# Patient Record
Sex: Female | Born: 1945 | Race: White | Hispanic: No | Smoking: Never smoker
Health system: Southern US, Community
[De-identification: ages and names within clinical notes are randomized; demographics above are authoritative.]

## PROBLEM LIST (undated history)

## (undated) DIAGNOSIS — L0390 Cellulitis, unspecified: Secondary | ICD-10-CM

## (undated) DIAGNOSIS — N189 Chronic kidney disease, unspecified: Secondary | ICD-10-CM

## (undated) DIAGNOSIS — Z8049 Family history of malignant neoplasm of other genital organs: Secondary | ICD-10-CM

## (undated) DIAGNOSIS — F419 Anxiety disorder, unspecified: Secondary | ICD-10-CM

## (undated) DIAGNOSIS — E119 Type 2 diabetes mellitus without complications: Secondary | ICD-10-CM

## (undated) DIAGNOSIS — C801 Malignant (primary) neoplasm, unspecified: Secondary | ICD-10-CM

## (undated) DIAGNOSIS — J40 Bronchitis, not specified as acute or chronic: Secondary | ICD-10-CM

## (undated) DIAGNOSIS — Z803 Family history of malignant neoplasm of breast: Secondary | ICD-10-CM

## (undated) DIAGNOSIS — I10 Essential (primary) hypertension: Secondary | ICD-10-CM

## (undated) DIAGNOSIS — D649 Anemia, unspecified: Secondary | ICD-10-CM

## (undated) DIAGNOSIS — R0600 Dyspnea, unspecified: Secondary | ICD-10-CM

## (undated) DIAGNOSIS — R011 Cardiac murmur, unspecified: Secondary | ICD-10-CM

## (undated) DIAGNOSIS — I499 Cardiac arrhythmia, unspecified: Secondary | ICD-10-CM

## (undated) DIAGNOSIS — E785 Hyperlipidemia, unspecified: Secondary | ICD-10-CM

## (undated) DIAGNOSIS — I4891 Unspecified atrial fibrillation: Secondary | ICD-10-CM

## (undated) HISTORY — DX: Family history of malignant neoplasm of other genital organs: Z80.49

## (undated) HISTORY — PX: TONSILLECTOMY AND ADENOIDECTOMY: SHX28

## (undated) HISTORY — DX: Family history of malignant neoplasm of breast: Z80.3

## (undated) HISTORY — PX: DILATION AND CURETTAGE OF UTERUS: SHX78

---

## 2004-03-18 ENCOUNTER — Ambulatory Visit (HOSPITAL_COMMUNITY): Admission: RE | Admit: 2004-03-18 | Discharge: 2004-03-18 | Payer: Self-pay | Admitting: Family Medicine

## 2011-08-24 ENCOUNTER — Ambulatory Visit: Payer: Self-pay | Admitting: Physician Assistant

## 2013-08-11 DIAGNOSIS — J40 Bronchitis, not specified as acute or chronic: Secondary | ICD-10-CM | POA: Diagnosis not present

## 2013-08-11 DIAGNOSIS — IMO0001 Reserved for inherently not codable concepts without codable children: Secondary | ICD-10-CM | POA: Diagnosis not present

## 2014-01-02 DIAGNOSIS — I10 Essential (primary) hypertension: Secondary | ICD-10-CM | POA: Diagnosis not present

## 2014-01-02 DIAGNOSIS — R609 Edema, unspecified: Secondary | ICD-10-CM | POA: Diagnosis not present

## 2014-01-02 DIAGNOSIS — J209 Acute bronchitis, unspecified: Secondary | ICD-10-CM | POA: Diagnosis not present

## 2014-01-02 DIAGNOSIS — N390 Urinary tract infection, site not specified: Secondary | ICD-10-CM | POA: Diagnosis not present

## 2014-01-02 DIAGNOSIS — IMO0001 Reserved for inherently not codable concepts without codable children: Secondary | ICD-10-CM | POA: Diagnosis not present

## 2014-01-02 DIAGNOSIS — E785 Hyperlipidemia, unspecified: Secondary | ICD-10-CM | POA: Diagnosis not present

## 2014-07-28 DIAGNOSIS — N183 Chronic kidney disease, stage 3 (moderate): Secondary | ICD-10-CM | POA: Diagnosis not present

## 2014-07-28 DIAGNOSIS — I10 Essential (primary) hypertension: Secondary | ICD-10-CM | POA: Diagnosis not present

## 2014-07-28 DIAGNOSIS — R600 Localized edema: Secondary | ICD-10-CM | POA: Diagnosis not present

## 2014-07-28 DIAGNOSIS — E1122 Type 2 diabetes mellitus with diabetic chronic kidney disease: Secondary | ICD-10-CM | POA: Diagnosis not present

## 2014-07-28 DIAGNOSIS — E780 Pure hypercholesterolemia: Secondary | ICD-10-CM | POA: Diagnosis not present

## 2014-07-28 DIAGNOSIS — E1165 Type 2 diabetes mellitus with hyperglycemia: Secondary | ICD-10-CM | POA: Diagnosis not present

## 2015-01-28 DIAGNOSIS — Z1239 Encounter for other screening for malignant neoplasm of breast: Secondary | ICD-10-CM | POA: Diagnosis not present

## 2015-01-28 DIAGNOSIS — Z1211 Encounter for screening for malignant neoplasm of colon: Secondary | ICD-10-CM | POA: Diagnosis not present

## 2015-01-28 DIAGNOSIS — E1165 Type 2 diabetes mellitus with hyperglycemia: Secondary | ICD-10-CM | POA: Diagnosis not present

## 2015-01-28 DIAGNOSIS — E780 Pure hypercholesterolemia: Secondary | ICD-10-CM | POA: Diagnosis not present

## 2015-01-28 DIAGNOSIS — I10 Essential (primary) hypertension: Secondary | ICD-10-CM | POA: Diagnosis not present

## 2015-01-28 DIAGNOSIS — N183 Chronic kidney disease, stage 3 (moderate): Secondary | ICD-10-CM | POA: Diagnosis not present

## 2015-01-28 DIAGNOSIS — Z23 Encounter for immunization: Secondary | ICD-10-CM | POA: Diagnosis not present

## 2015-03-05 ENCOUNTER — Other Ambulatory Visit (HOSPITAL_COMMUNITY): Payer: Self-pay | Admitting: Family Medicine

## 2015-03-05 DIAGNOSIS — Z1231 Encounter for screening mammogram for malignant neoplasm of breast: Secondary | ICD-10-CM

## 2015-04-16 ENCOUNTER — Ambulatory Visit (HOSPITAL_COMMUNITY)
Admission: RE | Admit: 2015-04-16 | Discharge: 2015-04-16 | Disposition: A | Discharge status: Home / Self Care | Payer: Medicare Other | Source: Ambulatory Visit | Attending: Family Medicine | Admitting: Family Medicine

## 2015-04-16 DIAGNOSIS — Z1231 Encounter for screening mammogram for malignant neoplasm of breast: Secondary | ICD-10-CM | POA: Insufficient documentation

## 2015-06-02 DIAGNOSIS — E7800 Pure hypercholesterolemia, unspecified: Secondary | ICD-10-CM | POA: Diagnosis not present

## 2015-06-02 DIAGNOSIS — E1165 Type 2 diabetes mellitus with hyperglycemia: Secondary | ICD-10-CM | POA: Diagnosis not present

## 2015-06-02 DIAGNOSIS — I10 Essential (primary) hypertension: Secondary | ICD-10-CM | POA: Diagnosis not present

## 2015-06-02 DIAGNOSIS — J209 Acute bronchitis, unspecified: Secondary | ICD-10-CM | POA: Diagnosis not present

## 2015-07-30 DIAGNOSIS — Z1211 Encounter for screening for malignant neoplasm of colon: Secondary | ICD-10-CM | POA: Diagnosis not present

## 2016-03-29 DIAGNOSIS — R600 Localized edema: Secondary | ICD-10-CM | POA: Diagnosis not present

## 2016-03-29 DIAGNOSIS — E1122 Type 2 diabetes mellitus with diabetic chronic kidney disease: Secondary | ICD-10-CM | POA: Diagnosis not present

## 2016-03-29 DIAGNOSIS — I10 Essential (primary) hypertension: Secondary | ICD-10-CM | POA: Diagnosis not present

## 2016-03-29 DIAGNOSIS — F5101 Primary insomnia: Secondary | ICD-10-CM | POA: Diagnosis not present

## 2016-03-29 DIAGNOSIS — E7800 Pure hypercholesterolemia, unspecified: Secondary | ICD-10-CM | POA: Diagnosis not present

## 2016-03-29 DIAGNOSIS — E1165 Type 2 diabetes mellitus with hyperglycemia: Secondary | ICD-10-CM | POA: Diagnosis not present

## 2016-05-18 DIAGNOSIS — J209 Acute bronchitis, unspecified: Secondary | ICD-10-CM | POA: Diagnosis not present

## 2016-05-18 DIAGNOSIS — R062 Wheezing: Secondary | ICD-10-CM | POA: Diagnosis not present

## 2016-08-17 DIAGNOSIS — E7800 Pure hypercholesterolemia, unspecified: Secondary | ICD-10-CM | POA: Diagnosis not present

## 2016-08-17 DIAGNOSIS — E1165 Type 2 diabetes mellitus with hyperglycemia: Secondary | ICD-10-CM | POA: Diagnosis not present

## 2016-08-17 DIAGNOSIS — N183 Chronic kidney disease, stage 3 (moderate): Secondary | ICD-10-CM | POA: Diagnosis not present

## 2016-08-17 DIAGNOSIS — Z7984 Long term (current) use of oral hypoglycemic drugs: Secondary | ICD-10-CM | POA: Diagnosis not present

## 2016-08-17 DIAGNOSIS — Z794 Long term (current) use of insulin: Secondary | ICD-10-CM | POA: Diagnosis not present

## 2016-08-17 DIAGNOSIS — J209 Acute bronchitis, unspecified: Secondary | ICD-10-CM | POA: Diagnosis not present

## 2016-08-17 DIAGNOSIS — I10 Essential (primary) hypertension: Secondary | ICD-10-CM | POA: Diagnosis not present

## 2016-08-18 ENCOUNTER — Ambulatory Visit
Admission: RE | Admit: 2016-08-18 | Discharge: 2016-08-18 | Disposition: A | Discharge status: Home / Self Care | Payer: Medicare Other | Source: Ambulatory Visit | Attending: Family Medicine | Admitting: Family Medicine

## 2016-08-18 ENCOUNTER — Other Ambulatory Visit: Payer: Self-pay | Admitting: Family Medicine

## 2016-08-18 DIAGNOSIS — J40 Bronchitis, not specified as acute or chronic: Secondary | ICD-10-CM

## 2016-08-18 DIAGNOSIS — R0602 Shortness of breath: Secondary | ICD-10-CM | POA: Diagnosis not present

## 2016-08-18 DIAGNOSIS — R05 Cough: Secondary | ICD-10-CM | POA: Diagnosis not present

## 2016-10-03 DIAGNOSIS — J0101 Acute recurrent maxillary sinusitis: Secondary | ICD-10-CM | POA: Diagnosis not present

## 2016-12-25 DIAGNOSIS — F5101 Primary insomnia: Secondary | ICD-10-CM | POA: Diagnosis not present

## 2016-12-25 DIAGNOSIS — E7800 Pure hypercholesterolemia, unspecified: Secondary | ICD-10-CM | POA: Diagnosis not present

## 2016-12-25 DIAGNOSIS — Z794 Long term (current) use of insulin: Secondary | ICD-10-CM | POA: Diagnosis not present

## 2016-12-25 DIAGNOSIS — E1165 Type 2 diabetes mellitus with hyperglycemia: Secondary | ICD-10-CM | POA: Diagnosis not present

## 2016-12-25 DIAGNOSIS — I10 Essential (primary) hypertension: Secondary | ICD-10-CM | POA: Diagnosis not present

## 2016-12-25 DIAGNOSIS — Z1389 Encounter for screening for other disorder: Secondary | ICD-10-CM | POA: Diagnosis not present

## 2017-03-28 DIAGNOSIS — E1165 Type 2 diabetes mellitus with hyperglycemia: Secondary | ICD-10-CM | POA: Diagnosis not present

## 2017-03-28 DIAGNOSIS — N183 Chronic kidney disease, stage 3 (moderate): Secondary | ICD-10-CM | POA: Diagnosis not present

## 2017-03-28 DIAGNOSIS — E7800 Pure hypercholesterolemia, unspecified: Secondary | ICD-10-CM | POA: Diagnosis not present

## 2017-03-28 DIAGNOSIS — I10 Essential (primary) hypertension: Secondary | ICD-10-CM | POA: Diagnosis not present

## 2017-03-28 DIAGNOSIS — F5101 Primary insomnia: Secondary | ICD-10-CM | POA: Diagnosis not present

## 2017-03-28 DIAGNOSIS — Z1211 Encounter for screening for malignant neoplasm of colon: Secondary | ICD-10-CM | POA: Diagnosis not present

## 2017-03-28 DIAGNOSIS — R600 Localized edema: Secondary | ICD-10-CM | POA: Diagnosis not present

## 2017-06-01 DIAGNOSIS — Z23 Encounter for immunization: Secondary | ICD-10-CM | POA: Diagnosis not present

## 2017-12-28 DIAGNOSIS — E7800 Pure hypercholesterolemia, unspecified: Secondary | ICD-10-CM | POA: Diagnosis not present

## 2017-12-28 DIAGNOSIS — E1165 Type 2 diabetes mellitus with hyperglycemia: Secondary | ICD-10-CM | POA: Diagnosis not present

## 2017-12-28 DIAGNOSIS — I10 Essential (primary) hypertension: Secondary | ICD-10-CM | POA: Diagnosis not present

## 2017-12-28 DIAGNOSIS — R600 Localized edema: Secondary | ICD-10-CM | POA: Diagnosis not present

## 2017-12-28 DIAGNOSIS — E1122 Type 2 diabetes mellitus with diabetic chronic kidney disease: Secondary | ICD-10-CM | POA: Diagnosis not present

## 2017-12-28 DIAGNOSIS — F5101 Primary insomnia: Secondary | ICD-10-CM | POA: Diagnosis not present

## 2017-12-28 DIAGNOSIS — N183 Chronic kidney disease, stage 3 (moderate): Secondary | ICD-10-CM | POA: Diagnosis not present

## 2018-02-13 ENCOUNTER — Inpatient Hospital Stay (HOSPITAL_COMMUNITY)
Admission: EM | Admit: 2018-02-13 | Discharge: 2018-02-15 | DRG: 603 | Disposition: A | Discharge status: Home / Self Care | Payer: Medicare Other | Attending: Internal Medicine | Admitting: Internal Medicine

## 2018-02-13 ENCOUNTER — Inpatient Hospital Stay (HOSPITAL_COMMUNITY): Payer: Medicare Other

## 2018-02-13 ENCOUNTER — Other Ambulatory Visit: Payer: Self-pay

## 2018-02-13 ENCOUNTER — Encounter (HOSPITAL_COMMUNITY): Payer: Self-pay

## 2018-02-13 DIAGNOSIS — E66813 Obesity, class 3: Secondary | ICD-10-CM

## 2018-02-13 DIAGNOSIS — Z885 Allergy status to narcotic agent status: Secondary | ICD-10-CM

## 2018-02-13 DIAGNOSIS — I272 Pulmonary hypertension, unspecified: Secondary | ICD-10-CM | POA: Diagnosis present

## 2018-02-13 DIAGNOSIS — Z6841 Body Mass Index (BMI) 40.0 and over, adult: Secondary | ICD-10-CM | POA: Diagnosis not present

## 2018-02-13 DIAGNOSIS — R000 Tachycardia, unspecified: Secondary | ICD-10-CM | POA: Diagnosis not present

## 2018-02-13 DIAGNOSIS — I4891 Unspecified atrial fibrillation: Secondary | ICD-10-CM | POA: Diagnosis present

## 2018-02-13 DIAGNOSIS — Z8249 Family history of ischemic heart disease and other diseases of the circulatory system: Secondary | ICD-10-CM | POA: Diagnosis not present

## 2018-02-13 DIAGNOSIS — I10 Essential (primary) hypertension: Secondary | ICD-10-CM | POA: Diagnosis not present

## 2018-02-13 DIAGNOSIS — Z7189 Other specified counseling: Secondary | ICD-10-CM | POA: Diagnosis not present

## 2018-02-13 DIAGNOSIS — L03115 Cellulitis of right lower limb: Principal | ICD-10-CM | POA: Diagnosis present

## 2018-02-13 DIAGNOSIS — I119 Hypertensive heart disease without heart failure: Secondary | ICD-10-CM | POA: Diagnosis present

## 2018-02-13 DIAGNOSIS — X58XXXA Exposure to other specified factors, initial encounter: Secondary | ICD-10-CM | POA: Diagnosis present

## 2018-02-13 DIAGNOSIS — Z794 Long term (current) use of insulin: Secondary | ICD-10-CM

## 2018-02-13 DIAGNOSIS — I340 Nonrheumatic mitral (valve) insufficiency: Secondary | ICD-10-CM

## 2018-02-13 DIAGNOSIS — R600 Localized edema: Secondary | ICD-10-CM

## 2018-02-13 DIAGNOSIS — E876 Hypokalemia: Secondary | ICD-10-CM | POA: Diagnosis present

## 2018-02-13 DIAGNOSIS — Z881 Allergy status to other antibiotic agents status: Secondary | ICD-10-CM | POA: Diagnosis not present

## 2018-02-13 DIAGNOSIS — E785 Hyperlipidemia, unspecified: Secondary | ICD-10-CM

## 2018-02-13 DIAGNOSIS — E782 Mixed hyperlipidemia: Secondary | ICD-10-CM | POA: Diagnosis not present

## 2018-02-13 DIAGNOSIS — Z79899 Other long term (current) drug therapy: Secondary | ICD-10-CM

## 2018-02-13 DIAGNOSIS — E871 Hypo-osmolality and hyponatremia: Secondary | ICD-10-CM | POA: Diagnosis present

## 2018-02-13 DIAGNOSIS — L03119 Cellulitis of unspecified part of limb: Secondary | ICD-10-CM | POA: Diagnosis present

## 2018-02-13 DIAGNOSIS — E1165 Type 2 diabetes mellitus with hyperglycemia: Secondary | ICD-10-CM | POA: Diagnosis present

## 2018-02-13 DIAGNOSIS — T502X5A Adverse effect of carbonic-anhydrase inhibitors, benzothiadiazides and other diuretics, initial encounter: Secondary | ICD-10-CM | POA: Diagnosis present

## 2018-02-13 HISTORY — DX: Hyperlipidemia, unspecified: E78.5

## 2018-02-13 HISTORY — DX: Essential (primary) hypertension: I10

## 2018-02-13 HISTORY — DX: Unspecified atrial fibrillation: I48.91

## 2018-02-13 HISTORY — DX: Cellulitis, unspecified: L03.90

## 2018-02-13 HISTORY — DX: Type 2 diabetes mellitus without complications: E11.9

## 2018-02-13 LAB — COMPREHENSIVE METABOLIC PANEL
ALT: 19 U/L (ref 0–44)
AST: 18 U/L (ref 15–41)
Albumin: 2.6 g/dL — ABNORMAL LOW (ref 3.5–5.0)
Alkaline Phosphatase: 142 U/L — ABNORMAL HIGH (ref 38–126)
Anion gap: 12 (ref 5–15)
BUN: 42 mg/dL — ABNORMAL HIGH (ref 8–23)
CHLORIDE: 94 mmol/L — AB (ref 98–111)
CO2: 25 mmol/L (ref 22–32)
Calcium: 8.4 mg/dL — ABNORMAL LOW (ref 8.9–10.3)
Creatinine, Ser: 1.25 mg/dL — ABNORMAL HIGH (ref 0.44–1.00)
GFR, EST AFRICAN AMERICAN: 50 mL/min — AB (ref >60)
GFR, EST NON AFRICAN AMERICAN: 43 mL/min — AB (ref >60)
Glucose, Bld: 323 mg/dL — ABNORMAL HIGH (ref 70–99)
POTASSIUM: 3.5 mmol/L (ref 3.5–5.1)
SODIUM: 131 mmol/L — AB (ref 135–145)
Total Bilirubin: 0.7 mg/dL (ref 0.3–1.2)
Total Protein: 6.6 g/dL (ref 6.5–8.1)

## 2018-02-13 LAB — CBC WITH DIFFERENTIAL/PLATELET
Basophils Absolute: 0 10*3/uL (ref 0.0–0.1)
Basophils Relative: 0 %
Eosinophils Absolute: 0 10*3/uL (ref 0.0–0.7)
Eosinophils Relative: 0 %
HEMATOCRIT: 32.8 % — AB (ref 36.0–46.0)
HEMOGLOBIN: 10.7 g/dL — AB (ref 12.0–15.0)
LYMPHS ABS: 0.9 10*3/uL (ref 0.7–4.0)
LYMPHS PCT: 4 %
MCH: 25.8 pg — AB (ref 26.0–34.0)
MCHC: 32.6 g/dL (ref 30.0–36.0)
MCV: 79 fL (ref 78.0–100.0)
MONOS PCT: 7 %
Monocytes Absolute: 1.6 10*3/uL — ABNORMAL HIGH (ref 0.1–1.0)
NEUTROS ABS: 20.8 10*3/uL — AB (ref 1.7–7.7)
NEUTROS PCT: 89 %
Platelets: 216 10*3/uL (ref 150–400)
RBC: 4.15 MIL/uL (ref 3.87–5.11)
RDW: 14.4 % (ref 11.5–15.5)
WBC: 23.3 10*3/uL — AB (ref 4.0–10.5)

## 2018-02-13 LAB — CK: CK TOTAL: 22 U/L — AB (ref 38–234)

## 2018-02-13 LAB — URINALYSIS, ROUTINE W REFLEX MICROSCOPIC
BILIRUBIN URINE: NEGATIVE
Glucose, UA: 150 mg/dL — AB
KETONES UR: NEGATIVE mg/dL
Nitrite: NEGATIVE
Protein, ur: 100 mg/dL — AB
SPECIFIC GRAVITY, URINE: 1.018 (ref 1.005–1.030)
pH: 5 (ref 5.0–8.0)

## 2018-02-13 LAB — GLUCOSE, CAPILLARY
GLUCOSE-CAPILLARY: 285 mg/dL — AB (ref 70–99)
GLUCOSE-CAPILLARY: 254 mg/dL — AB (ref 70–99)
Glucose-Capillary: 306 mg/dL — ABNORMAL HIGH (ref 70–99)

## 2018-02-13 LAB — ECHOCARDIOGRAM COMPLETE
Height: 66 in
WEIGHTICAEL: 4752 [oz_av]

## 2018-02-13 LAB — HEMOGLOBIN A1C
Hgb A1c MFr Bld: 9.7 % — ABNORMAL HIGH (ref 4.8–5.6)
Mean Plasma Glucose: 231.69 mg/dL

## 2018-02-13 LAB — LACTIC ACID, PLASMA
LACTIC ACID, VENOUS: 1.7 mmol/L (ref 0.5–1.9)
Lactic Acid, Venous: 1.5 mmol/L (ref 0.5–1.9)

## 2018-02-13 MED ORDER — POTASSIUM CHLORIDE IN NACL 20-0.9 MEQ/L-% IV SOLN
INTRAVENOUS | Status: DC
  Administered 2018-02-13 – 2018-02-14 (×2): via INTRAVENOUS

## 2018-02-13 MED ORDER — LISINOPRIL 10 MG PO TABS
20.0000 mg | ORAL_TABLET | Freq: Every day | ORAL | Status: DC
  Administered 2018-02-13 – 2018-02-15 (×3): 20 mg via ORAL
  Filled 2018-02-13 (×3): qty 2

## 2018-02-13 MED ORDER — INSULIN ASPART 100 UNIT/ML ~~LOC~~ SOLN
0.0000 [IU] | Freq: Three times a day (TID) | SUBCUTANEOUS | Status: DC
  Administered 2018-02-13: 11 [IU] via SUBCUTANEOUS
  Administered 2018-02-13: 8 [IU] via SUBCUTANEOUS

## 2018-02-13 MED ORDER — CEFAZOLIN SODIUM-DEXTROSE 1-4 GM/50ML-% IV SOLN
1.0000 g | Freq: Once | INTRAVENOUS | Status: AC
  Administered 2018-02-13: 1 g via INTRAVENOUS
  Filled 2018-02-13: qty 50

## 2018-02-13 MED ORDER — ACETAMINOPHEN 650 MG RE SUPP: 650.0000 mg | Freq: Four times a day (QID) | RECTAL | Status: DC | PRN

## 2018-02-13 MED ORDER — DILTIAZEM HCL 30 MG PO TABS
30.0000 mg | ORAL_TABLET | Freq: Four times a day (QID) | ORAL | Status: DC
  Administered 2018-02-13 – 2018-02-14 (×4): 30 mg via ORAL
  Filled 2018-02-13 (×4): qty 1

## 2018-02-13 MED ORDER — PRAVASTATIN SODIUM 10 MG PO TABS
20.0000 mg | ORAL_TABLET | Freq: Every day | ORAL | Status: DC
  Administered 2018-02-13 – 2018-02-15 (×3): 20 mg via ORAL
  Filled 2018-02-13 (×3): qty 2

## 2018-02-13 MED ORDER — INSULIN DETEMIR 100 UNIT/ML ~~LOC~~ SOLN
10.0000 [IU] | Freq: Every day | SUBCUTANEOUS | Status: DC
  Administered 2018-02-13: 10 [IU] via SUBCUTANEOUS
  Filled 2018-02-13: qty 0.1

## 2018-02-13 MED ORDER — ACETAMINOPHEN 325 MG PO TABS
650.0000 mg | ORAL_TABLET | Freq: Four times a day (QID) | ORAL | Status: DC | PRN
  Administered 2018-02-13 – 2018-02-15 (×7): 650 mg via ORAL
  Filled 2018-02-13 (×7): qty 2

## 2018-02-13 MED ORDER — ONDANSETRON HCL 4 MG PO TABS: 4.0000 mg | ORAL_TABLET | Freq: Four times a day (QID) | ORAL | Status: DC | PRN

## 2018-02-13 MED ORDER — ENOXAPARIN SODIUM 40 MG/0.4ML ~~LOC~~ SOLN
40.0000 mg | SUBCUTANEOUS | Status: DC
  Administered 2018-02-13: 40 mg via SUBCUTANEOUS
  Filled 2018-02-13: qty 0.4

## 2018-02-13 MED ORDER — INSULIN ASPART 100 UNIT/ML ~~LOC~~ SOLN
0.0000 [IU] | Freq: Every day | SUBCUTANEOUS | Status: DC
  Administered 2018-02-13: 3 [IU] via SUBCUTANEOUS

## 2018-02-13 MED ORDER — METOPROLOL TARTRATE 25 MG PO TABS
25.0000 mg | ORAL_TABLET | Freq: Two times a day (BID) | ORAL | Status: DC
  Administered 2018-02-13 – 2018-02-14 (×3): 25 mg via ORAL
  Filled 2018-02-13 (×3): qty 1

## 2018-02-13 MED ORDER — ONDANSETRON HCL 4 MG/2ML IJ SOLN: 4.0000 mg | Freq: Four times a day (QID) | INTRAMUSCULAR | Status: DC | PRN

## 2018-02-13 MED ORDER — CEFAZOLIN SODIUM-DEXTROSE 2-4 GM/100ML-% IV SOLN
2.0000 g | Freq: Three times a day (TID) | INTRAVENOUS | Status: DC
  Administered 2018-02-13 – 2018-02-15 (×7): 2 g via INTRAVENOUS
  Filled 2018-02-13 (×16): qty 100

## 2018-02-13 MED ORDER — SODIUM CHLORIDE 0.9 % IV BOLUS
1000.0000 mL | Freq: Once | INTRAVENOUS | Status: AC
  Administered 2018-02-13: 1000 mL via INTRAVENOUS

## 2018-02-13 NOTE — Progress Notes (Signed)
*  PRELIMINARY RESULTS* Echocardiogram 2D Echocardiogram has been performed.  Destiny Nash 02/13/2018, 2:17 PM

## 2018-02-13 NOTE — ED Triage Notes (Signed)
Pt family called EMS for cellulitis as well as an assisted fall due to leg weakness. Pt wanted to wait until the morning to see PCP but family urged her to come in. VSS otherwise  20g r ac

## 2018-02-13 NOTE — ED Provider Notes (Signed)
MSE was initiated and I personally evaluated the patient and placed orders (if any) at  6:17 AM on February 13, 2018.  The patient appears stable so that the remainder of the MSE may be completed by another provider.  I was not able to examine the patient well because as soon as I went in the room she had to use the bathroom and could not wait.  However she has an obviously red and inflamed right lower leg.  Initial laboratory testing was done.     Rolland Porter, MD 02/13/18 [phone]

## 2018-02-13 NOTE — ED Provider Notes (Signed)
[facility] EMERGENCY DEPARTMENT Provider Note   CSN: [id number] Arrival date & time: 02/13/18  0411     History   Chief Complaint Chief Complaint  Patient presents with  . Recurrent Skin Infections    Bilat legs    HPI Destiny Nash is a 72 y.o. female.     Pt was seen at [place]. Per pt and her family, c/o gradual onset and worsening of persistent RLE erythema for the past 6 days. Pt describes this as an acute flair of her chronic "cellulitis." Has been associated with RLE acute flair of chronic "weakness." Pt and her family state pt's "leg gets red, then gets weak" and this has been ongoing/waxing and waning for the past several years. Pt states she usually receives antibiotics by her PMD with improvement. Pt states she fell to her knees this morning due to her symptoms, and was able to stand up with her walker and EMS assistance. Denies fevers, no abd pain, no N/V/D, no neck or back pain, no headache, no syncope, no tingling/numbness in extremities, no new focal motor weakness.   Past Medical History:  Diagnosis Date  . Cellulitis   . Diabetes mellitus without complication ([place])   . Hyperlipidemia   . Hypertension     There are no active problems to display for this patient.   History reviewed. No pertinent surgical history.   OB History   None      Home Medications    Prior to Admission medications   Medication Sig Start Date End Date Taking? Authorizing Provider  doxycycline (VIBRA-TABS) 100 MG tablet Take 100 mg by mouth 2 (two) times daily. for 10 days 02/08/18   [provider]  glyBURIDE (DIABETA) 5 MG tablet Take 10 mg by mouth 2 (two) times daily. 01/22/18   [provider]  lisinopril-hydrochlorothiazide (PRINZIDE,ZESTORETIC) 20-12.5 MG tablet Take 1 tablet by mouth daily. 01/22/18   [provider]  metoprolol tartrate (LOPRESSOR) 25 MG tablet Take 25 mg by mouth 2 (two) times daily with a meal. 11/30/17   [provider]    pravastatin (PRAVACHOL) 20 MG tablet TK 1 T PO QD 01/03/18   [provider]    Family History History reviewed. No pertinent family history.  Social History Social History   Tobacco Use  . Smoking status: Never Smoker  . Smokeless tobacco: Never Used  Substance Use Topics  . Alcohol use: Not Currently  . Drug use: Not Currently     Allergies   Patient has no allergy information on record.   Review of Systems Review of Systems ROS: Statement: All systems negative except as marked or noted in the HPI; Constitutional: Negative for fever and chills. ; ; Eyes: Negative for eye pain, redness and discharge. ; ; ENMT: Negative for ear pain, hoarseness, nasal congestion, sinus pressure and sore throat. ; ; Cardiovascular: Negative for chest pain, palpitations, diaphoresis, dyspnea and peripheral edema. ; ; Respiratory: Negative for cough, wheezing and stridor. ; ; Gastrointestinal: Negative for nausea, vomiting, diarrhea, abdominal pain, blood in stool, hematemesis, jaundice and rectal bleeding. . ; ; Genitourinary: Negative for dysuria, flank pain and hematuria. ; ; Musculoskeletal: Negative for back pain and neck pain. Negative for swelling and trauma.; ; Skin: +rash. Negative for pruritus, abrasions, blisters, bruising and skin lesion.; ; Neuro: Negative for headache, lightheadedness and neck stiffness. Negative for weakness, altered level of consciousness, altered mental status, new/different extremity weakness, paresthesias, involuntary movement, seizure and syncope.  Physical Exam Updated Vital Signs BP (!) 144/77   Pulse (!) 108   Temp 98.3 F (36.8 C) (Oral)   Resp 20   Ht 5' 6"  (1.676 m)   Wt 134.7 kg   SpO2 98%   BMI 47.94 kg/m    BP (!) 144/77   Pulse (!) 108   Temp 98.3 F (36.8 C) (Oral)   Resp 20   Ht 5' 6"  (1.676 m)   Wt 134.7 kg   SpO2 98%   BMI 47.94 kg/m    Physical Exam 0720: Physical examination:  Nursing notes reviewed; Vital signs and  O2 SAT reviewed;  Constitutional: Well developed, Well nourished, Well hydrated, In no acute distress; Head:  Normocephalic, atraumatic; Eyes: EOMI, PERRL, No scleral icterus; ENMT: Mouth and pharynx normal, Mucous membranes moist; Neck: Supple, Full range of motion, No lymphadenopathy; Cardiovascular: Regular rate and rhythm, No gallop; Respiratory: Breath sounds clear & equal bilaterally, No wheezes.  Speaking full sentences with ease, Normal respiratory effort/excursion; Chest: Nontender, Movement normal; Abdomen: Soft, Large pannus. Nontender, Nondistended, Normal bowel sounds; Genitourinary: No CVA tenderness; Spine:  No midline CS, TS, LS tenderness.;; Extremities: Peripheral pulses normal, No tenderness, +2 pedal edema bilat. +right lower leg mid-tibial area erythema and induration with streaking up posterior leg. No open wounds. ; Neuro: AA&Ox3, Major CN grossly intact.  Speech clear. No gross focal motor or sensory deficits in extremities.; Skin: Color normal, Warm, Dry.   ED Treatments / Results  Labs (all labs ordered are listed, but only abnormal results are displayed)   EKG None  Radiology   Procedures Procedures (including critical care time)  Medications Ordered in ED Medications  sodium chloride 0.9 % bolus 1,000 mL (0 mLs Intravenous Stopped 02/13/18 0754)  ceFAZolin (ANCEF) IVPB 1 g/50 mL premix (0 g Intravenous Stopped 02/13/18 0714)     Initial Impression / Assessment and Plan / ED Course  I have reviewed the triage vital signs and the nursing notes.  Pertinent labs & imaging results that were available during my care of the patient were reviewed by me and considered in my medical decision making (see chart for details).  MDM Reviewed: previous chart, nursing note and vitals Reviewed previous: labs Interpretation: labs   Results for orders placed or performed during the hospital encounter of 02/13/18  Culture, blood (routine x 2)  Result Value Ref Range    Specimen Description BLOOD RIGHT ARM    Special Requests      BOTTLES DRAWN AEROBIC AND ANAEROBIC Blood Culture adequate volume Performed at [facility], [address], [place], [place] [address]    Culture PENDING    Report Status PENDING   Culture, blood (routine x 2)  Result Value Ref Range   Specimen Description BLOOD LEFT ARM    Special Requests      BOTTLES DRAWN AEROBIC ONLY Blood Culture adequate volume Performed at [address] [place], [place] [address]    Culture PENDING    Report Status PENDING   Comprehensive metabolic panel  Result Value Ref Range   Sodium 131 (L) 135 - 145 mmol/L   Potassium 3.5 3.5 - 5.1 mmol/L   Chloride 94 (L) 98 - 111 mmol/L   CO2 25 22 - 32 mmol/L   Glucose, Bld 323 (H) 70 - 99 mg/dL   BUN 42 (H) 8 - 23 mg/dL   Creatinine, Ser 1.25 (H) 0.44 - 1.00 mg/dL   Calcium 8.4 (L) 8.9 - 10.3 mg/dL  Total Protein 6.6 6.5 - 8.1 g/dL   Albumin 2.6 (L) 3.5 - 5.0 g/dL   AST 18 15 - 41 U/L   ALT 19 0 - 44 U/L   Alkaline Phosphatase 142 (H) 38 - 126 U/L   Total Bilirubin 0.7 0.3 - 1.2 mg/dL   GFR calc non Af Amer 43 (L) >60 mL/min   GFR calc Af Amer 50 (L) >60 mL/min   Anion gap 12 5 - 15  CBC with Differential  Result Value Ref Range   WBC 23.3 (H) 4.0 - 10.5 K/uL   RBC 4.15 3.87 - 5.11 MIL/uL   Hemoglobin 10.7 (L) 12.0 - 15.0 g/dL   HCT 32.8 (L) 36.0 - 46.0 %   MCV 79.0 78.0 - 100.0 fL   MCH 25.8 (L) 26.0 - 34.0 pg   MCHC 32.6 30.0 - 36.0 g/dL   RDW 14.4 11.5 - 15.5 %   Platelets 216 150 - 400 K/uL   Neutrophils Relative % 89 %   Neutro Abs 20.8 (H) 1.7 - 7.7 K/uL   Lymphocytes Relative 4 %   Lymphs Abs 0.9 0.7 - 4.0 K/uL   Monocytes Relative 7 %   Monocytes Absolute 1.6 (H) 0.1 - 1.0 K/uL   Eosinophils Relative 0 %   Eosinophils Absolute 0.0 0.0 - 0.7 K/uL   Basophils Relative 0 %   Basophils Absolute 0.0 0.0 - 0.1 K/uL  Lactic acid, plasma  Result Value Ref Range   Lactic Acid, Venous 1.7 0.5 - 1.9 mmol/L    0820:   BUN/Cr elevated, no old to compare. CBG elevated but AG normal. IV abx started after [facility] x2. Dx and testing d/w pt and family.  Questions answered.  Verb understanding, agreeable to admit.  T/C returned from [facility]. Carles Collet, case discussed, including:  HPI, pertinent PM/SHx, VS/PE, dx testing, ED course and treatment:  Agreeable to admit.     Final Clinical Impressions(s) / ED Diagnoses   Final diagnoses:  None    ED Discharge Orders    None       Francine Graven, DO 02/17/18 1313

## 2018-02-13 NOTE — Progress Notes (Signed)
Personally reviewed EKG -Afib with HR 109 with nonspecific T wave changes -place on tele -check Echo -check TSH, Free T4 -start diltiazem -with new diagnosis of Afib-->consult cardiology -CHADSVASc at least 4  DTat

## 2018-02-13 NOTE — ED Triage Notes (Signed)
Pt noticed the cellulitis Friday morning.

## 2018-02-13 NOTE — ED Notes (Signed)
Pt took home tylenol

## 2018-02-13 NOTE — Progress Notes (Addendum)
Patient admitted from ED to room 307, report received from  [facility] RN. Patient alert and oriented, MD aware of patient's arrival to floor. Placed on telemetry box 18. Family at bedside .Will continue to monitor patient.

## 2018-02-13 NOTE — H&P (Signed)
History and Physical  Destiny Nash [id number] DOB: 11-Mar-1946 DOA: 02/13/2018   PCP: [name], MD   Patient coming from: Home  Chief Complaint: leg pain  HPI:  Destiny Nash is a 72 y.o. female with medical history of HTN, DM2, HLD presented with right leg pain, erythema and edema since 02/07/18.  She denies any recent injury or trauma to the leg, although she has felt some heaviness in the right leg resulting into mechanical falls since 02/07/2018.  The patient has some subjective fevers and chills on 02/07/2018.  The patient called her primary care provider on 02/08/2018, and she was called in a prescription for doxycycline which she has been taking uppercase for this point.  She has had some nausea without emesis patient denies any chest pain, shortness breath, cough, hemoptysis, vomiting, diarrhea, dysuria, hematuria.  The patient states that she has cellulitis in the right lower extremity approximately 2 years prior to this admission, but did not require admission at that time.  Since starting doxycycline on 02/08/2018, she has noted continued erythema streaking up the posterior aspect of her calf to her thigh.  As result, she came to the emergency department for further evaluation. In the emergency department, the patient was afebrile hemodynamically stable saturating 100% room air.  BMP showed a sodium 131, potassium 3.5, serum creatinine 1.25.  WBC was 23.3.  Chest x-ray shows small bilateral pleural effusions, without infiltrates.  Lactic acid was 1.7.  Patient was started on cefazolin.  Assessment/Plan: Cellulitis right lower extremity -The patient has failed outpatient antibiotics -Continue cefazolin IV -Right lower extremity duplex -Check CPK  Diabetes mellitus type 2, uncontrolled with hyperglycemia -Holding glyburide and metformin -Continue reduced dose Levemir -Check hemoglobin A1c -NovoLog sliding scale  Essential hypertension -Continue lisinopril and  metoprolol tartrate -Holding HCTZ secondary to hyponatremia  Hyponatremia -Likely due to HCTZ -IV fluids x24 hours  Hyperlipidemia -continue statin  Morbid obesity -BMI 47.94 -Lifestyle modification      Past Medical History:  Diagnosis Date  . Cellulitis   . Diabetes mellitus without complication ([place])   . Hyperlipidemia   . Hypertension   Surgical History reviewed. No pertinent surgical history. Social History:  reports that she has never smoked. She has never used smokeless tobacco. She reports that she drank alcohol. She reports that she has current or past drug history.  Family History reviewed. No pertinent family history.     Prior to Admission medications   Medication Sig Start Date End Date Taking? Authorizing Provider  doxycycline (VIBRA-TABS) 100 MG tablet Take 100 mg by mouth 2 (two) times daily. for 10 days 02/08/18   [provider]  glyBURIDE (DIABETA) 5 MG tablet Take 10 mg by mouth 2 (two) times daily. 01/22/18   [provider]  lisinopril-hydrochlorothiazide (PRINZIDE,ZESTORETIC) 20-12.5 MG tablet Take 1 tablet by mouth daily. 01/22/18   [provider]  metoprolol tartrate (LOPRESSOR) 25 MG tablet Take 25 mg by mouth 2 (two) times daily with a meal. 11/30/17   [provider]  pravastatin (PRAVACHOL) 20 MG tablet TK 1 T PO QD 01/03/18   [provider]    Review of Systems:  Constitutional:  No weight loss, night sweats, Fevers, chills, fatigue.  Head&Eyes: No headache.  No vision loss.  No eye pain or scotoma ENT:  No Difficulty swallowing,Tooth/dental problems,Sore throat,  No ear ache, post nasal drip,  Cardio-vascular:  No chest pain, Orthopnea, PND, swelling in lower extremities,  dizziness, palpitations  GI:  No  abdominal pain, nausea, vomiting, diarrhea, loss of appetite, hematochezia, melena, heartburn, indigestion, Resp:  No shortness of breath with exertion or at rest. No cough. No coughing up  of blood .No wheezing.No chest wall deformity  Skin:  no rash or lesions.  GU:  no dysuria, change in color of urine, no urgency or frequency. No flank pain.  Musculoskeletal:  No joint pain or swelling. No decreased range of motion. No back pain.  Psych:  No change in mood or affect. No depression or anxiety. Neurologic: No headache, no dysesthesia, no focal weakness, no vision loss. No syncope  Physical Exam: Vitals:   02/13/18 0430 02/13/18 0500 02/13/18 0530 02/13/18 0600  BP: (!) 144/63 (!) 149/86 (!) 153/83 (!) 144/77  Pulse:      Resp:      Temp:      TempSrc:      SpO2:      Weight:      Height:       General:  A&O x 3, NAD, nontoxic, pleasant/cooperative Head/Eye: No conjunctival hemorrhage, no icterus, [place]/AT, No nystagmus ENT:  No icterus,  No thrush, good dentition, no pharyngeal exudate Neck:  No masses, no lymphadenpathy, no bruits CV:  irregular, no rub, no gallop, no S3 Lung:  CTAB, good air movement, no wheeze, no rhonchi Abdomen: soft/NT, +BS, nondistended, no peritoneal signs Ext: No cyanosis, No rashes, No petechiae,3 + RLE edema with erythema in pretibial area streaking up to posterior thigh           Labs on Admission:  Basic Metabolic Panel: Recent Labs  Lab 02/13/18 0650  NA 131*  K 3.5  CL 94*  CO2 25  GLUCOSE 323*  BUN 42*  CREATININE 1.25*  CALCIUM 8.4*   Liver Function Tests: Recent Labs  Lab 02/13/18 0650  AST 18  ALT 19  ALKPHOS 142*  BILITOT 0.7  PROT 6.6  ALBUMIN 2.6*   No results for input(s): LIPASE, AMYLASE in the last 168 hours. No results for input(s): AMMONIA in the last 168 hours. CBC: Recent Labs  Lab 02/13/18 0650  WBC 23.3*  NEUTROABS 20.8*  HGB 10.7*  HCT 32.8*  MCV 79.0  PLT 216   Coagulation Profile: No results for input(s): INR, PROTIME in the last 168 hours. Cardiac Enzymes: No results for input(s): CKTOTAL, CKMB, CKMBINDEX, TROPONINI in the last 168 hours. BNP: Invalid input(s):  POCBNP CBG: No results for input(s): GLUCAP in the last 168 hours. Urine analysis: No results found for: COLORURINE, APPEARANCEUR, LABSPEC, PHURINE, GLUCOSEU, HGBUR, BILIRUBINUR, [place], [place], UROBILINOGEN, NITRITE, LEUKOCYTESUR Sepsis Labs: @LABRCNTIP (procalcitonin:4,lacticidven:4) ) Recent Results (from the past 240 hour(s))  Culture, blood (routine x 2)     Status: None (Preliminary result)   Collection Time: 02/13/18  6:50 AM  Result Value Ref Range Status   Specimen Description BLOOD RIGHT ARM  Final   Special Requests   Final    BOTTLES DRAWN AEROBIC AND ANAEROBIC Blood Culture adequate volume Performed at [facility], [address], [place], [place] [address]    Culture PENDING  Incomplete   Report Status PENDING  Incomplete  Culture, blood (routine x 2)     Status: None (Preliminary result)   Collection Time: 02/13/18  6:50 AM  Result Value Ref Range Status   Specimen Description BLOOD LEFT ARM  Final   Special Requests   Final    BOTTLES DRAWN AEROBIC ONLY Blood Culture adequate volume Performed at [facility], [address],  [place], [place] [address]    Culture PENDING  Incomplete   Report Status PENDING  Incomplete     Radiological Exams on Admission: No results found.  EKG: Independently reviewed. pending    Time spent:60 minutes Code Status:   FULL Family Communication:  Daughter at bedside Disposition Plan: expect 2-3 day hospitalization Consults called: none DVT Prophylaxis: [place] Heparin    Orson Eva, DO  [facility] Pager [phone]  If 7PM-7AM, please contact night-coverage [email or web address] Password [email or web address] 02/13/2018, 8:30 AM

## 2018-02-13 NOTE — ED Notes (Signed)
Pt cannot provide urine sample at this time.

## 2018-02-14 ENCOUNTER — Encounter (HOSPITAL_COMMUNITY): Payer: Self-pay | Admitting: Student

## 2018-02-14 ENCOUNTER — Inpatient Hospital Stay (HOSPITAL_COMMUNITY): Payer: Medicare Other

## 2018-02-14 DIAGNOSIS — I4891 Unspecified atrial fibrillation: Secondary | ICD-10-CM

## 2018-02-14 LAB — BASIC METABOLIC PANEL
ANION GAP: 10 (ref 5–15)
BUN: 34 mg/dL — ABNORMAL HIGH (ref 8–23)
CALCIUM: 8.4 mg/dL — AB (ref 8.9–10.3)
CO2: 24 mmol/L (ref 22–32)
Chloride: 101 mmol/L (ref 98–111)
Creatinine, Ser: 0.92 mg/dL (ref 0.44–1.00)
GFR calc Af Amer: >60 mL/min (ref >60)
GLUCOSE: 183 mg/dL — AB (ref 70–99)
Potassium: 3.5 mmol/L (ref 3.5–5.1)
Sodium: 135 mmol/L (ref 135–145)

## 2018-02-14 LAB — T4, FREE: Free T4: 1.66 ng/dL (ref 0.82–1.77)

## 2018-02-14 LAB — GLUCOSE, CAPILLARY
GLUCOSE-CAPILLARY: 182 mg/dL — AB (ref 70–99)
Glucose-Capillary: 201 mg/dL — ABNORMAL HIGH (ref 70–99)
Glucose-Capillary: 163 mg/dL — ABNORMAL HIGH (ref 70–99)
Glucose-Capillary: 200 mg/dL — ABNORMAL HIGH (ref 70–99)

## 2018-02-14 LAB — TSH: TSH: 1.862 u[IU]/mL (ref 0.350–4.500)

## 2018-02-14 LAB — CBC
HEMATOCRIT: 32.8 % — AB (ref 36.0–46.0)
Hemoglobin: 10.7 g/dL — ABNORMAL LOW (ref 12.0–15.0)
MCH: 25.7 pg — ABNORMAL LOW (ref 26.0–34.0)
MCHC: 32.6 g/dL (ref 30.0–36.0)
MCV: 78.8 fL (ref 78.0–100.0)
Platelets: 235 10*3/uL (ref 150–400)
RBC: 4.16 MIL/uL (ref 3.87–5.11)
RDW: 14.7 % (ref 11.5–15.5)
WBC: 18.6 10*3/uL — ABNORMAL HIGH (ref 4.0–10.5)

## 2018-02-14 LAB — MAGNESIUM: Magnesium: 1.6 mg/dL — ABNORMAL LOW (ref 1.7–2.4)

## 2018-02-14 LAB — HIV ANTIBODY (ROUTINE TESTING W REFLEX): HIV SCREEN 4TH GENERATION: NONREACTIVE

## 2018-02-14 LAB — BRAIN NATRIURETIC PEPTIDE: B Natriuretic Peptide: 406 pg/mL — ABNORMAL HIGH (ref 0.0–100.0)

## 2018-02-14 MED ORDER — INSULIN ASPART 100 UNIT/ML ~~LOC~~ SOLN
0.0000 [IU] | Freq: Three times a day (TID) | SUBCUTANEOUS | Status: DC
  Administered 2018-02-14: 7 [IU] via SUBCUTANEOUS
  Administered 2018-02-14 – 2018-02-15 (×5): 4 [IU] via SUBCUTANEOUS

## 2018-02-14 MED ORDER — INSULIN DETEMIR 100 UNIT/ML ~~LOC~~ SOLN
20.0000 [IU] | Freq: Every day | SUBCUTANEOUS | Status: DC
  Administered 2018-02-14: 20 [IU] via SUBCUTANEOUS
  Filled 2018-02-14 (×4): qty 0.2

## 2018-02-14 MED ORDER — INSULIN ASPART 100 UNIT/ML ~~LOC~~ SOLN: 0.0000 [IU] | Freq: Every day | SUBCUTANEOUS | Status: DC

## 2018-02-14 MED ORDER — APIXABAN 5 MG PO TABS
5.0000 mg | ORAL_TABLET | Freq: Two times a day (BID) | ORAL | Status: DC
  Administered 2018-02-14 – 2018-02-15 (×3): 5 mg via ORAL
  Filled 2018-02-14 (×3): qty 1

## 2018-02-14 MED ORDER — MAGNESIUM SULFATE 2 GM/50ML IV SOLN
2.0000 g | Freq: Once | INTRAVENOUS | Status: AC
  Administered 2018-02-14: 2 g via INTRAVENOUS
  Filled 2018-02-14: qty 50

## 2018-02-14 MED ORDER — DILTIAZEM HCL ER COATED BEADS 180 MG PO CP24
180.0000 mg | ORAL_CAPSULE | Freq: Every day | ORAL | Status: DC
  Administered 2018-02-14 – 2018-02-15 (×2): 180 mg via ORAL
  Filled 2018-02-14 (×2): qty 1

## 2018-02-14 MED ORDER — SODIUM CHLORIDE 0.9 % IV SOLN
INTRAVENOUS | Status: DC
  Administered 2018-02-14: 16:00:00 via INTRAVENOUS

## 2018-02-14 NOTE — Progress Notes (Addendum)
PROGRESS NOTE  Destiny Nash [id number] DOB: [birth year] DOA: 02/13/2018 PCP: [name], MD  Brief History:   72 y.o. female with medical history of HTN, DM2, HLD presented with right leg pain, erythema and edema since 02/07/18.  She denies any recent injury or trauma to the leg, although she has felt some heaviness in the right leg resulting into mechanical falls since 02/07/2018.  The patient has some subjective fevers and chills on 02/07/2018.  The patient called her primary care provider on 02/08/2018, and she was called in a prescription for doxycycline which she has been taking until the time of admission. Since starting doxycycline on 02/08/2018, she has noted continued erythema streaking up the posterior aspect of her calf to her thigh.  As result, she came to the emergency department for further evaluation.  Since admission, the pt was noted to be in atrial fibrillation, initially with RVR.  She was started diltiazem and echo was ordered.  She was started on cefazolin for her cellulitis.  Assessment/Plan: Cellulitis right lower extremity -The patient has failed outpatient antibiotics -Continue cefazolin IV -Right lower extremity duplex -Check CPK--22 -am CBC  New Onset Atrial Fibrilliation -change diltiazem to diltiazem CD 180 mg daily -continue metoprolol -CHADSVASc 4 (HTN, Age, DM, female) -Appreciate cardiology consult -Echo--EF 55-60%, no WMA, PASP 65 -personally reviewed EKG--afib, nonspecific T wave changes -start apixaban  Diabetes mellitus type 2, uncontrolled with hyperglycemia -Holding glyburide and metformin -Continue Levemir--increase dose to 20 units at hs -Check hemoglobin A1c--9.7 -NovoLog sliding scale  Pulmonary HTN -outpt sleep study  Essential hypertension -Continue lisinopril and metoprolol tartrate -Holding HCTZ secondary to hyponatremia  Hyponatremia -Likely due to HCTZ -IV fluids x24 hours -improved  Hyperlipidemia -continue  statin  Morbid obesity -BMI 47.94 -Lifestyle modification  Hypomagnesemia -replete    Disposition Plan:   Home 9/13 or 9/14 Family Communication:   Family at bedside  Consultants:  cardiology  Code Status:  FULL   DVT Prophylaxis: apixaban   Procedures: As Listed in Progress Note Above  Antibiotics: Cefazolin 9/11>>>    Subjective: Pt feeling better but still with mild pain in leg. Erythema with mild improvement.  Denies cp, sob, n/v/d abd pain, dizziness, headache, f/c.  Objective: Vitals:   02/13/18 1415 02/13/18 1712 02/13/18 2118 02/14/18 0605  BP: (!) 147/68  (!) 150/67 (!) 150/96  Pulse: 77 (!) 104 76 96  Resp: 18  19 18   Temp: 97.8 F (36.6 C)  (!) 97.5 F (36.4 C) 98.4 F (36.9 C)  TempSrc: Oral  Oral Oral  SpO2: 97%  100% 99%  Weight:      Height:        Intake/Output Summary (Last 24 hours) at 02/14/2018 0805 Last data filed at 02/14/2018 2725 Gross per 24 hour  Intake 1536.13 ml  Output -  Net 1536.13 ml   Weight change:  Exam:   General:  Pt is alert, follows commands appropriately, not in acute distress  HEENT: No icterus, No thrush, No neck mass, [place]/AT  Cardiovascular: IRRR, S1/S2, no rubs, no gallops  Respiratory: CTA bilaterally, no wheezing, no crackles, no rhonchi  Abdomen: Soft/+BS, non tender, non distended, no guarding  Extremities: 2+RLE edema, No lymphangitis, No petechiae, No rashes, no synovitis         Data Reviewed: I have personally reviewed following labs and imaging studies Basic Metabolic Panel: Recent Labs  Lab 02/13/18 0650 02/14/18 0532  NA 131* 135  K  3.5 3.5  CL 94* 101  CO2 25 24  GLUCOSE 323* 183*  BUN 42* 34*  CREATININE 1.25* 0.92  CALCIUM 8.4* 8.4*   Liver Function Tests: Recent Labs  Lab 02/13/18 0650  AST 18  ALT 19  ALKPHOS 142*  BILITOT 0.7  PROT 6.6  ALBUMIN 2.6*   No results for input(s): LIPASE, AMYLASE in the last 168 hours. No results for input(s): AMMONIA in the  last 168 hours. Coagulation Profile: No results for input(s): INR, PROTIME in the last 168 hours. CBC: Recent Labs  Lab 02/13/18 0650 02/14/18 0532  WBC 23.3* 18.6*  NEUTROABS 20.8*  --   HGB 10.7* 10.7*  HCT 32.8* 32.8*  MCV 79.0 78.8  PLT 216 235   Cardiac Enzymes: Recent Labs  Lab 02/13/18 0956  CKTOTAL 22*   BNP: Invalid input(s): POCBNP CBG: Recent Labs  Lab 02/13/18 1209 02/13/18 1547 02/13/18 2034 02/14/18 0747  GLUCAP 285* 306* 254* 182*   HbA1C: Recent Labs    02/13/18 0650  HGBA1C 9.7*   Urine analysis:    Component Value Date/Time   COLORURINE AMBER (A) 02/13/2018 0620   APPEARANCEUR HAZY (A) 02/13/2018 0620   LABSPEC 1.018 02/13/2018 0620   PHURINE 5.0 02/13/2018 0620   GLUCOSEU 150 (A) 02/13/2018 0620   HGBUR SMALL (A) 02/13/2018 0620   BILIRUBINUR NEGATIVE 02/13/2018 0620   KETONESUR NEGATIVE 02/13/2018 0620   PROTEINUR 100 (A) 02/13/2018 0620   NITRITE NEGATIVE 02/13/2018 0620   LEUKOCYTESUR TRACE (A) 02/13/2018 0620   Sepsis Labs: @LABRCNTIP (procalcitonin:4,lacticidven:4) ) Recent Results (from the past 240 hour(s))  Culture, blood (routine x 2)     Status: None (Preliminary result)   Collection Time: 02/13/18  6:50 AM  Result Value Ref Range Status   Specimen Description BLOOD RIGHT ARM  Final   Special Requests   Final    BOTTLES DRAWN AEROBIC AND ANAEROBIC Blood Culture adequate volume   Culture   Final    NO GROWTH < 12 HOURS Performed at [facility], [address] [place], [place] [address]    Report Status PENDING  Incomplete  Culture, blood (routine x 2)     Status: None (Preliminary result)   Collection Time: 02/13/18  6:50 AM  Result Value Ref Range Status   Specimen Description BLOOD LEFT ARM  Final   Special Requests   Final    BOTTLES DRAWN AEROBIC ONLY Blood Culture adequate volume   Culture   Final    NO GROWTH < 12 HOURS Performed at [facility], [address], [place], [place] [address]    Report Status  PENDING  Incomplete     Scheduled Meds: . diltiazem  30 mg Oral Q6H  . enoxaparin (LOVENOX) injection  40 mg Subcutaneous Q24H  . insulin aspart  0-20 Units Subcutaneous TID WC  . insulin aspart  0-5 Units Subcutaneous QHS  . insulin detemir  20 Units Subcutaneous QHS  . lisinopril  20 mg Oral Daily  . metoprolol tartrate  25 mg Oral BID WC  . pravastatin  20 mg Oral q1800   Continuous Infusions: . 0.9 % NaCl with KCl 20 mEq / L Stopped (02/14/18 3009)  .  ceFAZolin (ANCEF) IV 200 mL/hr at 02/14/18 0618    Procedures/Studies: No results found.  Orson Eva, DO  [facility] Pager [phone]  If 7PM-7AM, please contact night-coverage [email or web address] Password [email or web address] 02/14/2018, 8:05 AM   LOS: 1 day

## 2018-02-14 NOTE — Plan of Care (Signed)
Nutrition Education Note  RD consulted for nutrition education regarding diabetes.  Spoke with pt at bedside. Pt was tearful regarding newly diagnosed "heart problems." RD offered to come back later for education, but pt stated it was fine to do education now. Pt states, "you probably won't tell me anything I don't already know, but you will tell me things I don't do." Pt shares that her husband also has diabetes and does not following a diabetic diet which at times makes it difficult for her.  Pt states that she does not usually eat 3 meals daily which she knows can have affects on her blood sugar because she eats more when she does eat. Pt reports a typical breakfast may include a gravy biscuit that her husband picks up from a fast food restaurant. Pt reports that recently she had a lean steak and sliced tomatoes for dinner. Pt reports that if she is cooking, she typically makes 2 vegetables at night.  Pt states that she snacks on microwave popcorn at night. Discussed importance of pairing carbohydrate foods with a protein food.  Lab Results  Component Value Date   HGBA1C 9.7 (H) 02/13/2018    RD provided "Carbohydrate Counting for People with Diabetes" handout from the Academy of Nutrition and Dietetics. Discussed different food groups and their effects on blood sugar, emphasizing carbohydrate-containing foods. Provided list of carbohydrates and recommended serving sizes of common foods.  Discussed importance of controlled and consistent carbohydrate intake throughout the day. Provided examples of ways to balance meals/snacks and encouraged intake of high-fiber, whole grain complex carbohydrates. Teach back method used.  Expect good compliance.  Body mass index is 47.94 kg/m. Pt meets criteria for morbid obesity based on current BMI.  Current diet order is Carb Modified, patient is consuming approximately 100% of meals at this time. Labs and medications reviewed. No further nutrition  interventions warranted at this time. RD contact information provided. If additional nutrition issues arise, please re-consult RD.   Gaynell Face, MS, RD, LDN Pager: [phone] Weekend/After Hours: [phone]

## 2018-02-14 NOTE — Progress Notes (Signed)
Inpatient Diabetes Program Recommendations  AACE/ADA: New Consensus Statement on Inpatient Glycemic Control (2019)  Target Ranges:  Prepandial:   less than 140 mg/dL      Peak postprandial:   less than 180 mg/dL (1-2 hours)      Critically ill patients:  140 - 180 mg/dL  Results for Destiny Nash, BOHAC" (MRN [id number]) as of 02/14/2018 11:25  Ref. Range 02/13/2018 12:09 02/13/2018 15:47 02/13/2018 20:34 02/14/2018 07:47  Glucose-Capillary Latest Ref Range: 70 - 99 mg/dL 285 (H)  Novolog 8 units 306 (H).  Novolog 11 units 254 (H)  Novolog 3 units  Levemir 10 units 182 (H)  Novolog 4 units   Results for Destiny Nash, ARMON" (MRN [id number]) as of 02/14/2018 11:25  Ref. Range 02/13/2018 06:50  Hemoglobin A1C Latest Ref Range: 4.8 - 5.6 % 9.7 (H)   Review of Glycemic Control  Diabetes history: DM2 Outpatient Diabetes medications: Levemir 14 units, Metformin 500 mg BID, Glyburide 10 mg BID Current orders for Inpatient glycemic control: Levemir 20 units QHS, Novolog 0-20 units TID with meals, Novolog 0-5 units QHS  Inpatient Diabetes Program Recommendations:  Insulin-Basal: Noted Levemir increased from 10 to 20 units today.  Increasing Levemir from 10 to 20 units may be too much since fasting glucose 182 mg/dl today. May want to consider decreasing Levemir to 12 units QHS and order Novolog meal coverage to cover post prandial hyperglycemia. Insulin-Meal Coverage: Post prandial glucose is consistently elevated. Please consider ordering Novolog 4 units TID with meals for meal coverage. A1C: A1C 9.7% on 02/13/18 indicating an average glucose of 232 mg/dl. Patient reports A1C was 8.6% in July 2019. MD, may want to consider adjusting outpatient DM medications at time of discharge and have patient follow up with PCP regarding DM control.  NOTE: Spoke with patient over phone (Diabetes Coordinator working from [employer] today) about diabetes and home regimen for diabetes control. Patient  reports that she is followed by PCP for diabetes management and currently she takes Levemir 14 units, Metformin 500 mg BID, Glyburide 10 mg BID as an outpatient for diabetes control. Patient reports that she is taking DM medications  as prescribed and that she last seen PCP in July.  Patient states that she is not checking glucose at home at all but reports that she has everything she needs for glucose monitoring at home.   Inquired about prior A1C and patient reports that her last A1C value was 8.6% in July 2019.  Discussed A1C results (9.7% on 02/13/2018) and explained that her current A1C indicates an average glucose of 232 mg/dl over the past 2-3 months. Discussed glucose and A1C goals. Discussed importance of checking CBGs and maintaining good CBG control to prevent long-term and short-term complications.  Discussed impact of nutrition, exercise, stress, sickness, and medications on diabetes control. Patient states that she feels she does a good job at following carb modified diet and is not aware of any changes over the past few months that would be contributing to elevated A1C.  Encouraged patient to check her glucose at least 2 times per day and to keep a log book of glucose readings and DM medications taken which she will need to take to doctor appointments. Explained how the glucose data would help her PCP make adjustments with DM medications.  Patient verbalized understanding of information discussed and she states that she has no further questions at this time related to diabetes.  Thanks, Barnie Alderman, RN, MSN, CDE Diabetes Coordinator Inpatient Diabetes  Program [phone] Development worker, international aid)

## 2018-02-14 NOTE — Care Management (Signed)
No Medication coverage.

## 2018-02-14 NOTE — Consult Note (Addendum)
Cardiology Consult    Patient ID: [name]; [id number]; 04-06-1946   Admit date: 02/13/2018 Date of Consult: 02/14/2018  Primary Care Provider: [name], MD Primary Cardiologist: New to [facility] - Dr. Harl Bowie  Patient Profile    Destiny Nash is a 72 y.o. female with past medical history of HTN, HLD, and Type 2 DM who is being seen today for the evaluation of new-onset atrial fibrillation at the request of Dr. Carles Collet.   History of Present Illness    Destiny Nash presented to [facility] ED on 02/13/2018 for evaluation of worsening erythema and weakness along her right lower extremity over the past week. She had been treated by her PCP with Doxycycline approximately a week ago but symptoms did not improve. Due to her weakness, she also experienced a fall which prompted her to come to the ED.   She denies any recent chest pain, dyspnea on exertion, or palpitations. No recent orthopnea or PND. Has been experiencing lower extremity edema with subjective fever and chills over the past week.   Initial labs showed WBC 23.3, Hgb 10.7, platelets 216, Na+ 131, K+ 3.5, and creatinine 1.25. Hgb A1c elevated at 9.7. TSH 1.862. Blood cultures pending. EKG shows atrial fibrillation, HR 109, with T wave inversion along the lateral leads. No prior tracings are available for comparison.  She was admitted for further treatment of her right lower extremity cellulitis and has been started on IV Cefazolin. For atrial fibrillation, she was continued on Lopressor 25 mg twice daily and started on Cardizem 30 mg Q6H for rate-control. An echocardiogram was obtained and shows a preserved EF of 55 to 60%, moderate LVH, no regional wall motion abnormalities, mild MR, and PA peak pressure is elevated to 65 mm Hg.   She denies any known history of atrial fibrillation or flutter. Says she was told 30+ years ago she had an irregular heart beat which was thought to be secondary to significant caffeine intake. No known  family history of cardiac arrhythmias. Her brother does have known CAD. She denies any alcohol use or tobacco use.    Past Medical History:  Diagnosis Date  . Atrial fibrillation ([place])    a. diagnosed in 02/2018.  Marland Kitchen Cellulitis   . Diabetes mellitus without complication ([place])   . Hyperlipidemia   . Hypertension     History reviewed. No pertinent surgical history.   Home Medications:  Prior to Admission medications   Medication Sig Start Date End Date Taking? Authorizing Provider  glyBURIDE (DIABETA) 5 MG tablet Take 10 mg by mouth 2 (two) times daily. 01/22/18  Yes [provider]  insulin detemir (LEVEMIR) 100 UNIT/ML injection Inject 14 Units into the skin daily.   Yes [provider]  lisinopril-hydrochlorothiazide (PRINZIDE,ZESTORETIC) 20-12.5 MG tablet Take 1 tablet by mouth daily. 01/22/18  Yes [provider]  metFORMIN (GLUCOPHAGE) 500 MG tablet Take 500 mg by mouth 2 (two) times daily with a meal.   Yes [provider]  metoprolol tartrate (LOPRESSOR) 25 MG tablet Take 25 mg by mouth 2 (two) times daily with a meal. 11/30/17  Yes [provider]  pravastatin (PRAVACHOL) 20 MG tablet TK 1 T PO QD 01/03/18  Yes [provider]  doxycycline (VIBRA-TABS) 100 MG tablet Take 100 mg by mouth 2 (two) times daily. for 10 days 02/08/18   [provider]    Inpatient Medications: Scheduled Meds: . diltiazem  180 mg Oral Daily  . enoxaparin (LOVENOX) injection  40 mg Subcutaneous Q24H  . insulin aspart  0-20 Units Subcutaneous TID WC  . insulin aspart  0-5 Units Subcutaneous QHS  . insulin detemir  20 Units Subcutaneous QHS  . lisinopril  20 mg Oral Daily  . metoprolol tartrate  25 mg Oral BID WC  . pravastatin  20 mg Oral q1800   Continuous Infusions: .  ceFAZolin (ANCEF) IV 200 mL/hr at 02/14/18 0618  . magnesium sulfate 1 - 4 g bolus IVPB     PRN Meds: acetaminophen **OR** acetaminophen, ondansetron **OR** ondansetron  (ZOFRAN) IV  Allergies:    Allergies  Allergen Reactions  . Codeine Other (See Comments)    Severe headache  . Levaquin [Levofloxacin] Other (See Comments)    Joint "seize" and constipation    Social History:   Social History   Socioeconomic History  . Marital status: Married    Spouse name: Not on file  . Number of children: Not on file  . Years of education: Not on file  . Highest education level: Not on file  Occupational History  . Not on file  Social Needs  . Financial resource strain: Not on file  . Food insecurity:    Worry: Not on file    Inability: Not on file  . Transportation needs:    Medical: Not on file    Non-medical: Not on file  Tobacco Use  . Smoking status: Never Smoker  . Smokeless tobacco: Never Used  Substance and Sexual Activity  . Alcohol use: Not Currently  . Drug use: Not Currently  . Sexual activity: Not Currently  Lifestyle  . Physical activity:    Days per week: Not on file    Minutes per session: Not on file  . Stress: Not on file  Relationships  . Social connections:    Talks on phone: Not on file    Gets together: Not on file    Attends religious service: Not on file    Active member of club or organization: Not on file    Attends meetings of clubs or organizations: Not on file    Relationship status: Not on file  . Intimate partner violence:    Fear of current or ex partner: Not on file    Emotionally abused: Not on file    Physically abused: Not on file    Forced sexual activity: Not on file  Other Topics Concern  . Not on file  Social History Narrative  . Not on file     Family History:    Family History  Problem Relation Age of Onset  . CAD Brother     Review of Systems    General:  No chills, fever, night sweats or weight changes.  Cardiovascular:  No chest pain, dyspnea on exertion, edema, orthopnea, palpitations, paroxysmal nocturnal dyspnea. Dermatological: No lesions/masses. Positive for right lower  extremity erythema.  Respiratory: No cough, dyspnea Urologic: No hematuria, dysuria Abdominal:   No nausea, vomiting, diarrhea, bright red blood per rectum, melena, or hematemesis Neurologic:  No visual changes, wkns, changes in mental status. All other systems reviewed and are otherwise negative except as noted above.  Physical Exam/Data    Vitals:   02/13/18 1712 02/13/18 2118 02/14/18 0605 02/14/18 0825  BP:  (!) 150/67 (!) 150/96 (!) 170/91  Pulse: (!) 104 76 96 97  Resp:  19 18   Temp:  (!) 97.5 F (36.4 C) 98.4 F (36.9 C)   TempSrc:  Oral Oral   SpO2:  100% 99%   Weight:      Height:        Intake/Output Summary (Last 24 hours) at 02/14/2018 0841 Last data filed at 02/14/2018 0618 Gross per 24 hour  Intake 1536.13 ml  Output -  Net 1536.13 ml   Filed Weights   02/13/18 0413 02/13/18 0415  Weight: 134.7 kg 134.7 kg   Body mass index is 47.94 kg/m.   General: Pleasant, obese Caucasian female appearing in NAD Psych: Normal affect. Neuro: Alert and oriented X 3. Moves all extremities spontaneously. HEENT: Normal  Neck: Supple without bruits or JVD. Lungs:  Resp regular and unlabored, decreased breath sounds along bases bilaterally. Heart: Irregularly irregular, no s3, s4, or murmurs. Abdomen: Soft, non-tender, non-distended, BS + x 4.  Extremities: No clubbing or cyanosis. 1+ pitting edema bilaterally with erythema along RLE. DP/PT/Radials 2+ and equal bilaterally.   EKG:  The EKG was personally reviewed and demonstrates:Atrial fibrillation, HR 109, with T wave inversion along the lateral leads. No prior tracings are available for comparison.  Telemetry:  Telemetry was personally reviewed and demonstrates: Atrial fibrillation, HR in 70's to 110's.    Labs/Studies     Relevant CV Studies:  Echocardiogram: 02/13/2018 Study Conclusions  - Left ventricle: The cavity size was normal. Wall thickness was   increased in a pattern of moderate LVH. Systolic  function was   normal. The estimated ejection fraction was in the range of 55%   to 60%. Wall motion was normal; there were no regional wall   motion abnormalities. The study is not technically sufficient to   allow evaluation of LV diastolic function. - Aortic valve: Mildly calcified annulus. Mildly thickened   leaflets. Valve area (VTI): 2.55 cm^2. Valve area (Vmax): 2.28   cm^2. Valve area (Vmean): 2.42 cm^2. - Mitral valve: There was mild regurgitation. - Left atrium: The atrium was mildly dilated. - Right ventricle: The cavity size was mildly dilated. - Right atrium: The atrium was mildly dilated. - Atrial septum: No defect or patent foramen ovale was identified. - Pulmonary arteries: Systolic pressure was moderately increased.   PA peak pressure: 65 mm Hg (S). - Technically adequate study.   Laboratory Data:  Chemistry Recent Labs  Lab 02/13/18 0650 02/14/18 0532  NA 131* 135  K 3.5 3.5  CL 94* 101  CO2 25 24  GLUCOSE 323* 183*  BUN 42* 34*  CREATININE 1.25* 0.92  CALCIUM 8.4* 8.4*  GFRNONAA 43* >60  GFRAA 50* >60  ANIONGAP 12 10    Recent Labs  Lab 02/13/18 0650  PROT 6.6  ALBUMIN 2.6*  AST 18  ALT 19  ALKPHOS 142*  BILITOT 0.7   Hematology Recent Labs  Lab 02/13/18 0650 02/14/18 0532  WBC 23.3* 18.6*  RBC 4.15 4.16  HGB 10.7* 10.7*  HCT 32.8* 32.8*  MCV 79.0 78.8  MCH 25.8* 25.7*  MCHC 32.6 32.6  RDW 14.4 14.7  PLT 216 235   Cardiac EnzymesNo results for input(s): TROPONINI in the last 168 hours. No results for input(s): TROPIPOC in the last 168 hours.  BNPNo results for input(s): BNP, PROBNP in the last 168 hours.  DDimer No results for input(s): DDIMER in the last 168 hours.  Radiology/Studies:  No results found.   Assessment & Plan    1. Newly Diagnosed Atrial Fibrillation - currently admitted for lower extremity cellulitis and found to be in atrial fibrillation of unknown duration as she is unaware of the arrhythmia and no prior  tracings  are available for comparison.  - labs show WBC 23.3, Hgb 10.7, platelets 216, Na+ 131, K+ 3.5, and creatinine 1.25. Hgb A1c elevated at 9.7. TSH 1.862. Mg low at 1.6 (will replace). Echo shows a preserved EF of 55 to 60%, moderate LVH, no regional wall motion abnormalities, mild MR, and PA peak pressure is elevated to 65 mm Hg.  - HR has overall been well-controlled on PO Cardizem 39m Q6H and Lopressor 29mBID. Will plan to consolidate to Cardizem CD as BP has been stable and elevated at times. Can further titrate Cardizem and/or Lopressor as needed.  - This patients CHA2DS2-VASc Score and unadjusted Ischemic Stroke Rate (% per year) is equal to 4.8 % stroke rate/year from a score of 4 (HTN, DM, Age, Female). She is in agreement to anticoagulation, therefore will plan to start Eliquis 82m43mID. Will consult case management for 30-day card and benefits check.   2. Pulmonary HTN - PA peak pressure was elevated to 65 mm Hg by echocardiogram. She does have decreased breath sounds on examination and lower extremity edema. Was started on IVF at the time of admission due to hyponatremia. Na+ improved to 135 this AM. Will therefore stop IVF at this time. She may require IV Lasix pending clinical improvement.  - would benefit from a sleep study as an outpatient in the setting of her pulmonary HTN, morbid obesity, and newly diagnosed atrial fibrillation.   3. HTN - BP has been variable at 147/67 - 170/108 since admission. She has been continued on PTA Lisinopril 86m882mily and Lopressor 282mg11m with the addition of Cardizem as outlined above. Can further titrate BB and CCB therapy as needed.    4. HLD - continue PTA Pravastatin 86mg 1my.   5. Type 2 DM - Hgb A1c elevated to 9.7 on admission.   6. Right Lower Extremity Cellulitis - she has been started on IV Cefazolin and WBC has improved from 23.3 to 18.6. - further management per admitting team.     For questions or updates, please  contact [facility] consult [email or web address] for contact info under Cardiology/STEMI.  Signed, BrittaErma Heritage 02/14/2018, 8:41 AM Pager: [phone] note Patient seen and discussed with PA StradeAhmed Primaree with her documentation above. 71 yo 31male history of DM2, HTN, HL admitted with cellulitis. During admission noted to be in afib, a new diagnosis for the patient. She denies any specific palpitations.    K 3.5, Cr 1.25, WBC 23.3 Plt 216 TSH 1.8  EKG afib, lateral TWIs Echo LVEF 55-60%, no WMAs, mild MR, mild LAE, PASP 65   New diagnosis of afib. Her CHADS2Vasc score is 4(age x 1, DM2, HTN, gender), would start eliquis 82mg bi47mContinue rate control with dilt 180mg da67m room to titrate as needed, first dose is this AM. Some of her drive for tachycardia may decrease as her cellulitis continues to improve. Would d/c lopressor, plenty of room to titrate dilt as needed, at this time does not require 2 av nodal agents.     Fairly significant pulmonary HTN by echo, PASP 65 with mild RV enlargement with low normal function. Would consider further workup as outpatient including PFTs, sleep study, and possible RHC. Will add on HIV and ANA as inpatient labs, she has a fairly benign hepatic panel already.    JonathanCarlyle Dolly

## 2018-02-14 NOTE — Care Management (Addendum)
CM consulted for benefits check for Eliquis. Patient does not have Medication coverage.

## 2018-02-15 DIAGNOSIS — Z7189 Other specified counseling: Secondary | ICD-10-CM

## 2018-02-15 DIAGNOSIS — Z794 Long term (current) use of insulin: Secondary | ICD-10-CM

## 2018-02-15 DIAGNOSIS — E782 Mixed hyperlipidemia: Secondary | ICD-10-CM

## 2018-02-15 DIAGNOSIS — I272 Pulmonary hypertension, unspecified: Secondary | ICD-10-CM

## 2018-02-15 DIAGNOSIS — I10 Essential (primary) hypertension: Secondary | ICD-10-CM

## 2018-02-15 DIAGNOSIS — L03115 Cellulitis of right lower limb: Principal | ICD-10-CM

## 2018-02-15 DIAGNOSIS — E1165 Type 2 diabetes mellitus with hyperglycemia: Secondary | ICD-10-CM

## 2018-02-15 LAB — BASIC METABOLIC PANEL
Anion gap: 9 (ref 5–15)
BUN: 26 mg/dL — AB (ref 8–23)
CALCIUM: 8.4 mg/dL — AB (ref 8.9–10.3)
CHLORIDE: 102 mmol/L (ref 98–111)
CO2: 25 mmol/L (ref 22–32)
Creatinine, Ser: 0.74 mg/dL (ref 0.44–1.00)
GFR calc Af Amer: >60 mL/min (ref >60)
GFR calc non Af Amer: >60 mL/min (ref >60)
Glucose, Bld: 204 mg/dL — ABNORMAL HIGH (ref 70–99)
POTASSIUM: 3.4 mmol/L — AB (ref 3.5–5.1)
SODIUM: 136 mmol/L (ref 135–145)

## 2018-02-15 LAB — BLOOD CULTURE ID PANEL (REFLEXED)
ACINETOBACTER BAUMANNII: NOT DETECTED
CANDIDA ALBICANS: NOT DETECTED
CANDIDA GLABRATA: NOT DETECTED
CANDIDA PARAPSILOSIS: NOT DETECTED
CANDIDA TROPICALIS: NOT DETECTED
Candida krusei: NOT DETECTED
ENTEROBACTER CLOACAE COMPLEX: NOT DETECTED
ENTEROBACTERIACEAE SPECIES: NOT DETECTED
Enterococcus species: NOT DETECTED
Escherichia coli: NOT DETECTED
Haemophilus influenzae: NOT DETECTED
KLEBSIELLA OXYTOCA: NOT DETECTED
KLEBSIELLA PNEUMONIAE: NOT DETECTED
Listeria monocytogenes: NOT DETECTED
Methicillin resistance: NOT DETECTED
Neisseria meningitidis: NOT DETECTED
PSEUDOMONAS AERUGINOSA: NOT DETECTED
Proteus species: NOT DETECTED
STREPTOCOCCUS PNEUMONIAE: NOT DETECTED
STREPTOCOCCUS PYOGENES: NOT DETECTED
Serratia marcescens: NOT DETECTED
Staphylococcus aureus (BCID): NOT DETECTED
Staphylococcus species: DETECTED — AB
Streptococcus agalactiae: NOT DETECTED
Streptococcus species: NOT DETECTED

## 2018-02-15 LAB — GLUCOSE, CAPILLARY
GLUCOSE-CAPILLARY: 191 mg/dL — AB (ref 70–99)
GLUCOSE-CAPILLARY: 197 mg/dL — AB (ref 70–99)
GLUCOSE-CAPILLARY: 161 mg/dL — AB (ref 70–99)

## 2018-02-15 LAB — CBC
HCT: 32.1 % — ABNORMAL LOW (ref 36.0–46.0)
HEMOGLOBIN: 10.5 g/dL — AB (ref 12.0–15.0)
MCH: 25.5 pg — AB (ref 26.0–34.0)
MCHC: 32.7 g/dL (ref 30.0–36.0)
MCV: 78.1 fL (ref 78.0–100.0)
PLATELETS: 254 10*3/uL (ref 150–400)
RBC: 4.11 MIL/uL (ref 3.87–5.11)
RDW: 14.8 % (ref 11.5–15.5)
WBC: 16.3 10*3/uL — ABNORMAL HIGH (ref 4.0–10.5)

## 2018-02-15 LAB — ANA W/REFLEX IF POSITIVE: Anti Nuclear Antibody(ANA): NEGATIVE

## 2018-02-15 LAB — HIV ANTIBODY (ROUTINE TESTING W REFLEX): HIV SCREEN 4TH GENERATION: NONREACTIVE

## 2018-02-15 LAB — MAGNESIUM: Magnesium: 1.7 mg/dL (ref 1.7–2.4)

## 2018-02-15 MED ORDER — INSULIN DETEMIR 100 UNIT/ML ~~LOC~~ SOLN
25.0000 [IU] | Freq: Every day | SUBCUTANEOUS | Status: DC
  Filled 2018-02-15 (×3): qty 0.25

## 2018-02-15 MED ORDER — DILTIAZEM HCL ER COATED BEADS 240 MG PO CP24: 240.0000 mg | ORAL_CAPSULE | Freq: Every day | ORAL | Status: DC

## 2018-02-15 MED ORDER — INSULIN DETEMIR 100 UNIT/ML ~~LOC~~ SOLN: 25.0000 [IU] | Freq: Every day | SUBCUTANEOUS | 0 refills | Status: DC

## 2018-02-15 MED ORDER — APIXABAN 5 MG PO TABS: 5.0000 mg | ORAL_TABLET | Freq: Two times a day (BID) | ORAL | 0 refills | Status: DC

## 2018-02-15 MED ORDER — CEPHALEXIN 500 MG PO TABS: 500.0000 mg | ORAL_TABLET | Freq: Four times a day (QID) | ORAL | 0 refills | Status: DC

## 2018-02-15 MED ORDER — MAGNESIUM OXIDE 400 (241.3 MG) MG PO TABS
400.0000 mg | ORAL_TABLET | Freq: Once | ORAL | Status: AC
  Administered 2018-02-15: 400 mg via ORAL
  Filled 2018-02-15: qty 1

## 2018-02-15 MED ORDER — DILTIAZEM HCL ER COATED BEADS 240 MG PO CP24: 240.0000 mg | ORAL_CAPSULE | Freq: Every day | ORAL | 1 refills | Status: DC

## 2018-02-15 MED ORDER — POTASSIUM CHLORIDE CRYS ER 20 MEQ PO TBCR
40.0000 meq | EXTENDED_RELEASE_TABLET | Freq: Once | ORAL | Status: AC
  Administered 2018-02-15: 40 meq via ORAL
  Filled 2018-02-15: qty 2

## 2018-02-15 MED ORDER — INSULIN DETEMIR 100 UNIT/ML ~~LOC~~ SOLN: 24.0000 [IU] | Freq: Every day | SUBCUTANEOUS | Status: DC

## 2018-02-15 NOTE — Care Management Important Message (Signed)
Important Message  Patient Details  Name: MAVEN ROSANDER MRN: [id number] Date of Birth: [birth year]   Medicare Important Message Given:  Yes    Shelda Altes 02/15/2018, 10:14 AM

## 2018-02-15 NOTE — Progress Notes (Signed)
Patient discharged home with instructions given on medications,and follow up visits,patient verbalized. Prescriptions sent with patient.IV discontinued, catheter intact.Accompanied by staff to an awaiting vehicle.

## 2018-02-15 NOTE — Discharge Summary (Addendum)
Physician Discharge Summary  Destiny Nash LFY:[id number] DOB: December 31, 1945 DOA: 02/13/2018  PCP: [name], MD  Admit date: 02/13/2018 Discharge date: 02/15/2018  Admitted From: Home Disposition:  Home  Recommendations for Outpatient Follow-up:  1. Follow up with PCP in 1-2 weeks 2. Please obtain BMP/CBC in one week   Home Health: patient refused   Discharge Condition: Stable CODE STATUS: FULL Diet recommendation: Heart Healthy / Carb Modified   Brief/Interim Summary: 72 y.o.femalewith medical history ofHTN, DM2, HLD presented with right leg pain, erythema and edema since 02/07/18. She denies any recent injury or trauma to the leg, although she has felt some heaviness in the right leg resulting into mechanical falls since 02/07/2018. The patient has some subjective fevers and chills on 02/07/2018. The patient called her primary care provider on 02/08/2018, and she was called in a prescription for doxycycline which she has been taking until the time of admission. Since starting doxycycline on 02/08/2018, she has noted continued erythema streaking up the posterior aspect of her calf to her thigh. As result, she came to the emergency department for further evaluation.  Since admission, the pt was noted to be in atrial fibrillation, initially with RVR.  She was started diltiazem and echo was ordered.  She was started on cefazolin for her cellulitis.  Discharge Diagnoses:  Cellulitis right lower extremity -The patient has failed outpatient antibiotics -Continue cefazolin IV -Right lower extremity duplex--neg -Check CPK--22 -WBC 23.3>>>16.3 -02/15/18--pt wanted to go home--as pt's WBC improving and clinically improving-->d/c home with cephalexin x 7 more days to complete 10 days tx  New Onset Atrial Fibrilliation -change diltiazem to diltiazem CD 180 mg daily-->increase to 240 mg daily -discontinue metoprolol and titrate up diltiazem -CHADSVASc 4 (HTN, Age, DM,  female) -Appreciate cardiology consult -Echo--EF 55-60%, no WMA, PASP 65 -personally reviewed EKG--afib, nonspecific T wave changes -started apixaban  Diabetes mellitus type 2, uncontrolled with hyperglycemia -Holding glyburide and metformin-->resume after d/c -Continue Levemir--increase dose to 25 units at hs -Check hemoglobin A1c--9.7 -NovoLog sliding scale  Pulmonary HTN -outpt sleep study  Essential hypertension -Continue lisinopril and metoprolol tartrate -Holding HCTZ secondary to hyponatremia  Hyponatremia -Likely due to HCTZ -IV fluids x24 hours -improved  Hyperlipidemia -continue statin  Morbid obesity -BMI 47.94 -Lifestyle modification  Hypomagnesemia/hypokalemia -repleted   Discharge Instructions   Allergies as of 02/15/2018      Reactions   Codeine Other (See Comments)   Severe headache   Levaquin [levofloxacin] Other (See Comments)   Joint "seize" and constipation      Medication List    STOP taking these medications   doxycycline 100 MG tablet Commonly known as:  VIBRA-TABS   metoprolol tartrate 25 MG tablet Commonly known as:  LOPRESSOR     TAKE these medications   apixaban 5 MG Tabs tablet Commonly known as:  ELIQUIS Take 1 tablet (5 mg total) by mouth 2 (two) times daily.   Cephalexin 500 MG tablet Take 1 tablet (500 mg total) by mouth 4 (four) times daily.   diltiazem 240 MG 24 hr capsule Commonly known as:  CARDIZEM CD Take 1 capsule (240 mg total) by mouth daily. Start taking on:  02/16/2018   glyBURIDE 5 MG tablet Commonly known as:  DIABETA Take 10 mg by mouth 2 (two) times daily.   insulin detemir 100 UNIT/ML injection Commonly known as:  LEVEMIR Inject 0.25 mLs (25 Units total) into the skin at bedtime. What changed:    how much to take  when to take  this   lisinopril-hydrochlorothiazide 20-12.5 MG tablet Commonly known as:  PRINZIDE,ZESTORETIC Take 1 tablet by mouth daily.   metFORMIN 500 MG  tablet Commonly known as:  GLUCOPHAGE Take 500 mg by mouth 2 (two) times daily with a meal.   pravastatin 20 MG tablet Commonly known as:  PRAVACHOL TK 1 T PO QD      Follow-up Information    Erma Heritage, PA-C Follow up on 03/22/2018.   Specialties:  Physician Assistant, Cardiology Why:  Cardiology Hospital Follow-Up on 03/22/2018 at 1:30PM.  Contact information: [place] [address] [phone]          Allergies  Allergen Reactions  . Codeine Other (See Comments)    Severe headache  . Levaquin [Levofloxacin] Other (See Comments)    Joint "seize" and constipation    Consultations:  cardiology   Procedures/Studies: US Venous Img Lower Unilateral Right  Result Date: 02/14/2018 CLINICAL DATA:  Right lower extremity pain and edema for the past 6 days. Recent fall. Evaluate for DVT. EXAM: RIGHT LOWER EXTREMITY VENOUS DOPPLER ULTRASOUND TECHNIQUE: Gray-scale sonography with graded compression, as well as color Doppler and duplex ultrasound were performed to evaluate the lower extremity deep venous systems from the level of the common femoral vein and including the common femoral, femoral, profunda femoral, popliteal and calf veins including the posterior tibial, peroneal and gastrocnemius veins when visible. The superficial great saphenous vein was also interrogated. Spectral Doppler was utilized to evaluate flow at rest and with distal augmentation maneuvers in the common femoral, femoral and popliteal veins. COMPARISON:  None. FINDINGS: Examination is degraded due to patient body habitus and poor sonographic window. Contralateral Common Femoral Vein: Respiratory phasicity is normal and symmetric with the symptomatic side. No evidence of thrombus. Normal compressibility. Common Femoral Vein: No evidence of thrombus. Normal compressibility, respiratory phasicity and response to augmentation. Saphenofemoral Junction: No evidence of thrombus. Normal  compressibility and flow on color Doppler imaging. Profunda Femoral Vein: No evidence of thrombus. Normal compressibility and flow on color Doppler imaging. Femoral Vein: No evidence of thrombus. Normal compressibility, respiratory phasicity and response to augmentation. Popliteal Vein: No evidence of thrombus. Normal compressibility, respiratory phasicity and response to augmentation. Calf Veins: No evidence of thrombus. Normal compressibility and flow on color Doppler imaging. Superficial Great Saphenous Vein: No evidence of thrombus. Normal compressibility. Venous Reflux:  None. Other Findings:  None. IMPRESSION: No evidence of DVT within the right lower extremity on this body habitus degraded examination. Electronically Signed   By: Sandi Mariscal M.D.   On: 02/14/2018 11:32        Discharge Exam: Vitals:   02/14/18 2138 02/15/18 0523  BP: (!) 151/86 (!) 161/89  Pulse: (!) 111 (!) 108  Resp:    Temp: 98.2 F (36.8 C) 98.5 F (36.9 C)  SpO2: 100% 98%   Vitals:   02/14/18 1513 02/14/18 1946 02/14/18 2138 02/15/18 0523  BP: (!) 144/65  (!) 151/86 (!) 161/89  Pulse: 63  (!) 111 (!) 108  Resp: 18     Temp: 98.1 F (36.7 C)  98.2 F (36.8 C) 98.5 F (36.9 C)  TempSrc:   Oral Oral  SpO2: 100% 99% 100% 98%  Weight:      Height:        General: Pt is alert, awake, not in acute distress Cardiovascular: IRRR, S1/S2 +, no rubs, no gallops Respiratory: CTA bilaterally, no wheezing, no rhonchi Abdominal: Soft, NT, ND, bowel sounds + Extremities: 2+ RLE edema,  no cyanosis; mild pretibial edema.  +venous stasis changes   The results of significant diagnostics from this hospitalization (including imaging, microbiology, ancillary and laboratory) are listed below for reference.    Significant Diagnostic Studies: US Venous Img Lower Unilateral Right  Result Date: 02/14/2018 CLINICAL DATA:  Right lower extremity pain and edema for the past 6 days. Recent fall. Evaluate for DVT. EXAM: RIGHT  LOWER EXTREMITY VENOUS DOPPLER ULTRASOUND TECHNIQUE: Gray-scale sonography with graded compression, as well as color Doppler and duplex ultrasound were performed to evaluate the lower extremity deep venous systems from the level of the common femoral vein and including the common femoral, femoral, profunda femoral, popliteal and calf veins including the posterior tibial, peroneal and gastrocnemius veins when visible. The superficial great saphenous vein was also interrogated. Spectral Doppler was utilized to evaluate flow at rest and with distal augmentation maneuvers in the common femoral, femoral and popliteal veins. COMPARISON:  None. FINDINGS: Examination is degraded due to patient body habitus and poor sonographic window. Contralateral Common Femoral Vein: Respiratory phasicity is normal and symmetric with the symptomatic side. No evidence of thrombus. Normal compressibility. Common Femoral Vein: No evidence of thrombus. Normal compressibility, respiratory phasicity and response to augmentation. Saphenofemoral Junction: No evidence of thrombus. Normal compressibility and flow on color Doppler imaging. Profunda Femoral Vein: No evidence of thrombus. Normal compressibility and flow on color Doppler imaging. Femoral Vein: No evidence of thrombus. Normal compressibility, respiratory phasicity and response to augmentation. Popliteal Vein: No evidence of thrombus. Normal compressibility, respiratory phasicity and response to augmentation. Calf Veins: No evidence of thrombus. Normal compressibility and flow on color Doppler imaging. Superficial Great Saphenous Vein: No evidence of thrombus. Normal compressibility. Venous Reflux:  None. Other Findings:  None. IMPRESSION: No evidence of DVT within the right lower extremity on this body habitus degraded examination. Electronically Signed   By: Sandi Mariscal M.D.   On: 02/14/2018 11:32     Microbiology: Recent Results (from the past 240 hour(s))  Culture, blood  (routine x 2)     Status: None (Preliminary result)   Collection Time: 02/13/18  6:50 AM  Result Value Ref Range Status   Specimen Description   Final    BLOOD RIGHT ARM Performed at [facility], [address], [place], [place] [address]    Special Requests   Final    BOTTLES DRAWN AEROBIC AND ANAEROBIC Blood Culture adequate volume Performed at [facility], [address] [place], [place] [address]    Culture  Setup Time   Final    GRAM POSITIVE COCCI Gram Stain Report Called to,Read Back By and Verified With: NURSE HANSON 2335 ON 02/14/2018 BY EVA AEROBIC BOTTLE ONLY Performed at [place] BY AND VERIFIED WITHHubert Azure RN [phone] 02/15/18 A BROWNING Performed at [facility], [place] [address] [place], [place] [address]    Culture GRAM POSITIVE COCCI  Final   Report Status PENDING  Incomplete  Culture, blood (routine x 2)     Status: None (Preliminary result)   Collection Time: 02/13/18  6:50 AM  Result Value Ref Range Status   Specimen Description BLOOD LEFT ARM  Final   Special Requests   Final    BOTTLES DRAWN AEROBIC ONLY Blood Culture adequate volume   Culture   Final    NO GROWTH 2 DAYS Performed at [facility], [address] [place],  [address]    Report Status PENDING  Incomplete  Blood Culture  ID Panel (Reflexed)     Status: Abnormal   Collection Time: 02/13/18  6:50 AM  Result Value Ref Range Status   Enterococcus species NOT DETECTED NOT DETECTED Final   Listeria monocytogenes NOT DETECTED NOT DETECTED Final   Staphylococcus species DETECTED (A) NOT DETECTED Final    Comment: Methicillin (oxacillin) susceptible coagulase negative staphylococcus. Possible blood culture contaminant (unless isolated from more than one blood culture draw or clinical case suggests pathogenicity). No antibiotic treatment is indicated for blood  culture contaminants. CRITICAL RESULT CALLED TO, READ BACK BY AND VERIFIED WITHHubert Azure RN [phone] 02/15/18 A BROWNING    Staphylococcus aureus NOT DETECTED NOT DETECTED Final   Methicillin resistance NOT DETECTED NOT DETECTED Final   Streptococcus species NOT DETECTED NOT DETECTED Final   Streptococcus agalactiae NOT DETECTED NOT DETECTED Final   Streptococcus pneumoniae NOT DETECTED NOT DETECTED Final   Streptococcus pyogenes NOT DETECTED NOT DETECTED Final   Acinetobacter baumannii NOT DETECTED NOT DETECTED Final   Enterobacteriaceae species NOT DETECTED NOT DETECTED Final   Enterobacter cloacae complex NOT DETECTED NOT DETECTED Final   Escherichia coli NOT DETECTED NOT DETECTED Final   Klebsiella oxytoca NOT DETECTED NOT DETECTED Final   Klebsiella pneumoniae NOT DETECTED NOT DETECTED Final   Proteus species NOT DETECTED NOT DETECTED Final   Serratia marcescens NOT DETECTED NOT DETECTED Final   Haemophilus influenzae NOT DETECTED NOT DETECTED Final   Neisseria meningitidis NOT DETECTED NOT DETECTED Final   Pseudomonas aeruginosa NOT DETECTED NOT DETECTED Final   Candida albicans NOT DETECTED NOT DETECTED Final   Candida glabrata NOT DETECTED NOT DETECTED Final   Candida krusei NOT DETECTED NOT DETECTED Final   Candida parapsilosis NOT DETECTED NOT DETECTED Final   Candida tropicalis NOT DETECTED NOT DETECTED Final    Comment: Performed at [facility], [place] [address] [place],  [address]     Labs: Basic Metabolic Panel: Recent Labs  Lab 02/13/18 0650 02/14/18 0532 02/15/18 0500  NA 131* 135 136  K 3.5 3.5 3.4*  CL 94* 101 102  CO2 25 24 25   GLUCOSE 323* 183* 204*  BUN 42* 34* 26*  CREATININE 1.25* 0.92 0.74  CALCIUM 8.4* 8.4* 8.4*  MG  --  1.6* 1.7   Liver Function Tests: Recent Labs  Lab 02/13/18 0650  AST 18  ALT 19  ALKPHOS 142*  BILITOT 0.7  PROT 6.6  ALBUMIN 2.6*   No results for input(s): LIPASE, AMYLASE in the last 168 hours. No results for input(s): AMMONIA in the last 168 hours. CBC: Recent Labs  Lab 02/13/18 0650  02/14/18 0532 02/15/18 0500  WBC 23.3* 18.6* 16.3*  NEUTROABS 20.8*  --   --   HGB 10.7* 10.7* 10.5*  HCT 32.8* 32.8* 32.1*  MCV 79.0 78.8 78.1  PLT 216 235 254   Cardiac Enzymes: Recent Labs  Lab 02/13/18 0956  CKTOTAL 22*   BNP: Invalid input(s): POCBNP CBG: Recent Labs  Lab 02/14/18 1145 02/14/18 1604 02/14/18 2139 02/15/18 0727 02/15/18 1200  GLUCAP 201* 163* 200* 191* 197*    Time coordinating discharge:  36 minutes  Signed:  Orson Eva, DO [facility] Pager: [phone] 02/15/2018, 4:18 PM

## 2018-02-15 NOTE — Progress Notes (Addendum)
Progress Note  Patient Name: Destiny Nash Date of Encounter: 02/15/2018  Primary Cardiologist: Carlyle Dolly, MD   Subjective   No chest pain or palpitations overnight. Main concern is that she has been experiencing right shoulder pain and back pain since her recent fall. Feels very uncomfortable.   Inpatient Medications    Scheduled Meds: . apixaban  5 mg Oral BID  . [START ON 02/16/2018] diltiazem  240 mg Oral Daily  . insulin aspart  0-20 Units Subcutaneous TID WC  . insulin aspart  0-5 Units Subcutaneous QHS  . insulin detemir  20 Units Subcutaneous QHS  . lisinopril  20 mg Oral Daily  . pravastatin  20 mg Oral q1800   Continuous Infusions: . sodium chloride Stopped (02/14/18 1554)  .  ceFAZolin (ANCEF) IV Stopped (02/15/18 0556)   PRN Meds: acetaminophen **OR** acetaminophen, ondansetron **OR** ondansetron (ZOFRAN) IV   Vital Signs    Vitals:   02/14/18 1513 02/14/18 1946 02/14/18 2138 02/15/18 0523  BP: (!) 144/65  (!) 151/86 (!) 161/89  Pulse: 63  (!) 111 (!) 108  Resp: 18     Temp: 98.1 F (36.7 C)  98.2 F (36.8 C) 98.5 F (36.9 C)  TempSrc:   Oral Oral  SpO2: 100% 99% 100% 98%  Weight:      Height:        Intake/Output Summary (Last 24 hours) at 02/15/2018 1159 Last data filed at 02/15/2018 0900 Gross per 24 hour  Intake 1381.68 ml  Output -  Net 1381.68 ml   Filed Weights   02/13/18 0413 02/13/18 0415  Weight: 134.7 kg 134.7 kg    Telemetry    Atrial fibrillation, HR in 90's to low-100's.  - Personally Reviewed  ECG    No new tracings.   Physical Exam   General: Well developed, well nourished elderly Caucasian female appearing in no acute distress. Head: Normocephalic, atraumatic.  Neck: Supple without bruits, JVD not elevated. Lungs:  Resp regular and unlabored, CTA without wheezing or rales. Heart: Irregularly irregular, S1, S2, no S3, S4, or murmur; no rub. Abdomen: Soft, non-tender, non-distended with normoactive bowel  sounds. No hepatomegaly. No rebound/guarding. No obvious abdominal masses. Extremities: No clubbing, cyanosis, 1+ pitting edema with erythema present along RLE. Distal pedal pulses are 2+ bilaterally. Neuro: Alert and oriented X 3. Moves all extremities spontaneously. Psych: Normal affect.  Labs    Chemistry Recent Labs  Lab 02/13/18 0650 02/14/18 0532 02/15/18 0500  NA 131* 135 136  K 3.5 3.5 3.4*  CL 94* 101 102  CO2 25 24 25   GLUCOSE 323* 183* 204*  BUN 42* 34* 26*  CREATININE 1.25* 0.92 0.74  CALCIUM 8.4* 8.4* 8.4*  PROT 6.6  --   --   ALBUMIN 2.6*  --   --   AST 18  --   --   ALT 19  --   --   ALKPHOS 142*  --   --   BILITOT 0.7  --   --   GFRNONAA 43* >60 >60  GFRAA 50* >60 >60  ANIONGAP 12 10 9      Hematology Recent Labs  Lab 02/13/18 0650 02/14/18 0532 02/15/18 0500  WBC 23.3* 18.6* 16.3*  RBC 4.15 4.16 4.11  HGB 10.7* 10.7* 10.5*  HCT 32.8* 32.8* 32.1*  MCV 79.0 78.8 78.1  MCH 25.8* 25.7* 25.5*  MCHC 32.6 32.6 32.7  RDW 14.4 14.7 14.8  PLT 216 235 254    Cardiac EnzymesNo results for  input(s): TROPONINI in the last 168 hours. No results for input(s): TROPIPOC in the last 168 hours.   BNP Recent Labs  Lab 02/14/18 0821  BNP 406.0*     DDimer No results for input(s): DDIMER in the last 168 hours.   Radiology    US Venous Img Lower Unilateral Right  Result Date: 02/14/2018 CLINICAL DATA:  Right lower extremity pain and edema for the past 6 days. Recent fall. Evaluate for DVT. EXAM: RIGHT LOWER EXTREMITY VENOUS DOPPLER ULTRASOUND TECHNIQUE: Gray-scale sonography with graded compression, as well as color Doppler and duplex ultrasound were performed to evaluate the lower extremity deep venous systems from the level of the common femoral vein and including the common femoral, femoral, profunda femoral, popliteal and calf veins including the posterior tibial, peroneal and gastrocnemius veins when visible. The superficial great saphenous vein was also  interrogated. Spectral Doppler was utilized to evaluate flow at rest and with distal augmentation maneuvers in the common femoral, femoral and popliteal veins. COMPARISON:  None. FINDINGS: Examination is degraded due to patient body habitus and poor sonographic window. Contralateral Common Femoral Vein: Respiratory phasicity is normal and symmetric with the symptomatic side. No evidence of thrombus. Normal compressibility. Common Femoral Vein: No evidence of thrombus. Normal compressibility, respiratory phasicity and response to augmentation. Saphenofemoral Junction: No evidence of thrombus. Normal compressibility and flow on color Doppler imaging. Profunda Femoral Vein: No evidence of thrombus. Normal compressibility and flow on color Doppler imaging. Femoral Vein: No evidence of thrombus. Normal compressibility, respiratory phasicity and response to augmentation. Popliteal Vein: No evidence of thrombus. Normal compressibility, respiratory phasicity and response to augmentation. Calf Veins: No evidence of thrombus. Normal compressibility and flow on color Doppler imaging. Superficial Great Saphenous Vein: No evidence of thrombus. Normal compressibility. Venous Reflux:  None. Other Findings:  None. IMPRESSION: No evidence of DVT within the right lower extremity on this body habitus degraded examination. Electronically Signed   By: Sandi Mariscal M.D.   On: 02/14/2018 11:32    Cardiac Studies   Echocardiogram: 02/2018 Study Conclusions  - Left ventricle: The cavity size was normal. Wall thickness was   increased in a pattern of moderate LVH. Systolic function was   normal. The estimated ejection fraction was in the range of 55%   to 60%. Wall motion was normal; there were no regional wall   motion abnormalities. The study is not technically sufficient to   allow evaluation of LV diastolic function. - Aortic valve: Mildly calcified annulus. Mildly thickened   leaflets. Valve area (VTI): 2.55 cm^2. Valve  area (Vmax): 2.28   cm^2. Valve area (Vmean): 2.42 cm^2. - Mitral valve: There was mild regurgitation. - Left atrium: The atrium was mildly dilated. - Right ventricle: The cavity size was mildly dilated. - Right atrium: The atrium was mildly dilated. - Atrial septum: No defect or patent foramen ovale was identified. - Pulmonary arteries: Systolic pressure was moderately increased.   PA peak pressure: 65 mm Hg (S). - Technically adequate study.   Patient Profile     72 y.o. female with past medical history of HTN, HLD, and Type 2 DM who is currently admitted for lower extremity cellulitis and was found to be in atrial fibrillation (presumed new diagnosis for the patient).   Assessment & Plan    1. Newly Diagnosed Atrial Fibrillation - admitted for lower extremity cellulitis and found to be in atrial fibrillation of unknown duration as she is unaware of the arrhythmia and no prior tracings are available  for comparison.  - K+ and TSH WNL. Mg low at 1.6 on admission and supplementation was administered. Echo shows a preserved EF of 55 to 60%, moderate LVH, no regional wall motion abnormalities, mild MR, and PA peak pressure is elevated to 65 mm Hg.  - HR has been in the 90's to low-100's, likely driven by her underlying infection. Would plan to further titrate Cardizem CD to 253m daily for improved rate-control.  - Due to CHA2DS2-VASc Score of 4 (HTN, DM, Age, Female), she has been started on Eliquis 569mBID for anticoagulation. She does not have Medicare Part D coverage, therefore she will need to apply for patient assistance following hospital discharge.   2. Pulmonary HTN - PA peak pressure was elevated to 65 mm Hg by echocardiogram. BNP was elevated to 406 on 9/12 and IVF were discontinued at that time. Her lungs are clear on examination today. No indication for diuresis at this time.  - would benefit from a sleep study as an outpatient in the setting of her pulmonary HTN, morbid  obesity, and newly diagnosed atrial fibrillation.   3. HTN - BP has been variable at 144/65 - 161/89 over the past 24 hours. On Lisinopril 2049maily and Lopressor 63m72mD prior to admission. Lopressor was discontinued by Dr. BranHarl Bowie to the initiation of Cardizem CD. Will plan to further titrate Cardizem CD as outlined above.  4. HLD - continue PTA Pravastatin 20mg66mly.   5. Type 2 DM - Hgb A1c found to be elevated to 9.7 this admission. Diabetes Coordinator following.   6. Right Lower Extremity Cellulitis - she has been started on IV Cefazolin. WBC elevated to 23.3 on admission, improved to 16.3 this AM.  - further management per admitting team.    [facility] will sign off.   Medication Recommendations:  Cardizem CD 240mg 53my and Eliquis 5mg BI83mther recommendations (labs, testing, etc): None Follow up as an outpatient: Hospital Follow-up in 3 weeks with APP or Dr. Branch Harl Bowiequestions or updates, please contact [facility] consult [email or web address] for contact info under Cardiology/STEMI.    Signed, BrittanErma Heritage 11:59 AM 02/15/2018 Pager: [phone] was seen and examined, and I agree with the history, physical exam, assessment and plan as documented above, with modifications as noted below.  She told me "I feel terrible and I want to go home ".  She denies chest pain and palpitations.  Atrial fibrillation heart rates are mildly elevated.  We will increase long-acting diltiazem to 240 mg daily.  She is now anticoagulated with Eliquis 5 mg twice daily.  Echocardiogram demonstrates hypertensive heart disease and with moderate LVH she likely has at least mild if not moderate diastolic dysfunction contributing to elevated pulmonary pressures.  Given her morbid obesity and atrial fibrillation, she would benefit from an outpatient sleep study.  She will also need pulmonary function testing.  Based on the results of the studies,  consideration can be given for right heart catheterization.  She can be discharged from my standpoint.  We will sign off.  We will arrange for outpatient follow-up in our office.   Zaneta Lightcap Kate SableACC  9[facility]2019 12:23 PM

## 2018-02-15 NOTE — Progress Notes (Signed)
Inpatient Diabetes Program Recommendations  AACE/ADA: New Consensus Statement on Inpatient Glycemic Control (2019)  Target Ranges:  Prepandial:   less than 140 mg/dL      Peak postprandial:   less than 180 mg/dL (1-2 hours)      Critically ill patients:  140 - 180 mg/dL  Results for [name], Destiny Nash" (MRN [id number]) as of 02/15/2018 06:37  Ref. Range 02/15/2018 05:00  Glucose Latest Ref Range: 70 - 99 mg/dL 204 (H)    Results for Destiny Nash, Destiny Nash" (MRN [id number]) as of 02/15/2018 06:37  Ref. Range 02/14/2018 07:47 02/14/2018 11:45 02/14/2018 16:04 02/14/2018 21:39  Glucose-Capillary Latest Ref Range: 70 - 99 mg/dL 182 (H) 201 (H) 163 (H) 200 (H)    Review of Glycemic Control  Diabetes history: DM2 Outpatient Diabetes medications: Levemir 14 units, Metformin 500 mg BID, Glyburide 10 mg BID Current orders for Inpatient glycemic control: Levemir 20 units QHS, Novolog 0-20 units TID with meals, Novolog 0-5 units QHS  Inpatient Diabetes Program Recommendations:  Insulin-Meal Coverage:  If post prandial glucose is consistently greater than 180 mg/dl, please consider ordering Novolog 3 units TID with meals for meal coverage. A1C: A1C 9.7% on 02/13/18 indicating an average glucose of 232 mg/dl. Patient reports A1C was 8.6% in July 2019. MD, may want to consider adjusting outpatient DM medications at time of discharge and have patient follow up with PCP regarding DM control.  Thanks, Barnie Alderman, RN, MSN, CDE Diabetes Coordinator Inpatient Diabetes Program [phone] (Team Pager from 8am to 5pm)

## 2018-02-15 NOTE — Care Management Note (Signed)
Case Management Note  Patient Details  Name: BRONNIE VASSEUR MRN: [id number] Date of Birth: 1945-12-30  Subjective/Objective:   Cellulitis. New AFib with RVR. From home with husband. Has RW. Has PCP-Dr Kenton Kingfisher at [facility]. Patient does not have insurance with medication coverage. Her medications have not been expensive and she pays OOP.  She has been started on Eliquis. Discussed with patient and husband at bedside.  Patient's daughter works at her [employer] office. They have two weeks worth of samples of Eliquis they plan to give patient. CM has provided 30 day coupon for Eliquis. PCP will also help patient fill out financial assistance paper work for Eliquis (CM provided paperwork for this).              Action/Plan: DC home. Patient declines home health.   Expected Discharge Date:  02/15/18               Expected Discharge Plan:  Home/Self Care  In-House Referral:     Discharge planning Services  CM Consult, Medication Assistance  Post Acute Care Choice:  Home Health Choice offered to:  Patient  DME Arranged:    DME Agency:     HH Arranged:  Patient Refused [place] Agency:     Status of Service:  Completed, signed off  If discussed at [employer], dates discussed:    Additional Comments:  Shanyia Stines, Chauncey Reading, RN 02/15/2018, 12:05 PM

## 2018-02-17 LAB — CULTURE, BLOOD (ROUTINE X 2): Special Requests: ADEQUATE

## 2018-02-18 LAB — CULTURE, BLOOD (ROUTINE X 2)
CULTURE: NO GROWTH
SPECIAL REQUESTS: ADEQUATE

## 2018-02-28 DIAGNOSIS — I872 Venous insufficiency (chronic) (peripheral): Secondary | ICD-10-CM | POA: Diagnosis not present

## 2018-02-28 DIAGNOSIS — E1165 Type 2 diabetes mellitus with hyperglycemia: Secondary | ICD-10-CM | POA: Diagnosis not present

## 2018-02-28 DIAGNOSIS — I10 Essential (primary) hypertension: Secondary | ICD-10-CM | POA: Diagnosis not present

## 2018-02-28 DIAGNOSIS — R600 Localized edema: Secondary | ICD-10-CM | POA: Diagnosis not present

## 2018-02-28 DIAGNOSIS — E7800 Pure hypercholesterolemia, unspecified: Secondary | ICD-10-CM | POA: Diagnosis not present

## 2018-02-28 DIAGNOSIS — I482 Chronic atrial fibrillation: Secondary | ICD-10-CM | POA: Diagnosis not present

## 2018-02-28 DIAGNOSIS — N183 Chronic kidney disease, stage 3 (moderate): Secondary | ICD-10-CM | POA: Diagnosis not present

## 2018-02-28 DIAGNOSIS — Z23 Encounter for immunization: Secondary | ICD-10-CM | POA: Diagnosis not present

## 2018-03-22 ENCOUNTER — Encounter: Payer: Self-pay | Admitting: Student

## 2018-03-22 ENCOUNTER — Ambulatory Visit (INDEPENDENT_AMBULATORY_CARE_PROVIDER_SITE_OTHER): Payer: Medicare Other | Admitting: Student

## 2018-03-22 VITALS — BP 114/72 | HR 90 | Ht 66.0 in | Wt 275.0 lb

## 2018-03-22 DIAGNOSIS — I10 Essential (primary) hypertension: Secondary | ICD-10-CM | POA: Diagnosis not present

## 2018-03-22 DIAGNOSIS — E782 Mixed hyperlipidemia: Secondary | ICD-10-CM | POA: Diagnosis not present

## 2018-03-22 DIAGNOSIS — L03115 Cellulitis of right lower limb: Secondary | ICD-10-CM

## 2018-03-22 DIAGNOSIS — I272 Pulmonary hypertension, unspecified: Secondary | ICD-10-CM

## 2018-03-22 DIAGNOSIS — I4819 Other persistent atrial fibrillation: Secondary | ICD-10-CM | POA: Diagnosis not present

## 2018-03-22 DIAGNOSIS — Z7901 Long term (current) use of anticoagulants: Secondary | ICD-10-CM

## 2018-03-22 MED ORDER — DILTIAZEM HCL ER COATED BEADS 240 MG PO CP24: 240.0000 mg | ORAL_CAPSULE | Freq: Every day | ORAL | 11 refills | Status: DC

## 2018-03-22 NOTE — Progress Notes (Signed)
Cardiology Office Note    Date:  03/22/2018   ID:  Destiny Nash, DOB 01-Dec-1945, MRN [id number]  PCP:  [name], MD  Cardiologist: Carlyle Dolly, MD    Chief Complaint  Patient presents with  . Hospitalization Follow-up    History of Present Illness:    Destiny Nash is a 72 y.o. female with past medical history of HTN, HLD, and Type 2 DM who presents to the office today for hospital follow-up.  She was recently admitted to [facility] from 02/13/2018 to 02/15/2018 for worsening weakness along her right lower extremity.  She had previously been treated by her PCP for cellulitis with minimal improvement in her symptoms. In the ED, she was found to be in presumed new-onset atrial fibrillation with heart rate in the 110's.  She was continued on PTA Lopressor 25 mg twice daily and started on PO Cardizem for rate control. Given her CHA2DS2-VASc Score of 4, she was started on Eliquis 26m BID for anticoagulation. An echocardiogram was obtained upon admission and showed a preserved EF of 55 to 60%, moderate LVH, and no regional wall motion abnormalities. PA peak pressure was elevated to 65 mm Hg by echocardiogram. Lopressor was discontinued at the time of discharge and she was transitioned to Cardizem CD 2456mdaily for rate-control with plans for an outpatient sleep study in regards to her PAH.   In talking with the patient today, she reports overall doing well from a cardiac perspective since her hospitalization. She was having occasional palpitations following hospital discharge but denies any recurrent palpitations over the past few weeks. No recent chest pain, dyspnea on exertion, orthopnea, or PND. She does report having lower extremity edema and is concerned about having recurrent cellulitis as she has had this over 5 times within the past year.  She reports good compliance with anticoagulation and denies missing any doses. No evidence of melena, hematochezia, or  hematuria.  Past Medical History:  Diagnosis Date  . [place]rial [place] ([place]   a. diagnosed in 02/2018.  . Marland Kitchenellulitis   . Diabetes mellitus without complication ([facility]  . Hyperlipidemia   . Hypertension     Past Surgical History:  Procedure Laterality Date  . DILATION AND CURETTAGE OF UTERUS    . TONSILLECTOMY AND ADENOIDECTOMY      Current Medications: Outpatient Medications Prior to Visit  Medication Sig Dispense Refill  . apixaban (ELIQUIS) 5 MG TABS tablet Take 1 tablet (5 mg total) by mouth 2 (two) times daily. 60 tablet 0  . Cephalexin 500 MG tablet Take 1 tablet (500 mg total) by mouth 4 (four) times daily. 28 tablet 0  . glyBURIDE (DIABETA) 5 MG tablet Take 10 mg by mouth 2 (two) times daily.  0  . insulin detemir (LEVEMIR) 100 UNIT/ML injection Inject 0.25 mLs (25 Units total) into the skin at bedtime. (Patient taking differently: Inject 14 Units into the skin at bedtime. ) 10 mL 0  . lisinopril-hydrochlorothiazide (PRINZIDE,ZESTORETIC) 20-12.5 MG tablet Take 1 tablet by mouth daily.  0  . metFORMIN (GLUCOPHAGE) 500 MG tablet Take 500 mg by mouth 2 (two) times daily with a meal.    . pravastatin (PRAVACHOL) 20 MG tablet TK 1 T PO QD  0  . diltiazem (CARDIZEM CD) 240 MG 24 hr capsule Take 1 capsule (240 mg total) by mouth daily. 30 capsule 1   No facility-administered medications prior to visit.      Allergies:   Codeine and Levaquin [levofloxacin]  Social History   Socioeconomic History  . Marital status: Married    Spouse name: Not on file  . Number of children: Not on file  . Years of education: Not on file  . Highest education level: Not on file  Occupational History  . Not on file  Social Needs  . Financial resource strain: Not on file  . Food insecurity:    Worry: Not on file    Inability: Not on file  . Transportation needs:    Medical: Not on file    Non-medical: Not on file  Tobacco Use  . Smoking status: Never Smoker  . Smokeless tobacco:  Never Used  Substance and Sexual Activity  . Alcohol use: Not Currently  . Drug use: Not Currently  . Sexual activity: Not Currently  Lifestyle  . Physical activity:    Days per week: Not on file    Minutes per session: Not on file  . Stress: Not on file  Relationships  . Social connections:    Talks on phone: Not on file    Gets together: Not on file    Attends religious service: Not on file    Active member of club or organization: Not on file    Attends meetings of clubs or organizations: Not on file    Relationship status: Not on file  Other Topics Concern  . Not on file  Social History Narrative  . Not on file     Family History:  The patient's family history includes CAD in her brother; Hyperlipidemia in her mother; Hypertension in her brother and mother.   Review of Systems:   Please see the history of present illness.     General:  No chills, fever, night sweats or weight changes.  Cardiovascular:  No chest pain, dyspnea on exertion, edema, orthopnea, paroxysmal nocturnal dyspnea. Positive for palpitations (now resolved).   Dermatological: No rash, lesions/masses Respiratory: No cough, dyspnea Urologic: No hematuria, dysuria Abdominal:   No nausea, vomiting, diarrhea, bright red blood per rectum, melena, or hematemesis Neurologic:  No visual changes, wkns, changes in mental status. All other systems reviewed and are otherwise negative except as noted above.   Physical Exam:    VS:  BP 114/72   Pulse 90   Ht 5' 6"  (1.676 m)   Wt 275 lb (124.7 kg)   SpO2 93%   BMI 44.39 kg/m    General: Well developed, well nourished Caucasian female appearing in no acute distress. Head: Normocephalic, atraumatic, sclera non-icteric, no xanthomas, nares are without discharge.  Neck: No carotid bruits. JVD not elevated.  Lungs: Respirations regular and unlabored, without wheezes or rales.  Heart: Irregularly irregular. No S3 or S4.  No murmur, no rubs, or gallops  appreciated. Abdomen: Soft, non-tender, non-distended with normoactive bowel sounds. No hepatomegaly. No rebound/guarding. No obvious abdominal masses. Msk:  Strength and tone appear normal for age. No joint deformities or effusions. Extremities: No clubbing or cyanosis. Trace lower extremity edema. No significant erythema present.  Distal pedal pulses are 2+ bilaterally. Neuro: Alert and oriented X 3. Moves all extremities spontaneously. No focal deficits noted. Psych:  Responds to questions appropriately with a normal affect. Skin: No rashes or lesions noted  Wt Readings from Last 3 Encounters:  03/22/18 275 lb (124.7 kg)  02/13/18 297 lb (134.7 kg)     Studies/Labs Reviewed:   EKG:  EKG is not ordered today.   Recent Labs: 02/13/2018: ALT 19 02/14/2018: B Natriuretic Peptide 406.0; TSH 1.862  02/15/2018: BUN 26; Creatinine, Ser 0.74; Hemoglobin 10.5; Magnesium 1.7; Platelets 254; Potassium 3.4; Sodium 136   Lipid Panel No results found for: CHOL, TRIG, HDL, CHOLHDL, VLDL, LDLCALC, LDLDIRECT  Additional studies/ records that were reviewed today include:   Echocardiogram: 02/2018 Study Conclusions  - Left ventricle: The cavity size was normal. Wall thickness was   increased in a pattern of moderate LVH. Systolic function was   normal. The estimated ejection fraction was in the range of 55%   to 60%. Wall motion was normal; there were no regional wall   motion abnormalities. The study is not technically sufficient to   allow evaluation of LV diastolic function. - Aortic valve: Mildly calcified annulus. Mildly thickened   leaflets. Valve area (VTI): 2.55 cm^2. Valve area (Vmax): 2.28   cm^2. Valve area (Vmean): 2.42 cm^2. - Mitral valve: There was mild regurgitation. - Left atrium: The atrium was mildly dilated. - Right ventricle: The cavity size was mildly dilated. - Right atrium: The atrium was mildly dilated. - Atrial septum: No defect or patent foramen ovale was  identified. - Pulmonary arteries: Systolic pressure was moderately increased.   PA peak pressure: 65 mm Hg (S). - Technically adequate study.   Assessment:    1. Persistent atrial fibrillation   2. Current use of long term anticoagulation   3. Pulmonary hypertension, unspecified ([place])   4. Essential hypertension   5. Mixed hyperlipidemia   6. Cellulitis of right lower extremity      Plan:   In order of problems listed above:  1. Persistent Atrial Fibrillation/ Use of Long-Term Anticoagulation - She was experiencing palpitations for a few days following hospital discharge but denies any recurrent symptoms over the past several weeks. Her rhythm is irregular by examination today and she is overall asymptomatic with this. We reviewed strategies including rate versus rhythm control and she wishes to avoid additional medications or procedures if possible. Would therefore continue on Cardizem CD 240 mg daily. This can be further titrated if needed. - She denies any evidence of active bleeding. Was started on Eliquis for anticoagulation but she does not have drug coverage through her current insurance. She has been obtaining samples from her PCP's office. Was provided with papers to complete today in regards to patient assistance. If she is not approved, would need to consider switching from Eliquis to Coumadin.  2. Pulmonary HTN -PA peak pressurewaselevated to 65 mm Hgby echocardiogram. Reviewed the indications for a sleep study with the patient and she wishes to discuss further with her PCP as she is unsure if she underwent testing in the past and strongly believes she does not have sleep apnea as she has never been told she snores and denies daytime somnolence. She will call back to schedule if interested in proceeding with testing.  3. HTN - BP is well controlled at 114/72 during today's visit. Continue Cardizem CD 240 mg daily and Lisinopril-HCTZ 20-12.27m daily.   4. HLD -  followed by PCP. Remains on Pravastatin 237mdaily.   5. Recurrent Cellulitis - she has completed her course of antibiotic therapy. She does have mild swelling along her RLE but no significant erythema by examination. Being followed by her PCP.     Medication Adjustments/Labs and Tests Ordered: Current medicines are reviewed at length with the patient today.  Concerns regarding medicines are outlined above.  Medication changes, Labs and Tests ordered today are listed in the Patient Instructions below. Patient Instructions  Medication Instructions:  No Change  If you need a refill on your cardiac medications before your next appointment, please call your pharmacy.   Lab work: NONE If you have labs (blood work) drawn today and your tests are completely normal, you will receive your results only by: Marland Kitchen MyChart Message (if you have MyChart) OR . A paper copy in the mail If you have any lab test that is abnormal or we need to change your treatment, we will call you to review the results.  Testing/Procedures: NONE  Follow-Up: . 3 months Dr.Branch  Any Other Special Instructions Will Be Listed Below (If Applicable). Please complete Eliquis Patient assistance form and return to office    Signed, Erma Heritage, PA-C  03/22/2018 5:09 PM    [facility] [place], [place] [address] Phone: [phone]

## 2018-03-22 NOTE — Patient Instructions (Signed)
Medication Instructions:  No Change If you need a refill on your cardiac medications before your next appointment, please call your pharmacy.   Lab work: NONE If you have labs (blood work) drawn today and your tests are completely normal, you will receive your results only by: Marland Kitchen MyChart Message (if you have MyChart) OR . A paper copy in the mail If you have any lab test that is abnormal or we need to change your treatment, we will call you to review the results.  Testing/Procedures: NONE  Follow-Up: . 3 months Dr.Branch  Any Other Special Instructions Will Be Listed Below (If Applicable). Please complete Eliquis Patient assistance form and return to office

## 2018-04-10 DIAGNOSIS — Z1211 Encounter for screening for malignant neoplasm of colon: Secondary | ICD-10-CM | POA: Diagnosis not present

## 2018-04-10 DIAGNOSIS — E7800 Pure hypercholesterolemia, unspecified: Secondary | ICD-10-CM | POA: Diagnosis not present

## 2018-04-10 DIAGNOSIS — E1165 Type 2 diabetes mellitus with hyperglycemia: Secondary | ICD-10-CM | POA: Diagnosis not present

## 2018-04-10 DIAGNOSIS — Z1239 Encounter for other screening for malignant neoplasm of breast: Secondary | ICD-10-CM | POA: Diagnosis not present

## 2018-04-10 DIAGNOSIS — I10 Essential (primary) hypertension: Secondary | ICD-10-CM | POA: Diagnosis not present

## 2018-04-10 DIAGNOSIS — I482 Chronic atrial fibrillation, unspecified: Secondary | ICD-10-CM | POA: Diagnosis not present

## 2018-04-10 DIAGNOSIS — R600 Localized edema: Secondary | ICD-10-CM | POA: Diagnosis not present

## 2018-04-24 ENCOUNTER — Telehealth: Payer: Self-pay | Admitting: *Deleted

## 2018-04-24 NOTE — Telephone Encounter (Signed)
Called to notify pt that pt assistance Eliquis has arrived in office. No answer. Left msg to call back.

## 2018-05-06 NOTE — Telephone Encounter (Signed)
Pt called back, will pick up Eliquis today

## 2018-05-15 ENCOUNTER — Other Ambulatory Visit (HOSPITAL_COMMUNITY): Payer: Self-pay | Admitting: Family Medicine

## 2018-05-15 DIAGNOSIS — Z1231 Encounter for screening mammogram for malignant neoplasm of breast: Secondary | ICD-10-CM

## 2018-06-12 ENCOUNTER — Ambulatory Visit (HOSPITAL_COMMUNITY)
Admission: RE | Admit: 2018-06-12 | Discharge: 2018-06-12 | Disposition: A | Discharge status: Home / Self Care | Payer: Medicare Other | Source: Ambulatory Visit | Attending: Family Medicine | Admitting: Family Medicine

## 2018-06-12 ENCOUNTER — Encounter (HOSPITAL_COMMUNITY): Payer: Self-pay

## 2018-06-12 DIAGNOSIS — Z1231 Encounter for screening mammogram for malignant neoplasm of breast: Secondary | ICD-10-CM | POA: Diagnosis not present

## 2018-06-13 ENCOUNTER — Other Ambulatory Visit (HOSPITAL_COMMUNITY): Payer: Self-pay | Admitting: Family Medicine

## 2018-06-13 DIAGNOSIS — R928 Other abnormal and inconclusive findings on diagnostic imaging of breast: Secondary | ICD-10-CM

## 2018-07-01 ENCOUNTER — Encounter: Payer: Self-pay | Admitting: Cardiology

## 2018-07-01 ENCOUNTER — Ambulatory Visit (INDEPENDENT_AMBULATORY_CARE_PROVIDER_SITE_OTHER): Payer: Medicare Other | Admitting: Cardiology

## 2018-07-01 VITALS — BP 104/70 | HR 108 | Ht 66.0 in | Wt 280.0 lb

## 2018-07-01 DIAGNOSIS — I10 Essential (primary) hypertension: Secondary | ICD-10-CM

## 2018-07-01 DIAGNOSIS — I272 Pulmonary hypertension, unspecified: Secondary | ICD-10-CM | POA: Diagnosis not present

## 2018-07-01 DIAGNOSIS — I4819 Other persistent atrial fibrillation: Secondary | ICD-10-CM | POA: Diagnosis not present

## 2018-07-01 MED ORDER — DILTIAZEM HCL ER COATED BEADS 300 MG PO CP24: 300.0000 mg | ORAL_CAPSULE | Freq: Every day | ORAL | 3 refills | Status: AC

## 2018-07-01 MED ORDER — LISINOPRIL-HYDROCHLOROTHIAZIDE 20-12.5 MG PO TABS: ORAL_TABLET | ORAL | 3 refills | Status: DC

## 2018-07-01 NOTE — Progress Notes (Addendum)
Clinical Summary Destiny Nash is a 73 y.o.female seen today for follow up of the following medical problems.    1. Afib - new diagnosis during 02/2018 admission - resistant to cardiversion at last outpatient f/u  - some increased palpitations recently. Started around the time her husbands passing about 10 days ago - compliant with meds   2. Pulmonary HTN - ANA neg, HIV neg, LFTs normal - needs sleep study, PFTs - she was resistant to sleep study at last visit    SH: husband recently passed away just 10 days ago  Past Medical History:  Diagnosis Date  . Atrial fibrillation ([place])    a. diagnosed in 02/2018.  Marland Kitchen Cellulitis   . Diabetes mellitus without complication ([place])   . Hyperlipidemia   . Hypertension      Allergies  Allergen Reactions  . Codeine Other (See Comments)    Severe headache  . Levaquin [Levofloxacin] Other (See Comments)    Joint "seize" and constipation     Current Outpatient Medications  Medication Sig Dispense Refill  . apixaban (ELIQUIS) 5 MG TABS tablet Take 1 tablet (5 mg total) by mouth 2 (two) times daily. 60 tablet 0  . Cephalexin 500 MG tablet Take 1 tablet (500 mg total) by mouth 4 (four) times daily. 28 tablet 0  . diltiazem (CARDIZEM CD) 240 MG 24 hr capsule Take 1 capsule (240 mg total) by mouth daily. 30 capsule 11  . glyBURIDE (DIABETA) 5 MG tablet Take 10 mg by mouth 2 (two) times daily.  0  . insulin detemir (LEVEMIR) 100 UNIT/ML injection Inject 0.25 mLs (25 Units total) into the skin at bedtime. (Patient taking differently: Inject 14 Units into the skin at bedtime. ) 10 mL 0  . lisinopril-hydrochlorothiazide (PRINZIDE,ZESTORETIC) 20-12.5 MG tablet Take 1 tablet by mouth daily.  0  . metFORMIN (GLUCOPHAGE) 500 MG tablet Take 500 mg by mouth 2 (two) times daily with a meal.    . pravastatin (PRAVACHOL) 20 MG tablet TK 1 T PO QD  0   No current facility-administered medications for this visit.      Past Surgical History:    Procedure Laterality Date  . DILATION AND CURETTAGE OF UTERUS    . TONSILLECTOMY AND ADENOIDECTOMY       Allergies  Allergen Reactions  . Codeine Other (See Comments)    Severe headache  . Levaquin [Levofloxacin] Other (See Comments)    Joint "seize" and constipation      Family History  Problem Relation Age of Onset  . CAD Brother   . Hypertension Mother   . Hyperlipidemia Mother   . Hypertension Brother      Social History Ms. Destiny Nash reports that she has never smoked. She has never used smokeless tobacco. Ms. Destiny Nash reports previous alcohol use.   Review of Systems CONSTITUTIONAL: No weight loss, fever, chills, weakness or fatigue.  HEENT: Eyes: No visual loss, blurred vision, double vision or yellow sclerae.No hearing loss, sneezing, congestion, runny nose or sore throat.  SKIN: No rash or itching.  CARDIOVASCULAR: per hpi RESPIRATORY: No shortness of breath, cough or sputum.  GASTROINTESTINAL: No anorexia, nausea, vomiting or diarrhea. No abdominal pain or blood.  GENITOURINARY: No burning on urination, no polyuria NEUROLOGICAL: No headache, dizziness, syncope, paralysis, ataxia, numbness or tingling in the extremities. No change in bowel or bladder control.  MUSCULOSKELETAL: No muscle, back pain, joint pain or stiffness.  LYMPHATICS: No enlarged nodes. No history of splenectomy.  PSYCHIATRIC: No  history of depression or anxiety.  ENDOCRINOLOGIC: No reports of sweating, cold or heat intolerance. No polyuria or polydipsia.  Marland Kitchen   Physical Examination Vitals:   07/01/18 1041  BP: 104/70  Pulse: (!) 108  SpO2: 97%   Vitals:   07/01/18 1041  Weight: 280 lb (127 kg)  Height: 5' 6"  (1.676 m)    Gen: resting comfortably, no acute distress HEENT: no scleral icterus, pupils equal round and reactive, no palptable cervical adenopathy,  CV: irreg, rate 841, 2/6 systolic murmur rusb Resp: Clear to auscultation bilaterally GI: abdomen is soft, non-tender,  non-distended, normal bowel sounds, no hepatosplenomegaly MSK: extremities are warm, no edema.  Skin: warm, no rash Neuro:  no focal deficits Psych: appropriate affect    Assessment and Plan  1. Afib - EKG today shows afib rate 98 - some increased palpitations at home - increase dilt to 34m daily - we discussed cardioversion, she would like to wait for now given the recent passing of her husband. Reconsider at f/u  2. Pulmonary HTN - needs further work with sleep study and PFTs. Asking to wait on any testing due to recent passing of her husbrand - readdres at our f/u  3. HTN - lower prinzide to 10/6.25mg daily to allow room to increase dilt for her afib.  - Pending bps may do away with HCTZ component and use just low dose ACE-I at f/u. Mainly cut to 1/2 tablet of prinzide for convenience today given she has a 90 day supply at home already.    F/u 2 months   07/29/18 addendum Heard from patient's oncologist, they have asked for echos every 3 months to monitor her cardiac function after starting chemo for breast cancer. We will help monitor.    JArnoldo Nash M.D.

## 2018-07-01 NOTE — Patient Instructions (Signed)
Medication Instructions:  INCREASE DILTIAZEM TO 300 MG DAILY   DECREASE PRINZIDE TO 10-6.25 MG ONCE DAILY ( 1/2 TABLET)  Labwork: NONE  Testing/Procedures: NONE  Follow-Up: Your physician recommends that you schedule a follow-up appointment in: 2 MONTHS   Any Other Special Instructions Will Be Listed Below (If Applicable).     If you need a refill on your cardiac medications before your next appointment, please call your pharmacy.

## 2018-07-02 ENCOUNTER — Ambulatory Visit (HOSPITAL_COMMUNITY)
Admission: RE | Admit: 2018-07-02 | Discharge: 2018-07-02 | Disposition: A | Discharge status: Home / Self Care | Payer: Medicare Other | Source: Ambulatory Visit | Attending: Family Medicine | Admitting: Family Medicine

## 2018-07-02 ENCOUNTER — Other Ambulatory Visit (HOSPITAL_COMMUNITY): Payer: Self-pay | Admitting: Family Medicine

## 2018-07-02 DIAGNOSIS — R928 Other abnormal and inconclusive findings on diagnostic imaging of breast: Secondary | ICD-10-CM | POA: Insufficient documentation

## 2018-07-02 DIAGNOSIS — N6321 Unspecified lump in the left breast, upper outer quadrant: Secondary | ICD-10-CM | POA: Diagnosis not present

## 2018-07-09 ENCOUNTER — Ambulatory Visit (HOSPITAL_COMMUNITY)
Admission: RE | Admit: 2018-07-09 | Discharge: 2018-07-09 | Disposition: A | Discharge status: Home / Self Care | Payer: Medicare Other | Source: Ambulatory Visit | Attending: Family Medicine | Admitting: Family Medicine

## 2018-07-09 ENCOUNTER — Other Ambulatory Visit (HOSPITAL_COMMUNITY): Payer: Self-pay | Admitting: Family Medicine

## 2018-07-09 ENCOUNTER — Encounter (HOSPITAL_COMMUNITY): Payer: Self-pay

## 2018-07-09 DIAGNOSIS — R928 Other abnormal and inconclusive findings on diagnostic imaging of breast: Secondary | ICD-10-CM | POA: Diagnosis present

## 2018-07-09 DIAGNOSIS — Z170 Estrogen receptor positive status [ER+]: Secondary | ICD-10-CM | POA: Insufficient documentation

## 2018-07-09 DIAGNOSIS — C50412 Malignant neoplasm of upper-outer quadrant of left female breast: Secondary | ICD-10-CM | POA: Insufficient documentation

## 2018-07-09 DIAGNOSIS — C50411 Malignant neoplasm of upper-outer quadrant of right female breast: Secondary | ICD-10-CM | POA: Diagnosis not present

## 2018-07-09 DIAGNOSIS — N6321 Unspecified lump in the left breast, upper outer quadrant: Secondary | ICD-10-CM | POA: Diagnosis not present

## 2018-07-09 MED ORDER — LIDOCAINE HCL (PF) 1 % IJ SOLN
INTRAMUSCULAR | Status: AC
  Administered 2018-07-09: 5 mL
  Filled 2018-07-09: qty 5

## 2018-07-09 MED ORDER — LIDOCAINE-EPINEPHRINE (PF) 1 %-1:200000 IJ SOLN
INTRAMUSCULAR | Status: AC
  Administered 2018-07-09: 6 mL
  Filled 2018-07-09: qty 30

## 2018-07-11 DIAGNOSIS — C50912 Malignant neoplasm of unspecified site of left female breast: Secondary | ICD-10-CM | POA: Diagnosis not present

## 2018-07-11 DIAGNOSIS — Z7984 Long term (current) use of oral hypoglycemic drugs: Secondary | ICD-10-CM | POA: Diagnosis not present

## 2018-07-11 DIAGNOSIS — N183 Chronic kidney disease, stage 3 (moderate): Secondary | ICD-10-CM | POA: Diagnosis not present

## 2018-07-11 DIAGNOSIS — I4821 Permanent atrial fibrillation: Secondary | ICD-10-CM | POA: Diagnosis not present

## 2018-07-11 DIAGNOSIS — I10 Essential (primary) hypertension: Secondary | ICD-10-CM | POA: Diagnosis not present

## 2018-07-11 DIAGNOSIS — F5101 Primary insomnia: Secondary | ICD-10-CM | POA: Diagnosis not present

## 2018-07-11 DIAGNOSIS — E7800 Pure hypercholesterolemia, unspecified: Secondary | ICD-10-CM | POA: Diagnosis not present

## 2018-07-11 DIAGNOSIS — E1122 Type 2 diabetes mellitus with diabetic chronic kidney disease: Secondary | ICD-10-CM | POA: Diagnosis not present

## 2018-07-11 DIAGNOSIS — Z23 Encounter for immunization: Secondary | ICD-10-CM | POA: Diagnosis not present

## 2018-07-11 DIAGNOSIS — Z0000 Encounter for general adult medical examination without abnormal findings: Secondary | ICD-10-CM | POA: Diagnosis not present

## 2018-07-15 ENCOUNTER — Other Ambulatory Visit: Payer: Self-pay | Admitting: Surgery

## 2018-07-15 ENCOUNTER — Ambulatory Visit: Payer: Self-pay | Admitting: Surgery

## 2018-07-15 DIAGNOSIS — C50912 Malignant neoplasm of unspecified site of left female breast: Secondary | ICD-10-CM | POA: Diagnosis not present

## 2018-07-15 DIAGNOSIS — Z170 Estrogen receptor positive status [ER+]: Principal | ICD-10-CM

## 2018-07-15 NOTE — H&P (View-Only) (Signed)
Destiny Nash Documented: 07/15/2018 9:29 AM Location: [facility] Patient #: [id number] DOB: 03/23/1946 Widowed / Language: English / Race: White Female  History of Present Illness (Betty Brooks A. Charmel Pronovost MD; 07/15/2018 10:52 AM) Patient words: Patient sent at the request of Dr. Harris for abnormal mammogram. The patient went for a tree mammogram and a 2.7 cm upper inner quadrant mass was detected. 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 She has no family history of breast cancer other malignancies. She does take Eloquis for atrial fibrillation. Patient complains some mild bruising at the biopsy site but has no other complaints today.       ADDITIONAL INFORMATION: PROGNOSTIC INDICATORS Results: IMMUNOHISTOCHEMICAL AND MORPHOMETRIC ANALYSIS PERFORMED MANUALLY The tumor cells are POSITIVE for Her2 (3+). 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 1 of 2 FINAL for [place], Kimbrely A ([id number]) Microscopic Comment The invasive component appears grade 2. The in situ component is of high nuclear grade. A breast prognostic profile will be performed and the results reported separately. The results were reported to Dr. Jennifer Jarosz on 07/10/2018. Intradepartmental consultation was obtained (Dr. Butler). Anastasia Canacci MD Pathologist, Electronic Signature (Case signed 07/10/2018) Specimen Gross and Clinical Information Specimen(s) Obtained: Breast, left, needle  core biopsy, 1:00 Specimen Clinical Information left breast mass, malignancy Gross Received in formalin, time in formalin and cold ischemic time unknown, are three cores of tan yellow tissue measuring 1.5 to 1.7 cm in length and each 0.2 cm in diameter. The specimen is submitted in toto. (GP:gt, 07/09/18) Stain(s) used in Diagnosis: The following stain(s) were used in diagnosing the case: ER-ACIS, PR-ACIS, Her2 by IHC, KI-67-ACIS. The control(s) stained appropriately. Disclaimer Estrogen receptor (6F11), immunohistochemical stains are performed on formalin fixed                     CLINICAL DATA: Spiculated mass in the upper-outer left breast on a recent screening mammogram.  EXAM: DIGITAL DIAGNOSTIC LEFT MAMMOGRAM WITH TOMO  ULTRASOUND LEFT BREAST  COMPARISON: Previous exam(s).  ACR Breast Density Category b: There are scattered areas of fibroglandular density.  FINDINGS: 3D tomographic and 2D generated spot compression views of the left breast confirm an irregular, spiculated mass in the anterior aspect of the upper-outer quadrant of the breast.  On physical exam, there is an approximately 2.5 cm rounded, firm, palpable mass in the 1 o'clock position of the left breast, 4 cm from the nipple. There are no palpable left axillary lymph nodes.  Targeted ultrasound is performed, showing a 2.7 x 2.4 x 1.9 cm irregular, hypoechoic mass in the 1 o'clock position of the left breast, 4 cm from the nipple. This has surrounding ill-defined echogenic tissue and has internal blood flow with color Doppler.  Ultrasound of the left axilla demonstrated normal appearing left axillary lymph nodes.  IMPRESSION: 2.7 cm mass in the 1 o'clock position of the left breast with imaging features highly suspicious for malignancy.  RECOMMENDATION: Ultrasound-guided core needle biopsy of the 2.7 cm mass in the 1 o'clock position of the left breast. This has been discussed  the patient and scheduled at 3 p.m. on 07/09/2018.  I   have discussed the findings and recommendations with the patient. Results were also provided in writing at the conclusion of the visit. If applicable, a reminder letter will be sent to the patient regarding the next appointment.  BI-RADS CATEGORY 5: Highly suggestive of malignancy.   Electronically Signed By: Claudie Revering M.D. On: 07/02/2018 11:50               ADDITIONAL INFORMATION: PROGNOSTIC INDICATORS Results: IMMUNOHISTOCHEMICAL AND MORPHOMETRIC ANALYSIS PERFORMED MANUALLY The tumor cells are POSITIVE for Her2 (3+). 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 1 of 2 FINAL for [place], Cresencia A ([id number]) Microscopic Comment The invasive component appears grade 2. The in situ component is of high nuclear grade. A breast prognostic profile will be performed and the results reported separately. The results were reported to Dr. Everlean Alstrom on 07/10/2018. Intradepartmental consultation was obtained (Dr. Melina Copa). Gillie Manners MD Pathologist, Electronic Signature (Case signed 07/10/2018) Specimen Gross and Clinical Information Specimen(s) Obtained: Breast, left, needle core biopsy, 1:00 Specimen Clinical Information left breast mass, malignancy Gross Received in formalin, time in formalin and cold ischemic time unknown, are three cores of tan yellow tissue measuring 1.5 to 1.7 cm in length and each 0.2 cm in diameter. The  specimen is submitted in toto. (GP:gt, 07/09/18) Stain(s) used in Diagnosis: The following stain(s) were used in diagnosing the case: ER-ACIS, PR-ACIS, Her2 by IHC, KI-67-ACIS. The control(s) stained appropriately. Disclaimer Estrogen receptor (6F11), immunohistochemical stains are performed on formalin fixed, paraffin embedded tissue using a 3,3"-diaminobenzidine (DAB) chromogen and Leica Bond Autostainer System. The staining intensity of the nucleus is scored manually and is reported as the percentage of tumor cell nuclei demonstrating specific nuclear staining.Specimens are fixed in 10% Neutral Buffered Formalin for at least 6 hours and up to 72 hours. These tests have not be validated on decalcified tissue. Results should be interpreted with caution given the possibility of false negative results on decalcified specimens. IHC scores are reported using ASCO/CAP scoring criteria. An IHC Score of 0 or 1+ is NEGATIVE for HER2, 3+ is POSITIVE for HER2, and 2+ is EQUIVOCAL. Equivocal results are reflexed to either FISH or IHC testing. Specimens are fixed in 10% Neutral Buffered Formalin for at least 6 hours and up to 72 hours. These tests have not be validated on decalcified tissue. Results should be interpreted with caution given the possibility of false negative results on decalcified specimens. Ki-67 (MM1), immunohistochemical stains are performed on formalin fixed, paraffin embedded tissue using a 3,3"-diaminobenzidine (DAB) chromogen and Leica Bond Autostainer System. The staining intensity of the nucleus is scored manually and is reported as the percentage of tumor cell nuclei demonstrating specific nuclear staining.Specimens are fixed in 10% Neutral Buffered Formalin for at least 6 hours and up to 72 hours. These tests have not be validated.  The patient is a 73 year old female.   Past Surgical History Emeline Gins, CMA; 07/15/2018 9:29 AM) Breast Biopsy Left. Tonsillectomy  Diagnostic  Studies History Emeline Gins, [place]; 07/15/2018 9:29 AM) Colonoscopy never Mammogram within last year Pap Smear >5 years ago  Allergies Emeline Gins, CMA; 07/15/2018 9:31 AM) CODEINE Levaquin *FLUOROQUINOLONES* Allergies Reconciled  Medication History (  Emeline Gins, CMA; 07/15/2018 9:34 AM) Pravastatin Sodium (20MG Tablet, Oral) Active. metFORMIN HCl (500MG Tablet, Oral) Active. Eliquis (5MG Tablet, Oral) Active. Lisinopril (5MG Tablet, Oral) Active. Cardizem (30MG Tablet, Oral) Active. glyBURIDE (5MG Tablet, Oral) Active. Medications Reconciled  Social History Emeline Gins, [place]; 07/15/2018 9:29 AM) Alcohol use Remotely quit alcohol use. No caffeine use No drug use Tobacco use Never smoker.  Family History Emeline Gins, [place]; 07/15/2018 9:29 AM) Arthritis Mother. Bleeding disorder Mother. Breast Cancer Family Members In General. Cancer Family Members In General. Diabetes Mellitus Daughter, Son. Heart Disease Brother. Hypertension Brother, Mother, Son.  Pregnancy / Birth History Emeline Gins, [place]; 07/15/2018 9:29 AM) Age at menarche 65 years. Age of menopause 23-50 Gravida 63 Maternal age 42-25 Para 2  Other Problems Emeline Gins, [facility]; 07/15/2018 9:29 AM) Atrial Fibrillation Diabetes Mellitus Heart murmur High blood pressure Hypercholesterolemia     Review of Systems Emeline Gins CMA; 07/15/2018 9:29 AM) General Not Present- Appetite Loss, Chills, Fatigue, Fever, Night Sweats, Weight Gain and Weight Loss. Skin Not Present- Change in Wart/Mole, Dryness, Hives, Jaundice, New Lesions, Non-Healing Wounds, Rash and Ulcer. HEENT Not Present- Earache, Hearing Loss, Hoarseness, Nose Bleed, Oral Ulcers, Ringing in the Ears, Seasonal Allergies, Sinus Pain, Sore Throat, Visual Disturbances, Wears glasses/contact lenses and Yellow Eyes. Respiratory Not Present- Bloody sputum, Chronic Cough, Difficulty Breathing, Snoring  and Wheezing. Breast Not Present- Breast Mass, Breast Pain, Nipple Discharge and Skin Changes. Cardiovascular Present- Rapid Heart Rate. Not Present- Chest Pain, Difficulty Breathing Lying Down, Leg Cramps, Palpitations, Shortness of Breath and Swelling of Extremities. Gastrointestinal Not Present- Abdominal Pain, Bloating, Bloody Stool, Change in Bowel Habits, Chronic diarrhea, Constipation, Difficulty Swallowing, Excessive gas, Gets full quickly at meals, Hemorrhoids, Indigestion, Nausea, Rectal Pain and Vomiting. Female Genitourinary Not Present- Frequency, Nocturia, Painful Urination, Pelvic Pain and Urgency. Musculoskeletal Not Present- Back Pain, Joint Pain, Joint Stiffness, Muscle Pain, Muscle Weakness and Swelling of Extremities. Neurological Not Present- Decreased Memory, Fainting, Headaches, Numbness, Seizures, Tingling, Tremor, Trouble walking and Weakness. Psychiatric Not Present- Anxiety, Bipolar, Change in Sleep Pattern, Depression, Fearful and Frequent crying. Endocrine Present- Cold Intolerance. Not Present- Excessive Hunger, Hair Changes, Heat Intolerance, Hot flashes and New Diabetes. Hematology Present- Blood Thinners. Not Present- Easy Bruising, Excessive bleeding, Gland problems, HIV and Persistent Infections.  Vitals Emeline Gins CMA; 07/15/2018 9:30 AM) 07/15/2018 9:29 AM Weight: 280.2 lb Height: 64in Body Surface Area: 2.26 m Body Mass Index: 48.1 kg/m  Temp.: 97.59F  Pulse: 104 (Regular)  BP: 162/82 (Sitting, Left Arm, Standard)      Physical Exam (Deidre Carino A. Charod Slawinski MD; 07/15/2018 10:50 AM)  General Mental Status-Alert. General Appearance-Consistent with stated age. Hydration-Well hydrated. Voice-Normal.  Head and Neck Head-normocephalic, atraumatic with no lesions or palpable masses. Trachea-midline. Thyroid Gland Characteristics - normal size and consistency.  Eye Eyeball - Bilateral-Extraocular movements  intact. Sclera/Conjunctiva - Bilateral-No scleral icterus.  Chest and Lung Exam Chest and lung exam reveals -quiet, even and easy respiratory effort with no use of accessory muscles and on auscultation, normal breath sounds, no adventitious sounds and normal vocal resonance. Inspection Chest Wall - Normal. Back - normal.  Breast Note: Bruising left breast noted. No discernible mass. No nipple discharge. Right breast is normal.  Cardiovascular Cardiovascular examination reveals -normal heart sounds, regular rate and rhythm with no murmurs and normal pedal pulses bilaterally.  Neurologic Neurologic evaluation reveals -alert and oriented x 3 with no impairment of recent or remote memory. Mental Status-Normal.  Musculoskeletal Normal Exam - Left-Upper Extremity Strength Normal and  Lower Extremity Strength Normal. Normal Exam - Right-Upper Extremity Strength Normal and Lower Extremity Strength Normal.  Lymphatic Head & Neck  General Head & Neck Lymphatics: Bilateral - Description - Normal. Axillary  General Axillary Region: Bilateral - Description - Normal. Tenderness - Non Tender.    Assessment & Plan (Myrtha Tonkovich A. Aashi Derrington MD; 07/15/2018 10:51 AM)  BREAST CANCER, LEFT (C50.912) Impression: Pt requires port placement for chemotherapy. Risk include bleeding, infection, pneumothorax, hemothorax, mediastinal injury, nerve injury , blood vessel injury, strke, blood clots, death, migration. embolization and need for additional procedures. Pt agrees to proceed.   Refer to medical and radiation oncology for consideration of neoadjuvant chemotherapy  Patient desires breast conservation after neoadjuvant chemotherapy    Discussed port placement with the pros and cons and potential complications of this.  Requires MRI  Current Plans You are being scheduled for surgery- Our schedulers will call you.  You should hear from our office's scheduling department within 5  working days about the location, date, and time of surgery. We try to make accommodations for patient's preferences in scheduling surgery, but sometimes the OR schedule or the surgeon's schedule prevents [place] from making those accommodations.  If you have not heard from our office [phone]) in 5 working days, call the office and ask for your surgeon's nurse.  If you have other questions about your diagnosis, plan, or surgery, call the office and ask for your surgeon's nurse.  Pt Education - [facility] Breast Cancer Information Given - [employer] "Breast Journey" Package Pt Education - [facility] Breast Pains Education Use of a central venous catheter for intravenous therapy was discussed. Technique of catheter placement using ultrasound and fluoroscopy guidance was discussed. Risks such as bleeding, infection, pneumothorax, catheter occlusion, reoperation, and other risks were discussed. I noted a good likelihood this will help address the problem. Questions were answered. The patient expressed understanding & wishes to proceed.

## 2018-07-15 NOTE — H&P (Signed)
Destiny Nash Documented: 07/15/2018 9:29 AM Location: [facility] Patient #: [id number] DOB: 1945-12-15 Widowed / Language: Cleophus Molt / Race: White Female  History of Present Illness Marcello Moores Nash. Zell Hylton MD; 07/15/2018 10:52 AM) Patient words: Patient sent at the request of Dr. Kenton Kingfisher for abnormal mammogram. The patient went for Nash tree mammogram and Nash 2.7 cm upper inner quadrant mass was detected. 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 She has no family history of breast cancer other malignancies. She does take Eloquis for atrial fibrillation. Patient complains some mild bruising at the biopsy site but has no other complaints today.       ADDITIONAL INFORMATION: PROGNOSTIC INDICATORS Results: IMMUNOHISTOCHEMICAL AND MORPHOMETRIC ANALYSIS PERFORMED MANUALLY The tumor cells are POSITIVE for Her2 (3+). 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 1 of 2 FINAL for Destiny Nash ([id number]) Microscopic Comment The invasive component appears grade 2. The in situ component is of high nuclear grade. Nash breast prognostic profile will be performed and the results reported separately. The results were reported to Dr. Everlean Alstrom on 07/10/2018. Intradepartmental consultation was obtained (Dr. Melina Copa). Gillie Manners MD Pathologist, Electronic Signature (Case signed 07/10/2018) Specimen Gross and Clinical Information Specimen(s) Obtained: Breast, left, needle  core biopsy, 1:00 Specimen Clinical Information left breast mass, malignancy Gross Received in formalin, time in formalin and cold ischemic time unknown, are three cores of tan yellow tissue measuring 1.5 to 1.7 cm in length and each 0.2 cm in diameter. The specimen is submitted in toto. (GP:gt, 07/09/18) Stain(s) used in Diagnosis: The following stain(s) were used in diagnosing the case: ER-ACIS, PR-ACIS, Her2 by IHC, KI-67-ACIS. The control(s) stained appropriately. Disclaimer Estrogen receptor (6F11), immunohistochemical stains are performed on formalin fixed                     CLINICAL DATA: Spiculated mass in the upper-outer left breast on Nash recent screening mammogram.  EXAM: DIGITAL DIAGNOSTIC LEFT MAMMOGRAM WITH TOMO  ULTRASOUND LEFT BREAST  COMPARISON: Previous exam(s).  ACR Breast Density Category b: There are scattered areas of fibroglandular density.  FINDINGS: 3D tomographic and 2D generated spot compression views of the left breast confirm an irregular, spiculated mass in the anterior aspect of the upper-outer quadrant of the breast.  On physical exam, there is an approximately 2.5 cm rounded, firm, palpable mass in the 1 o'clock position of the left breast, 4 cm from the nipple. There are no palpable left axillary lymph nodes.  Targeted ultrasound is performed, showing Nash 2.7 x 2.4 x 1.9 cm irregular, hypoechoic mass in the 1 o'clock position of the left breast, 4 cm from the nipple. This has surrounding ill-defined echogenic tissue and has internal blood flow with color Doppler.  Ultrasound of the left axilla demonstrated normal appearing left axillary lymph nodes.  IMPRESSION: 2.7 cm mass in the 1 o'clock position of the left breast with imaging features highly suspicious for malignancy.  RECOMMENDATION: Ultrasound-guided core needle biopsy of the 2.7 cm mass in the 1 o'clock position of the left breast. This has been discussed  the patient and scheduled at 3 p.m. on 07/09/2018.  I  have discussed the findings and recommendations with the patient. Results were also provided in writing at the conclusion of the visit. If applicable, Nash reminder letter will be sent to the patient regarding the next appointment.  BI-RADS CATEGORY 5: Highly suggestive of malignancy.   Electronically Signed By: Claudie Revering M.D. On: 07/02/2018 11:50               ADDITIONAL INFORMATION: PROGNOSTIC INDICATORS Results: IMMUNOHISTOCHEMICAL AND MORPHOMETRIC ANALYSIS PERFORMED MANUALLY The tumor cells are POSITIVE for Her2 (3+). 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 1 of 2 FINAL for Destiny Nash ([id number]) Microscopic Comment The invasive component appears grade 2. The in situ component is of high nuclear grade. Nash breast prognostic profile will be performed and the results reported separately. The results were reported to Dr. Everlean Alstrom on 07/10/2018. Intradepartmental consultation was obtained (Dr. Melina Copa). Gillie Manners MD Pathologist, Electronic Signature (Case signed 07/10/2018) Specimen Gross and Clinical Information Specimen(s) Obtained: Breast, left, needle core biopsy, 1:00 Specimen Clinical Information left breast mass, malignancy Gross Received in formalin, time in formalin and cold ischemic time unknown, are three cores of tan yellow tissue measuring 1.5 to 1.7 cm in length and each 0.2 cm in diameter. The  specimen is submitted in toto. (GP:gt, 07/09/18) Stain(s) used in Diagnosis: The following stain(s) were used in diagnosing the case: ER-ACIS, PR-ACIS, Her2 by IHC, KI-67-ACIS. The control(s) stained appropriately. Disclaimer Estrogen receptor (6F11), immunohistochemical stains are performed on formalin fixed, paraffin embedded tissue using Nash 3,3"-diaminobenzidine (DAB) chromogen and Leica Bond Autostainer System. The staining intensity of the nucleus is scored manually and is reported as the percentage of tumor cell nuclei demonstrating specific nuclear staining.Specimens are fixed in 10% Neutral Buffered Formalin for at least 6 hours and up to 72 hours. These tests have not be validated on decalcified tissue. Results should be interpreted with caution given the possibility of false negative results on decalcified specimens. IHC scores are reported using ASCO/CAP scoring criteria. An IHC Score of 0 or 1+ is NEGATIVE for HER2, 3+ is POSITIVE for HER2, and 2+ is EQUIVOCAL. Equivocal results are reflexed to either FISH or IHC testing. Specimens are fixed in 10% Neutral Buffered Formalin for at least 6 hours and up to 72 hours. These tests have not be validated on decalcified tissue. Results should be interpreted with caution given the possibility of false negative results on decalcified specimens. Ki-67 (MM1), immunohistochemical stains are performed on formalin fixed, paraffin embedded tissue using Nash 3,3"-diaminobenzidine (DAB) chromogen and Leica Bond Autostainer System. The staining intensity of the nucleus is scored manually and is reported as the percentage of tumor cell nuclei demonstrating specific nuclear staining.Specimens are fixed in 10% Neutral Buffered Formalin for at least 6 hours and up to 72 hours. These tests have not be validated.  The patient is Nash 73 year old female.   Past Surgical History Emeline Gins, CMA; 07/15/2018 9:29 AM) Breast Biopsy Left. Tonsillectomy  Diagnostic  Studies History Emeline Gins, [place]; 07/15/2018 9:29 AM) Colonoscopy never Mammogram within last year Pap Smear >5 years ago  Allergies Emeline Gins, CMA; 07/15/2018 9:31 AM) CODEINE Levaquin *FLUOROQUINOLONES* Allergies Reconciled  Medication History (  Emeline Gins, CMA; 07/15/2018 9:34 AM) Pravastatin Sodium (20MG Tablet, Oral) Active. metFORMIN HCl (500MG Tablet, Oral) Active. Eliquis (5MG Tablet, Oral) Active. Lisinopril (5MG Tablet, Oral) Active. Cardizem (30MG Tablet, Oral) Active. glyBURIDE (5MG Tablet, Oral) Active. Medications Reconciled  Social History Emeline Gins, [place]; 07/15/2018 9:29 AM) Alcohol use Remotely quit alcohol use. No caffeine use No drug use Tobacco use Never smoker.  Family History Emeline Gins, [place]; 07/15/2018 9:29 AM) Arthritis Mother. Bleeding disorder Mother. Breast Cancer Family Members In General. Cancer Family Members In General. Diabetes Mellitus Daughter, Son. Heart Disease Brother. Hypertension Brother, Mother, Son.  Pregnancy / Birth History Emeline Gins, [place]; 07/15/2018 9:29 AM) Age at menarche 65 years. Age of menopause 23-50 Gravida 63 Maternal age 42-25 Para 2  Other Problems Emeline Gins, [facility]; 07/15/2018 9:29 AM) Atrial Fibrillation Diabetes Mellitus Heart murmur High blood pressure Hypercholesterolemia     Review of Systems Emeline Gins CMA; 07/15/2018 9:29 AM) General Not Present- Appetite Loss, Chills, Fatigue, Fever, Night Sweats, Weight Gain and Weight Loss. Skin Not Present- Change in Wart/Mole, Dryness, Hives, Jaundice, New Lesions, Non-Healing Wounds, Rash and Ulcer. HEENT Not Present- Earache, Hearing Loss, Hoarseness, Nose Bleed, Oral Ulcers, Ringing in the Ears, Seasonal Allergies, Sinus Pain, Sore Throat, Visual Disturbances, Wears glasses/contact lenses and Yellow Eyes. Respiratory Not Present- Bloody sputum, Chronic Cough, Difficulty Breathing, Snoring  and Wheezing. Breast Not Present- Breast Mass, Breast Pain, Nipple Discharge and Skin Changes. Cardiovascular Present- Rapid Heart Rate. Not Present- Chest Pain, Difficulty Breathing Lying Down, Leg Cramps, Palpitations, Shortness of Breath and Swelling of Extremities. Gastrointestinal Not Present- Abdominal Pain, Bloating, Bloody Stool, Change in Bowel Habits, Chronic diarrhea, Constipation, Difficulty Swallowing, Excessive gas, Gets full quickly at meals, Hemorrhoids, Indigestion, Nausea, Rectal Pain and Vomiting. Female Genitourinary Not Present- Frequency, Nocturia, Painful Urination, Pelvic Pain and Urgency. Musculoskeletal Not Present- Back Pain, Joint Pain, Joint Stiffness, Muscle Pain, Muscle Weakness and Swelling of Extremities. Neurological Not Present- Decreased Memory, Fainting, Headaches, Numbness, Seizures, Tingling, Tremor, Trouble walking and Weakness. Psychiatric Not Present- Anxiety, Bipolar, Change in Sleep Pattern, Depression, Fearful and Frequent crying. Endocrine Present- Cold Intolerance. Not Present- Excessive Hunger, Hair Changes, Heat Intolerance, Hot flashes and New Diabetes. Hematology Present- Blood Thinners. Not Present- Easy Bruising, Excessive bleeding, Gland problems, HIV and Persistent Infections.  Vitals Emeline Gins CMA; 07/15/2018 9:30 AM) 07/15/2018 9:29 AM Weight: 280.2 lb Height: 64in Body Surface Area: 2.26 m Body Mass Index: 48.1 kg/m  Temp.: 97.59F  Pulse: 104 (Regular)  BP: 162/82 (Sitting, Left Arm, Standard)      Physical Exam (Trey Gulbranson Nash. Niko Jakel MD; 07/15/2018 10:50 AM)  General Mental Status-Alert. General Appearance-Consistent with stated age. Hydration-Well hydrated. Voice-Normal.  Head and Neck Head-normocephalic, atraumatic with no lesions or palpable masses. Trachea-midline. Thyroid Gland Characteristics - normal size and consistency.  Eye Eyeball - Bilateral-Extraocular movements  intact. Sclera/Conjunctiva - Bilateral-No scleral icterus.  Chest and Lung Exam Chest and lung exam reveals -quiet, even and easy respiratory effort with no use of accessory muscles and on auscultation, normal breath sounds, no adventitious sounds and normal vocal resonance. Inspection Chest Wall - Normal. Back - normal.  Breast Note: Bruising left breast noted. No discernible mass. No nipple discharge. Right breast is normal.  Cardiovascular Cardiovascular examination reveals -normal heart sounds, regular rate and rhythm with no murmurs and normal pedal pulses bilaterally.  Neurologic Neurologic evaluation reveals -alert and oriented x 3 with no impairment of recent or remote memory. Mental Status-Normal.  Musculoskeletal Normal Exam - Left-Upper Extremity Strength Normal and  Lower Extremity Strength Normal. Normal Exam - Right-Upper Extremity Strength Normal and Lower Extremity Strength Normal.  Lymphatic Head & Neck  General Head & Neck Lymphatics: Bilateral - Description - Normal. Axillary  General Axillary Region: Bilateral - Description - Normal. Tenderness - Non Tender.    Assessment & Plan (Renner Sebald Nash. Malisa Ruggiero MD; 07/15/2018 10:51 AM)  BREAST CANCER, LEFT (C50.912) Impression: Pt requires port placement for chemotherapy. Risk include bleeding, infection, pneumothorax, hemothorax, mediastinal injury, nerve injury , blood vessel injury, strke, blood clots, death, migration. embolization and need for additional procedures. Pt agrees to proceed.   Refer to medical and radiation oncology for consideration of neoadjuvant chemotherapy  Patient desires breast conservation after neoadjuvant chemotherapy    Discussed port placement with the pros and cons and potential complications of this.  Requires MRI  Current Plans You are being scheduled for surgery- Our schedulers will call you.  You should hear from our office's scheduling department within 5  working days about the location, date, and time of surgery. We try to make accommodations for patient's preferences in scheduling surgery, but sometimes the OR schedule or the surgeon's schedule prevents [place] from making those accommodations.  If you have not heard from our office [phone]) in 5 working days, call the office and ask for your surgeon's nurse.  If you have other questions about your diagnosis, plan, or surgery, call the office and ask for your surgeon's nurse.  Pt Education - [facility] Breast Cancer Information Given - [facility] "Breast Journey" Package Pt Education - [facility] Breast Pains Education Use of Nash central venous catheter for intravenous therapy was discussed. Technique of catheter placement using ultrasound and fluoroscopy guidance was discussed. Risks such as bleeding, infection, pneumothorax, catheter occlusion, reoperation, and other risks were discussed. I noted Nash good likelihood this will help address the problem. Questions were answered. The patient expressed understanding & wishes to proceed.

## 2018-07-18 ENCOUNTER — Ambulatory Visit
Admission: RE | Admit: 2018-07-18 | Discharge: 2018-07-18 | Disposition: A | Discharge status: Home / Self Care | Payer: Medicare Other | Source: Ambulatory Visit | Attending: Surgery | Admitting: Surgery

## 2018-07-18 DIAGNOSIS — C50912 Malignant neoplasm of unspecified site of left female breast: Secondary | ICD-10-CM

## 2018-07-18 DIAGNOSIS — Z170 Estrogen receptor positive status [ER+]: Principal | ICD-10-CM

## 2018-07-18 DIAGNOSIS — D0512 Intraductal carcinoma in situ of left breast: Secondary | ICD-10-CM | POA: Diagnosis not present

## 2018-07-18 MED ORDER — GADOBUTROL 1 MMOL/ML IV SOLN
10.0000 mL | Freq: Once | INTRAVENOUS | Status: AC | PRN
  Administered 2018-07-18: 10 mL via INTRAVENOUS

## 2018-07-19 ENCOUNTER — Encounter: Payer: Self-pay | Admitting: Hematology and Oncology

## 2018-07-23 NOTE — Progress Notes (Signed)
Location of Breast Cancer: Left Breast  Histology per Pathology Report:  07/09/18 Diagnosis Breast, left, needle core biopsy, 1:00 - INVASIVE DUCTAL CARCINOMA. - DUCTAL CARCINOMA IN SITU.  Receptor Status: ER(100%), PR (70%), Her2-neu (POS)  Did patient present with symptoms or was this found on screening mammography?: It was found on a screening mammogram.   Past/Anticipated interventions by surgeon, if any: Dr. Brantley Stage saw on 07/15/18. He referred her to medical and radiation oncology.   Past/Anticipated interventions by medical oncology, if any:  07/25/18 Dr. Lindi Adie  Recommendation based on multidisciplinary tumor board: 1. Neoadjuvant chemotherapy with Taxol weekly x12 Herceptin Perjeta every 3 weeks followed by Herceptin and Perjeta versus Kadcyla maintenance for 1 year 2. Followed by breast conserving surgery if possible with sentinel lymph node study 3. Followed by adjuvant radiation therapy if patient had lumpectomy 4.  Followed by adjuvant antiestrogen therapy Plan: 1. Port placement 2. Echocardiogram 3. Chemotherapy class  Return to clinic in 2 weeks to start chemotherapy  Lymphedema issues, if any:  N/A  Pain issues, if any:  She denies.   SAFETY ISSUES:  Prior radiation? No  Pacemaker/ICD? No  Possible current pregnancy? No  Is the patient on methotrexate? No  Current Complaints / other details:    BP (!) 165/77   Pulse (!) 101   Temp 98.1 F (36.7 C) (Oral)   Ht _0  (1.676 m)   Wt 287 lb 3.2 oz (130.3 kg)   SpO2 100% Comment: room air  BMI 46.36 kg/m    Wt Readings from Last 3 Encounters:  07/30/18 287 lb 3.2 oz (130.3 kg)  07/25/18 287 lb 3.2 oz (130.3 kg)  07/01/18 280 lb (127 kg)      Destiny Nash, Destiny Police, RN 07/23/2018,10:23 AM

## 2018-07-24 NOTE — Progress Notes (Signed)
[facility] CONSULT NOTE  Patient Care Team: [name], MD as PCP - General (Family Medicine) Harl Bowie Alphonse Guild, MD as PCP - Cardiology (Cardiology)   CHIEF COMPLAINTS/PURPOSE OF CONSULTATION: Newly diagnosed breast cancer  HISTORY OF PRESENTING ILLNESS:  Destiny Nash 73 y.o. 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 The cancer was detected on a routine screening mammogram on 06/12/18 and was not palpable prior to diagnosis. A diagnostic mammogram on 07/02/18 showed a 2.7cm mass at the 1 o'clock position of the left breast. A biopsy from 07/09/18 showed the cancer to be grade 2 IDC with high grade DCIS, HER2 positive, ER 100%, PR 70%, Ki67 10%. A breast MRI on 07/18/18 confirmed the mass to be 2.6cm and showed no evidence of axillary lymphadenopathy or malignancy in the right breast.   She presents to the clinic today with her daughter and son-in-law. She reports a family history of breast cancer in a cousin before age 2 and ovarian cancer in her grandmother. She has a history of diabetes not well controlled, high blood pressure, stage 3 kidney disease, and atrial fibrillation. She is currently on Eliquis and Metformin. She reports she is still sore from a breast MRI a week ago and does not want to undergo another. She denies any numbness in hands or feet. She reports she easily has diarrhea, often when she is nervous. She met with Dr. Brantley Stage last week. She will be monitored by her cardiologist, Dr. Carlyle Dolly, during treatment.   I reviewed her records extensively and collaborated the history with the patient.  SUMMARY OF ONCOLOGIC HISTORY:   Malignant neoplasm of upper-outer quadrant of left breast in female, estrogen receptor positive ([place])   07/09/2018 Initial Diagnosis    Screening mammogram detected spiculated mass in the left breast upper outer quadrant, 2.6 cm by MRI, axilla negative, biopsy  revealed grade 2 IDC with DCIS, ER 100%, PR 70%, Ki-67 10%, HER-2 3+ positive, T2N0 stage Ib clinical stage    07/25/2018 Cancer Staging    Staging form: Breast, AJCC 8th Edition - Clinical stage from 07/25/2018: Stage IB (cT2, cN0, cM0, G2, ER+, PR+, HER2+) - Signed by Nicholas Lose, MD on 07/25/2018    MEDICAL HISTORY:  Past Medical History:  Diagnosis Date  . Atrial fibrillation ([place])    a. diagnosed in 02/2018.  Marland Kitchen Cellulitis   . Diabetes mellitus without complication ([place])   . Hyperlipidemia   . Hypertension     SURGICAL HISTORY: Past Surgical History:  Procedure Laterality Date  . DILATION AND CURETTAGE OF UTERUS    . TONSILLECTOMY AND ADENOIDECTOMY      SOCIAL HISTORY: Social History   Socioeconomic History  . Marital status: Widowed    Spouse name: Not on file  . Number of children: Not on file  . Years of education: Not on file  . Highest education level: Not on file  Occupational History  . Not on file  Social Needs  . Financial resource strain: Not on file  . Food insecurity:    Worry: Not on file    Inability: Not on file  . Transportation needs:    Medical: Not on file    Non-medical: Not on file  Tobacco Use  . Smoking status: Never Smoker  . Smokeless tobacco: Never Used  Substance and Sexual Activity  . Alcohol use: Not Currently  . Drug use: Not Currently  . Sexual  activity: Not Currently  Lifestyle  . Physical activity:    Days per week: Not on file    Minutes per session: Not on file  . Stress: Not on file  Relationships  . Social connections:    Talks on phone: Not on file    Gets together: Not on file    Attends religious service: Not on file    Active member of club or organization: Not on file    Attends meetings of clubs or organizations: Not on file    Relationship status: Not on file  . Intimate partner violence:    Fear of current or ex partner: Not on file    Emotionally abused: Not on file    Physically abused: Not on file     Forced sexual activity: Not on file  Other Topics Concern  . Not on file  Social History Narrative  . Not on file    FAMILY HISTORY: Family History  Problem Relation Age of Onset  . CAD Brother   . Hypertension Mother   . Hyperlipidemia Mother   . Hypertension Brother     ALLERGIES:  is allergic to codeine and levaquin [levofloxacin].  MEDICATIONS:  Current Outpatient Medications  Medication Sig Dispense Refill  . apixaban (ELIQUIS) 5 MG TABS tablet Take 1 tablet (5 mg total) by mouth 2 (two) times daily. 60 tablet 0  . diltiazem (CARDIZEM CD) 300 MG 24 hr capsule Take 1 capsule (300 mg total) by mouth daily. 90 capsule 3  . glyBURIDE (DIABETA) 5 MG tablet Take 10 mg by mouth 2 (two) times daily.  0  . insulin detemir (LEVEMIR) 100 UNIT/ML injection Inject 0.25 mLs (25 Units total) into the skin at bedtime. (Patient taking differently: Inject 14 Units into the skin at bedtime. ) 10 mL 0  . lisinopril-hydrochlorothiazide (PRINZIDE,ZESTORETIC) 20-12.5 MG tablet Take 1/2 tablet (10-6.25 mg) once daily. 45 tablet 3  . metFORMIN (GLUCOPHAGE) 500 MG tablet Take 500 mg by mouth 2 (two) times daily with a meal.    . pravastatin (PRAVACHOL) 20 MG tablet TK 1 T PO QD  0   No current facility-administered medications for this visit.     REVIEW OF SYSTEMS:   Constitutional: Denies fevers, chills or abnormal night sweats Eyes: Denies blurriness of vision, double vision or watery eyes Ears, nose, mouth, throat, and face: Denies mucositis or sore throat Respiratory: Denies cough, dyspnea or wheezes Cardiovascular: Denies palpitation, chest discomfort or lower extremity swelling Gastrointestinal:  Denies nausea, heartburn or change in bowel habits Skin: Denies abnormal skin rashes Lymphatics: Denies new lymphadenopathy or easy bruising Neurological:Denies numbness, tingling or new weaknesses Behavioral/Psych: Mood is stable, no new changes  Breast: Denies any palpable lumps or discharge  (+) soreness following MRI All other systems were reviewed with the patient and are negative.  PHYSICAL EXAMINATION: ECOG PERFORMANCE STATUS: 1 - Symptomatic but completely ambulatory  Vitals:   07/25/18 1246  BP: (!) 160/86  Pulse: 78  Resp: 18  Temp: 97.6 F (36.4 C)  SpO2: 100%   Filed Weights   07/25/18 1246  Weight: 287 lb 3.2 oz (130.3 kg)    GENERAL:alert, no distress and comfortable SKIN: skin color, texture, turgor are normal, no rashes or significant lesions EYES: normal, conjunctiva are pink and non-injected, sclera clear OROPHARYNX:no exudate, no erythema and lips, buccal mucosa, and tongue normal  NECK: supple, thyroid normal size, non-tender, without nodularity LYMPH:  no palpable lymphadenopathy in the cervical, axillary or inguinal LUNGS: clear to  auscultation and percussion with normal breathing effort HEART: regular rate & rhythm and no murmurs and no lower extremity edema ABDOMEN:abdomen soft, non-tender and normal bowel sounds Musculoskeletal:no cyanosis of digits and no clubbing  PSYCH: alert & oriented x 3 with fluent speech NEURO: no focal motor/sensory deficits  LABORATORY DATA:  I have reviewed the data as listed Lab Results  Component Value Date   WBC 16.3 (H) 02/15/2018   HGB 10.5 (L) 02/15/2018   HCT 32.1 (L) 02/15/2018   MCV 78.1 02/15/2018   PLT 254 02/15/2018   Lab Results  Component Value Date   NA 136 02/15/2018   K 3.4 (L) 02/15/2018   CL 102 02/15/2018   CO2 25 02/15/2018    RADIOGRAPHIC STUDIES: I have personally reviewed the radiological reports and agreed with the findings in the report.  ASSESSMENT AND PLAN:  Malignant neoplasm of upper-outer quadrant of left breast in female, estrogen receptor positive ([place]) 07/09/2018: Screening mammogram detected spiculated mass in the left breast upper outer quadrant, 2.6 cm by MRI, axilla negative, biopsy revealed grade 2 IDC with DCIS, ER 100%, PR 70%, Ki-67 10%, HER-2 3+ positive,  T2N0 stage Ib clinical stage  Pathology and radiology counseling: Discussed with the patient, the details of pathology including the type of breast cancer,the clinical staging, the significance of ER, PR and HER-2/neu receptors and the implications for treatment. After reviewing the pathology in detail, we proceeded to discuss the different treatment options between surgery, radiation, chemotherapy, antiestrogen therapies.  Recommendation based on multidisciplinary tumor board: 1. Neoadjuvant chemotherapy with Taxol weekly x12 Herceptin Perjeta every 3 weeks followed by Herceptin and Perjeta versus Kadcyla maintenance for 1 year 2. Followed by breast conserving surgery if possible with sentinel lymph node study 3. Followed by adjuvant radiation therapy if patient had lumpectomy 4.  Followed by adjuvant antiestrogen therapy  Chemotherapy Counseling: I discussed the risks and benefits of chemotherapy including the risks of nausea/ vomiting, risk of infection from low WBC count, fatigue due to chemo or anemia, bruising or bleeding due to low platelets, mouth sores, loss/ change in taste and decreased appetite. Liver and kidney function will be monitored through out chemotherapy as abnormalities in liver and kidney function may be a side effect of treatment. Cardiac dysfunction due to Herceptin and Perjeta were discussed in detail. Risk of permanent bone marrow dysfunction due to chemo were also discussed.  Plan: 1. Port placement 2. Echocardiogram 3. Chemotherapy class  Return to clinic in 2 weeks to start chemotherapy.  All questions were answered. The patient knows to call the clinic with any problems, questions or concerns.   Nicholas Lose, MD 07/25/2018   Julious Oka Dorshimer, am acting as scribe for Nicholas Lose, MD.  I have reviewed the above documentation for accuracy and completeness, and I agree with the above.

## 2018-07-25 ENCOUNTER — Telehealth: Payer: Self-pay | Admitting: Hematology and Oncology

## 2018-07-25 ENCOUNTER — Inpatient Hospital Stay: Payer: Medicare Other | Attending: Hematology and Oncology | Admitting: Hematology and Oncology

## 2018-07-25 ENCOUNTER — Encounter: Payer: Self-pay | Admitting: *Deleted

## 2018-07-25 ENCOUNTER — Other Ambulatory Visit: Payer: Self-pay | Admitting: *Deleted

## 2018-07-25 VITALS — BP 160/86 | HR 78 | Temp 97.6°F | Resp 18 | Ht 66.0 in | Wt 287.2 lb

## 2018-07-25 DIAGNOSIS — I4891 Unspecified atrial fibrillation: Secondary | ICD-10-CM | POA: Diagnosis not present

## 2018-07-25 DIAGNOSIS — Z170 Estrogen receptor positive status [ER+]: Principal | ICD-10-CM

## 2018-07-25 DIAGNOSIS — N183 Chronic kidney disease, stage 3 (moderate): Secondary | ICD-10-CM | POA: Insufficient documentation

## 2018-07-25 DIAGNOSIS — Z5111 Encounter for antineoplastic chemotherapy: Secondary | ICD-10-CM | POA: Diagnosis not present

## 2018-07-25 DIAGNOSIS — Z79899 Other long term (current) drug therapy: Secondary | ICD-10-CM | POA: Diagnosis not present

## 2018-07-25 DIAGNOSIS — I10 Essential (primary) hypertension: Secondary | ICD-10-CM

## 2018-07-25 DIAGNOSIS — Z7901 Long term (current) use of anticoagulants: Secondary | ICD-10-CM

## 2018-07-25 DIAGNOSIS — E119 Type 2 diabetes mellitus without complications: Secondary | ICD-10-CM | POA: Diagnosis not present

## 2018-07-25 DIAGNOSIS — C50412 Malignant neoplasm of upper-outer quadrant of left female breast: Secondary | ICD-10-CM | POA: Insufficient documentation

## 2018-07-25 DIAGNOSIS — Z803 Family history of malignant neoplasm of breast: Secondary | ICD-10-CM | POA: Insufficient documentation

## 2018-07-25 DIAGNOSIS — Z8543 Personal history of malignant neoplasm of ovary: Secondary | ICD-10-CM | POA: Diagnosis not present

## 2018-07-25 DIAGNOSIS — Z5112 Encounter for antineoplastic immunotherapy: Secondary | ICD-10-CM | POA: Insufficient documentation

## 2018-07-25 MED ORDER — LORAZEPAM 0.5 MG PO TABS: 0.5000 mg | ORAL_TABLET | Freq: Every evening | ORAL | 0 refills | Status: DC | PRN

## 2018-07-25 MED ORDER — PROCHLORPERAZINE MALEATE 10 MG PO TABS: 10.0000 mg | ORAL_TABLET | Freq: Four times a day (QID) | ORAL | 1 refills | Status: DC | PRN

## 2018-07-25 MED ORDER — LIDOCAINE-PRILOCAINE 2.5-2.5 % EX CREA: TOPICAL_CREAM | CUTANEOUS | 3 refills | Status: DC

## 2018-07-25 MED ORDER — ONDANSETRON HCL 8 MG PO TABS: 8.0000 mg | ORAL_TABLET | Freq: Two times a day (BID) | ORAL | 1 refills | Status: DC | PRN

## 2018-07-25 NOTE — Assessment & Plan Note (Signed)
07/09/2018: Screening mammogram detected spiculated mass in the left breast upper outer quadrant, 2.6 cm by MRI, axilla negative, biopsy revealed grade 2 IDC with DCIS, ER 100%, PR 70%, Ki-67 10%, HER-2 3+ positive, T2N0 stage Ib clinical stage  Pathology and radiology counseling: Discussed with the patient, the details of pathology including the type of breast cancer,the clinical staging, the significance of ER, PR and HER-2/neu receptors and the implications for treatment. After reviewing the pathology in detail, we proceeded to discuss the different treatment options between surgery, radiation, chemotherapy, antiestrogen therapies.  Recommendation based on multidisciplinary tumor board: 1. Neoadjuvant chemotherapy with TCH Perjeta 6 cycles followed by Herceptin and Perjeta versus Kadcyla maintenance for 1 year 2. Followed by breast conserving surgery if possible with sentinel lymph node study 3. Followed by adjuvant radiation therapy if patient had lumpectomy  Chemotherapy Counseling: I discussed the risks and benefits of chemotherapy including the risks of nausea/ vomiting, risk of infection from low WBC count, fatigue due to chemo or anemia, bruising or bleeding due to low platelets, mouth sores, loss/ change in taste and decreased appetite. Liver and kidney function will be monitored through out chemotherapy as abnormalities in liver and kidney function may be a side effect of treatment. Cardiac dysfunction due to Herceptin and Perjeta were discussed in detail. Risk of permanent bone marrow dysfunction due to chemo were also discussed.  Plan: 1. Port placement 2. Echocardiogram 3. Chemotherapy class  UPBEAT clinical trial ([id number]): Newly diagnosed stage I to III breast cancer patients receiving either adjuvant or neoadjuvant chemotherapy undergo cardiac MRI before treatment and at 24 months along with neurocognitive testing, exercise and disability measures at baseline 3, 12 and 24  months.  Return to clinic in 2 weeks to start chemotherapy.

## 2018-07-25 NOTE — Progress Notes (Signed)
START OFF PATHWAY REGIMEN - Breast   [id number]:Weekly paclitaxel + (trastuzumab + pertuzumab q21 days):   A cycle is every 21 days:     Pertuzumab      Pertuzumab      Trastuzumab-xxxx      Trastuzumab-xxxx      Paclitaxel   **Always confirm dose/schedule in your pharmacy ordering system**  Patient Characteristics: Preoperative or Nonsurgical Candidate (Clinical Staging), Neoadjuvant Therapy followed by Surgery, Invasive Disease, Chemotherapy, HER2 Positive, ER Positive Therapeutic Status: Preoperative or Nonsurgical Candidate (Clinical Staging) AJCC M Category: cM0 AJCC Grade: G2 Breast Surgical Plan: Neoadjuvant Therapy followed by Surgery ER Status: Positive (+) AJCC 8 Stage Grouping: IB HER2 Status: Positive (+) AJCC T Category: cT2 AJCC N Category: cN0 PR Status: Positive (+) Intent of Therapy: Curative Intent, Discussed with Patient

## 2018-07-25 NOTE — Telephone Encounter (Signed)
No los °

## 2018-07-26 ENCOUNTER — Telehealth: Payer: Self-pay | Admitting: Hematology and Oncology

## 2018-07-26 NOTE — Telephone Encounter (Signed)
Scheduled appt per 2/21 sch message - unable to reach patient - left message for patient for appts next week on 2/25 and 2/28

## 2018-07-29 NOTE — Pre-Procedure Instructions (Signed)
Destiny Nash  07/29/2018      [facility], [place] - [id number] [place] [address] Phone: [phone] Fax: [phone]    Your procedure is scheduled on 08-01-2008  Thursday .  Report to [facility] Admitting at 9:00 A.M.   Call this number if you have problems the morning of surgery:  [phone]   Remember:  Do not eat  after midnight.  You may drink clear liquids until 8:00 AM .  Clear liquids allowed are:                    Water, Juice (non-citric and without pulp), Carbonated beverages, Clear Tea, Black Coffee only and Gatorade    Take these medicines the morning of surgery with A SIP OF WATER        Tylenol if needed Albuterol inhaler if needed,bring with you to hospital Diltiazem(Cardizem ) Pravastatin(Pravachol)  Follow your doctors instructions regarding your Aspirin and Eliquis, If no instructions were given by your doctor ,then you will need to call the prescribing office to get instructions.      How to Manage Your Diabetes Before and After Surgery  Why is it important to control my blood sugar before and after surgery? . Improving blood sugar levels before and after surgery helps healing and can limit problems. . A way of improving blood sugar control is eating a healthy diet by: o  Eating less sugar and carbohydrates o  Increasing activity/exercise o  Talking with your doctor about reaching your blood sugar goals . High blood sugars (greater than 180 mg/dL) can raise your risk of infections and slow your recovery, so you will need to focus on controlling your diabetes during the weeks before surgery. . Make sure that the doctor who takes care of your diabetes knows about your planned surgery including the date and location.  How do I manage my blood sugar before surgery? . Check your blood sugar at least 4 times a day, starting 2 days before surgery, to make sure that the level is not  too high or low. o Check your blood sugar the morning of your surgery when you wake up and every 2 hours until you get to the Short Stay unit. . If your blood sugar is less than 70 mg/dL, you will need to treat for low blood sugar: o Do not take insulin. o Treat a low blood sugar (less than 70 mg/dL) with  cup of clear juice (cranberry or apple), 4 glucose tablets, OR glucose gel. Recheck blood sugar in 15 minutes after treatment (to make sure it is greater than 70 mg/dL). If your blood sugar is not greater than 70 mg/dL on recheck, call [phone] o  for further instructions. . Report your blood sugar to the short stay nurse when you get to Short Stay.  . If you are admitted to the hospital after surgery: o Your blood sugar will be checked by the staff and you will probably be given insulin after surgery (instead of oral diabetes medicines) to make sure you have good blood sugar levels. o The goal for blood sugar control after surgery is 80-180 mg/dL.              WHAT DO I DO ABOUT MY DIABETES MEDICATION?   Marland Kitchen Do not take oral diabetes medicines (pills) the morning of surgery.       Metformin(Glucophage),Glyburide(Diabeta)     .  THE NIGHT BEFORE SURGERY, take 7 units of Levemir insulin.       . THE MORNING OF SURGERY, take _____________ units of __________insulin.  . The day of surgery, do not take other diabetes injectables, including Byetta (exenatide), Bydureon (exenatide ER), Victoza (liraglutide), or Trulicity (dulaglutide).  . If your CBG is greater than 220 mg/dL, you may take  of your sliding scale (correction) dose of insulin.  Other Instructions:          Patient Signature:  Date:   Nurse Signature:  Date:   Reviewed and Endorsed by [facility] Patient Education Committee, August 2015     Do not wear jewelry, make-up or nail polish.  Do not wear lotions, powders, or perfumes, or deodorant.  Do not shave 48 hours prior to surgery.  .  Do not  bring valuables to the hospital.  [facility] is not responsible for any belongings or valuables.  Contacts, dentures or bridgework may not be worn into surgery.  Leave your suitcase in the car.  After surgery it may be brought to your room.  For patients admitted to the hospital, discharge time will be determined by your treatment team.  Patients discharged the day of surgery will not be allowed to drive home.    Special instructions:     [facility] - Preparing for Surgery  Before surgery, you can play an important role.  Because skin is not sterile, your skin needs to be as free of germs as possible.  You can reduce the number of germs on you skin by washing with CHG (chlorahexidine gluconate) soap before surgery.  CHG is an antiseptic cleaner which kills germs and bonds with the skin to continue killing germs even after washing.  Oral Hygiene is also important in reducing the risk of infection.  Remember to brush your teeth with your regular toothpaste the morning of surgery.  Please DO NOT use if you have an allergy to CHG or antibacterial soaps.  If your skin becomes reddened/irritated stop using the CHG and inform your nurse when you arrive at Short Stay.  Do not shave (including legs and underarms) for at least 48 hours prior to the first CHG shower.  You may shave your face.  Please follow these instructions carefully:   1.  Shower with CHG Soap the night before surgery and the morning of Surgery.  2.  If you choose to wash your hair, wash your hair first as usual with your normal shampoo.  3.  After you shampoo, rinse your hair and body thoroughly to remove the shampoo. 4.  Use CHG as you would any other liquid soap.  You can apply chg directly to the skin and wash gently with a      scrungie or washcloth.           5.  Apply the CHG Soap to your body ONLY FROM THE NECK DOWN.   Do not use on open wounds or open sores. Avoid contact with your eyes, ears, mouth and genitals  (private parts).  Wash genitals (private parts) with your normal soap.  6.  Wash thoroughly, paying special attention to the area where your surgery will be performed.  7.  Thoroughly rinse your body with warm water from the neck down.  8.  DO NOT shower/wash with your normal soap after using and rinsing off the CHG Soap.  9.  Pat yourself dry with a clean towel.  10.  Wear clean pajamas.            11.  Place clean sheets on your bed the night of your first shower and do not sleep with pets.  Day of Surgery  Do not apply any lotions/deoderants the morning of surgery.   Please wear clean clothes to the hospital/surgery center. Remember to brush your teeth with toothpaste.    Please read over the following fact sheets that you were given. Pain Booklet and Surgical Site Infection Prevention

## 2018-07-30 ENCOUNTER — Ambulatory Visit (HOSPITAL_BASED_OUTPATIENT_CLINIC_OR_DEPARTMENT_OTHER)
Admission: RE | Admit: 2018-07-30 | Discharge: 2018-07-30 | Disposition: A | Discharge status: Home / Self Care | Payer: Medicare Other | Source: Ambulatory Visit | Attending: Hematology and Oncology | Admitting: Hematology and Oncology

## 2018-07-30 ENCOUNTER — Other Ambulatory Visit: Payer: Self-pay

## 2018-07-30 ENCOUNTER — Encounter: Payer: Self-pay | Admitting: Radiation Oncology

## 2018-07-30 ENCOUNTER — Encounter (HOSPITAL_COMMUNITY)
Admission: RE | Admit: 2018-07-30 | Discharge: 2018-07-30 | Disposition: A | Discharge status: Home / Self Care | Payer: Medicare Other | Source: Ambulatory Visit | Attending: Surgery | Admitting: Surgery

## 2018-07-30 ENCOUNTER — Other Ambulatory Visit: Payer: Self-pay | Admitting: Hematology and Oncology

## 2018-07-30 ENCOUNTER — Encounter (HOSPITAL_COMMUNITY): Payer: Self-pay

## 2018-07-30 ENCOUNTER — Ambulatory Visit
Admission: RE | Admit: 2018-07-30 | Discharge: 2018-07-30 | Disposition: A | Discharge status: Home / Self Care | Payer: Medicare Other | Source: Ambulatory Visit | Attending: Radiation Oncology | Admitting: Radiation Oncology

## 2018-07-30 ENCOUNTER — Inpatient Hospital Stay: Payer: Medicare Other

## 2018-07-30 VITALS — BP 165/77 | HR 101 | Temp 98.1°F | Ht 66.0 in | Wt 287.2 lb

## 2018-07-30 DIAGNOSIS — Z170 Estrogen receptor positive status [ER+]: Secondary | ICD-10-CM

## 2018-07-30 DIAGNOSIS — E119 Type 2 diabetes mellitus without complications: Secondary | ICD-10-CM | POA: Insufficient documentation

## 2018-07-30 DIAGNOSIS — I10 Essential (primary) hypertension: Secondary | ICD-10-CM

## 2018-07-30 DIAGNOSIS — I4891 Unspecified atrial fibrillation: Secondary | ICD-10-CM | POA: Insufficient documentation

## 2018-07-30 DIAGNOSIS — C50412 Malignant neoplasm of upper-outer quadrant of left female breast: Secondary | ICD-10-CM

## 2018-07-30 DIAGNOSIS — Z5111 Encounter for antineoplastic chemotherapy: Secondary | ICD-10-CM | POA: Diagnosis not present

## 2018-07-30 DIAGNOSIS — Z794 Long term (current) use of insulin: Secondary | ICD-10-CM

## 2018-07-30 DIAGNOSIS — Z5112 Encounter for antineoplastic immunotherapy: Secondary | ICD-10-CM | POA: Diagnosis not present

## 2018-07-30 DIAGNOSIS — E785 Hyperlipidemia, unspecified: Secondary | ICD-10-CM | POA: Insufficient documentation

## 2018-07-30 DIAGNOSIS — D0512 Intraductal carcinoma in situ of left breast: Secondary | ICD-10-CM | POA: Diagnosis not present

## 2018-07-30 DIAGNOSIS — E1165 Type 2 diabetes mellitus with hyperglycemia: Secondary | ICD-10-CM

## 2018-07-30 DIAGNOSIS — N183 Chronic kidney disease, stage 3 (moderate): Secondary | ICD-10-CM | POA: Diagnosis not present

## 2018-07-30 HISTORY — DX: Malignant (primary) neoplasm, unspecified: C80.1

## 2018-07-30 HISTORY — DX: Cardiac murmur, unspecified: R01.1

## 2018-07-30 HISTORY — DX: Anemia, unspecified: D64.9

## 2018-07-30 HISTORY — DX: Bronchitis, not specified as acute or chronic: J40

## 2018-07-30 HISTORY — DX: Cardiac arrhythmia, unspecified: I49.9

## 2018-07-30 LAB — CBC WITH DIFFERENTIAL (CANCER CENTER ONLY)
Abs Immature Granulocytes: 0.05 10*3/uL (ref 0.00–0.07)
Basophils Absolute: 0.1 10*3/uL (ref 0.0–0.1)
Basophils Relative: 1 %
Eosinophils Absolute: 0.3 10*3/uL (ref 0.0–0.5)
Eosinophils Relative: 2 %
HCT: 37.5 % (ref 36.0–46.0)
Hemoglobin: 11.6 g/dL — ABNORMAL LOW (ref 12.0–15.0)
Immature Granulocytes: 0 %
LYMPHS ABS: 1.5 10*3/uL (ref 0.7–4.0)
Lymphocytes Relative: 11 %
MCH: 24.9 pg — ABNORMAL LOW (ref 26.0–34.0)
MCHC: 30.9 g/dL (ref 30.0–36.0)
MCV: 80.6 fL (ref 80.0–100.0)
Monocytes Absolute: 0.7 10*3/uL (ref 0.1–1.0)
Monocytes Relative: 5 %
Neutro Abs: 10.3 10*3/uL — ABNORMAL HIGH (ref 1.7–7.7)
Neutrophils Relative %: 81 %
Platelet Count: 270 10*3/uL (ref 150–400)
RBC: 4.65 MIL/uL (ref 3.87–5.11)
RDW: 14.4 % (ref 11.5–15.5)
WBC Count: 12.8 10*3/uL — ABNORMAL HIGH (ref 4.0–10.5)
nRBC: 0 % (ref 0.0–0.2)

## 2018-07-30 LAB — CMP (CANCER CENTER ONLY)
ALT: 17 U/L (ref 0–44)
ANION GAP: 13 (ref 5–15)
AST: 19 U/L (ref 15–41)
Albumin: 3.6 g/dL (ref 3.5–5.0)
Alkaline Phosphatase: 98 U/L (ref 38–126)
BUN: 27 mg/dL — ABNORMAL HIGH (ref 8–23)
CO2: 23 mmol/L (ref 22–32)
Calcium: 9.9 mg/dL (ref 8.9–10.3)
Chloride: 102 mmol/L (ref 98–111)
Creatinine: 0.82 mg/dL (ref 0.44–1.00)
GFR, Est AFR Am: >60 mL/min (ref >60)
GFR, Estimated: >60 mL/min (ref >60)
Glucose, Bld: 59 mg/dL — ABNORMAL LOW (ref 70–99)
Potassium: 4.4 mmol/L (ref 3.5–5.1)
Sodium: 138 mmol/L (ref 135–145)
Total Bilirubin: 0.4 mg/dL (ref 0.3–1.2)
Total Protein: 7.5 g/dL (ref 6.5–8.1)

## 2018-07-30 LAB — ECHOCARDIOGRAM COMPLETE
Height: 66 in
Weight: 4595.2 oz

## 2018-07-30 LAB — GLUCOSE, CAPILLARY
Glucose-Capillary: 45 mg/dL — ABNORMAL LOW (ref 70–99)
Glucose-Capillary: 77 mg/dL (ref 70–99)

## 2018-07-30 MED ORDER — DIPHENOXYLATE-ATROPINE 2.5-0.025 MG PO TABS: 1.0000 | ORAL_TABLET | Freq: Four times a day (QID) | ORAL | 3 refills | Status: DC | PRN

## 2018-07-30 NOTE — Progress Notes (Signed)
Call to Dr. Earney Mallet , requested that the HgbA1C done on 07/11/2018 in their office be faxed to [facility] PAT.

## 2018-07-30 NOTE — Progress Notes (Signed)
  Echocardiogram 2D Echocardiogram has been performed.  Destiny Nash Destiny Nash 07/30/2018, 10:30 AM

## 2018-07-30 NOTE — Progress Notes (Addendum)
Radiation Oncology         [phone]) [phone] ________________________________  Initial outpatient Consultation  Name: Destiny Nash MRN: [id number]  Date: 07/30/2018  DOB: June 30, 1945  [id number], Gwyndolyn Saxon, MD  Erroll Luna, MD   REFERRING PHYSICIAN: Erroll Luna, MD  DIAGNOSIS:    ICD-10-CM   1. 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 female who presented with breast abnormality on the following imaging: routine screening mammogram on the date of 06/12/2018.  Symptoms, if any, at that time, were: none.   Ultrasound of breast on 07/02/2018 revealed 2.7 cm mass in the 1 o'clock position of the left breast with imaging features highly suspicious for malignancy.   Biopsy on date of 07/09/2018 showed invasive ductal carcinoma and ductal carcinoma in situ (high nuclear grade).  ER status: 100%, strong; PR status 70%, moderate, Her2 status positive; Grade 2.  She underwent bilateral breast MRI on 07/18/2018, with results showing biopsy-proven malignancy in the left breast at 1 o'clock anterior depth measuring 2.6 x 1.9 x 2.4 cm, no axillary lymphadenopathy or other findings to suggest additional sites of disease in the left breast, and no evidence of malignancy in the right breast.  She met with Dr. Lindi Adie on 07/25/2018. Based on multidisciplinary tumor board discussion, her treatment recommendation is neoadjuvant chemotherapy followed by breast conserving surgery, then adjuvant radiation therapy, and finally adjuvant antiestrogen therapy.  On review of systems, she reports residual discomfort and pain from the breast MRI. She denies urinary issues, headaches, and any other  symptoms.  She is accompanied by her daughter today.  She is recovering from bilateral lower extremity cellulitis.  She has baseline atrial fibrillation.  She has diabetes.  Her husband recently passed away.  She likes to crochet.  She reports that she could be more physically active   PREVIOUS RADIATION THERAPY: No  PAST MEDICAL HISTORY:  has a past medical history of Atrial fibrillation ([place]), Cellulitis, Diabetes mellitus without complication ([place]), Hyperlipidemia, and Hypertension.    PAST SURGICAL HISTORY: Past Surgical History:  Procedure Laterality Date  . DILATION AND CURETTAGE OF UTERUS    . TONSILLECTOMY AND ADENOIDECTOMY      FAMILY HISTORY: family history includes CAD in her brother; Hyperlipidemia in her mother; Hypertension in her brother and mother.  SOCIAL HISTORY:  reports that she has never smoked. She has never used smokeless tobacco. She reports previous alcohol use. She reports that she does not use drugs.  ALLERGIES: Codeine and Levaquin [levofloxacin]  MEDICATIONS:  Current Outpatient Medications  Medication Sig Dispense Refill  . acetaminophen (TYLENOL) 500 MG tablet Take 1,000 mg by mouth every 6 (six) hours as needed for moderate pain or headache.    . albuterol (PROVENTIL HFA;VENTOLIN HFA) 108 (90 Base) MCG/ACT inhaler Inhale 2 puffs into the lungs every 6 (six) hours as needed for wheezing or shortness of breath.    Marland Kitchen apixaban (ELIQUIS) 5 MG TABS tablet Take 1 tablet (5 mg total) by mouth 2 (two) times daily. 60 tablet 0  . aspirin EC 81 MG tablet Take 81 mg by mouth at bedtime.    Marland Kitchen CINNAMON PO Take 1 tablet by mouth daily.    Marland Kitchen  diltiazem (CARDIZEM CD) 300 MG 24 hr capsule Take 1 capsule (300 mg total) by mouth daily. 90 capsule 3  . glyBURIDE (DIABETA) 5 MG tablet Take 10 mg by mouth 2 (two) times daily.  0  . insulin detemir (LEVEMIR) 100 UNIT/ML injection Inject 0.25 mLs (25 Units total) into the skin at bedtime. (Patient taking differently: Inject 14  Units into the skin at bedtime. ) 10 mL 0  . lidocaine-prilocaine (EMLA) cream Apply to affected area once 30 g 3  . lisinopril-hydrochlorothiazide (PRINZIDE,ZESTORETIC) 20-12.5 MG tablet Take 1/2 tablet (10-6.25 mg) once daily. 45 tablet 3  . metFORMIN (GLUCOPHAGE) 500 MG tablet Take 1,000 mg by mouth 2 (two) times daily with a meal.     . niacin 500 MG tablet Take 500 mg by mouth daily.    . Omega-3 1000 MG CAPS Take 1,000 mg by mouth 2 (two) times daily.    . pravastatin (PRAVACHOL) 20 MG tablet Take 20 mg by mouth daily.   0  . LORazepam (ATIVAN) 0.5 MG tablet Take 1 tablet (0.5 mg total) by mouth at bedtime as needed for sleep. (Patient not taking: Reported on 07/26/2018) 30 tablet 0  . ondansetron (ZOFRAN) 8 MG tablet Take 1 tablet (8 mg total) by mouth 2 (two) times daily as needed (Nausea or vomiting). (Patient not taking: Reported on 07/30/2018) 30 tablet 1  . prochlorperazine (COMPAZINE) 10 MG tablet Take 1 tablet (10 mg total) by mouth every 6 (six) hours as needed (Nausea or vomiting). (Patient not taking: Reported on 07/30/2018) 30 tablet 1   No current facility-administered medications for this encounter.     REVIEW OF SYSTEMS: A 10+ POINT REVIEW OF SYSTEMS WAS OBTAINED including neurology, dermatology, psychiatry, cardiac, respiratory, lymph, extremities, GI, GU, Musculoskeletal, constitutional, breasts, reproductive, HEENT.  All pertinent positives are noted in the HPI.  All others are negative.    PHYSICAL EXAM:  height is 5' 6"  (1.676 m) and weight is 287 lb 3.2 oz (130.3 kg). Her oral temperature is 98.1 F (36.7 C). Her blood pressure is 165/77 (abnormal) and her pulse is 101 (abnormal). Her oxygen saturation is 100%.   General: Alert and oriented, in no acute distress HEENT: Head is normocephalic. Extraocular movements are intact. Oropharynx is clear. Neck: Neck is supple, no palpable cervical or supraclavicular lymphadenopathy. Heart: Irregularly irregular rhythm, Regular in  rate , with a systolic murmur. Chest: Clear to auscultation bilaterally, with no rhonchi, wheezes, or rales. Abdomen: Soft, nontender, nondistended, with no rigidity or guarding. Extremities: Bilateral lower extremity pitting edema  lymphatics: see Neck Exam Skin: Erythematous skin in the lower extremities, more so on the right side, consistent with prior cellulitis.  She has ecchymoses on her forearm (right) Musculoskeletal: symmetric strength and muscle tone throughout. Neurologic: Cranial nerves II through XII are grossly intact. No obvious focalities. Speech is fluent. Coordination is intact. Psychiatric: Judgment and insight are intact. Affect is appropriate. Breasts: Left breast in the 1 o'clock position, there was a 4 cm mass. No other palpable masses appreciated in the breasts or axillae bilaterally.   ECOG = 1  0 - Asymptomatic (Fully active, able to carry on all predisease activities without restriction)  1 - Symptomatic but completely ambulatory (Restricted in physically strenuous activity but ambulatory and able to carry out work of a light or sedentary nature. For example, light housework, office work)  2 - Symptomatic, <50% in bed during the day (Ambulatory and capable of all self care but unable to carry out  any work activities. Up and about more than 50% of waking hours)  3 - Symptomatic, >50% in bed, but not bedbound (Capable of only limited self-care, confined to bed or chair 50% or more of waking hours)  4 - Bedbound (Completely disabled. Cannot carry on any self-care. Totally confined to bed or chair)  5 - Death   Eustace Pen MM, Creech RH, Tormey DC, et al. [phone]). "Toxicity and response criteria of the [facility]". [employer] Oncol. 5 (6): 649-55   LABORATORY DATA:  Lab Results  Component Value Date   WBC 16.3 (H) 02/15/2018   HGB 10.5 (L) 02/15/2018   HCT 32.1 (L) 02/15/2018   MCV 78.1 02/15/2018   PLT 254 02/15/2018   CMP       Component Value Date/Time   NA 136 02/15/2018 0500   K 3.4 (L) 02/15/2018 0500   CL 102 02/15/2018 0500   CO2 25 02/15/2018 0500   GLUCOSE 204 (H) 02/15/2018 0500   BUN 26 (H) 02/15/2018 0500   CREATININE 0.74 02/15/2018 0500   CALCIUM 8.4 (L) 02/15/2018 0500   PROT 6.6 02/13/2018 0650   ALBUMIN 2.6 (L) 02/13/2018 0650   AST 18 02/13/2018 0650   ALT 19 02/13/2018 0650   ALKPHOS 142 (H) 02/13/2018 0650   BILITOT 0.7 02/13/2018 0650   GFRNONAA >60 02/15/2018 0500   GFRAA >60 02/15/2018 0500         RADIOGRAPHY: Mr Breast Bilateral W Wo Contrast Inc Cad  Result Date: 07/18/2018 CLINICAL DATA:  73 year old female with recently diagnosed invasive ductal carcinoma/ductal carcinoma in situ of the left breast post ultrasound-guided biopsy of a 2.7 cm mass at the 1 o'clock periareolar position on 07/09/2018. LABS:  Creatinine was obtained on site at [facility] at [address] Results: Creatinine 0.8 mg/dL. EXAM: BILATERAL BREAST MRI WITH AND WITHOUT CONTRAST TECHNIQUE: Multiplanar, multisequence MR images of both breasts were obtained prior to and following the intravenous administration of 10 ml of Gadavist Three-dimensional MR images were rendered by post-processing of the original MR data on an independent workstation. The three-dimensional MR images were interpreted, and findings are reported in the following complete MRI report for this study. Three dimensional images were evaluated at the independent DynaCad workstation COMPARISON:  Previous exam(s). FINDINGS: Breast composition: b.  Scattered fibroglandular tissue. Background parenchymal enhancement: Minimal. Right breast: No suspicious rapidly enhancing masses or abnormal areas of enhancement in the right breast to suggest malignancy. Left breast: Irregular enhancing mass with central necrosis in the left breast at 1 o'clock anterior depth measures 2.6 cm AP, 1.9 cm transverse and 2.4 cm craniocaudal. The biopsy marking clip  is located centrally within this mass. Biopsy related changes are located medial to the mass. No additional enhancing masses or abnormal areas of enhancement identified in the left breast to suggest additional sites of disease. Lymph nodes: No abnormal appearing lymph nodes. Ancillary findings:  None. IMPRESSION: 1. Biopsy proven malignancy in the left breast at 1 o'clock anterior depth measures 2.6 x 1.9 x 2.4 cm. No axillary lymphadenopathy or other findings to suggest additional sites of disease in the left breast. 2.  No MRI evidence of malignancy in the right breast. RECOMMENDATION: Treatment plan for known left breast malignancy. BI-RADS CATEGORY  6: Known biopsy-proven malignancy. Electronically Signed   By: Everlean Alstrom M.D.   On: 07/18/2018 12:24   US Breast Ltd Uni Left Inc Axilla  Result Date: 07/02/2018 CLINICAL DATA:  Spiculated mass in the  upper-outer left breast on a recent screening mammogram. EXAM: DIGITAL DIAGNOSTIC LEFT MAMMOGRAM WITH TOMO ULTRASOUND LEFT BREAST COMPARISON:  Previous exam(s). ACR Breast Density Category b: There are scattered areas of fibroglandular density. FINDINGS: 3D tomographic and 2D generated spot compression views of the left breast confirm an irregular, spiculated mass in the anterior aspect of the upper-outer quadrant of the breast. On physical exam, there is an approximately 2.5 cm rounded, firm, palpable mass in the 1 o'clock position of the left breast, 4 cm from the nipple. There are no palpable left axillary lymph nodes. Targeted ultrasound is performed, showing a 2.7 x 2.4 x 1.9 cm irregular, hypoechoic mass in the 1 o'clock position of the left breast, 4 cm from the nipple. This has surrounding ill-defined echogenic tissue and has internal blood flow with color Doppler. Ultrasound of the left axilla demonstrated normal appearing left axillary lymph nodes. IMPRESSION: 2.7 cm mass in the 1 o'clock position of the left breast with imaging features highly  suspicious for malignancy. RECOMMENDATION: Ultrasound-guided core needle biopsy of the 2.7 cm mass in the 1 o'clock position of the left breast. This has been discussed the patient and scheduled at 3 p.m. on 07/09/2018. I have discussed the findings and recommendations with the patient. Results were also provided in writing at the conclusion of the visit. If applicable, a reminder letter will be sent to the patient regarding the next appointment. BI-RADS CATEGORY  5: Highly suggestive of malignancy. Electronically Signed   By: Claudie Revering M.D.   On: 07/02/2018 11:50   Mm Diag Breast Tomo Uni Left  Result Date: 07/02/2018 CLINICAL DATA:  Spiculated mass in the upper-outer left breast on a recent screening mammogram. EXAM: DIGITAL DIAGNOSTIC LEFT MAMMOGRAM WITH TOMO ULTRASOUND LEFT BREAST COMPARISON:  Previous exam(s). ACR Breast Density Category b: There are scattered areas of fibroglandular density. FINDINGS: 3D tomographic and 2D generated spot compression views of the left breast confirm an irregular, spiculated mass in the anterior aspect of the upper-outer quadrant of the breast. On physical exam, there is an approximately 2.5 cm rounded, firm, palpable mass in the 1 o'clock position of the left breast, 4 cm from the nipple. There are no palpable left axillary lymph nodes. Targeted ultrasound is performed, showing a 2.7 x 2.4 x 1.9 cm irregular, hypoechoic mass in the 1 o'clock position of the left breast, 4 cm from the nipple. This has surrounding ill-defined echogenic tissue and has internal blood flow with color Doppler. Ultrasound of the left axilla demonstrated normal appearing left axillary lymph nodes. IMPRESSION: 2.7 cm mass in the 1 o'clock position of the left breast with imaging features highly suspicious for malignancy. RECOMMENDATION: Ultrasound-guided core needle biopsy of the 2.7 cm mass in the 1 o'clock position of the left breast. This has been discussed the patient and scheduled at 3 p.m.  on 07/09/2018. I have discussed the findings and recommendations with the patient. Results were also provided in writing at the conclusion of the visit. If applicable, a reminder letter will be sent to the patient regarding the next appointment. BI-RADS CATEGORY  5: Highly suggestive of malignancy. Electronically Signed   By: Claudie Revering M.D.   On: 07/02/2018 11:50   Mm Clip Placement Left  Result Date: 07/09/2018 CLINICAL DATA:  Post ultrasound-guided biopsy of a mass at the 1 o'clock position. EXAM: DIAGNOSTIC LEFT MAMMOGRAM POST ULTRASOUND BIOPSY COMPARISON:  Previous exam(s). FINDINGS: Mammographic images were obtained following ultrasound guided biopsy of a mass in the left breast  at the 1 o'clock position. A ribbon shaped biopsy marking clip is present at the site of the biopsied mass in the left breast at the 1 o'clock position. IMPRESSION: Ribbon shaped biopsy marking clip at site of biopsied mass in the left breast at the 1 o'clock position. Final Assessment: Post Procedure Mammograms for Marker Placement Electronically Signed   By: Everlean Alstrom M.D.   On: 07/09/2018 08:47   [place] Lt Breast Bx W Loc Dev 1st Lesion Img Bx Spec US Guide  Addendum Date: 07/10/2018   ADDENDUM REPORT: 07/10/2018 16:45 ADDENDUM: Pathology results: Pathology results from the ultrasound-guided biopsy of the mass in the left breast at the 1 o'clock position revealed invasive ductal carcinoma and ductal carcinoma in situ. This is concordant with the imaging findings. The patient has been notified of the results. She does not report any biopsy site complications. Mrs. Cromie has an appointment to see her primary care physician, Dr. Kenton Kingfisher, tomorrow. She plans on discussing the biopsy results with him at that time. The breast navigator at [facility] has been notified of the patient's biopsy results and will assist with coordinating a referral to a surgeon. The patient has been instructed to call the [place] with any  questions or concerns. Electronically Signed   By: Everlean Alstrom M.D.   On: 07/10/2018 16:45   Result Date: 07/10/2018 CLINICAL DATA:  73 year old female with a suspicious mass in the left breast at the 1 o'clock position. EXAM: ULTRASOUND GUIDED LEFT BREAST CORE NEEDLE BIOPSY COMPARISON:  Previous exam(s). FINDINGS: I met with the patient and we discussed the procedure of ultrasound-guided biopsy, including benefits and alternatives. We discussed the high likelihood of a successful procedure. We discussed the risks of the procedure, including infection, bleeding, tissue injury, clip migration, and inadequate sampling. Informed written consent was given. The usual time-out protocol was performed immediately prior to the procedure. Lesion quadrant: Upper-outer Using sterile technique and 1% Lidocaine as local anesthetic, under direct ultrasound visualization, a 14 gauge spring-loaded device was used to perform biopsy of the mass in the left breast at the 1 o'clock position using a medial to lateral approach. At the conclusion of the procedure a ribbon shaped tissue marker clip was deployed into the biopsy cavity. Follow up 2 view mammogram was performed and dictated separately. IMPRESSION: Ultrasound guided biopsy of the mass in the left breast at the 1 o'clock position. No apparent complications. Electronically Signed: By: Everlean Alstrom M.D. On: 07/09/2018 08:30      IMPRESSION/PLAN: Left Breast Cancer   She has significant distress since her diagnosis and recent passing of her husband. We will refer her to social work.  She plans on neoadjuvant chemotherapy and breast conserving surgery if possible. We discussed that radiation therapy would be necessary to have a good prognosis if she undergoes breast conservation.  We discussed the risks, benefits, and side effects of radiotherapy. I recommend radiotherapy to the left breast to reduce her risk of locoregional recurrence by 2/3.  We briefly  discussed the role of neoadjuvant chemotherapy prior to surgery. We discussed that radiation would take approximately 4 weeks to complete and that I would give the patient a few weeks to heal following surgery before starting treatment planning. We spoke about acute effects including skin irritation and fatigue as well as much less common late effects including internal organ injury or irritation. We spoke about the latest technology that is used to minimize the risk of late effects for patients undergoing radiotherapy  to the breast or chest wall. No guarantees of treatment were given. The patient is enthusiastic about proceeding with treatment. I look forward to participating in the patient's care.  I will await her referral back to me for postoperative follow-up and eventual CT simulation/treatment planning.  She understands it's less likely she will need radiation if she undergoes mastectomy.     __________________________________________   Eppie Gibson, MD   This document serves as a record of services personally performed by Eppie Gibson, MD. It was created on her behalf by Wilburn Mylar, a trained medical scribe. The creation of this record is based on the scribe's personal observations and the provider's statements to them. This document has been checked and approved by the attending provider.

## 2018-07-30 NOTE — Progress Notes (Addendum)
PCP - w. hARRIS Cardiologist - Branch  Chest x-ray -  EKG - 07/01/2018 Stress Test -  ECHO - 07/30/2018 Cardiac Cath -   Sleep Study - in the future CPAP - n/a   Fasting Blood Sugar -45  Checks Blood Sugar ____0_ times a day  Blood Thinner Instructions:last dose 2/24 Aspirin Instructions:last dose 2/24  Anesthesia review: called Jeneen Rinks, consulted with CBG- 45 ( pt. Reports that she feels ok, a little lightheaded since having the ECHO today, no other hypoglycemic S&S)  , also asked for CXR desire due to Dr. Nelly Laurence note relative to him remarking about PFT's . Decision made not to do CXR, chart will be forwarded to anesth. For review. After consuming a coke & some crackers pt. cbg- 77. Pt. Will proceed home now & plans to eat when she arrives. She has a candy for her use on the ride home. Discussed with pt. The need for her to check her bld. Sugar several times a day. She has a Architectural technologist but admits that she sometimes goes a week without checking it.   Patient denies shortness of breath, fever, cough and chest pain at PAT appointment   Patient verbalized understanding of instructions that were given to them at the PAT appointment. Patient was also instructed that they will need to review over the PAT instructions again at home before surgery.

## 2018-07-30 NOTE — Addendum Note (Signed)
Encounter addended by: Eppie Gibson, MD on: 07/30/2018 8:55 AM  Actions taken: Actions taken from a BestPractice Advisory, Visit Navigator Flowsheet section accepted, Diagnosis association updated, Order list changed

## 2018-07-30 NOTE — Addendum Note (Signed)
Encounter addended by: Eppie Gibson, MD on: 07/30/2018 11:09 AM  Actions taken: Clinical Note Signed

## 2018-07-31 ENCOUNTER — Inpatient Hospital Stay: Payer: Medicare Other

## 2018-07-31 ENCOUNTER — Encounter: Payer: Self-pay | Admitting: General Practice

## 2018-07-31 MED ORDER — DEXTROSE 5 % IV SOLN
3.0000 g | INTRAVENOUS | Status: AC
  Administered 2018-08-01: 3 g via INTRAVENOUS
  Filled 2018-07-31: qty 3

## 2018-07-31 NOTE — Anesthesia Preprocedure Evaluation (Addendum)
Anesthesia Evaluation  Patient identified by MRN, date of birth, ID band Patient awake    Reviewed: Allergy & Precautions, NPO status , Patient's Chart, lab work & pertinent test results  Airway Mallampati: II  TM Distance: >3 FB Neck ROM: Full    Dental no notable dental hx.    Pulmonary neg pulmonary ROS,    Pulmonary exam normal breath sounds clear to auscultation + decreased breath sounds      Cardiovascular hypertension, + dysrhythmias Atrial Fibrillation  Rhythm:Irregular Rate:Normal     Neuro/Psych negative neurological ROS  negative psych ROS   GI/Hepatic negative GI ROS, Neg liver ROS,   Endo/Other  diabetes, Insulin DependentMorbid obesity  Renal/GU negative Renal ROS  negative genitourinary   Musculoskeletal negative musculoskeletal ROS (+)   Abdominal (+) + obese,   Peds negative pediatric ROS (+)  Hematology negative hematology ROS (+)   Anesthesia Other Findings   Reproductive/Obstetrics negative OB ROS                            Anesthesia Physical Anesthesia Plan  ASA: III  Anesthesia Plan: General   Post-op Pain Management:    Induction: Intravenous  PONV Risk Score and Plan: 3 and Ondansetron and Treatment may vary due to age or medical condition  Airway Management Planned: LMA  Additional Equipment:   Intra-op Plan:   Post-operative Plan: Extubation in OR  Informed Consent: I have reviewed the patients History and Physical, chart, labs and discussed the procedure including the risks, benefits and alternatives for the proposed anesthesia with the patient or authorized representative who has indicated his/her understanding and acceptance.     Dental advisory given  Plan Discussed with: CRNA and Surgeon  Anesthesia Plan Comments: (Follows with Dr. Harl Bowie for new diagnosis of Afib during 02/2018 admission for cellulitis. TTE 07/30/18 showed EF 60-65%, normal  valves. Previous TTE 02/13/18 showed PA peak pressure: 65 mm Hg (S), however the most recent echo did not mention increased PA pressure.   Pt also had episode of hypoglycemia at PAT. Given crackers and soda, improved, instructed to monitor more closely as she does not check frequently. Last A1c per PCP 7.7 on 07/12/18.  )       Anesthesia Quick Evaluation

## 2018-07-31 NOTE — Progress Notes (Signed)
[place] Psychosocial Distress Screening Clinical Social Work  Clinical Social Work was referred by distress screening protocol.  The patient scored a 6 on the Psychosocial Distress Thermometer which indicates moderate distress. Clinical Social Worker contacted patient by phone to assess for distress and other psychosocial needs. Briefly reviewed resources available in [place] at [facility] and [facility].  Patient caring for children, not a good time to talk, will mail information packet.  Invited patient to call back if needed for support/resources.    ONCBCN DISTRESS SCREENING 07/30/2018  Screening Type Initial Screening  Distress experienced in past week (1-10) 6  Family Problem type Partner  Emotional problem type Nervousness/Anxiety;Adjusting to illness  Physical Problem type Sleep/insomnia    Clinical Social Worker follow up needed: No.  If yes, follow up plan:  Beverely Pace, [place], LCSW Clinical Social Worker Phone:  [phone]

## 2018-08-01 ENCOUNTER — Ambulatory Visit (HOSPITAL_COMMUNITY): Payer: Medicare Other

## 2018-08-01 ENCOUNTER — Encounter (HOSPITAL_COMMUNITY): Payer: Self-pay

## 2018-08-01 ENCOUNTER — Other Ambulatory Visit: Payer: Self-pay

## 2018-08-01 ENCOUNTER — Ambulatory Visit (HOSPITAL_COMMUNITY): Payer: Medicare Other | Admitting: Anesthesiology

## 2018-08-01 ENCOUNTER — Ambulatory Visit (HOSPITAL_COMMUNITY)
Admission: RE | Admit: 2018-08-01 | Discharge: 2018-08-01 | Disposition: A | Discharge status: Home / Self Care | Payer: Medicare Other | Attending: Surgery | Admitting: Surgery

## 2018-08-01 ENCOUNTER — Encounter (HOSPITAL_COMMUNITY)
Admission: RE | Disposition: A | Discharge status: Home / Self Care | Payer: Self-pay | Source: Home / Self Care | Attending: Surgery

## 2018-08-01 ENCOUNTER — Ambulatory Visit (HOSPITAL_COMMUNITY): Payer: Medicare Other | Admitting: Physician Assistant

## 2018-08-01 DIAGNOSIS — Z419 Encounter for procedure for purposes other than remedying health state, unspecified: Secondary | ICD-10-CM

## 2018-08-01 DIAGNOSIS — I4891 Unspecified atrial fibrillation: Secondary | ICD-10-CM | POA: Insufficient documentation

## 2018-08-01 DIAGNOSIS — Z95828 Presence of other vascular implants and grafts: Secondary | ICD-10-CM

## 2018-08-01 DIAGNOSIS — Z452 Encounter for adjustment and management of vascular access device: Secondary | ICD-10-CM | POA: Diagnosis not present

## 2018-08-01 DIAGNOSIS — Z79899 Other long term (current) drug therapy: Secondary | ICD-10-CM | POA: Insufficient documentation

## 2018-08-01 DIAGNOSIS — I10 Essential (primary) hypertension: Secondary | ICD-10-CM | POA: Insufficient documentation

## 2018-08-01 DIAGNOSIS — E7800 Pure hypercholesterolemia, unspecified: Secondary | ICD-10-CM | POA: Diagnosis not present

## 2018-08-01 DIAGNOSIS — E119 Type 2 diabetes mellitus without complications: Secondary | ICD-10-CM | POA: Insufficient documentation

## 2018-08-01 DIAGNOSIS — C50912 Malignant neoplasm of unspecified site of left female breast: Secondary | ICD-10-CM | POA: Diagnosis not present

## 2018-08-01 DIAGNOSIS — Z7984 Long term (current) use of oral hypoglycemic drugs: Secondary | ICD-10-CM | POA: Insufficient documentation

## 2018-08-01 DIAGNOSIS — Z6841 Body Mass Index (BMI) 40.0 and over, adult: Secondary | ICD-10-CM | POA: Insufficient documentation

## 2018-08-01 DIAGNOSIS — Z7901 Long term (current) use of anticoagulants: Secondary | ICD-10-CM | POA: Insufficient documentation

## 2018-08-01 DIAGNOSIS — C50412 Malignant neoplasm of upper-outer quadrant of left female breast: Secondary | ICD-10-CM | POA: Insufficient documentation

## 2018-08-01 HISTORY — PX: PORTACATH PLACEMENT: SHX2246

## 2018-08-01 LAB — PROTIME-INR
INR: 1.1 (ref 0.8–1.2)
PROTHROMBIN TIME: 14 s (ref 11.4–15.2)

## 2018-08-01 LAB — GLUCOSE, CAPILLARY
GLUCOSE-CAPILLARY: 120 mg/dL — AB (ref 70–99)
Glucose-Capillary: 108 mg/dL — ABNORMAL HIGH (ref 70–99)

## 2018-08-01 LAB — APTT: aPTT: 32 seconds (ref 24–36)

## 2018-08-01 SURGERY — INSERTION, TUNNELED CENTRAL VENOUS DEVICE, WITH PORT
Anesthesia: General | Site: Chest

## 2018-08-01 MED ORDER — 0.9 % SODIUM CHLORIDE (POUR BTL) OPTIME
TOPICAL | Status: DC | PRN
  Administered 2018-08-01: 1000 mL

## 2018-08-01 MED ORDER — LACTATED RINGERS IV SOLN
INTRAVENOUS | Status: DC
  Administered 2018-08-01: 10:00:00 via INTRAVENOUS

## 2018-08-01 MED ORDER — SODIUM CHLORIDE 0.9 % IV SOLN
INTRAVENOUS | Status: DC | PRN
  Administered 2018-08-01: 12:00:00

## 2018-08-01 MED ORDER — GABAPENTIN 300 MG PO CAPS
ORAL_CAPSULE | ORAL | Status: AC
  Administered 2018-08-01: 300 mg via ORAL
  Filled 2018-08-01: qty 1

## 2018-08-01 MED ORDER — FENTANYL CITRATE (PF) 250 MCG/5ML IJ SOLN
INTRAMUSCULAR | Status: AC
  Filled 2018-08-01: qty 5

## 2018-08-01 MED ORDER — ROCURONIUM BROMIDE 50 MG/5ML IV SOSY
PREFILLED_SYRINGE | INTRAVENOUS | Status: AC
  Filled 2018-08-01: qty 5

## 2018-08-01 MED ORDER — ACETAMINOPHEN 500 MG PO TABS
ORAL_TABLET | ORAL | Status: AC
  Administered 2018-08-01: 1000 mg via ORAL
  Filled 2018-08-01: qty 2

## 2018-08-01 MED ORDER — OXYCODONE HCL 5 MG PO TABS: 5.0000 mg | ORAL_TABLET | Freq: Four times a day (QID) | ORAL | 0 refills | Status: DC | PRN

## 2018-08-01 MED ORDER — ACETAMINOPHEN 500 MG PO TABS
1000.0000 mg | ORAL_TABLET | ORAL | Status: AC
  Administered 2018-08-01: 1000 mg via ORAL

## 2018-08-01 MED ORDER — PROPOFOL 10 MG/ML IV BOLUS
INTRAVENOUS | Status: AC
  Filled 2018-08-01: qty 20

## 2018-08-01 MED ORDER — PHENYLEPHRINE 40 MCG/ML (10ML) SYRINGE FOR IV PUSH (FOR BLOOD PRESSURE SUPPORT)
PREFILLED_SYRINGE | INTRAVENOUS | Status: AC
  Filled 2018-08-01: qty 10

## 2018-08-01 MED ORDER — DEXAMETHASONE SODIUM PHOSPHATE 10 MG/ML IJ SOLN
INTRAMUSCULAR | Status: AC
  Filled 2018-08-01: qty 2

## 2018-08-01 MED ORDER — CHLORHEXIDINE GLUCONATE CLOTH 2 % EX PADS: 6.0000 | MEDICATED_PAD | Freq: Once | CUTANEOUS | Status: DC

## 2018-08-01 MED ORDER — DEXAMETHASONE SODIUM PHOSPHATE 4 MG/ML IJ SOLN
INTRAMUSCULAR | Status: DC | PRN
  Administered 2018-08-01: 5 mg via INTRAVENOUS

## 2018-08-01 MED ORDER — GABAPENTIN 300 MG PO CAPS
300.0000 mg | ORAL_CAPSULE | ORAL | Status: AC
  Administered 2018-08-01: 300 mg via ORAL

## 2018-08-01 MED ORDER — DEXAMETHASONE SODIUM PHOSPHATE 10 MG/ML IJ SOLN
INTRAMUSCULAR | Status: AC
  Filled 2018-08-01: qty 1

## 2018-08-01 MED ORDER — KETOROLAC TROMETHAMINE 30 MG/ML IJ SOLN
INTRAMUSCULAR | Status: AC
  Filled 2018-08-01: qty 1

## 2018-08-01 MED ORDER — BUPIVACAINE HCL (PF) 0.25 % IJ SOLN
INTRAMUSCULAR | Status: AC
  Filled 2018-08-01: qty 30

## 2018-08-01 MED ORDER — PROMETHAZINE HCL 25 MG/ML IJ SOLN: 6.2500 mg | INTRAMUSCULAR | Status: DC | PRN

## 2018-08-01 MED ORDER — ONDANSETRON HCL 4 MG/2ML IJ SOLN
INTRAMUSCULAR | Status: AC
  Filled 2018-08-01: qty 2

## 2018-08-01 MED ORDER — FENTANYL CITRATE (PF) 100 MCG/2ML IJ SOLN: 25.0000 ug | INTRAMUSCULAR | Status: DC | PRN

## 2018-08-01 MED ORDER — ONDANSETRON HCL 4 MG/2ML IJ SOLN
INTRAMUSCULAR | Status: DC | PRN
  Administered 2018-08-01: 4 mg via INTRAVENOUS

## 2018-08-01 MED ORDER — HEPARIN SOD (PORK) LOCK FLUSH 100 UNIT/ML IV SOLN
INTRAVENOUS | Status: AC
  Filled 2018-08-01: qty 5

## 2018-08-01 MED ORDER — FENTANYL CITRATE (PF) 100 MCG/2ML IJ SOLN
INTRAMUSCULAR | Status: DC | PRN
  Administered 2018-08-01 (×2): 50 ug via INTRAVENOUS

## 2018-08-01 MED ORDER — LIDOCAINE 2% (20 MG/ML) 5 ML SYRINGE
INTRAMUSCULAR | Status: DC | PRN
  Administered 2018-08-01: 60 mg via INTRAVENOUS

## 2018-08-01 MED ORDER — CELECOXIB 200 MG PO CAPS
ORAL_CAPSULE | ORAL | Status: AC
  Administered 2018-08-01: 200 mg via ORAL
  Filled 2018-08-01: qty 1

## 2018-08-01 MED ORDER — CELECOXIB 200 MG PO CAPS
200.0000 mg | ORAL_CAPSULE | ORAL | Status: AC
  Administered 2018-08-01: 200 mg via ORAL

## 2018-08-01 MED ORDER — KETOROLAC TROMETHAMINE 30 MG/ML IJ SOLN
30.0000 mg | Freq: Once | INTRAMUSCULAR | Status: AC | PRN
  Administered 2018-08-01: 30 mg via INTRAVENOUS

## 2018-08-01 MED ORDER — PROPOFOL 10 MG/ML IV BOLUS
INTRAVENOUS | Status: DC | PRN
  Administered 2018-08-01: 200 mg via INTRAVENOUS

## 2018-08-01 MED ORDER — LIDOCAINE 2% (20 MG/ML) 5 ML SYRINGE
INTRAMUSCULAR | Status: AC
  Filled 2018-08-01: qty 5

## 2018-08-01 MED ORDER — HEPARIN SOD (PORK) LOCK FLUSH 100 UNIT/ML IV SOLN
INTRAVENOUS | Status: DC | PRN
  Administered 2018-08-01: 400 [IU] via INTRAVENOUS

## 2018-08-01 MED ORDER — BUPIVACAINE HCL 0.25 % IJ SOLN
INTRAMUSCULAR | Status: DC | PRN
  Administered 2018-08-01: 21 mL

## 2018-08-01 MED ORDER — SUCCINYLCHOLINE CHLORIDE 200 MG/10ML IV SOSY
PREFILLED_SYRINGE | INTRAVENOUS | Status: AC
  Filled 2018-08-01: qty 10

## 2018-08-01 MED ORDER — EPHEDRINE SULFATE-NACL 50-0.9 MG/10ML-% IV SOSY
PREFILLED_SYRINGE | INTRAVENOUS | Status: DC | PRN
  Administered 2018-08-01 (×2): 10 mg via INTRAVENOUS

## 2018-08-01 MED ORDER — EPHEDRINE 5 MG/ML INJ
INTRAVENOUS | Status: AC
  Filled 2018-08-01: qty 10

## 2018-08-01 MED ORDER — PHENYLEPHRINE 40 MCG/ML (10ML) SYRINGE FOR IV PUSH (FOR BLOOD PRESSURE SUPPORT)
PREFILLED_SYRINGE | INTRAVENOUS | Status: DC | PRN
  Administered 2018-08-01: 80 ug via INTRAVENOUS

## 2018-08-01 MED ORDER — SODIUM CHLORIDE 0.9 % IV SOLN
INTRAVENOUS | Status: AC
  Filled 2018-08-01: qty 1.2

## 2018-08-01 SURGICAL SUPPLY — 42 items
ADH SKN CLS APL DERMABOND .7 (GAUZE/BANDAGES/DRESSINGS) ×1
BAG DECANTER FOR FLEXI CONT (MISCELLANEOUS) ×3 IMPLANT
CHLORAPREP W/TINT 26ML (MISCELLANEOUS) ×3 IMPLANT
COVER SURGICAL LIGHT HANDLE (MISCELLANEOUS) ×3 IMPLANT
COVER TRANSDUCER ULTRASND GEL (DRAPE) ×3 IMPLANT
COVER WAND RF STERILE (DRAPES) ×1 IMPLANT
CRADLE DONUT ADULT HEAD (MISCELLANEOUS) ×3 IMPLANT
DERMABOND ADVANCED (GAUZE/BANDAGES/DRESSINGS) ×2
DERMABOND ADVANCED .7 DNX12 (GAUZE/BANDAGES/DRESSINGS) ×1 IMPLANT
DRAPE C-ARM 42X72 X-RAY (DRAPES) ×3 IMPLANT
DRSG TEGADERM 4X4.75 (GAUZE/BANDAGES/DRESSINGS) ×6 IMPLANT
ELECT CAUTERY BLADE 6.4 (BLADE) ×3 IMPLANT
ELECT REM PT RETURN 9FT ADLT (ELECTROSURGICAL) ×3
ELECTRODE REM PT RTRN 9FT ADLT (ELECTROSURGICAL) ×1 IMPLANT
GAUZE 4X4 16PLY RFD (DISPOSABLE) ×3 IMPLANT
GAUZE SPONGE 4X4 12PLY STRL LF (GAUZE/BANDAGES/DRESSINGS) ×3 IMPLANT
GEL ULTRASOUND 20GR AQUASONIC (MISCELLANEOUS) IMPLANT
GLOVE BIO SURGEON STRL SZ8 (GLOVE) ×3 IMPLANT
GLOVE BIOGEL PI IND STRL 8 (GLOVE) ×1 IMPLANT
GLOVE BIOGEL PI INDICATOR 8 (GLOVE) ×2
GOWN STRL REUS W/ TWL LRG LVL3 (GOWN DISPOSABLE) ×1 IMPLANT
GOWN STRL REUS W/ TWL XL LVL3 (GOWN DISPOSABLE) ×1 IMPLANT
GOWN STRL REUS W/TWL LRG LVL3 (GOWN DISPOSABLE) ×3
GOWN STRL REUS W/TWL XL LVL3 (GOWN DISPOSABLE) ×3
INTRODUCER COOK 11FR (CATHETERS) IMPLANT
KIT BASIN OR (CUSTOM PROCEDURE TRAY) ×3 IMPLANT
KIT PORT POWER 8FR ISP CVUE (Port) ×2 IMPLANT
KIT TURNOVER KIT B (KITS) ×3 IMPLANT
NS IRRIG 1000ML POUR BTL (IV SOLUTION) ×3 IMPLANT
PAD ARMBOARD 7.5X6 YLW CONV (MISCELLANEOUS) ×5 IMPLANT
PENCIL BUTTON HOLSTER BLD 10FT (ELECTRODE) ×3 IMPLANT
SET INTRODUCER 12FR PACEMAKER (INTRODUCER) IMPLANT
SET SHEATH INTRODUCER 10FR (MISCELLANEOUS) IMPLANT
SHEATH COOK PEEL AWAY SET 9F (SHEATH) IMPLANT
SUT MNCRL AB 4-0 PS2 18 (SUTURE) ×3 IMPLANT
SUT PROLENE 2 0 SH 30 (SUTURE) ×3 IMPLANT
SUT VIC AB 3-0 SH 27 (SUTURE) ×3
SUT VIC AB 3-0 SH 27X BRD (SUTURE) ×1 IMPLANT
SYR 5ML LUER SLIP (SYRINGE) ×3 IMPLANT
TOWEL OR 17X24 6PK STRL BLUE (TOWEL DISPOSABLE) ×3 IMPLANT
TOWEL OR 17X26 10 PK STRL BLUE (TOWEL DISPOSABLE) ×3 IMPLANT
TRAY LAPAROSCOPIC MC (CUSTOM PROCEDURE TRAY) ×3 IMPLANT

## 2018-08-01 NOTE — Discharge Instructions (Signed)
° ° °  PORT-A-CATH: POST OP INSTRUCTIONS  Always review your discharge instruction sheet given to you by the facility where your surgery was performed.   1. A prescription for pain medication may be given to you upon discharge. Take your pain medication as prescribed, if needed. If narcotic pain medicine is not needed, then you make take acetaminophen (Tylenol) or ibuprofen (Advil) as needed.  2. Take your usually prescribed medications unless otherwise directed. 3. If you need a refill on your pain medication, please contact our office. All narcotic pain medicine now requires a paper prescription.  Phoned in and fax refills are no longer allowed by law.  Prescriptions will not be filled after 5 pm or on weekends.  4. You should follow a light diet for the remainder of the day after your procedure. 5. Most patients will experience some mild swelling and/or bruising in the area of the incision. It may take several days to resolve. 6. It is common to experience some constipation if taking pain medication after surgery. Increasing fluid intake and taking a stool softener (such as Colace) will usually help or prevent this problem from occurring. A mild laxative (Milk of Magnesia or Miralax) should be taken according to package directions if there are no bowel movements after 48 hours.  7. Unless discharge instructions indicate otherwise, you may remove your bandages 48 hours after surgery, and you may shower at that time. You may have steri-strips (small white skin tapes) in place directly over the incision.  These strips should be left on the skin for 7-10 days.  If your surgeon used Dermabond (skin glue) on the incision, you may shower in 24 hours.  The glue will flake off over the next 2-3 weeks.  8. If your port is left accessed at the end of surgery (needle left in port), the dressing cannot get wet and should only by changed by a healthcare professional. When the port is no longer accessed (when the  needle has been removed), follow step 7.   9. ACTIVITIES:  Limit activity involving your arms for the next 72 hours. Do no strenuous exercise or activity for 1 week. You may drive when you are no longer taking prescription pain medication, you can comfortably wear a seatbelt, and you can maneuver your car. 10.You may need to see your doctor in the office for a follow-up appointment.  Please       check with your doctor.  11.When you receive a new Port-a-Cath, you will get a product guide and        ID card.  Please keep them in case you need them.  WHEN TO CALL YOUR DOCTOR [phone]): 1. Fever over 101.0 2. Chills 3. Continued bleeding from incision 4. Increased redness and tenderness at the site 5. Shortness of breath, difficulty breathing   The clinic staff is available to answer your questions during regular business hours. Please dont hesitate to call and ask to speak to one of the nurses or medical assistants for clinical concerns. If you have a medical emergency, go to the nearest emergency room or call 911.  A surgeon from [facility] is always on call at the hospital.     For further information, please visit [email or web address]     RESTART ELOQIS ON Sunday

## 2018-08-01 NOTE — Op Note (Signed)
preoperative diagnosis: PAC needed FOR CHEMOTHERAPY   Postoperative diagnosis: Same  Procedure: Portacath Placement with U/S and C arm guidance   Surgeon: Turner Daniels, MD, FACS  Anesthesia: General and 0.25 % marcaine with epinephrine  Clinical History and Indications: The patient is getting ready to begin chemotherapy for her cancer. She  needs a Port-A-Cath for venous access. Risk of bleeding, infection,  Collapse lung,  Death,  DVT,  Organ injury,  Mediastinal injury,  Injury to heart,  Injury to blood vessels,  Nerves,  Migration of catheter,  Embolization of catheter and the need for more surgery.  Description of Procedure: I have seen the patient in the holding area and confirmed the plans for the procedure as noted above. I reviewed the risks and complications again and the patient has no further questions. She wishes to proceed.   The patient was then taken to the operating room. After satisfactory general  anesthesia had been obtained the upper chest and lower neck were prepped and draped as a sterile field. The timeout was done.  The right internal jugular vein  was entered under U/S guidance  and the guidewire threaded into the superior vena cava right atrial area under fluoroscopic guidance. An incision was then made on the anterior chest wall and a subcutaneous pocket fashioned for the port reservoir.  The port tubing was then brought through a subcutaneous tunnel from the port site to the guidewire site.  The port and catheter were attached, locked  and flushed. The catheter was measured and cut to appropriate length.The dilator and peel-away sheath were then advanced over the guidewire while monitoring this with fluoroscopy. The guidewire and dilator were removed and the tubing threaded to approximately 21 cm. The peel-away sheath was then removed. The catheter aspirated and flushed easily. Using fluoroscopy the tip was in the superior vena cava right atrial junction area. It  aspirated and flushed easily. That aspirated and flushed easily.  The reservoir was secured to the fascia with 1 sutures of 2-0 Prolene. A final check with fluoroscopy was done to make sure we had no kinks and good positioning of the tip of the catheter. Everything appeared to be okay. The catheter was aspirated, flushed with dilute heparin and then concentrated aqueous heparin.  The incision was then closed with interrupted 3-0 Vicryl, and 4-0 Monocryl subcuticular with Dermabond on the skin.  The port was left accessed.   There were no operative complications. Estimated blood loss was minimal. All counts were correct. The patient tolerated the procedure well.  Turner Daniels, MD, FACS

## 2018-08-01 NOTE — Interval H&P Note (Signed)
History and Physical Interval Note:  08/01/2018 10:34 AM  Destiny Nash  has presented today for surgery, with the diagnosis of poor venous access  The various methods of treatment have been discussed with the patient and family. After consideration of risks, benefits and other options for treatment, the patient has consented to  Procedure(s): INSERTION PORT-A-CATH WITH ULTRASOUND (N/A) as a surgical intervention .  The patient's history has been reviewed, patient examined, no change in status, stable for surgery.  I have reviewed the patient's chart and labs.  Questions were answered to the patient's satisfaction.     [place]

## 2018-08-01 NOTE — Anesthesia Postprocedure Evaluation (Signed)
Anesthesia Post Note  Patient: Destiny Nash  Procedure(s) Performed: INSERTION PORT-A-CATH WITH ULTRASOUND (N/A Chest)     Patient location during evaluation: PACU Anesthesia Type: General Level of consciousness: awake and alert Pain management: pain level controlled Vital Signs Assessment: post-procedure vital signs reviewed and stable Respiratory status: spontaneous breathing, nonlabored ventilation, respiratory function stable and patient connected to nasal cannula oxygen Cardiovascular status: blood pressure returned to baseline and stable Postop Assessment: no apparent nausea or vomiting Anesthetic complications: no    Last Vitals:  Vitals:   08/01/18 1245 08/01/18 1300  BP: (!) 154/79 (!) 159/85  Pulse: 90 79  Resp: 20 16  Temp: (!) 36.3 C   SpO2: 100% 95%    Last Pain:  Vitals:   08/01/18 1245  TempSrc:   PainSc: 0-No pain                 Yulonda [place] S

## 2018-08-01 NOTE — Transfer of Care (Signed)
Immediate Anesthesia Transfer of Care Note  Patient: Destiny Nash  Procedure(s) Performed: INSERTION PORT-A-CATH WITH ULTRASOUND (N/A Chest)  Patient Location: PACU  Anesthesia Type:General  Level of Consciousness: awake  Airway & Oxygen Therapy: Patient Spontanous Breathing and Patient connected to face mask oxygen  Post-op Assessment: Report given to RN and Post -op Vital signs reviewed and stable  Post vital signs: Reviewed and stable  Last Vitals:  Vitals Value Taken Time  BP    Temp    Pulse 92 08/01/2018 12:43 PM  Resp 9 08/01/2018 12:43 PM  SpO2 99 % 08/01/2018 12:43 PM  Vitals shown include unvalidated device data.  Last Pain:  Vitals:   08/01/18 0949  TempSrc:   PainSc: 0-No pain         Complications: No apparent anesthesia complications

## 2018-08-01 NOTE — Anesthesia Procedure Notes (Signed)
Procedure Name: LMA Insertion Date/Time: 08/01/2018 11:27 AM Performed by: Lieutenant Diego, CRNA Pre-anesthesia Checklist: Patient identified, Emergency Drugs available, Suction available and Patient being monitored Patient Re-evaluated:Patient Re-evaluated prior to induction Oxygen Delivery Method: Circle system utilized Preoxygenation: Pre-oxygenation with 100% oxygen Induction Type: IV induction Ventilation: Mask ventilation without difficulty LMA: LMA inserted LMA Size: 4.0 Number of attempts: 1 Placement Confirmation: positive ETCO2 and breath sounds checked- equal and bilateral Tube secured with: Tape Dental Injury: Teeth and Oropharynx as per pre-operative assessment

## 2018-08-02 ENCOUNTER — Encounter: Payer: Self-pay | Admitting: Hematology and Oncology

## 2018-08-02 ENCOUNTER — Inpatient Hospital Stay: Payer: Medicare Other

## 2018-08-02 ENCOUNTER — Encounter: Payer: Self-pay | Admitting: *Deleted

## 2018-08-02 ENCOUNTER — Encounter (HOSPITAL_COMMUNITY): Payer: Self-pay | Admitting: Surgery

## 2018-08-02 VITALS — BP 156/77 | HR 81 | Temp 98.0°F | Resp 18

## 2018-08-02 DIAGNOSIS — C50412 Malignant neoplasm of upper-outer quadrant of left female breast: Secondary | ICD-10-CM

## 2018-08-02 DIAGNOSIS — Z5111 Encounter for antineoplastic chemotherapy: Secondary | ICD-10-CM | POA: Diagnosis not present

## 2018-08-02 DIAGNOSIS — Z5112 Encounter for antineoplastic immunotherapy: Secondary | ICD-10-CM | POA: Diagnosis not present

## 2018-08-02 DIAGNOSIS — E119 Type 2 diabetes mellitus without complications: Secondary | ICD-10-CM | POA: Diagnosis not present

## 2018-08-02 DIAGNOSIS — Z170 Estrogen receptor positive status [ER+]: Principal | ICD-10-CM

## 2018-08-02 DIAGNOSIS — I10 Essential (primary) hypertension: Secondary | ICD-10-CM | POA: Diagnosis not present

## 2018-08-02 DIAGNOSIS — N183 Chronic kidney disease, stage 3 (moderate): Secondary | ICD-10-CM | POA: Diagnosis not present

## 2018-08-02 MED ORDER — DIPHENHYDRAMINE HCL 25 MG PO CAPS
ORAL_CAPSULE | ORAL | Status: AC
  Filled 2018-08-02: qty 1

## 2018-08-02 MED ORDER — HEPARIN SOD (PORK) LOCK FLUSH 100 UNIT/ML IV SOLN
500.0000 [IU] | Freq: Once | INTRAVENOUS | Status: AC | PRN
  Administered 2018-08-02: 500 [IU]
  Filled 2018-08-02: qty 5

## 2018-08-02 MED ORDER — ACETAMINOPHEN 325 MG PO TABS
ORAL_TABLET | ORAL | Status: AC
  Filled 2018-08-02: qty 2

## 2018-08-02 MED ORDER — SODIUM CHLORIDE 0.9 % IV SOLN
420.0000 mg | Freq: Once | INTRAVENOUS | Status: AC
  Administered 2018-08-02: 420 mg via INTRAVENOUS
  Filled 2018-08-02: qty 14

## 2018-08-02 MED ORDER — DIPHENHYDRAMINE HCL 50 MG/ML IJ SOLN
INTRAMUSCULAR | Status: AC
  Filled 2018-08-02: qty 1

## 2018-08-02 MED ORDER — SODIUM CHLORIDE 0.9 % IV SOLN
20.0000 mg | Freq: Once | INTRAVENOUS | Status: AC
  Administered 2018-08-02: 20 mg via INTRAVENOUS
  Filled 2018-08-02: qty 2

## 2018-08-02 MED ORDER — SODIUM CHLORIDE 0.9 % IV SOLN
65.0000 mg/m2 | Freq: Once | INTRAVENOUS | Status: AC
  Administered 2018-08-02: 162 mg via INTRAVENOUS
  Filled 2018-08-02: qty 27

## 2018-08-02 MED ORDER — TRASTUZUMAB CHEMO 150 MG IV SOLR
8.0000 mg/kg | Freq: Once | INTRAVENOUS | Status: AC
  Administered 2018-08-02: 1050 mg via INTRAVENOUS
  Filled 2018-08-02: qty 50

## 2018-08-02 MED ORDER — SODIUM CHLORIDE 0.9 % IV SOLN
Freq: Once | INTRAVENOUS | Status: AC
  Administered 2018-08-02: 08:00:00 via INTRAVENOUS
  Filled 2018-08-02: qty 250

## 2018-08-02 MED ORDER — ACETAMINOPHEN 325 MG PO TABS
650.0000 mg | ORAL_TABLET | Freq: Once | ORAL | Status: AC
  Administered 2018-08-02: 650 mg via ORAL

## 2018-08-02 MED ORDER — FAMOTIDINE IN NACL 20-0.9 MG/50ML-% IV SOLN: 20.0000 mg | Freq: Once | INTRAVENOUS | Status: DC

## 2018-08-02 MED ORDER — SODIUM CHLORIDE 0.9% FLUSH
10.0000 mL | INTRAVENOUS | Status: DC | PRN
  Administered 2018-08-02: 10 mL
  Filled 2018-08-02: qty 10

## 2018-08-02 MED ORDER — DIPHENHYDRAMINE HCL 50 MG/ML IJ SOLN
25.0000 mg | Freq: Once | INTRAMUSCULAR | Status: AC
  Administered 2018-08-02: 25 mg via INTRAVENOUS

## 2018-08-02 NOTE — Patient Instructions (Signed)
[facility] Discharge Instructions for Patients Receiving Chemotherapy  Today you received the following chemotherapy agents Herceptin Perjeta Taxol  To help prevent nausea and vomiting after your treatment, we encourage you to take your nausea medication as prescribed.   If you develop nausea and vomiting that is not controlled by your nausea medication, call the clinic.   BELOW ARE SYMPTOMS THAT SHOULD BE REPORTED IMMEDIATELY:  *FEVER GREATER THAN 100.5 F  *CHILLS WITH OR WITHOUT FEVER  NAUSEA AND VOMITING THAT IS NOT CONTROLLED WITH YOUR NAUSEA MEDICATION  *UNUSUAL SHORTNESS OF BREATH  *UNUSUAL BRUISING OR BLEEDING  TENDERNESS IN MOUTH AND THROAT WITH OR WITHOUT PRESENCE OF ULCERS  *URINARY PROBLEMS  *BOWEL PROBLEMS  UNUSUAL RASH Items with * indicate a potential emergency and should be followed up as soon as possible.  Feel free to call the clinic should you have any questions or concerns. The clinic phone number is [phone].  Please show the [facility] at check-in to the Emergency Department and triage nurse.  Trastuzumab injection for infusion(Herceptin) What is this medicine? TRASTUZUMAB (tras TOO zoo mab) is a monoclonal antibody. It is used to treat breast cancer and stomach cancer. This medicine may be used for other purposes; ask your health care provider or pharmacist if you have questions. COMMON BRAND NAME(S): Herceptin, Calla Kicks, OGIVRI What should I tell my health care provider before I take this medicine? They need to know if you have any of these conditions: -heart disease -heart failure -lung or breathing disease, like asthma -an unusual or allergic reaction to trastuzumab, benzyl alcohol, or other medications, foods, dyes, or preservatives -pregnant or trying to get pregnant -breast-feeding How should I use this medicine? This drug is given as an infusion into a vein. It is administered in a hospital or clinic by a  specially trained health care professional. Talk to your pediatrician regarding the use of this medicine in children. This medicine is not approved for use in children. Overdosage: If you think you have taken too much of this medicine contact a poison control center or emergency room at once. NOTE: This medicine is only for you. Do not share this medicine with others. What if I miss a dose? It is important not to miss a dose. Call your doctor or health care professional if you are unable to keep an appointment. What may interact with this medicine? This medicine may interact with the following medications: -certain types of chemotherapy, such as daunorubicin, doxorubicin, epirubicin, and idarubicin This list may not describe all possible interactions. Give your health care provider a list of all the medicines, herbs, non-prescription drugs, or dietary supplements you use. Also tell them if you smoke, drink alcohol, or use illegal drugs. Some items may interact with your medicine. What should I watch for while using this medicine? Visit your doctor for checks on your progress. Report any side effects. Continue your course of treatment even though you feel ill unless your doctor tells you to stop. Call your doctor or health care professional for advice if you get a fever, chills or sore throat, or other symptoms of a cold or flu. Do not treat yourself. Try to avoid being around people who are sick. You may experience fever, chills and shaking during your first infusion. These effects are usually mild and can be treated with other medicines. Report any side effects during the infusion to your health care professional. Fever and chills usually do not happen with later infusions. Do  not become pregnant while taking this medicine or for 7 months after stopping it. Women should inform their doctor if they wish to become pregnant or think they might be pregnant. Women of child-bearing potential will need to  have a negative pregnancy test before starting this medicine. There is a potential for serious side effects to an unborn child. Talk to your health care professional or pharmacist for more information. Do not breast-feed an infant while taking this medicine or for 7 months after stopping it. Women must use effective birth control with this medicine. What side effects may I notice from receiving this medicine? Side effects that you should report to your doctor or health care professional as soon as possible: -allergic reactions like skin rash, itching or hives, swelling of the face, lips, or tongue -chest pain or palpitations -cough -dizziness -feeling faint or lightheaded, falls -fever -general ill feeling or flu-like symptoms -signs of worsening heart failure like breathing problems; swelling in your legs and feet -unusually weak or tired Side effects that usually do not require medical attention (report to your doctor or health care professional if they continue or are bothersome): -bone pain -changes in taste -diarrhea -joint pain -nausea/vomiting -weight loss This list may not describe all possible side effects. Call your doctor for medical advice about side effects. You may report side effects to FDA at 1-800-FDA-1088. Where should I keep my medicine? This drug is given in a hospital or clinic and will not be stored at home. NOTE: This sheet is a summary. It may not cover all possible information. If you have questions about this medicine, talk to your doctor, pharmacist, or health care provider.  2019 Elsevier/Gold Standard (2016-05-16 14:37:52)  Pertuzumab injection(Perjeta) What is this medicine? PERTUZUMAB (per TOOZ ue mab) is a monoclonal antibody. It is used to treat breast cancer. This medicine may be used for other purposes; ask your health care provider or pharmacist if you have questions. COMMON BRAND NAME(S): PERJETA What should I tell my health care provider before I  take this medicine? They need to know if you have any of these conditions: -heart disease -heart failure -high blood pressure -history of irregular heart beat -recent or ongoing radiation therapy -an unusual or allergic reaction to pertuzumab, other medicines, foods, dyes, or preservatives -pregnant or trying to get pregnant -breast-feeding How should I use this medicine? This medicine is for infusion into a vein. It is given by a health care professional in a hospital or clinic setting. Talk to your pediatrician regarding the use of this medicine in children. Special care may be needed. Overdosage: If you think you have taken too much of this medicine contact a poison control center or emergency room at once. NOTE: This medicine is only for you. Do not share this medicine with others. What if I miss a dose? It is important not to miss your dose. Call your doctor or health care professional if you are unable to keep an appointment. What may interact with this medicine? Interactions are not expected. Give your health care provider a list of all the medicines, herbs, non-prescription drugs, or dietary supplements you use. Also tell them if you smoke, drink alcohol, or use illegal drugs. Some items may interact with your medicine. This list may not describe all possible interactions. Give your health care provider a list of all the medicines, herbs, non-prescription drugs, or dietary supplements you use. Also tell them if you smoke, drink alcohol, or use illegal drugs. Some items may  interact with your medicine. What should I watch for while using this medicine? Your condition will be monitored carefully while you are receiving this medicine. Report any side effects. Continue your course of treatment even though you feel ill unless your doctor tells you to stop. Do not become pregnant while taking this medicine or for 7 months after stopping it. Women should inform their doctor if they wish to  become pregnant or think they might be pregnant. Women of child-bearing potential will need to have a negative pregnancy test before starting this medicine. There is a potential for serious side effects to an unborn child. Talk to your health care professional or pharmacist for more information. Do not breast-feed an infant while taking this medicine or for 7 months after stopping it. Women must use effective birth control with this medicine. Call your doctor or health care professional for advice if you get a fever, chills or sore throat, or other symptoms of a cold or flu. Do not treat yourself. Try to avoid being around people who are sick. You may experience fever, chills, and headache during the infusion. Report any side effects during the infusion to your health care professional. What side effects may I notice from receiving this medicine? Side effects that you should report to your doctor or health care professional as soon as possible: -breathing problems -chest pain or palpitations -dizziness -feeling faint or lightheaded -fever or chills -skin rash, itching or hives -sore throat -swelling of the face, lips, or tongue -swelling of the legs or ankles -unusually weak or tired Side effects that usually do not require medical attention (report to your doctor or health care professional if they continue or are bothersome): -diarrhea -hair loss -nausea, vomiting -tiredness This list may not describe all possible side effects. Call your doctor for medical advice about side effects. You may report side effects to FDA at 1-800-FDA-1088. Where should I keep my medicine? This drug is given in a hospital or clinic and will not be stored at home. NOTE: This sheet is a summary. It may not cover all possible information. If you have questions about this medicine, talk to your doctor, pharmacist, or health care provider.  2019 Elsevier/Gold Standard (2015-06-24 12:08:50)   Paclitaxel  injection(Taxol) What is this medicine? PACLITAXEL (PAK li TAX el) is a chemotherapy drug. It targets fast dividing cells, like cancer cells, and causes these cells to die. This medicine is used to treat ovarian cancer, breast cancer, lung cancer, Kaposi's sarcoma, and other cancers. This medicine may be used for other purposes; ask your health care provider or pharmacist if you have questions. COMMON BRAND NAME(S): Onxol, Taxol What should I tell my health care provider before I take this medicine? They need to know if you have any of these conditions: -history of irregular heartbeat -liver disease -low blood counts, like low white cell, platelet, or red cell counts -lung or breathing disease, like asthma -tingling of the fingers or toes, or other nerve disorder -an unusual or allergic reaction to paclitaxel, alcohol, polyoxyethylated castor oil, other chemotherapy, other medicines, foods, dyes, or preservatives -pregnant or trying to get pregnant -breast-feeding How should I use this medicine? This drug is given as an infusion into a vein. It is administered in a hospital or clinic by a specially trained health care professional. Talk to your pediatrician regarding the use of this medicine in children. Special care may be needed. Overdosage: If you think you have taken too much of this medicine contact  a poison control center or emergency room at once. NOTE: This medicine is only for you. Do not share this medicine with others. What if I miss a dose? It is important not to miss your dose. Call your doctor or health care professional if you are unable to keep an appointment. What may interact with this medicine? Do not take this medicine with any of the following medications: -disulfiram -metronidazole This medicine may also interact with the following medications: -antiviral medicines for hepatitis, HIV or AIDS -certain antibiotics like erythromycin and clarithromycin -certain  medicines for fungal infections like ketoconazole and itraconazole -certain medicines for seizures like carbamazepine, phenobarbital, phenytoin -gemfibrozil -nefazodone -rifampin -St. John's wort This list may not describe all possible interactions. Give your health care provider a list of all the medicines, herbs, non-prescription drugs, or dietary supplements you use. Also tell them if you smoke, drink alcohol, or use illegal drugs. Some items may interact with your medicine. What should I watch for while using this medicine? Your condition will be monitored carefully while you are receiving this medicine. You will need important blood work done while you are taking this medicine. This medicine can cause serious allergic reactions. To reduce your risk you will need to take other medicine(s) before treatment with this medicine. If you experience allergic reactions like skin rash, itching or hives, swelling of the face, lips, or tongue, tell your doctor or health care professional right away. In some cases, you may be given additional medicines to help with side effects. Follow all directions for their use. This drug may make you feel generally unwell. This is not uncommon, as chemotherapy can affect healthy cells as well as cancer cells. Report any side effects. Continue your course of treatment even though you feel ill unless your doctor tells you to stop. Call your doctor or health care professional for advice if you get a fever, chills or sore throat, or other symptoms of a cold or flu. Do not treat yourself. This drug decreases your body's ability to fight infections. Try to avoid being around people who are sick. This medicine may increase your risk to bruise or bleed. Call your doctor or health care professional if you notice any unusual bleeding. Be careful brushing and flossing your teeth or using a toothpick because you may get an infection or bleed more easily. If you have any dental work  done, tell your dentist you are receiving this medicine. Avoid taking products that contain aspirin, acetaminophen, ibuprofen, naproxen, or ketoprofen unless instructed by your doctor. These medicines may hide a fever. Do not become pregnant while taking this medicine. Women should inform their doctor if they wish to become pregnant or think they might be pregnant. There is a potential for serious side effects to an unborn child. Talk to your health care professional or pharmacist for more information. Do not breast-feed an infant while taking this medicine. Men are advised not to father a child while receiving this medicine. This product may contain alcohol. Ask your pharmacist or healthcare provider if this medicine contains alcohol. Be sure to tell all healthcare providers you are taking this medicine. Certain medicines, like metronidazole and disulfiram, can cause an unpleasant reaction when taken with alcohol. The reaction includes flushing, headache, nausea, vomiting, sweating, and increased thirst. The reaction can last from 30 minutes to several hours. What side effects may I notice from receiving this medicine? Side effects that you should report to your doctor or health care professional as soon as  possible: -allergic reactions like skin rash, itching or hives, swelling of the face, lips, or tongue -breathing problems -changes in vision -fast, irregular heartbeat -high or low blood pressure -mouth sores -pain, tingling, numbness in the hands or feet -signs of decreased platelets or bleeding - bruising, pinpoint red spots on the skin, black, tarry stools, blood in the urine -signs of decreased red blood cells - unusually weak or tired, feeling faint or lightheaded, falls -signs of infection - fever or chills, cough, sore throat, pain or difficulty passing urine -signs and symptoms of liver injury like dark yellow or brown urine; general ill feeling or flu-like symptoms; light-colored  stools; loss of appetite; nausea; right upper belly pain; unusually weak or tired; yellowing of the eyes or skin -swelling of the ankles, feet, hands -unusually slow heartbeat Side effects that usually do not require medical attention (report to your doctor or health care professional if they continue or are bothersome): -diarrhea -hair loss -loss of appetite -muscle or joint pain -nausea, vomiting -pain, redness, or irritation at site where injected -tiredness This list may not describe all possible side effects. Call your doctor for medical advice about side effects. You may report side effects to FDA at 1-800-FDA-1088. Where should I keep my medicine? This drug is given in a hospital or clinic and will not be stored at home. NOTE: This sheet is a summary. It may not cover all possible information. If you have questions about this medicine, talk to your doctor, pharmacist, or health care provider.  2019 Elsevier/Gold Standard (2017-01-23 13:14:55)

## 2018-08-02 NOTE — Progress Notes (Signed)
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  Discussed one-time $1000 Radio broadcast assistant to assist with personal expenses while going through treatment.  Gave my card if interested in applying and for any additional financial questions or concerns. She verbalized understanding.

## 2018-08-05 ENCOUNTER — Telehealth: Payer: Self-pay | Admitting: Cardiology

## 2018-08-05 ENCOUNTER — Telehealth: Payer: Self-pay

## 2018-08-05 NOTE — Telephone Encounter (Signed)
Patient states she gets Eliquis from Patient Assistance Program and is almost out. She wants to know if we call it in or if it comes automatically. / tg

## 2018-08-05 NOTE — Telephone Encounter (Signed)
Eliquis has not arrived yet from patient assistance, pt will call patient support [phone]   I told her we have samples if there is a delay

## 2018-08-05 NOTE — Telephone Encounter (Signed)
Pt was told she had to reapply for the assistance, Eliquis will be faxing the application here for the patient to fill out. She will come by this afternoon to get it.

## 2018-08-05 NOTE — Telephone Encounter (Signed)
Left message for patient to call back regarding following up after 1st time infusion of Herceptin/Perjeta/Taxol.

## 2018-08-06 ENCOUNTER — Telehealth: Payer: Self-pay

## 2018-08-06 NOTE — Telephone Encounter (Signed)
Spoke with patient to follow up after first treatment.  Pt reported blood sugar on Friday was 585, however it has come down since then.  Today patient reports 235.  Patient denies headache, increased thirst, blurred vision, or dizziness. Nurse educated patient that she received decadron on 2/27 which has side effect of hyperglycemia.  Pt voiced understanding and will continue to monitor sugars.  Patient denies any fever.  Pt has had diarrhea however says this is normal prior to infusions.  Patient with 3 loose stools on 08/05/2018.  Pt is using OTC anti diarrhea medications per MD recommendations - and pushing fluids.  Nurse encouraged pt to notify is she is having more than 4 loose stools in a day. Pt reports feeling well overall.  Will follow up with MD on 3/6.

## 2018-08-08 NOTE — Progress Notes (Signed)
Patient Care Team: [name], MD as PCP - General (Family Medicine) Harl Bowie Alphonse Guild, MD as PCP - Cardiology (Cardiology)  DIAGNOSIS:    ICD-10-CM   1. 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 Malignant neoplasm of upper-outer quadrant of left breast in female, estrogen receptor positive (HCC) C50.412 Iron and TIBC   Z17.0 Folate, Serum    Vitamin B12  3. Normocytic anemia D64.9 Iron and TIBC    Folate, Serum    Vitamin B12  4. B12 deficiency E53.8 Iron and TIBC    Folate, Serum    Vitamin B12    SUMMARY OF ONCOLOGIC HISTORY:   Malignant neoplasm of upper-outer quadrant of left breast in female, estrogen receptor positive ([place])   07/09/2018 Initial Diagnosis    Screening mammogram detected spiculated mass in the left breast upper outer quadrant, 2.6 cm by MRI, axilla negative, biopsy revealed grade 2 IDC with DCIS, ER 100%, PR 70%, Ki-67 10%, HER-2 3+ positive, T2N0 stage Ib clinical stage    07/25/2018 Cancer Staging    Staging form: Breast, AJCC 8th Edition - Clinical stage from 07/25/2018: Stage IB (cT2, cN0, cM0, G2, ER+, PR+, HER2+) - Signed by Nicholas Lose, MD on 07/25/2018    08/02/2018 -  Chemotherapy    The patient had trastuzumab (HERCEPTIN) 1,050 mg in sodium chloride 0.9 % 250 mL chemo infusion, 8 mg/kg = 1,050 mg, Intravenous,  Once, 1 of 6 cycles Administration: 1,050 mg (08/02/2018) PACLitaxel (TAXOL) 162 mg in sodium chloride 0.9 % 250 mL chemo infusion (</= 16m/m2), 65 mg/m2 = 162 mg (100 % of original dose 65 mg/m2), Intravenous,  Once, 1 of 4 cycles Dose modification: 65 mg/m2 (original dose 65 mg/m2, Cycle 1, Reason: Provider Judgment) Administration: 162 mg (08/02/2018) pertuzumab (PERJETA) 420 mg in sodium chloride 0.9 % 250 mL chemo infusion, 420 mg (100 % of original dose 420 mg), Intravenous, Once, 1 of 6 cycles Dose modification: 420 mg (original dose 420 mg, Cycle 1, Reason: Provider  Judgment) Administration: 420 mg (08/02/2018)  for chemotherapy treatment.      CHIEF COMPLIANT: Cycle 2 Taxol  INTERVAL HISTORY: Destiny PORTILLAis a 73y.o. with above-mentioned history of left breast cancer here for her second neoadjuvant chemotherapy treatment of weekly Taxol with Herceptin and Perjeta every 3 weeks. An ECHO from 07/30/18 showed an ejection fraction in the range of 60-65%. She is a participant in the [employer] clinical trial. She presents to the clinic today with her daughter and granddaughter. She had difficult sleeping last night because she was anxious about treatment. She reports 3 epsides of diarrhea that improved with Imodium and denies nausea. She reports difficulty drinking enough water each day. Her labs from today show: WBC 8.7, Hg 10.6, platelets 290, ANC 6.9. She reports a history of anemia for which she took oral iron and elevated sugars following surgery and at her last treatment, between 178-220.   REVIEW OF SYSTEMS:   Constitutional: Denies fevers, chills or abnormal weight loss Eyes: Denies blurriness of vision Ears, nose, mouth, throat, and face: Denies mucositis or sore throat Respiratory: Denies cough, dyspnea or wheezes Cardiovascular: Denies palpitation, chest discomfort Gastrointestinal: Denies nausea, heartburn (+) diarrhea Skin: Denies abnormal skin rashes Lymphatics: Denies new lymphadenopathy or easy bruising Neurological: Denies numbness, tingling or new weaknesses Behavioral/Psych: (+) anxiety (+) difficulty sleeping Extremities: No lower extremity edema Breast: denies any pain or lumps  or nodules in either breasts All other systems were reviewed with the patient and are negative.  I have reviewed the past medical history, past surgical history, social history and family history with the patient and they are unchanged from previous note.  ALLERGIES:  is allergic to levaquin [levofloxacin] and codeine.  MEDICATIONS:  Current Outpatient  Medications  Medication Sig Dispense Refill  . acetaminophen (TYLENOL) 500 MG tablet Take 1,000 mg by mouth every 6 (six) hours as needed for moderate pain or headache.    . albuterol (PROVENTIL HFA;VENTOLIN HFA) 108 (90 Base) MCG/ACT inhaler Inhale 2 puffs into the lungs every 6 (six) hours as needed for wheezing or shortness of breath.    Marland Kitchen apixaban (ELIQUIS) 5 MG TABS tablet Take 1 tablet (5 mg total) by mouth 2 (two) times daily. 60 tablet 0  . aspirin EC 81 MG tablet Take 81 mg by mouth at bedtime.    Marland Kitchen CINNAMON PO Take 1 tablet by mouth daily.    Marland Kitchen diltiazem (CARDIZEM CD) 300 MG 24 hr capsule Take 1 capsule (300 mg total) by mouth daily. (Patient taking differently: Take 300 mg by mouth daily before breakfast. ) 90 capsule 3  . diphenoxylate-atropine (LOMOTIL) 2.5-0.025 MG tablet Take 1 tablet by mouth 4 (four) times daily as needed for diarrhea or loose stools. 30 tablet 3  . glyBURIDE (DIABETA) 5 MG tablet Take 10 mg by mouth 2 (two) times daily.  0  . insulin detemir (LEVEMIR) 100 UNIT/ML injection Inject 0.25 mLs (25 Units total) into the skin at bedtime. (Patient taking differently: Inject 14 Units into the skin at bedtime. ) 10 mL 0  . lidocaine-prilocaine (EMLA) cream Apply to affected area once 30 g 3  . lisinopril-hydrochlorothiazide (PRINZIDE,ZESTORETIC) 20-12.5 MG tablet Take 1/2 tablet (10-6.25 mg) once daily. 45 tablet 3  . LORazepam (ATIVAN) 0.5 MG tablet Take 1 tablet (0.5 mg total) by mouth at bedtime as needed for sleep. (Patient not taking: Reported on 07/26/2018) 30 tablet 0  . metFORMIN (GLUCOPHAGE) 500 MG tablet Take 1,000 mg by mouth 2 (two) times daily with a meal.     . niacin 500 MG tablet Take 500 mg by mouth daily.    . Omega-3 1000 MG CAPS Take 1,000 mg by mouth 2 (two) times daily.    . ondansetron (ZOFRAN) 8 MG tablet Take 1 tablet (8 mg total) by mouth 2 (two) times daily as needed (Nausea or vomiting). (Patient not taking: Reported on 07/30/2018) 30 tablet 1  .  oxyCODONE (OXY IR/ROXICODONE) 5 MG immediate release tablet Take 1 tablet (5 mg total) by mouth every 6 (six) hours as needed for severe pain. 12 tablet 0  . pravastatin (PRAVACHOL) 20 MG tablet Take 20 mg by mouth daily before breakfast.   0  . prochlorperazine (COMPAZINE) 10 MG tablet Take 1 tablet (10 mg total) by mouth every 6 (six) hours as needed (Nausea or vomiting). (Patient not taking: Reported on 07/30/2018) 30 tablet 1   No current facility-administered medications for this visit.     PHYSICAL EXAMINATION: ECOG PERFORMANCE STATUS: 1 - Symptomatic but completely ambulatory  Vitals:   08/09/18 1008  BP: (!) 153/77  Pulse: 100  Resp: 16  Temp: 98.7 F (37.1 C)  SpO2: 100%   Filed Weights   08/09/18 1008  Weight: 278 lb 4.8 oz (126.2 kg)    GENERAL: alert, no distress and comfortable SKIN: skin color, texture, turgor are normal, no rashes or significant lesions EYES: normal, Conjunctiva  are pink and non-injected, sclera clear OROPHARYNX: no exudate, no erythema and lips, buccal mucosa, and tongue normal  NECK: supple, thyroid normal size, non-tender, without nodularity LYMPH: no palpable lymphadenopathy in the cervical, axillary or inguinal LUNGS: clear to auscultation and percussion with normal breathing effort HEART: regular rate & rhythm and no murmurs and no lower extremity edema ABDOMEN: abdomen soft, non-tender and normal bowel sounds MUSCULOSKELETAL: no cyanosis of digits and no clubbing  NEURO: alert & oriented x 3 with fluent speech, no focal motor/sensory deficits EXTREMITIES: No lower extremity edema  LABORATORY DATA:  I have reviewed the data as listed CMP Latest Ref Rng & Units 08/09/2018 07/30/2018 02/15/2018  Glucose 70 - 99 mg/dL 229(H) 59(L) 204(H)  BUN 8 - 23 mg/dL 30(H) 27(H) 26(H)  Creatinine 0.44 - 1.00 mg/dL 0.87 0.82 0.74  Sodium 135 - 145 mmol/L 134(L) 138 136  Potassium 3.5 - 5.1 mmol/L 4.5 4.4 3.4(L)  Chloride 98 - 111 mmol/L 103 102 102  CO2  22 - 32 mmol/L 22 23 25   Calcium 8.9 - 10.3 mg/dL 9.1 9.9 8.4(L)  Total Protein 6.5 - 8.1 g/dL 7.1 7.5 -  Total Bilirubin 0.3 - 1.2 mg/dL 0.5 0.4 -  Alkaline Phos 38 - 126 U/L 90 98 -  AST 15 - 41 U/L 21 19 -  ALT 0 - 44 U/L 22 17 -    Lab Results  Component Value Date   WBC 8.7 08/09/2018   HGB 10.6 (L) 08/09/2018   HCT 33.4 (L) 08/09/2018   MCV 80.5 08/09/2018   PLT 290 08/09/2018   NEUTROABS 6.9 08/09/2018    ASSESSMENT & PLAN:  Malignant neoplasm of upper-outer quadrant of left breast in female, estrogen receptor positive ([place]) 07/09/2018: Screening mammogram detected spiculated mass in the left breast upper outer quadrant, 2.6 cm by MRI, axilla negative, 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 Neoadjuvant chemotherapy with Taxol weekly x12 Herceptin Perjeta every 3 weeks followed by Herceptin and Perjeta versus Kadcyla maintenance for 1 year 2. Followed by breast conserving surgery if possible with sentinel lymph node study 3. Followed by adjuvant radiation therapy if patient had lumpectomy 4.  Followed by adjuvant antiestrogen therapy ------------------------------------------------------------------------------------------------------------------------------------------------------------------- Current treatment: Cycle 2 day 1 Taxol weekly with Herceptin and Perjeta every 3 weeks Echocardiogram 07/30/2018: EF 60 to 65%  Chemo toxicities: 1.  Elevated blood sugars: Patient will discuss with her primary care physician. 2.  Diarrhea intermittently: Well controlled with Imodium 3.  Normocytic anemia: I will check iron studies D40 and folic acid with today's blood work.  Return to clinic weekly for chemo and every other week for follow-up with me.     Orders Placed This Encounter  Procedures  . Ferritin    Standing Status:   Future    Standing Expiration Date:   08/09/2019  .  Iron and TIBC    Standing Status:   Future    Standing Expiration Date:   08/09/2019  . Folate, Serum    Standing Status:   Future    Standing Expiration Date:   08/09/2019  . Vitamin B12    Standing Status:   Future    Standing Expiration Date:   08/09/2019   The patient has a good understanding of the overall plan. she agrees with it. she will call with any problems that may develop before the next visit here.  Nicholas Lose,  MD 08/09/2018  Julious Oka Dorshimer am acting as scribe for Dr. Nicholas Lose.  I have reviewed the above documentation for accuracy and completeness, and I agree with the above.

## 2018-08-09 ENCOUNTER — Inpatient Hospital Stay: Payer: Medicare Other

## 2018-08-09 ENCOUNTER — Inpatient Hospital Stay: Payer: Medicare Other | Attending: Hematology and Oncology

## 2018-08-09 ENCOUNTER — Inpatient Hospital Stay (HOSPITAL_BASED_OUTPATIENT_CLINIC_OR_DEPARTMENT_OTHER): Payer: Medicare Other | Admitting: Hematology and Oncology

## 2018-08-09 VITALS — BP 153/77 | HR 100 | Temp 98.7°F | Resp 16 | Ht 66.0 in | Wt 278.3 lb

## 2018-08-09 DIAGNOSIS — R739 Hyperglycemia, unspecified: Secondary | ICD-10-CM | POA: Insufficient documentation

## 2018-08-09 DIAGNOSIS — Z5111 Encounter for antineoplastic chemotherapy: Secondary | ICD-10-CM | POA: Diagnosis not present

## 2018-08-09 DIAGNOSIS — E538 Deficiency of other specified B group vitamins: Secondary | ICD-10-CM

## 2018-08-09 DIAGNOSIS — D509 Iron deficiency anemia, unspecified: Secondary | ICD-10-CM | POA: Diagnosis not present

## 2018-08-09 DIAGNOSIS — Z170 Estrogen receptor positive status [ER+]: Secondary | ICD-10-CM | POA: Diagnosis not present

## 2018-08-09 DIAGNOSIS — R7309 Other abnormal glucose: Secondary | ICD-10-CM | POA: Diagnosis not present

## 2018-08-09 DIAGNOSIS — C50412 Malignant neoplasm of upper-outer quadrant of left female breast: Secondary | ICD-10-CM | POA: Diagnosis not present

## 2018-08-09 DIAGNOSIS — N183 Chronic kidney disease, stage 3 (moderate): Secondary | ICD-10-CM

## 2018-08-09 DIAGNOSIS — Z79899 Other long term (current) drug therapy: Secondary | ICD-10-CM | POA: Insufficient documentation

## 2018-08-09 DIAGNOSIS — R197 Diarrhea, unspecified: Secondary | ICD-10-CM | POA: Diagnosis not present

## 2018-08-09 DIAGNOSIS — E860 Dehydration: Secondary | ICD-10-CM | POA: Diagnosis not present

## 2018-08-09 DIAGNOSIS — Z7901 Long term (current) use of anticoagulants: Secondary | ICD-10-CM | POA: Insufficient documentation

## 2018-08-09 DIAGNOSIS — D649 Anemia, unspecified: Secondary | ICD-10-CM

## 2018-08-09 DIAGNOSIS — Z5112 Encounter for antineoplastic immunotherapy: Secondary | ICD-10-CM | POA: Diagnosis not present

## 2018-08-09 DIAGNOSIS — D630 Anemia in neoplastic disease: Secondary | ICD-10-CM

## 2018-08-09 DIAGNOSIS — E1165 Type 2 diabetes mellitus with hyperglycemia: Secondary | ICD-10-CM

## 2018-08-09 DIAGNOSIS — Z794 Long term (current) use of insulin: Secondary | ICD-10-CM

## 2018-08-09 DIAGNOSIS — Z95828 Presence of other vascular implants and grafts: Secondary | ICD-10-CM | POA: Insufficient documentation

## 2018-08-09 LAB — COMPREHENSIVE METABOLIC PANEL
ALT: 22 U/L (ref 0–44)
AST: 21 U/L (ref 15–41)
Albumin: 3.7 g/dL (ref 3.5–5.0)
Alkaline Phosphatase: 90 U/L (ref 38–126)
Anion gap: 9 (ref 5–15)
BUN: 30 mg/dL — ABNORMAL HIGH (ref 8–23)
CO2: 22 mmol/L (ref 22–32)
Calcium: 9.1 mg/dL (ref 8.9–10.3)
Chloride: 103 mmol/L (ref 98–111)
Creatinine, Ser: 0.87 mg/dL (ref 0.44–1.00)
GFR calc non Af Amer: >60 mL/min (ref >60)
Glucose, Bld: 229 mg/dL — ABNORMAL HIGH (ref 70–99)
Potassium: 4.5 mmol/L (ref 3.5–5.1)
Sodium: 134 mmol/L — ABNORMAL LOW (ref 135–145)
Total Bilirubin: 0.5 mg/dL (ref 0.3–1.2)
Total Protein: 7.1 g/dL (ref 6.5–8.1)

## 2018-08-09 LAB — CBC WITH DIFFERENTIAL (CANCER CENTER ONLY)
ABS IMMATURE GRANULOCYTES: 0.07 10*3/uL (ref 0.00–0.07)
Basophils Absolute: 0 10*3/uL (ref 0.0–0.1)
Basophils Relative: 0 %
Eosinophils Absolute: 0.3 10*3/uL (ref 0.0–0.5)
Eosinophils Relative: 3 %
HCT: 33.4 % — ABNORMAL LOW (ref 36.0–46.0)
Hemoglobin: 10.6 g/dL — ABNORMAL LOW (ref 12.0–15.0)
Immature Granulocytes: 1 %
Lymphocytes Relative: 13 %
Lymphs Abs: 1.1 10*3/uL (ref 0.7–4.0)
MCH: 25.5 pg — ABNORMAL LOW (ref 26.0–34.0)
MCHC: 31.7 g/dL (ref 30.0–36.0)
MCV: 80.5 fL (ref 80.0–100.0)
Monocytes Absolute: 0.3 10*3/uL (ref 0.1–1.0)
Monocytes Relative: 4 %
NEUTROS ABS: 6.9 10*3/uL (ref 1.7–7.7)
NEUTROS PCT: 79 %
Platelet Count: 290 10*3/uL (ref 150–400)
RBC: 4.15 MIL/uL (ref 3.87–5.11)
RDW: 14.5 % (ref 11.5–15.5)
WBC Count: 8.7 10*3/uL (ref 4.0–10.5)
nRBC: 0 % (ref 0.0–0.2)

## 2018-08-09 LAB — IRON AND TIBC
Iron: 18 ug/dL — ABNORMAL LOW (ref 28–170)
Saturation Ratios: 5 % — ABNORMAL LOW (ref 10.4–31.8)
TIBC: 334 ug/dL (ref 250–450)
UIBC: 316 ug/dL

## 2018-08-09 LAB — HEMOGLOBIN A1C
HEMOGLOBIN A1C: 8.1 % — AB (ref 4.8–5.6)
MEAN PLASMA GLUCOSE: 185.77 mg/dL

## 2018-08-09 LAB — FERRITIN: Ferritin: 125 ng/mL (ref 11–307)

## 2018-08-09 LAB — VITAMIN B12: Vitamin B-12: 347 pg/mL (ref 180–914)

## 2018-08-09 LAB — FOLATE: FOLATE: 6.8 ng/mL (ref >5.9)

## 2018-08-09 MED ORDER — HEPARIN SOD (PORK) LOCK FLUSH 100 UNIT/ML IV SOLN
500.0000 [IU] | Freq: Once | INTRAVENOUS | Status: AC | PRN
  Administered 2018-08-09: 500 [IU]
  Filled 2018-08-09: qty 5

## 2018-08-09 MED ORDER — SODIUM CHLORIDE 0.9% FLUSH
10.0000 mL | Freq: Once | INTRAVENOUS | Status: AC
  Administered 2018-08-09: 10 mL
  Filled 2018-08-09: qty 10

## 2018-08-09 MED ORDER — FAMOTIDINE IN NACL 20-0.9 MG/50ML-% IV SOLN: 20.0000 mg | Freq: Once | INTRAVENOUS | Status: DC

## 2018-08-09 MED ORDER — SODIUM CHLORIDE 0.9 % IV SOLN
20.0000 mg | Freq: Once | INTRAVENOUS | Status: AC
  Administered 2018-08-09: 20 mg via INTRAVENOUS
  Filled 2018-08-09: qty 2

## 2018-08-09 MED ORDER — SODIUM CHLORIDE 0.9 % IV SOLN
Freq: Once | INTRAVENOUS | Status: AC
  Administered 2018-08-09: 11:00:00 via INTRAVENOUS
  Filled 2018-08-09: qty 250

## 2018-08-09 MED ORDER — DIPHENHYDRAMINE HCL 50 MG/ML IJ SOLN
INTRAMUSCULAR | Status: AC
  Filled 2018-08-09: qty 1

## 2018-08-09 MED ORDER — DIPHENHYDRAMINE HCL 50 MG/ML IJ SOLN
25.0000 mg | Freq: Once | INTRAMUSCULAR | Status: AC
  Administered 2018-08-09: 25 mg via INTRAVENOUS

## 2018-08-09 MED ORDER — SODIUM CHLORIDE 0.9 % IV SOLN
65.0000 mg/m2 | Freq: Once | INTRAVENOUS | Status: AC
  Administered 2018-08-09: 162 mg via INTRAVENOUS
  Filled 2018-08-09: qty 27

## 2018-08-09 NOTE — Patient Instructions (Signed)
Paclitaxel injection What is this medicine? PACLITAXEL (PAK li TAX el) is a chemotherapy drug. It targets fast dividing cells, like cancer cells, and causes these cells to die. This medicine is used to treat ovarian cancer, breast cancer, lung cancer, Kaposi's sarcoma, and other cancers. This medicine may be used for other purposes; ask your health care provider or pharmacist if you have questions. COMMON BRAND NAME(S): Onxol, Taxol What should I tell my health care provider before I take this medicine? They need to know if you have any of these conditions: -history of irregular heartbeat -liver disease -low blood counts, like low white cell, platelet, or red cell counts -lung or breathing disease, like asthma -tingling of the fingers or toes, or other nerve disorder -an unusual or allergic reaction to paclitaxel, alcohol, polyoxyethylated castor oil, other chemotherapy, other medicines, foods, dyes, or preservatives -pregnant or trying to get pregnant -breast-feeding How should I use this medicine? This drug is given as an infusion into a vein. It is administered in a hospital or clinic by a specially trained health care professional. Talk to your pediatrician regarding the use of this medicine in children. Special care may be needed. Overdosage: If you think you have taken too much of this medicine contact a poison control center or emergency room at once. NOTE: This medicine is only for you. Do not share this medicine with others. What if I miss a dose? It is important not to miss your dose. Call your doctor or health care professional if you are unable to keep an appointment. What may interact with this medicine? Do not take this medicine with any of the following medications: -disulfiram -metronidazole This medicine may also interact with the following medications: -antiviral medicines for hepatitis, HIV or AIDS -certain antibiotics like erythromycin and clarithromycin -certain  medicines for fungal infections like ketoconazole and itraconazole -certain medicines for seizures like carbamazepine, phenobarbital, phenytoin -gemfibrozil -nefazodone -rifampin -St. John's wort This list may not describe all possible interactions. Give your health care provider a list of all the medicines, herbs, non-prescription drugs, or dietary supplements you use. Also tell them if you smoke, drink alcohol, or use illegal drugs. Some items may interact with your medicine. What should I watch for while using this medicine? Your condition will be monitored carefully while you are receiving this medicine. You will need important blood work done while you are taking this medicine. This medicine can cause serious allergic reactions. To reduce your risk you will need to take other medicine(s) before treatment with this medicine. If you experience allergic reactions like skin rash, itching or hives, swelling of the face, lips, or tongue, tell your doctor or health care professional right away. In some cases, you may be given additional medicines to help with side effects. Follow all directions for their use. This drug may make you feel generally unwell. This is not uncommon, as chemotherapy can affect healthy cells as well as cancer cells. Report any side effects. Continue your course of treatment even though you feel ill unless your doctor tells you to stop. Call your doctor or health care professional for advice if you get a fever, chills or sore throat, or other symptoms of a cold or flu. Do not treat yourself. This drug decreases your body's ability to fight infections. Try to avoid being around people who are sick. This medicine may increase your risk to bruise or bleed. Call your doctor or health care professional if you notice any unusual bleeding. Be careful brushing  and flossing your teeth or using a toothpick because you may get an infection or bleed more easily. If you have any dental work  done, tell your dentist you are receiving this medicine. Avoid taking products that contain aspirin, acetaminophen, ibuprofen, naproxen, or ketoprofen unless instructed by your doctor. These medicines may hide a fever. Do not become pregnant while taking this medicine. Women should inform their doctor if they wish to become pregnant or think they might be pregnant. There is a potential for serious side effects to an unborn child. Talk to your health care professional or pharmacist for more information. Do not breast-feed an infant while taking this medicine. Men are advised not to father a child while receiving this medicine. This product may contain alcohol. Ask your pharmacist or healthcare provider if this medicine contains alcohol. Be sure to tell all healthcare providers you are taking this medicine. Certain medicines, like metronidazole and disulfiram, can cause an unpleasant reaction when taken with alcohol. The reaction includes flushing, headache, nausea, vomiting, sweating, and increased thirst. The reaction can last from 30 minutes to several hours. What side effects may I notice from receiving this medicine? Side effects that you should report to your doctor or health care professional as soon as possible: -allergic reactions like skin rash, itching or hives, swelling of the face, lips, or tongue -breathing problems -changes in vision -fast, irregular heartbeat -high or low blood pressure -mouth sores -pain, tingling, numbness in the hands or feet -signs of decreased platelets or bleeding - bruising, pinpoint red spots on the skin, black, tarry stools, blood in the urine -signs of decreased red blood cells - unusually weak or tired, feeling faint or lightheaded, falls -signs of infection - fever or chills, cough, sore throat, pain or difficulty passing urine -signs and symptoms of liver injury like dark yellow or brown urine; general ill feeling or flu-like symptoms; light-colored  stools; loss of appetite; nausea; right upper belly pain; unusually weak or tired; yellowing of the eyes or skin -swelling of the ankles, feet, hands -unusually slow heartbeat Side effects that usually do not require medical attention (report to your doctor or health care professional if they continue or are bothersome): -diarrhea -hair loss -loss of appetite -muscle or joint pain -nausea, vomiting -pain, redness, or irritation at site where injected -tiredness This list may not describe all possible side effects. Call your doctor for medical advice about side effects. You may report side effects to FDA at 1-800-FDA-1088. Where should I keep my medicine? This drug is given in a hospital or clinic and will not be stored at home. NOTE: This sheet is a summary. It may not cover all possible information. If you have questions about this medicine, talk to your doctor, pharmacist, or health care provider.  2019 Elsevier/Gold Standard (2017-01-23 13:14:55)

## 2018-08-09 NOTE — Assessment & Plan Note (Addendum)
07/09/2018: Screening mammogram detected spiculated mass in the left breast upper outer quadrant, 2.6 cm by MRI, axilla negative, 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 Neoadjuvant chemotherapy with Taxol weekly x12 Herceptin Perjeta every 3 weeks followed by Herceptin and Perjeta versus Kadcyla maintenance for 1 year 2. Followed by breast conserving surgery if possible with sentinel lymph node study 3. Followed by adjuvant radiation therapy if patient had lumpectomy 4.  Followed by adjuvant antiestrogen therapy ------------------------------------------------------------------------------------------------------------------------------------------------------------------- Current treatment: Cycle 2 day 1 Taxol weekly with Herceptin and Perjeta every 3 weeks Echocardiogram 07/30/2018: EF 60 to 65%  Chemo toxicities: 1.  Elevated blood sugars: Patient will discuss with her primary care physician. 2.  Diarrhea intermittently: Well controlled with Imodium 3.  Normocytic anemia: I will check iron studies T25 and folic acid with today's blood work.  Return to clinic weekly for chemo and every other week for follow-up with me.

## 2018-08-14 ENCOUNTER — Telehealth: Payer: Self-pay | Admitting: *Deleted

## 2018-08-14 NOTE — Telephone Encounter (Signed)
Call to notify pt that she has been approved for [employer] patient assistance program. Pt notified and voiced understanding.

## 2018-08-16 ENCOUNTER — Other Ambulatory Visit: Payer: Self-pay

## 2018-08-16 ENCOUNTER — Inpatient Hospital Stay: Payer: Medicare Other

## 2018-08-16 VITALS — BP 143/59 | HR 68 | Temp 97.6°F | Resp 17 | Wt 281.2 lb

## 2018-08-16 DIAGNOSIS — Z170 Estrogen receptor positive status [ER+]: Principal | ICD-10-CM

## 2018-08-16 DIAGNOSIS — C50412 Malignant neoplasm of upper-outer quadrant of left female breast: Secondary | ICD-10-CM

## 2018-08-16 DIAGNOSIS — Z95828 Presence of other vascular implants and grafts: Secondary | ICD-10-CM

## 2018-08-16 DIAGNOSIS — D509 Iron deficiency anemia, unspecified: Secondary | ICD-10-CM | POA: Diagnosis not present

## 2018-08-16 DIAGNOSIS — Z5111 Encounter for antineoplastic chemotherapy: Secondary | ICD-10-CM | POA: Diagnosis not present

## 2018-08-16 DIAGNOSIS — Z5112 Encounter for antineoplastic immunotherapy: Secondary | ICD-10-CM | POA: Diagnosis not present

## 2018-08-16 DIAGNOSIS — R739 Hyperglycemia, unspecified: Secondary | ICD-10-CM | POA: Diagnosis not present

## 2018-08-16 DIAGNOSIS — E860 Dehydration: Secondary | ICD-10-CM | POA: Diagnosis not present

## 2018-08-16 LAB — CBC WITH DIFFERENTIAL (CANCER CENTER ONLY)
Abs Immature Granulocytes: 0.02 10*3/uL (ref 0.00–0.07)
Basophils Absolute: 0 10*3/uL (ref 0.0–0.1)
Basophils Relative: 0 %
EOS PCT: 2 %
Eosinophils Absolute: 0.1 10*3/uL (ref 0.0–0.5)
HCT: 32.1 % — ABNORMAL LOW (ref 36.0–46.0)
Hemoglobin: 10 g/dL — ABNORMAL LOW (ref 12.0–15.0)
Immature Granulocytes: 0 %
Lymphocytes Relative: 23 %
Lymphs Abs: 1.3 10*3/uL (ref 0.7–4.0)
MCH: 25.3 pg — ABNORMAL LOW (ref 26.0–34.0)
MCHC: 31.2 g/dL (ref 30.0–36.0)
MCV: 81.1 fL (ref 80.0–100.0)
MONOS PCT: 7 %
Monocytes Absolute: 0.4 10*3/uL (ref 0.1–1.0)
NEUTROS ABS: 3.6 10*3/uL (ref 1.7–7.7)
Neutrophils Relative %: 68 %
Platelet Count: 269 10*3/uL (ref 150–400)
RBC: 3.96 MIL/uL (ref 3.87–5.11)
RDW: 14.6 % (ref 11.5–15.5)
WBC Count: 5.4 10*3/uL (ref 4.0–10.5)
nRBC: 0 % (ref 0.0–0.2)

## 2018-08-16 LAB — CMP (CANCER CENTER ONLY)
ALT: 24 U/L (ref 0–44)
ANION GAP: 9 (ref 5–15)
AST: 21 U/L (ref 15–41)
Albumin: 3.4 g/dL — ABNORMAL LOW (ref 3.5–5.0)
Alkaline Phosphatase: 86 U/L (ref 38–126)
BUN: 39 mg/dL — ABNORMAL HIGH (ref 8–23)
CO2: 19 mmol/L — ABNORMAL LOW (ref 22–32)
Calcium: 8.8 mg/dL — ABNORMAL LOW (ref 8.9–10.3)
Chloride: 104 mmol/L (ref 98–111)
Creatinine: 1 mg/dL (ref 0.44–1.00)
GFR, Est AFR Am: >60 mL/min (ref >60)
GFR, Estimated: 57 mL/min — ABNORMAL LOW (ref >60)
Glucose, Bld: 99 mg/dL (ref 70–99)
POTASSIUM: 4.6 mmol/L (ref 3.5–5.1)
Sodium: 132 mmol/L — ABNORMAL LOW (ref 135–145)
Total Bilirubin: 0.3 mg/dL (ref 0.3–1.2)
Total Protein: 6.9 g/dL (ref 6.5–8.1)

## 2018-08-16 MED ORDER — SODIUM CHLORIDE 0.9 % IV SOLN
20.0000 mg | Freq: Once | INTRAVENOUS | Status: AC
  Administered 2018-08-16: 20 mg via INTRAVENOUS
  Filled 2018-08-16: qty 2

## 2018-08-16 MED ORDER — DIPHENHYDRAMINE HCL 50 MG/ML IJ SOLN
INTRAMUSCULAR | Status: AC
  Filled 2018-08-16: qty 1

## 2018-08-16 MED ORDER — DIPHENHYDRAMINE HCL 50 MG/ML IJ SOLN
25.0000 mg | Freq: Once | INTRAMUSCULAR | Status: AC
  Administered 2018-08-16: 25 mg via INTRAVENOUS

## 2018-08-16 MED ORDER — HEPARIN SOD (PORK) LOCK FLUSH 100 UNIT/ML IV SOLN
500.0000 [IU] | Freq: Once | INTRAVENOUS | Status: AC | PRN
  Administered 2018-08-16: 500 [IU]
  Filled 2018-08-16: qty 5

## 2018-08-16 MED ORDER — SODIUM CHLORIDE 0.9% FLUSH
10.0000 mL | INTRAVENOUS | Status: DC | PRN
  Administered 2018-08-16: 10 mL
  Filled 2018-08-16: qty 10

## 2018-08-16 MED ORDER — SODIUM CHLORIDE 0.9% FLUSH
10.0000 mL | Freq: Once | INTRAVENOUS | Status: AC
  Administered 2018-08-16: 10 mL
  Filled 2018-08-16: qty 10

## 2018-08-16 MED ORDER — SODIUM CHLORIDE 0.9 % IV SOLN
65.0000 mg/m2 | Freq: Once | INTRAVENOUS | Status: AC
  Administered 2018-08-16: 162 mg via INTRAVENOUS
  Filled 2018-08-16: qty 27

## 2018-08-16 MED ORDER — SODIUM CHLORIDE 0.9 % IV SOLN
Freq: Once | INTRAVENOUS | Status: AC
  Administered 2018-08-16: 15:00:00 via INTRAVENOUS
  Filled 2018-08-16: qty 250

## 2018-08-16 NOTE — Patient Instructions (Signed)
[facility] Discharge Instructions for Patients Receiving Chemotherapy  Today you received the following chemotherapy agents:  Taxol.  To help prevent nausea and vomiting after your treatment, we encourage you to take your nausea medication as directed.   If you develop nausea and vomiting that is not controlled by your nausea medication, call the clinic.   BELOW ARE SYMPTOMS THAT SHOULD BE REPORTED IMMEDIATELY:  *FEVER GREATER THAN 100.5 F  *CHILLS WITH OR WITHOUT FEVER  NAUSEA AND VOMITING THAT IS NOT CONTROLLED WITH YOUR NAUSEA MEDICATION  *UNUSUAL SHORTNESS OF BREATH  *UNUSUAL BRUISING OR BLEEDING  TENDERNESS IN MOUTH AND THROAT WITH OR WITHOUT PRESENCE OF ULCERS  *URINARY PROBLEMS  *BOWEL PROBLEMS  UNUSUAL RASH Items with * indicate a potential emergency and should be followed up as soon as possible.  Feel free to call the clinic should you have any questions or concerns. The clinic phone number is [phone].  Please show the CHEMO ALERT CARD at check-in to the Emergency Department and triage nurse.   

## 2018-08-20 ENCOUNTER — Telehealth: Payer: Self-pay | Admitting: *Deleted

## 2018-08-20 ENCOUNTER — Telehealth: Payer: Self-pay | Admitting: Hematology and Oncology

## 2018-08-20 NOTE — Telephone Encounter (Signed)
Left message per 3/17 sch message - left message for patient to call back to r/s appt .

## 2018-08-20 NOTE — Telephone Encounter (Signed)
Call pt to notify that patient assistance Eliquis has arrived in office.

## 2018-08-21 NOTE — Progress Notes (Signed)
Patient Care Team: [name], MD as PCP - General (Family Medicine) Harl Bowie Alphonse Guild, MD as PCP - Cardiology (Cardiology)  DIAGNOSIS:    ICD-10-CM   1. Malignant neoplasm of upper-outer quadrant of left breast in female, estrogen receptor positive ([place]) C50.412    Z17.0     SUMMARY OF ONCOLOGIC HISTORY:   Malignant neoplasm of upper-outer quadrant of left breast in female, estrogen receptor positive ([place])   07/09/2018 Initial Diagnosis    Screening mammogram detected spiculated mass in the left breast upper outer quadrant, 2.6 cm by MRI, axilla negative, biopsy revealed grade 2 IDC with DCIS, ER 100%, PR 70%, Ki-67 10%, HER-2 3+ positive, T2N0 stage Ib clinical stage    07/25/2018 Cancer Staging    Staging form: Breast, AJCC 8th Edition - Clinical stage from 07/25/2018: Stage IB (cT2, cN0, cM0, G2, ER+, PR+, HER2+) - Signed by Nicholas Lose, MD on 07/25/2018    08/02/2018 -  Chemotherapy    The patient had trastuzumab (HERCEPTIN) 1,050 mg in sodium chloride 0.9 % 250 mL chemo infusion, 8 mg/kg = 1,050 mg, Intravenous,  Once, 1 of 6 cycles Administration: 1,050 mg (08/02/2018) PACLitaxel (TAXOL) 162 mg in sodium chloride 0.9 % 250 mL chemo infusion (</= 63m/m2), 65 mg/m2 = 162 mg (100 % of original dose 65 mg/m2), Intravenous,  Once, 1 of 4 cycles Dose modification: 65 mg/m2 (original dose 65 mg/m2, Cycle 1, Reason: Provider Judgment) Administration: 162 mg (08/02/2018), 162 mg (08/09/2018), 162 mg (08/16/2018) pertuzumab (PERJETA) 420 mg in sodium chloride 0.9 % 250 mL chemo infusion, 420 mg (100 % of original dose 420 mg), Intravenous, Once, 1 of 6 cycles Dose modification: 420 mg (original dose 420 mg, Cycle 1, Reason: Provider Judgment) Administration: 420 mg (08/02/2018)  for chemotherapy treatment.      CHIEF COMPLIANT: Cycle 4 Taxol, Perjeta, Herceptin  INTERVAL HISTORY: Destiny RAWLINSONis a 73y.o. with above-mentioned history of left breast cancer here for her third  neoadjuvant chemotherapy treatment of weekly Taxol with Herceptin and Perjeta every 3 weeks. She presents to the clinic today with her daughter. She tolerated her last treatment well, but notes worsening diarrhea, at least 10 episodes daily, for which Imodium did not provide improvement and Lomotil provided mild improvement. She reports fatigue, weakness, lack of appetite and difficulty drinking enough water but has been able to drink protein shakes.   REVIEW OF SYSTEMS:   Constitutional: Denies fevers, chills or abnormal weight loss (+) loss of appetite (+) fatigue (+) weakness Eyes: Denies blurriness of vision Ears, nose, mouth, throat, and face: Denies mucositis or sore throat Respiratory: Denies cough, dyspnea or wheezes Cardiovascular: Denies palpitation, chest discomfort Gastrointestinal: Denies nausea, heartburn (+) severe, constant diarrhea Skin: Denies abnormal skin rashes Lymphatics: Denies new lymphadenopathy or easy bruising Neurological: Denies numbness, tingling or new weaknesses Behavioral/Psych: Mood is stable, no new changes  Extremities: No lower extremity edema Breast: denies any pain or lumps or nodules in either breasts All other systems were reviewed with the patient and are negative.  I have reviewed the past medical history, past surgical history, social history and family history with the patient and they are unchanged from previous note.  ALLERGIES:  is allergic to levaquin [levofloxacin] and codeine.  MEDICATIONS:  Current Outpatient Medications  Medication Sig Dispense Refill  . acetaminophen (TYLENOL) 500 MG tablet Take 1,000 mg by mouth every 6 (six) hours as needed for moderate pain or headache.    . albuterol (PROVENTIL HFA;VENTOLIN HFA) 108 (  90 Base) MCG/ACT inhaler Inhale 2 puffs into the lungs every 6 (six) hours as needed for wheezing or shortness of breath.    Marland Kitchen apixaban (ELIQUIS) 5 MG TABS tablet Take 1 tablet (5 mg total) by mouth 2 (two) times daily.  60 tablet 0  . aspirin EC 81 MG tablet Take 81 mg by mouth at bedtime.    Marland Kitchen CINNAMON PO Take 1 tablet by mouth daily.    Marland Kitchen diltiazem (CARDIZEM CD) 300 MG 24 hr capsule Take 1 capsule (300 mg total) by mouth daily. (Patient taking differently: Take 300 mg by mouth daily before breakfast. ) 90 capsule 3  . diphenoxylate-atropine (LOMOTIL) 2.5-0.025 MG tablet Take 1 tablet by mouth 4 (four) times daily as needed for diarrhea or loose stools. 30 tablet 3  . glyBURIDE (DIABETA) 5 MG tablet Take 10 mg by mouth 2 (two) times daily.  0  . insulin detemir (LEVEMIR) 100 UNIT/ML injection Inject 0.25 mLs (25 Units total) into the skin at bedtime. (Patient taking differently: Inject 14 Units into the skin at bedtime. ) 10 mL 0  . lidocaine-prilocaine (EMLA) cream Apply to affected area once 30 g 3  . lisinopril-hydrochlorothiazide (PRINZIDE,ZESTORETIC) 20-12.5 MG tablet Take 1/2 tablet (10-6.25 mg) once daily. 45 tablet 3  . LORazepam (ATIVAN) 0.5 MG tablet Take 1 tablet (0.5 mg total) by mouth at bedtime as needed for sleep. (Patient not taking: Reported on 07/26/2018) 30 tablet 0  . metFORMIN (GLUCOPHAGE) 500 MG tablet Take 1,000 mg by mouth 2 (two) times daily with a meal.     . niacin 500 MG tablet Take 500 mg by mouth daily.    . Omega-3 1000 MG CAPS Take 1,000 mg by mouth 2 (two) times daily.    . ondansetron (ZOFRAN) 8 MG tablet Take 1 tablet (8 mg total) by mouth 2 (two) times daily as needed (Nausea or vomiting). (Patient not taking: Reported on 07/30/2018) 30 tablet 1  . oxyCODONE (OXY IR/ROXICODONE) 5 MG immediate release tablet Take 1 tablet (5 mg total) by mouth every 6 (six) hours as needed for severe pain. 12 tablet 0  . pravastatin (PRAVACHOL) 20 MG tablet Take 20 mg by mouth daily before breakfast.   0  . prochlorperazine (COMPAZINE) 10 MG tablet Take 1 tablet (10 mg total) by mouth every 6 (six) hours as needed (Nausea or vomiting). (Patient not taking: Reported on 07/30/2018) 30 tablet 1   No  current facility-administered medications for this visit.     PHYSICAL EXAMINATION: ECOG PERFORMANCE STATUS: 1 - Symptomatic but completely ambulatory  Vitals:   08/22/18 0824  BP: 133/80  Pulse: 95  Resp: 17  Temp: 97.7 F (36.5 C)  SpO2: 100%   Filed Weights   08/22/18 0824  Weight: 270 lb 3.2 oz (122.6 kg)    GENERAL: alert, no distress and comfortable SKIN: skin color, texture, turgor are normal, no rashes or significant lesions EYES: normal, Conjunctiva are pink and non-injected, sclera clear OROPHARYNX: no exudate, no erythema and lips, buccal mucosa, and tongue normal  NECK: supple, thyroid normal size, non-tender, without nodularity LYMPH: no palpable lymphadenopathy in the cervical, axillary or inguinal LUNGS: clear to auscultation and percussion with normal breathing effort HEART: regular rate & rhythm and no murmurs and no lower extremity edema ABDOMEN: abdomen soft, non-tender and normal bowel sounds MUSCULOSKELETAL: no cyanosis of digits and no clubbing  NEURO: alert & oriented x 3 with fluent speech, no focal motor/sensory deficits EXTREMITIES: No lower extremity edema  LABORATORY DATA:  I have reviewed the data as listed CMP Latest Ref Rng & Units 08/16/2018 08/09/2018 07/30/2018  Glucose 70 - 99 mg/dL 99 229(H) 59(L)  BUN 8 - 23 mg/dL 39(H) 30(H) 27(H)  Creatinine 0.44 - 1.00 mg/dL 1.00 0.87 0.82  Sodium 135 - 145 mmol/L 132(L) 134(L) 138  Potassium 3.5 - 5.1 mmol/L 4.6 4.5 4.4  Chloride 98 - 111 mmol/L 104 103 102  CO2 22 - 32 mmol/L 19(L) 22 23  Calcium 8.9 - 10.3 mg/dL 8.8(L) 9.1 9.9  Total Protein 6.5 - 8.1 g/dL 6.9 7.1 7.5  Total Bilirubin 0.3 - 1.2 mg/dL 0.3 0.5 0.4  Alkaline Phos 38 - 126 U/L 86 90 98  AST 15 - 41 U/L _0 ALT 0 - 44 U/L _1 Lab Results  Component Value Date   WBC 6.2 08/22/2018   HGB 10.3 (L) 08/22/2018   HCT 32.6 (L) 08/22/2018   MCV 80.5 08/22/2018   PLT 332 08/22/2018   NEUTROABS 4.6 08/22/2018     ASSESSMENT & PLAN:  Malignant neoplasm of upper-outer quadrant of left breast in female, estrogen receptor positive ([place]) 07/09/2018:Screening mammogram detected spiculated mass in the left breast upper outer quadrant, 2.6 cm by MRI, axilla negative, 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 Neoadjuvant chemotherapy withTaxol weekly x12 Herceptin Perjeta every 3 weeksfollowed by Herceptinand Perjeta versus Kadcylamaintenance for 1 year 2. Followed by breast conserving surgery if possible with sentinel lymph node study 3. Followed by adjuvant radiation therapy if patient had lumpectomy 4.Followed by adjuvant antiestrogen therapy ------------------------------------------------------------------------------------------------------------------------------------------------------------------- Current treatment: Tomorrow is Cycle 4 day 1 Taxol weekly with Herceptin and Perjeta every 3 weeks Echocardiogram 07/30/2018: EF 60 to 65%  Chemo toxicities: 1.  Elevated blood sugars: Patient will discuss with her primary care physician. 2.  Diarrhea  up to 10 times per day: I instructed her to schedule Lomotil 3 times a day and take Imodium as needed.  If necessary we will add Questran. 3.  Dehydration: We will give her 500 mL of normal saline today. 4.  Normocytic anemia: Iron studies revealed iron saturation of 5%, J88 and folic acid were normal ferritin was 125.  Plan to give IV iron with the next chemotherapy. We will request genetic consultation  Return to clinic weekly for chemo and every other week for follow-up with me.    No orders of the defined types were placed in this encounter.  The patient has a good understanding of the overall plan. she agrees with it. she will call with any problems that may develop before the next visit here.  Nicholas Lose, MD 08/22/2018  Julious Oka Dorshimer am  acting as scribe for Dr. Nicholas Lose.  I have reviewed the above documentation for accuracy and completeness, and I agree with the above.

## 2018-08-22 ENCOUNTER — Inpatient Hospital Stay: Payer: Medicare Other

## 2018-08-22 ENCOUNTER — Other Ambulatory Visit: Payer: Self-pay

## 2018-08-22 ENCOUNTER — Inpatient Hospital Stay (HOSPITAL_BASED_OUTPATIENT_CLINIC_OR_DEPARTMENT_OTHER): Payer: Medicare Other | Admitting: Hematology and Oncology

## 2018-08-22 DIAGNOSIS — D509 Iron deficiency anemia, unspecified: Secondary | ICD-10-CM | POA: Diagnosis not present

## 2018-08-22 DIAGNOSIS — Z170 Estrogen receptor positive status [ER+]: Secondary | ICD-10-CM

## 2018-08-22 DIAGNOSIS — R739 Hyperglycemia, unspecified: Secondary | ICD-10-CM

## 2018-08-22 DIAGNOSIS — Z5112 Encounter for antineoplastic immunotherapy: Secondary | ICD-10-CM | POA: Diagnosis not present

## 2018-08-22 DIAGNOSIS — Z95828 Presence of other vascular implants and grafts: Secondary | ICD-10-CM

## 2018-08-22 DIAGNOSIS — C50412 Malignant neoplasm of upper-outer quadrant of left female breast: Secondary | ICD-10-CM

## 2018-08-22 DIAGNOSIS — Z7901 Long term (current) use of anticoagulants: Secondary | ICD-10-CM

## 2018-08-22 DIAGNOSIS — Z5111 Encounter for antineoplastic chemotherapy: Secondary | ICD-10-CM | POA: Diagnosis not present

## 2018-08-22 DIAGNOSIS — E860 Dehydration: Secondary | ICD-10-CM | POA: Diagnosis not present

## 2018-08-22 LAB — CMP (CANCER CENTER ONLY)
ALT: 18 U/L (ref 0–44)
AST: 14 U/L — ABNORMAL LOW (ref 15–41)
Albumin: 3.6 g/dL (ref 3.5–5.0)
Alkaline Phosphatase: 85 U/L (ref 38–126)
Anion gap: 11 (ref 5–15)
BUN: 53 mg/dL — ABNORMAL HIGH (ref 8–23)
CO2: 16 mmol/L — ABNORMAL LOW (ref 22–32)
CREATININE: 1.3 mg/dL — AB (ref 0.44–1.00)
Calcium: 10 mg/dL (ref 8.9–10.3)
Chloride: 107 mmol/L (ref 98–111)
GFR, Est AFR Am: 48 mL/min — ABNORMAL LOW (ref >60)
GFR, Estimated: 42 mL/min — ABNORMAL LOW (ref >60)
Glucose, Bld: 170 mg/dL — ABNORMAL HIGH (ref 70–99)
Potassium: 4.8 mmol/L (ref 3.5–5.1)
SODIUM: 134 mmol/L — AB (ref 135–145)
TOTAL PROTEIN: 7.4 g/dL (ref 6.5–8.1)
Total Bilirubin: 0.4 mg/dL (ref 0.3–1.2)

## 2018-08-22 LAB — CBC WITH DIFFERENTIAL (CANCER CENTER ONLY)
Abs Immature Granulocytes: 0.03 10*3/uL (ref 0.00–0.07)
Basophils Absolute: 0.1 10*3/uL (ref 0.0–0.1)
Basophils Relative: 1 %
Eosinophils Absolute: 0.1 10*3/uL (ref 0.0–0.5)
Eosinophils Relative: 2 %
HCT: 32.6 % — ABNORMAL LOW (ref 36.0–46.0)
Hemoglobin: 10.3 g/dL — ABNORMAL LOW (ref 12.0–15.0)
Immature Granulocytes: 1 %
LYMPHS PCT: 16 %
Lymphs Abs: 1 10*3/uL (ref 0.7–4.0)
MCH: 25.4 pg — ABNORMAL LOW (ref 26.0–34.0)
MCHC: 31.6 g/dL (ref 30.0–36.0)
MCV: 80.5 fL (ref 80.0–100.0)
Monocytes Absolute: 0.4 10*3/uL (ref 0.1–1.0)
Monocytes Relative: 7 %
Neutro Abs: 4.6 10*3/uL (ref 1.7–7.7)
Neutrophils Relative %: 73 %
Platelet Count: 332 10*3/uL (ref 150–400)
RBC: 4.05 MIL/uL (ref 3.87–5.11)
RDW: 15.4 % (ref 11.5–15.5)
WBC Count: 6.2 10*3/uL (ref 4.0–10.5)
nRBC: 0 % (ref 0.0–0.2)

## 2018-08-22 MED ORDER — SODIUM CHLORIDE 0.9% FLUSH
10.0000 mL | Freq: Once | INTRAVENOUS | Status: AC
  Administered 2018-08-22: 10 mL
  Filled 2018-08-22: qty 10

## 2018-08-22 MED ORDER — SODIUM CHLORIDE 0.9 % IV SOLN
INTRAVENOUS | Status: DC
  Administered 2018-08-22: 09:00:00 via INTRAVENOUS
  Filled 2018-08-22 (×2): qty 250

## 2018-08-22 NOTE — Assessment & Plan Note (Addendum)
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 Neoadjuvant chemotherapy withTaxol weekly x12 Herceptin Perjeta every 3 weeksfollowed by Herceptinand Perjeta versus Kadcylamaintenance for 1 year 2. Followed by breast conserving surgery if possible with sentinel lymph node study 3. Followed by adjuvant radiation therapy if patient had lumpectomy 4.Followed by adjuvant antiestrogen therapy ------------------------------------------------------------------------------------------------------------------------------------------------------------------- Current treatment: Tomorrow is Cycle 4 day 1 Taxol weekly with Herceptin and Perjeta every 3 weeks Echocardiogram 07/30/2018: EF 60 to 65%  Chemo toxicities: 1.  Elevated blood sugars: Patient will discuss with her primary care physician. 2.  Diarrhea up to 10 times per day: I instructed her to schedule Lomotil 3 times a day and take Imodium as needed.  If necessary we will add Questran. 3.  Dehydration: We will give her 500 mL of normal saline today. 4.  Normocytic anemia: Iron studies revealed iron saturation of 5%, A06 and folic acid were normal ferritin was 125.  Return to clinic weekly for chemo and every other week for follow-up with me.

## 2018-08-23 ENCOUNTER — Other Ambulatory Visit: Payer: Self-pay

## 2018-08-23 ENCOUNTER — Other Ambulatory Visit: Payer: Medicare Other

## 2018-08-23 ENCOUNTER — Inpatient Hospital Stay: Payer: Medicare Other

## 2018-08-23 VITALS — BP 132/58 | HR 87 | Temp 98.0°F | Resp 17

## 2018-08-23 DIAGNOSIS — Z170 Estrogen receptor positive status [ER+]: Principal | ICD-10-CM

## 2018-08-23 DIAGNOSIS — R739 Hyperglycemia, unspecified: Secondary | ICD-10-CM | POA: Diagnosis not present

## 2018-08-23 DIAGNOSIS — Z5112 Encounter for antineoplastic immunotherapy: Secondary | ICD-10-CM | POA: Diagnosis not present

## 2018-08-23 DIAGNOSIS — D509 Iron deficiency anemia, unspecified: Secondary | ICD-10-CM | POA: Diagnosis not present

## 2018-08-23 DIAGNOSIS — Z5111 Encounter for antineoplastic chemotherapy: Secondary | ICD-10-CM | POA: Diagnosis not present

## 2018-08-23 DIAGNOSIS — C50412 Malignant neoplasm of upper-outer quadrant of left female breast: Secondary | ICD-10-CM

## 2018-08-23 DIAGNOSIS — E860 Dehydration: Secondary | ICD-10-CM | POA: Diagnosis not present

## 2018-08-23 DIAGNOSIS — Z95828 Presence of other vascular implants and grafts: Secondary | ICD-10-CM

## 2018-08-23 MED ORDER — SODIUM CHLORIDE 0.9 % IV SOLN
65.0000 mg/m2 | Freq: Once | INTRAVENOUS | Status: AC
  Administered 2018-08-23: 162 mg via INTRAVENOUS
  Filled 2018-08-23: qty 27

## 2018-08-23 MED ORDER — SODIUM CHLORIDE 0.9 % IV SOLN
Freq: Once | INTRAVENOUS | Status: AC
  Administered 2018-08-23: 09:00:00 via INTRAVENOUS
  Filled 2018-08-23: qty 250

## 2018-08-23 MED ORDER — ACETAMINOPHEN 325 MG PO TABS
ORAL_TABLET | ORAL | Status: AC
  Filled 2018-08-23: qty 2

## 2018-08-23 MED ORDER — SODIUM CHLORIDE 0.9 % IV SOLN
INTRAVENOUS | Status: DC
  Administered 2018-08-23: 10:00:00 via INTRAVENOUS
  Filled 2018-08-23 (×2): qty 250

## 2018-08-23 MED ORDER — SODIUM CHLORIDE 0.9 % IV SOLN
510.0000 mg | Freq: Once | INTRAVENOUS | Status: AC
  Administered 2018-08-23: 510 mg via INTRAVENOUS
  Filled 2018-08-23: qty 17

## 2018-08-23 MED ORDER — ACETAMINOPHEN 325 MG PO TABS
650.0000 mg | ORAL_TABLET | Freq: Once | ORAL | Status: AC
  Administered 2018-08-23: 650 mg via ORAL

## 2018-08-23 MED ORDER — SODIUM CHLORIDE 0.9% FLUSH
10.0000 mL | INTRAVENOUS | Status: DC | PRN
  Administered 2018-08-23: 10 mL
  Filled 2018-08-23: qty 10

## 2018-08-23 MED ORDER — SODIUM CHLORIDE 0.9 % IV SOLN
420.0000 mg | Freq: Once | INTRAVENOUS | Status: AC
  Administered 2018-08-23: 420 mg via INTRAVENOUS
  Filled 2018-08-23: qty 14

## 2018-08-23 MED ORDER — DIPHENHYDRAMINE HCL 50 MG/ML IJ SOLN
25.0000 mg | Freq: Once | INTRAMUSCULAR | Status: AC
  Administered 2018-08-23: 25 mg via INTRAVENOUS

## 2018-08-23 MED ORDER — HEPARIN SOD (PORK) LOCK FLUSH 100 UNIT/ML IV SOLN
500.0000 [IU] | Freq: Once | INTRAVENOUS | Status: AC | PRN
  Administered 2018-08-23: 500 [IU]
  Filled 2018-08-23: qty 5

## 2018-08-23 MED ORDER — TRASTUZUMAB CHEMO 150 MG IV SOLR
750.0000 mg | Freq: Once | INTRAVENOUS | Status: AC
  Administered 2018-08-23: 750 mg via INTRAVENOUS
  Filled 2018-08-23: qty 35.72

## 2018-08-23 MED ORDER — FAMOTIDINE IN NACL 20-0.9 MG/50ML-% IV SOLN: 20.0000 mg | Freq: Once | INTRAVENOUS | Status: DC

## 2018-08-23 MED ORDER — DIPHENHYDRAMINE HCL 50 MG/ML IJ SOLN
INTRAMUSCULAR | Status: AC
  Filled 2018-08-23: qty 1

## 2018-08-23 MED ORDER — SODIUM CHLORIDE 0.9 % IV SOLN
20.0000 mg | Freq: Once | INTRAVENOUS | Status: AC
  Administered 2018-08-23: 20 mg via INTRAVENOUS
  Filled 2018-08-23: qty 2

## 2018-08-23 NOTE — Patient Instructions (Addendum)
[facility] Discharge Instructions for Patients Receiving Chemotherapy  Today you received the following chemotherapy agents:  Herceptin, Perjeta, Taxol  To help prevent nausea and vomiting after your treatment, we encourage you to take your nausea medication as prescribed.   If you develop nausea and vomiting that is not controlled by your nausea medication, call the clinic.   BELOW ARE SYMPTOMS THAT SHOULD BE REPORTED IMMEDIATELY:  *FEVER GREATER THAN 100.5 F  *CHILLS WITH OR WITHOUT FEVER  NAUSEA AND VOMITING THAT IS NOT CONTROLLED WITH YOUR NAUSEA MEDICATION  *UNUSUAL SHORTNESS OF BREATH  *UNUSUAL BRUISING OR BLEEDING  TENDERNESS IN MOUTH AND THROAT WITH OR WITHOUT PRESENCE OF ULCERS  *URINARY PROBLEMS  *BOWEL PROBLEMS  UNUSUAL RASH Items with * indicate a potential emergency and should be followed up as soon as possible.  Feel free to call the clinic should you have any questions or concerns. The clinic phone number is [phone].  Please show the [place] at check-in to the Emergency Department and triage nurse.  Ferumoxytol injection What is this medicine? FERUMOXYTOL is an iron complex. Iron is used to make healthy red blood cells, which carry oxygen and nutrients throughout the body. This medicine is used to treat iron deficiency anemia. This medicine may be used for other purposes; ask your health care provider or pharmacist if you have questions. COMMON BRAND NAME(S): Feraheme What should I tell my health care provider before I take this medicine? They need to know if you have any of these conditions: -anemia not caused by low iron levels -high levels of iron in the blood -magnetic resonance imaging (MRI) test scheduled -an unusual or allergic reaction to iron, other medicines, foods, dyes, or preservatives -pregnant or trying to get pregnant -breast-feeding How should I use this medicine? This medicine is for injection into a vein.  It is given by a health care professional in a hospital or clinic setting. Talk to your pediatrician regarding the use of this medicine in children. Special care may be needed. Overdosage: If you think you have taken too much of this medicine contact a poison control center or emergency room at once. NOTE: This medicine is only for you. Do not share this medicine with others. What if I miss a dose? It is important not to miss your dose. Call your doctor or health care professional if you are unable to keep an appointment. What may interact with this medicine? This medicine may interact with the following medications: -other iron products This list may not describe all possible interactions. Give your health care provider a list of all the medicines, herbs, non-prescription drugs, or dietary supplements you use. Also tell them if you smoke, drink alcohol, or use illegal drugs. Some items may interact with your medicine. What should I watch for while using this medicine? Visit your doctor or healthcare professional regularly. Tell your doctor or healthcare professional if your symptoms do not start to get better or if they get worse. You may need blood work done while you are taking this medicine. You may need to follow a special diet. Talk to your doctor. Foods that contain iron include: whole grains/cereals, dried fruits, beans, or peas, leafy green vegetables, and organ meats (liver, kidney). What side effects may I notice from receiving this medicine? Side effects that you should report to your doctor or health care professional as soon as possible: -allergic reactions like skin rash, itching or hives, swelling of the face, lips, or tongue -  breathing problems -changes in blood pressure -feeling faint or lightheaded, falls -fever or chills -flushing, sweating, or hot feelings -swelling of the ankles or feet Side effects that usually do not require medical attention (report to your doctor or  health care professional if they continue or are bothersome): -diarrhea -headache -nausea, vomiting -stomach pain This list may not describe all possible side effects. Call your doctor for medical advice about side effects. You may report side effects to FDA at 1-800-FDA-1088. Where should I keep my medicine? This drug is given in a hospital or clinic and will not be stored at home. NOTE: This sheet is a summary. It may not cover all possible information. If you have questions about this medicine, talk to your doctor, pharmacist, or health care provider.  2019 Elsevier/Gold Standard (2016-07-10 20:21:10)

## 2018-08-30 ENCOUNTER — Inpatient Hospital Stay: Payer: Medicare Other

## 2018-08-30 ENCOUNTER — Other Ambulatory Visit: Payer: Self-pay

## 2018-08-30 VITALS — BP 126/50 | HR 63 | Temp 97.6°F | Resp 18 | Ht 66.0 in | Wt 268.8 lb

## 2018-08-30 DIAGNOSIS — Z95828 Presence of other vascular implants and grafts: Secondary | ICD-10-CM

## 2018-08-30 DIAGNOSIS — C50412 Malignant neoplasm of upper-outer quadrant of left female breast: Secondary | ICD-10-CM

## 2018-08-30 DIAGNOSIS — E860 Dehydration: Secondary | ICD-10-CM | POA: Diagnosis not present

## 2018-08-30 DIAGNOSIS — Z170 Estrogen receptor positive status [ER+]: Principal | ICD-10-CM

## 2018-08-30 DIAGNOSIS — Z5111 Encounter for antineoplastic chemotherapy: Secondary | ICD-10-CM | POA: Diagnosis not present

## 2018-08-30 DIAGNOSIS — Z5112 Encounter for antineoplastic immunotherapy: Secondary | ICD-10-CM | POA: Diagnosis not present

## 2018-08-30 DIAGNOSIS — R739 Hyperglycemia, unspecified: Secondary | ICD-10-CM | POA: Diagnosis not present

## 2018-08-30 DIAGNOSIS — D509 Iron deficiency anemia, unspecified: Secondary | ICD-10-CM | POA: Diagnosis not present

## 2018-08-30 LAB — CBC WITH DIFFERENTIAL (CANCER CENTER ONLY)
Abs Immature Granulocytes: 0.06 10*3/uL (ref 0.00–0.07)
Basophils Absolute: 0.1 10*3/uL (ref 0.0–0.1)
Basophils Relative: 1 %
EOS ABS: 0.1 10*3/uL (ref 0.0–0.5)
Eosinophils Relative: 1 %
HCT: 30.4 % — ABNORMAL LOW (ref 36.0–46.0)
Hemoglobin: 9.9 g/dL — ABNORMAL LOW (ref 12.0–15.0)
Immature Granulocytes: 1 %
LYMPHS ABS: 1.3 10*3/uL (ref 0.7–4.0)
Lymphocytes Relative: 14 %
MCH: 25.8 pg — ABNORMAL LOW (ref 26.0–34.0)
MCHC: 32.6 g/dL (ref 30.0–36.0)
MCV: 79.4 fL — ABNORMAL LOW (ref 80.0–100.0)
Monocytes Absolute: 0.7 10*3/uL (ref 0.1–1.0)
Monocytes Relative: 7 %
NRBC: 0 % (ref 0.0–0.2)
Neutro Abs: 6.9 10*3/uL (ref 1.7–7.7)
Neutrophils Relative %: 76 %
Platelet Count: 331 10*3/uL (ref 150–400)
RBC: 3.83 MIL/uL — ABNORMAL LOW (ref 3.87–5.11)
RDW: 16 % — ABNORMAL HIGH (ref 11.5–15.5)
WBC Count: 9.2 10*3/uL (ref 4.0–10.5)

## 2018-08-30 LAB — CMP (CANCER CENTER ONLY)
ALT: 15 U/L (ref 0–44)
AST: 18 U/L (ref 15–41)
Albumin: 3.6 g/dL (ref 3.5–5.0)
Alkaline Phosphatase: 75 U/L (ref 38–126)
Anion gap: 12 (ref 5–15)
BUN: 98 mg/dL — ABNORMAL HIGH (ref 8–23)
CO2: 13 mmol/L — ABNORMAL LOW (ref 22–32)
Calcium: 9.8 mg/dL (ref 8.9–10.3)
Chloride: 107 mmol/L (ref 98–111)
Creatinine: 1.72 mg/dL — ABNORMAL HIGH (ref 0.44–1.00)
GFR, Est AFR Am: 34 mL/min — ABNORMAL LOW (ref >60)
GFR, Estimated: 30 mL/min — ABNORMAL LOW (ref >60)
Glucose, Bld: 80 mg/dL (ref 70–99)
Potassium: 4.3 mmol/L (ref 3.5–5.1)
SODIUM: 132 mmol/L — AB (ref 135–145)
Total Bilirubin: 0.3 mg/dL (ref 0.3–1.2)
Total Protein: 7.1 g/dL (ref 6.5–8.1)

## 2018-08-30 MED ORDER — HEPARIN SOD (PORK) LOCK FLUSH 100 UNIT/ML IV SOLN
500.0000 [IU] | Freq: Once | INTRAVENOUS | Status: AC
  Administered 2018-08-30: 500 [IU]
  Filled 2018-08-30: qty 5

## 2018-08-30 MED ORDER — SODIUM CHLORIDE 0.9% FLUSH
10.0000 mL | Freq: Once | INTRAVENOUS | Status: AC
  Administered 2018-08-30: 10 mL
  Filled 2018-08-30: qty 10

## 2018-08-30 MED ORDER — SODIUM CHLORIDE 0.9 % IV SOLN
INTRAVENOUS | Status: DC
  Administered 2018-08-30: 14:00:00 via INTRAVENOUS
  Filled 2018-08-30 (×2): qty 250

## 2018-08-30 NOTE — Progress Notes (Signed)
Infusion nurse notified of creatinine level of 1.72.  Infusion nurse assessed information regarding recent concerns with diarrhea after Herceptin, Perjeta and Taxol on 3/19.  Pt reports no diarrhea in 14 hours.   Nurse reviewed with Dr. Jana Hakim, due to Dr. Lindi Adie being out of office.  Dr. Jana Hakim recommends holding treatment today, 1L of IVF's, and follow up next week.  Infusion nurse notified.

## 2018-08-30 NOTE — Progress Notes (Signed)
Orders placed IVF's.

## 2018-08-30 NOTE — Patient Instructions (Addendum)
Coronavirus (COVID-19) Are you at risk?  Are you at risk for the Coronavirus (COVID-19)?  To be considered HIGH RISK for Coronavirus (COVID-19), you have to meet the following criteria:  . Traveled to [place], [place], [place], [place] or [place]; or in the [place] to [place], [place], [place], or [place]; and have fever, cough, and shortness of breath within the last 2 weeks of travel OR . Been in close contact with a person diagnosed with COVID-19 within the last 2 weeks and have fever, cough, and shortness of breath . IF YOU DO NOT MEET THESE CRITERIA, YOU ARE CONSIDERED LOW RISK FOR COVID-19.  What to do if you are HIGH RISK for COVID-19?  Marland Kitchen If you are having a medical emergency, call 911. . Seek medical care right away. Before you go to a doctor's office, urgent care or emergency department, call ahead and tell them about your recent travel, contact with someone diagnosed with COVID-19, and your symptoms. You should receive instructions from your physician's office regarding next steps of care.  . When you arrive at healthcare provider, tell the healthcare staff immediately you have returned from visiting [place], [place], [place], [place] or [place]; or traveled in the [place] to [place], [place], [place], or [place]; in the last two weeks or you have been in close contact with a person diagnosed with COVID-19 in the last 2 weeks.   . Tell the health care staff about your symptoms: fever, cough and shortness of breath. . After you have been seen by a medical provider, you will be either: o Tested for (COVID-19) and discharged home on quarantine except to seek medical care if symptoms worsen, and asked to  - Stay home and avoid contact with others until you get your results (4-5 days)  - Avoid travel on public transportation if possible (such as bus, train, or airplane) or o Sent to the Emergency Department by EMS for evaluation, COVID-19 testing, and possible  admission depending on your condition and test results.  What to do if you are LOW RISK for COVID-19?  Reduce your risk of any infection by using the same precautions used for avoiding the common cold or flu:  Marland Kitchen Wash your hands often with soap and warm water for at least 20 seconds.  If soap and water are not readily available, use an alcohol-based hand sanitizer with at least 60% alcohol.  . If coughing or sneezing, cover your mouth and nose by coughing or sneezing into the elbow areas of your shirt or coat, into a tissue or into your sleeve (not your hands). . Avoid shaking hands with others and consider head nods or verbal greetings only. . Avoid touching your eyes, nose, or mouth with unwashed hands.  . Avoid close contact with people who are sick. . Avoid places or events with large numbers of people in one location, like concerts or sporting events. . Carefully consider travel plans you have or are making. . If you are planning any travel outside or inside the [place], visit the CDC's Travelers' Health webpage for the latest health notices. . If you have some symptoms but not all symptoms, continue to monitor at home and seek medical attention if your symptoms worsen. . If you are having a medical emergency, call 911.  [place] / e-Visit: [email or web address]         [facility]: [facility]: [phone]                   [facility]: [phone]   Rehydration, Adult Rehydration is the replacement of body fluids and salts and minerals (electrolytes) that are lost during dehydration. Dehydration is when there is not enough fluid or water in the body. This happens when you lose more fluids than you take in. Common causes of dehydration include:  Vomiting.  Diarrhea.  Excessive sweating, such as from heat exposure or  exercise.  Taking medicines that cause the body to lose excess fluid (diuretics).  Impaired kidney function.  Not drinking enough fluid.  Certain illnesses or infections.  Certain poorly controlled long-term (chronic) illnesses, such as diabetes, heart disease, and kidney disease.  Symptoms of mild dehydration may include thirst, dry lips and mouth, dry skin, and dizziness. Symptoms of severe dehydration may include increased heart rate, confusion, fainting, and not urinating. You can rehydrate by drinking certain fluids or getting fluids through an IV tube, as told by your health care provider. What are the risks? Generally, rehydration is safe. However, one problem that can happen is taking in too much fluid (overhydration). This is rare. If overhydration happens, it can cause an electrolyte imbalance, kidney failure, or a decrease in salt (sodium) levels in the body. How to rehydrate Follow instructions from your health care provider for rehydration. The kind of fluid you should drink and the amount you should drink depend on your condition.  If directed by your health care provider, drink an oral rehydration solution (ORS). This is a drink designed to treat dehydration that is found in pharmacies and retail stores. ? Make an ORS by following instructions on the package. ? Start by drinking small amounts, about  cup (120 mL) every 5-10 minutes. ? Slowly increase how much you drink until you have taken the amount recommended by your health care provider.  Drink enough clear fluids to keep your urine clear or pale yellow. If you were instructed to drink an ORS, finish the ORS first, then start slowly drinking other clear fluids. Drink fluids such as: ? Water. Do not drink only water. Doing that can lead to having too little sodium in your body (hyponatremia). ? Ice chips. ? Fruit juice that you have added water to (diluted juice). ? Low-calorie sports drinks.  If you are severely  dehydrated, your health care provider may recommend that you receive fluids through an IV tube in the hospital.  Do not take sodium tablets. Doing that can lead to the condition of having too much sodium in your body (hypernatremia). Eating while you rehydrate Follow instructions from your health care provider about what to eat while you rehydrate. Your health care provider may recommend that you slowly begin eating regular foods in small amounts.  Eat foods that contain a healthy balance of electrolytes, such as bananas, oranges, potatoes, tomatoes, and spinach.  Avoid foods that are greasy or contain a lot of fat or sugar.  In some cases, you may get nutrition through a feeding tube that is passed through your nose and into your stomach (nasogastric tube, or NG tube). This may be done if you have uncontrolled vomiting or diarrhea. Beverages to avoid Certain beverages may make dehydration worse. While you rehydrate, avoid:  Alcohol.  Caffeine.  Drinks that contain a lot of sugar. These include: ? High-calorie sports drinks. ? Fruit juice that is not diluted. ? Soda.  Check nutrition labels to see how  much sugar or caffeine a beverage contains. Signs of dehydration recovery You may be recovering from dehydration if:  You are urinating more often than before you started rehydrating.  Your urine is clear or pale yellow.  Your energy level improves.  You vomit less frequently.  You have diarrhea less frequently.  Your appetite improves or returns to normal.  You feel less dizzy or less light-headed.  Your skin tone and color start to look more normal. Contact a health care provider if:  You continue to have symptoms of mild dehydration, such as: ? Thirst. ? Dry lips. ? Slightly dry mouth. ? Dry, warm skin. ? Dizziness.  You continue to vomit or have diarrhea. Get help right away if:  You have symptoms of dehydration that get worse.  You  feel: ? Confused. ? Weak. ? Like you are going to faint.  You have not urinated in 6-8 hours.  You have very dark urine.  You have trouble breathing.  Your heart rate while sitting still is over 100 beats a minute.  You cannot drink fluids without vomiting.  You have vomiting or diarrhea that: ? Gets worse. ? Does not go away.  You have a fever. This information is not intended to replace advice given to you by your health care provider. Make sure you discuss any questions you have with your health care provider. Document Released: 08/14/2011 Document Revised: 12/10/2015 Document Reviewed: 07/16/2015 Elsevier Interactive Patient Education  2019 [employer].

## 2018-09-04 NOTE — Progress Notes (Signed)
Patient Care Team: [name], MD as PCP - General (Family Medicine) Harl Bowie Alphonse Guild, MD as PCP - Cardiology (Cardiology)  DIAGNOSIS:    ICD-10-CM   1. Malignant neoplasm of upper-outer quadrant of left breast in female, estrogen receptor positive ([place]) C50.412    Z17.0     SUMMARY OF ONCOLOGIC HISTORY:   Malignant neoplasm of upper-outer quadrant of left breast in female, estrogen receptor positive ([place])   07/09/2018 Initial Diagnosis    Screening mammogram detected spiculated mass in the left breast upper outer quadrant, 2.6 cm by MRI, axilla negative, biopsy revealed grade 2 IDC with DCIS, ER 100%, PR 70%, Ki-67 10%, HER-2 3+ positive, T2N0 stage Ib clinical stage    07/25/2018 Cancer Staging    Staging form: Breast, AJCC 8th Edition - Clinical stage from 07/25/2018: Stage IB (cT2, cN0, cM0, G2, ER+, PR+, HER2+) - Signed by Nicholas Lose, MD on 07/25/2018    08/02/2018 -  Chemotherapy    The patient had trastuzumab (HERCEPTIN) 1,050 mg in sodium chloride 0.9 % 250 mL chemo infusion, 8 mg/kg = 1,050 mg, Intravenous,  Once, 2 of 6 cycles Administration: 1,050 mg (08/02/2018), 750 mg (08/23/2018) PACLitaxel (TAXOL) 162 mg in sodium chloride 0.9 % 250 mL chemo infusion (</= 73m/m2), 65 mg/m2 = 162 mg (100 % of original dose 65 mg/m2), Intravenous,  Once, 2 of 4 cycles Dose modification: 65 mg/m2 (original dose 65 mg/m2, Cycle 1, Reason: Provider Judgment) Administration: 162 mg (08/02/2018), 162 mg (08/09/2018), 162 mg (08/23/2018), 162 mg (08/16/2018) pertuzumab (PERJETA) 420 mg in sodium chloride 0.9 % 250 mL chemo infusion, 420 mg (100 % of original dose 420 mg), Intravenous, Once, 2 of 2 cycles Dose modification: 420 mg (original dose 420 mg, Cycle 1, Reason: Provider Judgment) Administration: 420 mg (08/02/2018), 420 mg (08/23/2018)  for chemotherapy treatment.      CHIEF COMPLIANT: Cycle 5 Taxol  INTERVAL HISTORY: Destiny REBUCKis a 73y.o. with above-mentioned history of  left breast cancer currently undergoing neoadjuvant chemotherapy with weekly Taxol and Herceptin Perjeta every three weeks. She presents to the clinic today for Cycle 5 of Taxol.  Overall she appears to be tolerating Taxol extremely well.  She does have fatigue.  Denies any nausea or vomiting.   REVIEW OF SYSTEMS:   Constitutional: Denies fevers, chills or abnormal weight loss Eyes: Denies blurriness of vision Ears, nose, mouth, throat, and face: Denies mucositis or sore throat Respiratory: Denies cough, dyspnea or wheezes Cardiovascular: Denies palpitation, chest discomfort Gastrointestinal: Denies nausea, heartburn or change in bowel habits Skin: Denies abnormal skin rashes Lymphatics: Denies new lymphadenopathy or easy bruising Neurological: Denies numbness, tingling or new weaknesses Behavioral/Psych: Mood is stable, no new changes  Extremities: No lower extremity edema Breast: denies any pain or lumps or nodules in either breasts All other systems were reviewed with the patient and are negative.  I have reviewed the past medical history, past surgical history, social history and family history with the patient and they are unchanged from previous note.  ALLERGIES:  is allergic to levaquin [levofloxacin] and codeine.  MEDICATIONS:  Current Outpatient Medications  Medication Sig Dispense Refill  . acetaminophen (TYLENOL) 500 MG tablet Take 1,000 mg by mouth every 6 (six) hours as needed for moderate pain or headache.    . albuterol (PROVENTIL HFA;VENTOLIN HFA) 108 (90 Base) MCG/ACT inhaler Inhale 2 puffs into the lungs every 6 (six) hours as needed for wheezing or shortness of breath.    .Marland Kitchenapixaban (ELIQUIS)  5 MG TABS tablet Take 1 tablet (5 mg total) by mouth 2 (two) times daily. 60 tablet 0  . aspirin EC 81 MG tablet Take 81 mg by mouth at bedtime.    . cholestyramine (QUESTRAN) 4 g packet Take 1 packet (4 g total) by mouth 3 (three) times daily with meals. 60 each 12  . CINNAMON  PO Take 1 tablet by mouth daily.    Marland Kitchen diltiazem (CARDIZEM CD) 300 MG 24 hr capsule Take 1 capsule (300 mg total) by mouth daily. (Patient taking differently: Take 300 mg by mouth daily before breakfast. ) 90 capsule 3  . diphenoxylate-atropine (LOMOTIL) 2.5-0.025 MG tablet Take 1 tablet by mouth 4 (four) times daily as needed for diarrhea or loose stools. 120 tablet 3  . glyBURIDE (DIABETA) 5 MG tablet Take 10 mg by mouth 2 (two) times daily.  0  . insulin detemir (LEVEMIR) 100 UNIT/ML injection Inject 0.25 mLs (25 Units total) into the skin at bedtime. (Patient taking differently: Inject 14 Units into the skin at bedtime. ) 10 mL 0  . lidocaine-prilocaine (EMLA) cream Apply to affected area once 30 g 3  . LORazepam (ATIVAN) 0.5 MG tablet Take 1 tablet (0.5 mg total) by mouth at bedtime as needed for sleep. (Patient not taking: Reported on 07/26/2018) 30 tablet 0  . metFORMIN (GLUCOPHAGE) 500 MG tablet Take 1,000 mg by mouth 2 (two) times daily with a meal.     . niacin 500 MG tablet Take 500 mg by mouth daily.    . Omega-3 1000 MG CAPS Take 1,000 mg by mouth 2 (two) times daily.    . ondansetron (ZOFRAN) 8 MG tablet Take 1 tablet (8 mg total) by mouth 2 (two) times daily as needed (Nausea or vomiting). 30 tablet 1  . oxyCODONE (OXY IR/ROXICODONE) 5 MG immediate release tablet Take 1 tablet (5 mg total) by mouth every 6 (six) hours as needed for severe pain. 12 tablet 0  . pantoprazole (PROTONIX) 40 MG tablet Take 1 tablet (40 mg total) by mouth daily. 30 tablet 3  . pravastatin (PRAVACHOL) 20 MG tablet Take 20 mg by mouth daily before breakfast.   0  . prochlorperazine (COMPAZINE) 10 MG tablet Take 1 tablet (10 mg total) by mouth every 6 (six) hours as needed (Nausea or vomiting). 30 tablet 1   No current facility-administered medications for this visit.     PHYSICAL EXAMINATION: ECOG PERFORMANCE STATUS: 3 - Symptomatic, >50% confined to bed  Vitals:   09/06/18 1158  BP: (!) 114/49  Pulse:  81  Resp: 18  Temp: (!) 97.5 F (36.4 C)  SpO2: 96%   Filed Weights   09/06/18 1158  Weight: 262 lb 6.4 oz (119 kg)    GENERAL: alert, no distress and comfortable SKIN: skin color, texture, turgor are normal, no rashes or significant lesions EYES: normal, Conjunctiva are pink and non-injected, sclera clear OROPHARYNX: no exudate, no erythema and lips, buccal mucosa, and tongue normal  NECK: supple, thyroid normal size, non-tender, without nodularity LYMPH: no palpable lymphadenopathy in the cervical, axillary or inguinal LUNGS: clear to auscultation and percussion with normal breathing effort HEART: regular rate & rhythm and no murmurs and no lower extremity edema ABDOMEN: abdomen soft, non-tender and normal bowel sounds MUSCULOSKELETAL: no cyanosis of digits and no clubbing  NEURO: alert & oriented x 3 with fluent speech, no focal motor/sensory deficits EXTREMITIES: No lower extremity edema  LABORATORY DATA:  I have reviewed the data as listed CMP  Latest Ref Rng & Units 09/06/2018 08/30/2018 08/22/2018  Glucose 70 - 99 mg/dL 127(H) 80 170(H)  BUN 8 - 23 mg/dL 134(H) 98(H) 53(H)  Creatinine 0.44 - 1.00 mg/dL 2.05(H) 1.72(H) 1.30(H)  Sodium 135 - 145 mmol/L 133(L) 132(L) 134(L)  Potassium 3.5 - 5.1 mmol/L 3.7 4.3 4.8  Chloride 98 - 111 mmol/L 110 107 107  CO2 22 - 32 mmol/L 11(L) 13(L) 16(L)  Calcium 8.9 - 10.3 mg/dL 8.5(L) 9.8 10.0  Total Protein 6.5 - 8.1 g/dL 6.7 7.1 7.4  Total Bilirubin 0.3 - 1.2 mg/dL 0.3 0.3 0.4  Alkaline Phos 38 - 126 U/L 74 75 85  AST 15 - 41 U/L 15 18 14(L)  ALT 0 - 44 U/L 16 15 18     Lab Results  Component Value Date   WBC 13.3 (H) 09/06/2018   HGB 9.8 (L) 09/06/2018   HCT 29.8 (L) 09/06/2018   MCV 78.8 (L) 09/06/2018   PLT 269 09/06/2018   NEUTROABS 11.0 (H) 09/06/2018    ASSESSMENT & PLAN:  Malignant neoplasm of upper-outer quadrant of left breast in female, estrogen receptor positive ([place]) 07/09/2018:Screening mammogram detected spiculated  mass in the left breast upper outer quadrant, 2.6 cm by MRI, axilla negative, 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 Neoadjuvant chemotherapy withTaxol weekly x12 Herceptin Perjeta every 3 weeksfollowed by Herceptinand Perjeta versus Kadcylamaintenance for 1 year 2. Followed by breast conserving surgery if possible with sentinel lymph node study 3. Followed by adjuvant radiation therapy if patient had lumpectomy 4.Followed by adjuvant antiestrogen therapy ------------------------------------------------------------------------------------------------------------------------------------------------------------------- Current treatment: Cycle5day 1 Taxol weekly with Herceptin and Perjeta every 3 weeks (chemo being held for the last couple of weeks for severe diarrhea) Echocardiogram 07/30/2018: EF 60 to 65%  Chemo toxicities: 1.Elevated blood sugars: Today's blood sugar is 127. 2.Diarrhea  up to 10 times per day: Diarrhea is only slightly better with scheduling Lomotil 4 times a day.  She is getting progressively weaker.  I will start her on Questran 3 times a day scheduled doses with her meals so that we can stop the diarrhea. We cannot proceed with chemotherapy until the diarrhea gets better. Once her diarrhea gets better then we can reinitiate Taxol with Herceptin and discontinue Perjeta.  3.  Dehydration: We will give her 1000 mL of normal saline today. 4.Normocytic anemia: Iron studies revealed iron saturation of 5%, W29 and folic acid were normal ferritin was 125.  Plan to give IV iron second dose today. 5.  Sluggishness in her thought process: Chemotherapy related worsen because of diarrhea.  I am afraid that the patient is getting weaker and more frail.  We will assess her next week and determine the treatment plan.  Return to clinic in 1 week to assess her treatment plan.  We  will see if she can receive her chemotherapy next week without Perjeta.  We will only resume treatment if her diarrhea gets better.   No orders of the defined types were placed in this encounter.  The patient has a good understanding of the overall plan. she agrees with it. she will call with any problems that may develop before the next visit here.  Nicholas Lose, MD 09/06/2018  Julious Oka Dorshimer am acting as scribe for Dr. Nicholas Lose.  I have reviewed the above documentation for accuracy and completeness, and I agree with the above.

## 2018-09-05 ENCOUNTER — Telehealth (INDEPENDENT_AMBULATORY_CARE_PROVIDER_SITE_OTHER): Payer: Medicare Other | Admitting: Cardiology

## 2018-09-05 DIAGNOSIS — I4819 Other persistent atrial fibrillation: Secondary | ICD-10-CM | POA: Diagnosis not present

## 2018-09-05 DIAGNOSIS — N179 Acute kidney failure, unspecified: Secondary | ICD-10-CM

## 2018-09-05 DIAGNOSIS — Z7901 Long term (current) use of anticoagulants: Secondary | ICD-10-CM | POA: Diagnosis not present

## 2018-09-05 DIAGNOSIS — I10 Essential (primary) hypertension: Secondary | ICD-10-CM

## 2018-09-05 DIAGNOSIS — C50919 Malignant neoplasm of unspecified site of unspecified female breast: Secondary | ICD-10-CM | POA: Diagnosis not present

## 2018-09-05 DIAGNOSIS — Z79899 Other long term (current) drug therapy: Secondary | ICD-10-CM | POA: Diagnosis not present

## 2018-09-05 DIAGNOSIS — I272 Pulmonary hypertension, unspecified: Secondary | ICD-10-CM | POA: Diagnosis not present

## 2018-09-05 NOTE — Progress Notes (Signed)
Virtual Visit via Telephone Note    Evaluation Performed:  Follow-up visit  This visit type was conducted due to national recommendations for restrictions regarding the COVID-19 Pandemic (e.g. social distancing).  This format is felt to be most appropriate for this patient at this time.  All issues noted in this document were discussed and addressed.  No physical exam was performed (except for noted visual exam findings with Video Visits).  Please refer to the patient's chart (MyChart message for video visits and phone note for telephone visits) for the patient's consent to telehealth for [facility]. A verbal consent was also obtained at the beginning of our phone conversation  Date:  09/05/2018   ID:  Destiny Nash, DOB [birth year], MRN [id number]  Patient Location:  [place], [place]  Provider location:   [place], [place]    PCP:  [name], MD  Cardiologist:  Carlyle Dolly, MD  Electrophysiologist:  None   Chief Complaint:    History of Present Illness:    Destiny Nash is a 73 y.o. female who presents via audio/video conferencing for a telehealth visit today.     1. Afib - new diagnosis during 02/2018 admission - resistant to cardiversion at last outpatient f/u  - some increased palpitations recently. Started around the time her husbands passing about 10 days ago - compliant with meds   - last visit we increased diltiazem to 395m daily for palpitatoins - no recent palpitations. Heart rates have been at goal.  - no bleeding on eliquis.   2. Pulmonary HTN - 02/2018 echo PASP 65. Biatrial enlargement would suggests some diasotlic dysfunction though it was indeterminant by the study, LVEF was 55-60%. - 07/2018 echo PASP not reported, a reported RVSP is 28 (TR peak frad 13, RA pressure 15), unclear accuracy as this is reported only in the body of the report  - ANA neg, HIV neg, LFTs normal - needs sleep study, PFTs - she was resistant to sleep study at  last visit    3. Breast cancer - needs echo every 3 months as requested by her oncologist. Last echo 07/2018 LVEF 60-65%, normal RV function,  - she is on herceptic, taxol, pertuzumab   4. HTN lower prinzide to 10/6.25mg daily to allow room to increase dilt for her afib.   - compliant with meds  5. AKI - chemo induced diarrhea, poor hydration. Received IVFs 3/27     SH: husband recently passed away          The patient does not have symptoms concerning for COVID-19 infection (fever, chills, cough, or new shortness of breath).    Prior CV studies:   The following studies were reviewed today:  n/a  Past Medical History:  Diagnosis Date  . Anemia    in the past   . [place]    a. diagnosed in 02/2018.  . Bronchitis    easily  develops bronchitis when she has a cold  . Cancer ([facility]    L breast - [place], Feb. 4th   . Cellulitis   . Diabetes mellitus without complication ([facility]   . Dysrhythmia    afib, treating with Eliquis  . Heart murmur   . Hyperlipidemia   . Hypertension    Past Surgical History:  Procedure Laterality Date  . DILATION AND CURETTAGE OF UTERUS     x2 on same day  . PORTACATH PLACEMENT N/A 08/01/2018   Procedure: INSERTION PORT-A-CATH WITH ULTRASOUND;  Surgeon: CErroll Luna MD;  Location: [facility];  Service: General;  Laterality: N/A;  . TONSILLECTOMY AND ADENOIDECTOMY       No outpatient medications have been marked as taking for the 09/05/18 encounter (Appointment) with Arnoldo Lenis, MD.     Allergies:   Levaquin [levofloxacin] and Codeine   Social History   Tobacco Use  . Smoking status: Never Smoker  . Smokeless tobacco: Never Used  Substance Use Topics  . Alcohol use: Not Currently  . Drug use: Never     Family Hx: The patient's family history includes CAD in her brother; Hyperlipidemia in her mother; Hypertension in her brother and mother.  ROS:   Please see the history of present illness.    All  other systems reviewed and are negative.   Labs/Other Tests and Data Reviewed:    Recent Labs: 02/14/2018: B Natriuretic Peptide 406.0; TSH 1.862 02/15/2018: Magnesium 1.7 08/30/2018: ALT 15; BUN 98; Creatinine 1.72; Hemoglobin 9.9; Platelet Count 331; Potassium 4.3; Sodium 132   Recent Lipid Panel No results found for: CHOL, TRIG, HDL, CHOLHDL, LDLCALC, LDLDIRECT  Wt Readings from Last 3 Encounters:  08/30/18 268 lb 12.8 oz (121.9 kg)  08/22/18 270 lb 3.2 oz (122.6 kg)  08/16/18 281 lb 4 oz (127.6 kg)     Objective:    Vital Signs:  There were no vitals taken for this visit.   Normal affect, normal speech patterns. Sounds comfortable, no acute distress, pleasant  ASSESSMENT & PLAN:    1. Afib - no significant symptoms, continue current meds including naticoag  2. Pulmonary HTN - needs further work with sleep study and PFTs.Due to COVID-19 hold off on further testing at this time.   3. HTN - with recent AKI due to dehdyration from chemo induced diarrhea, she will hold prinzide at this time   4. Breast cancer - onc has asked for echos every 3 months, we will work to arrange at her f/u around May or June     COVID-19 Education: The signs and symptoms of COVID-19 were discussed with the patient and how to seek care for testing (follow up with PCP or arrange E-visit).  The importance of social distancing was discussed today.  Patient Risk:   After full review of this patient's clinical status, I feel that they are at least moderate risk at this time.  Time:   Today, I have spent 15 minutes with the patient with telehealth technology discussing.     Medication Adjustments/Labs and Tests Ordered: Current medicines are reviewed at length with the patient today.  Concerns regarding medicines are outlined above.  Tests Ordered: No orders of the defined types were placed in this encounter.  Medication Changes: No orders of the defined types were placed in this  encounter.   Disposition:  Follow up 2  months  Signed, Carlyle Dolly, MD  09/05/2018 9:32 AM    [facility]

## 2018-09-05 NOTE — Progress Notes (Signed)
Medication Instructions:  STOP PRONZIDE  Labwork: NONE Testing/Procedures: NONE  Follow-Up: Your physician recommends that you schedule a follow-up appointment in: 2 MONTHS    Any Other Special Instructions Will Be Listed Below (If Applicable).     If you need a refill on your cardiac medications before your next appointment, please call your pharmacy.

## 2018-09-06 ENCOUNTER — Other Ambulatory Visit: Payer: Medicare Other

## 2018-09-06 ENCOUNTER — Inpatient Hospital Stay: Payer: Medicare Other | Attending: Hematology and Oncology | Admitting: Hematology and Oncology

## 2018-09-06 ENCOUNTER — Inpatient Hospital Stay: Payer: Medicare Other

## 2018-09-06 ENCOUNTER — Ambulatory Visit: Payer: Medicare Other

## 2018-09-06 ENCOUNTER — Other Ambulatory Visit: Payer: Self-pay

## 2018-09-06 VITALS — BP 127/54 | HR 79 | Temp 97.6°F | Resp 18

## 2018-09-06 DIAGNOSIS — I4891 Unspecified atrial fibrillation: Secondary | ICD-10-CM | POA: Insufficient documentation

## 2018-09-06 DIAGNOSIS — C50412 Malignant neoplasm of upper-outer quadrant of left female breast: Secondary | ICD-10-CM

## 2018-09-06 DIAGNOSIS — R739 Hyperglycemia, unspecified: Secondary | ICD-10-CM | POA: Diagnosis not present

## 2018-09-06 DIAGNOSIS — Z7982 Long term (current) use of aspirin: Secondary | ICD-10-CM | POA: Diagnosis not present

## 2018-09-06 DIAGNOSIS — Z170 Estrogen receptor positive status [ER+]: Principal | ICD-10-CM

## 2018-09-06 DIAGNOSIS — Z794 Long term (current) use of insulin: Secondary | ICD-10-CM | POA: Insufficient documentation

## 2018-09-06 DIAGNOSIS — Z95828 Presence of other vascular implants and grafts: Secondary | ICD-10-CM

## 2018-09-06 DIAGNOSIS — E860 Dehydration: Secondary | ICD-10-CM | POA: Diagnosis not present

## 2018-09-06 DIAGNOSIS — R197 Diarrhea, unspecified: Secondary | ICD-10-CM

## 2018-09-06 DIAGNOSIS — I10 Essential (primary) hypertension: Secondary | ICD-10-CM | POA: Diagnosis not present

## 2018-09-06 DIAGNOSIS — Z5112 Encounter for antineoplastic immunotherapy: Secondary | ICD-10-CM | POA: Diagnosis not present

## 2018-09-06 DIAGNOSIS — E785 Hyperlipidemia, unspecified: Secondary | ICD-10-CM | POA: Insufficient documentation

## 2018-09-06 DIAGNOSIS — D509 Iron deficiency anemia, unspecified: Secondary | ICD-10-CM | POA: Diagnosis not present

## 2018-09-06 DIAGNOSIS — Z7951 Long term (current) use of inhaled steroids: Secondary | ICD-10-CM | POA: Diagnosis not present

## 2018-09-06 DIAGNOSIS — Z79899 Other long term (current) drug therapy: Secondary | ICD-10-CM | POA: Diagnosis not present

## 2018-09-06 DIAGNOSIS — E1165 Type 2 diabetes mellitus with hyperglycemia: Secondary | ICD-10-CM | POA: Diagnosis not present

## 2018-09-06 DIAGNOSIS — Z7901 Long term (current) use of anticoagulants: Secondary | ICD-10-CM | POA: Diagnosis not present

## 2018-09-06 DIAGNOSIS — Z5111 Encounter for antineoplastic chemotherapy: Secondary | ICD-10-CM | POA: Diagnosis not present

## 2018-09-06 DIAGNOSIS — D649 Anemia, unspecified: Secondary | ICD-10-CM | POA: Insufficient documentation

## 2018-09-06 LAB — CBC WITH DIFFERENTIAL (CANCER CENTER ONLY)
Abs Immature Granulocytes: 0.08 10*3/uL — ABNORMAL HIGH (ref 0.00–0.07)
Basophils Absolute: 0 10*3/uL (ref 0.0–0.1)
Basophils Relative: 0 %
Eosinophils Absolute: 0.1 10*3/uL (ref 0.0–0.5)
Eosinophils Relative: 1 %
HCT: 29.8 % — ABNORMAL LOW (ref 36.0–46.0)
Hemoglobin: 9.8 g/dL — ABNORMAL LOW (ref 12.0–15.0)
Immature Granulocytes: 1 %
Lymphocytes Relative: 9 %
Lymphs Abs: 1.1 10*3/uL (ref 0.7–4.0)
MCH: 25.9 pg — ABNORMAL LOW (ref 26.0–34.0)
MCHC: 32.9 g/dL (ref 30.0–36.0)
MCV: 78.8 fL — ABNORMAL LOW (ref 80.0–100.0)
Monocytes Absolute: 1 10*3/uL (ref 0.1–1.0)
Monocytes Relative: 7 %
Neutro Abs: 11 10*3/uL — ABNORMAL HIGH (ref 1.7–7.7)
Neutrophils Relative %: 82 %
Platelet Count: 269 10*3/uL (ref 150–400)
RBC: 3.78 MIL/uL — ABNORMAL LOW (ref 3.87–5.11)
RDW: 17.8 % — ABNORMAL HIGH (ref 11.5–15.5)
WBC Count: 13.3 10*3/uL — ABNORMAL HIGH (ref 4.0–10.5)
nRBC: 0 % (ref 0.0–0.2)

## 2018-09-06 LAB — CMP (CANCER CENTER ONLY)
ALT: 16 U/L (ref 0–44)
AST: 15 U/L (ref 15–41)
Albumin: 3.5 g/dL (ref 3.5–5.0)
Alkaline Phosphatase: 74 U/L (ref 38–126)
Anion gap: 12 (ref 5–15)
BUN: 134 mg/dL — ABNORMAL HIGH (ref 8–23)
CO2: 11 mmol/L — ABNORMAL LOW (ref 22–32)
Calcium: 8.5 mg/dL — ABNORMAL LOW (ref 8.9–10.3)
Chloride: 110 mmol/L (ref 98–111)
Creatinine: 2.05 mg/dL — ABNORMAL HIGH (ref 0.44–1.00)
GFR, Est AFR Am: 28 mL/min — ABNORMAL LOW (ref >60)
GFR, Estimated: 24 mL/min — ABNORMAL LOW (ref >60)
Glucose, Bld: 127 mg/dL — ABNORMAL HIGH (ref 70–99)
Potassium: 3.7 mmol/L (ref 3.5–5.1)
Sodium: 133 mmol/L — ABNORMAL LOW (ref 135–145)
Total Bilirubin: 0.3 mg/dL (ref 0.3–1.2)
Total Protein: 6.7 g/dL (ref 6.5–8.1)

## 2018-09-06 MED ORDER — DIPHENOXYLATE-ATROPINE 2.5-0.025 MG PO TABS: 1.0000 | ORAL_TABLET | Freq: Four times a day (QID) | ORAL | 3 refills | Status: DC | PRN

## 2018-09-06 MED ORDER — SODIUM CHLORIDE 0.9% FLUSH
10.0000 mL | Freq: Once | INTRAVENOUS | Status: AC
  Administered 2018-09-06: 10 mL
  Filled 2018-09-06: qty 10

## 2018-09-06 MED ORDER — CHOLESTYRAMINE 4 G PO PACK: 4.0000 g | PACK | Freq: Three times a day (TID) | ORAL | 12 refills | Status: DC

## 2018-09-06 MED ORDER — PANTOPRAZOLE SODIUM 40 MG PO TBEC: 40.0000 mg | DELAYED_RELEASE_TABLET | Freq: Every day | ORAL | 3 refills | Status: DC

## 2018-09-06 MED ORDER — SODIUM CHLORIDE 0.9 % IV SOLN
510.0000 mg | Freq: Once | INTRAVENOUS | Status: AC
  Administered 2018-09-06: 510 mg via INTRAVENOUS
  Filled 2018-09-06: qty 17

## 2018-09-06 MED ORDER — SODIUM CHLORIDE 0.9 % IV SOLN
Freq: Once | INTRAVENOUS | Status: AC
  Administered 2018-09-06: 13:00:00 via INTRAVENOUS
  Filled 2018-09-06: qty 250

## 2018-09-06 MED ORDER — SODIUM CHLORIDE 0.9 % IV SOLN
Freq: Once | INTRAVENOUS | Status: DC
  Filled 2018-09-06: qty 250

## 2018-09-06 MED ORDER — SODIUM CHLORIDE 0.9% FLUSH
10.0000 mL | Freq: Once | INTRAVENOUS | Status: AC
  Administered 2018-09-06: 12:00:00 10 mL
  Filled 2018-09-06: qty 10

## 2018-09-06 MED ORDER — HEPARIN SOD (PORK) LOCK FLUSH 100 UNIT/ML IV SOLN
500.0000 [IU] | Freq: Once | INTRAVENOUS | Status: AC
  Administered 2018-09-06: 500 [IU]
  Filled 2018-09-06: qty 5

## 2018-09-06 NOTE — Assessment & Plan Note (Signed)
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 Neoadjuvant chemotherapy withTaxol weekly x12 Herceptin Perjeta every 3 weeksfollowed by Herceptinand Perjeta versus Kadcylamaintenance for 1 year 2. Followed by breast conserving surgery if possible with sentinel lymph node study 3. Followed by adjuvant radiation therapy if patient had lumpectomy 4.Followed by adjuvant antiestrogen therapy ------------------------------------------------------------------------------------------------------------------------------------------------------------------- Current treatment: Cycle5day 1 Taxol weekly with Herceptin and Perjeta every 3 weeks Echocardiogram 07/30/2018: EF 60 to 65%  Chemo toxicities: 1.Elevated blood sugars: Patient will discuss with her primary care physician. 2.Diarrhea  up to 10 times per day: I instructed her to schedule Lomotil 3 times a day and take Imodium as needed.  If necessary we will add Questran. 3.  Dehydration: We will give her 500 mL of normal saline today. 4.Normocytic anemia: Iron studies revealed iron saturation of 5%, R74 and folic acid were normal ferritin was 125.  Plan to give IV iron second dose today. We will request genetic consultation  Return to clinic weekly for chemoand every other week for follow-up with me.

## 2018-09-06 NOTE — Patient Instructions (Signed)
Ferumoxytol injection What is this medicine? FERUMOXYTOL is an iron complex. Iron is used to make healthy red blood cells, which carry oxygen and nutrients throughout the body. This medicine is used to treat iron deficiency anemia. This medicine may be used for other purposes; ask your health care provider or pharmacist if you have questions. COMMON BRAND NAME(S): Feraheme What should I tell my health care provider before I take this medicine? They need to know if you have any of these conditions: -anemia not caused by low iron levels -high levels of iron in the blood -magnetic resonance imaging (MRI) test scheduled -an unusual or allergic reaction to iron, other medicines, foods, dyes, or preservatives -pregnant or trying to get pregnant -breast-feeding How should I use this medicine? This medicine is for injection into a vein. It is given by a health care professional in a hospital or clinic setting. Talk to your pediatrician regarding the use of this medicine in children. Special care may be needed. Overdosage: If you think you have taken too much of this medicine contact a poison control center or emergency room at once. NOTE: This medicine is only for you. Do not share this medicine with others. What if I miss a dose? It is important not to miss your dose. Call your doctor or health care professional if you are unable to keep an appointment. What may interact with this medicine? This medicine may interact with the following medications: -other iron products This list may not describe all possible interactions. Give your health care provider a list of all the medicines, herbs, non-prescription drugs, or dietary supplements you use. Also tell them if you smoke, drink alcohol, or use illegal drugs. Some items may interact with your medicine. What should I watch for while using this medicine? Visit your doctor or healthcare professional regularly. Tell your doctor or healthcare professional  if your symptoms do not start to get better or if they get worse. You may need blood work done while you are taking this medicine. You may need to follow a special diet. Talk to your doctor. Foods that contain iron include: whole grains/cereals, dried fruits, beans, or peas, leafy green vegetables, and organ meats (liver, kidney). What side effects may I notice from receiving this medicine? Side effects that you should report to your doctor or health care professional as soon as possible: -allergic reactions like skin rash, itching or hives, swelling of the face, lips, or tongue -breathing problems -changes in blood pressure -feeling faint or lightheaded, falls -fever or chills -flushing, sweating, or hot feelings -swelling of the ankles or feet Side effects that usually do not require medical attention (report to your doctor or health care professional if they continue or are bothersome): -diarrhea -headache -nausea, vomiting -stomach pain This list may not describe all possible side effects. Call your doctor for medical advice about side effects. You may report side effects to FDA at 1-800-FDA-1088. Where should I keep my medicine? This drug is given in a hospital or clinic and will not be stored at home. NOTE: This sheet is a summary. It may not cover all possible information. If you have questions about this medicine, talk to your doctor, pharmacist, or health care provider.  2019 Elsevier/Gold Standard (2016-07-10 20:21:10)  Rehydration, Adult Rehydration is the replacement of body fluids and salts and minerals (electrolytes) that are lost during dehydration. Dehydration is when there is not enough fluid or water in the body. This happens when you lose more fluids than you  take in. Common causes of dehydration include:  Vomiting.  Diarrhea.  Excessive sweating, such as from heat exposure or exercise.  Taking medicines that cause the body to lose excess fluid (diuretics).   Impaired kidney function.  Not drinking enough fluid.  Certain illnesses or infections.  Certain poorly controlled long-term (chronic) illnesses, such as diabetes, heart disease, and kidney disease.  Symptoms of mild dehydration may include thirst, dry lips and mouth, dry skin, and dizziness. Symptoms of severe dehydration may include increased heart rate, confusion, fainting, and not urinating. You can rehydrate by drinking certain fluids or getting fluids through an IV tube, as told by your health care provider. What are the risks? Generally, rehydration is safe. However, one problem that can happen is taking in too much fluid (overhydration). This is rare. If overhydration happens, it can cause an electrolyte imbalance, kidney failure, or a decrease in salt (sodium) levels in the body. How to rehydrate Follow instructions from your health care provider for rehydration. The kind of fluid you should drink and the amount you should drink depend on your condition.  If directed by your health care provider, drink an oral rehydration solution (ORS). This is a drink designed to treat dehydration that is found in pharmacies and retail stores. ? Make an ORS by following instructions on the package. ? Start by drinking small amounts, about  cup (120 mL) every 5-10 minutes. ? Slowly increase how much you drink until you have taken the amount recommended by your health care provider.  Drink enough clear fluids to keep your urine clear or pale yellow. If you were instructed to drink an ORS, finish the ORS first, then start slowly drinking other clear fluids. Drink fluids such as: ? Water. Do not drink only water. Doing that can lead to having too little sodium in your body (hyponatremia). ? Ice chips. ? Fruit juice that you have added water to (diluted juice). ? Low-calorie sports drinks.  If you are severely dehydrated, your health care provider may recommend that you receive fluids through an IV  tube in the hospital.  Do not take sodium tablets. Doing that can lead to the condition of having too much sodium in your body (hypernatremia). Eating while you rehydrate Follow instructions from your health care provider about what to eat while you rehydrate. Your health care provider may recommend that you slowly begin eating regular foods in small amounts.  Eat foods that contain a healthy balance of electrolytes, such as bananas, oranges, potatoes, tomatoes, and spinach.  Avoid foods that are greasy or contain a lot of fat or sugar.  In some cases, you may get nutrition through a feeding tube that is passed through your nose and into your stomach (nasogastric tube, or NG tube). This may be done if you have uncontrolled vomiting or diarrhea. Beverages to avoid Certain beverages may make dehydration worse. While you rehydrate, avoid:  Alcohol.  Caffeine.  Drinks that contain a lot of sugar. These include: ? High-calorie sports drinks. ? Fruit juice that is not diluted. ? Soda.  Check nutrition labels to see how much sugar or caffeine a beverage contains. Signs of dehydration recovery You may be recovering from dehydration if:  You are urinating more often than before you started rehydrating.  Your urine is clear or pale yellow.  Your energy level improves.  You vomit less frequently.  You have diarrhea less frequently.  Your appetite improves or returns to normal.  You feel less dizzy or less  light-headed.  Your skin tone and color start to look more normal. Contact a health care provider if:  You continue to have symptoms of mild dehydration, such as: ? Thirst. ? Dry lips. ? Slightly dry mouth. ? Dry, warm skin. ? Dizziness.  You continue to vomit or have diarrhea. Get help right away if:  You have symptoms of dehydration that get worse.  You feel: ? Confused. ? Weak. ? Like you are going to faint.  You have not urinated in 6-8 hours.  You have very  dark urine.  You have trouble breathing.  Your heart rate while sitting still is over 100 beats a minute.  You cannot drink fluids without vomiting.  You have vomiting or diarrhea that: ? Gets worse. ? Does not go away.  You have a fever. This information is not intended to replace advice given to you by your health care provider. Make sure you discuss any questions you have with your health care provider. Document Released: 08/14/2011 Document Revised: 12/10/2015 Document Reviewed: 07/16/2015 Elsevier Interactive Patient Education  2019 [employer].

## 2018-09-10 ENCOUNTER — Telehealth: Payer: Self-pay | Admitting: Hematology and Oncology

## 2018-09-10 NOTE — Telephone Encounter (Signed)
Left message on both home/cell re 4/9 appointment.

## 2018-09-10 NOTE — Assessment & Plan Note (Signed)
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 Neoadjuvant chemotherapy withTaxol weekly x12 Herceptin Perjeta every 3 weeksfollowed by Herceptinand Perjeta versus Kadcylamaintenance for 1 year 2. Followed by breast conserving surgery if possible with sentinel lymph node study 3. Followed by adjuvant radiation therapy if patient had lumpectomy 4.Followed by adjuvant antiestrogen therapy ------------------------------------------------------------------------------------------------------------------------------------------------------------------- Current treatment: Cycle5day 1 Taxol weekly with Herceptin and Perjeta every 3 weeks Echocardiogram 07/30/2018: EF 60 to 65%  Chemo toxicities: 1.Elevated blood sugars: Patient will discuss with her primary care physician. 2.Diarrhea  up to 10 times per day: I instructed her to schedule Lomotil 3 times a day and take Imodium as needed.  If necessary we will add Questran. 3.  Dehydration: We will give her 500 mL of normal saline today. 4.Normocytic anemia: Iron studies revealed iron saturation of 5%, B12 and folic acid were normal ferritin was 125.  Plan to give IV iron second dose today. We will request genetic consultation  Return to clinic weekly for chemoand every other week for follow-up with me.  

## 2018-09-12 ENCOUNTER — Inpatient Hospital Stay (HOSPITAL_BASED_OUTPATIENT_CLINIC_OR_DEPARTMENT_OTHER): Payer: Medicare Other | Admitting: Hematology and Oncology

## 2018-09-12 ENCOUNTER — Inpatient Hospital Stay: Payer: Medicare Other

## 2018-09-12 ENCOUNTER — Other Ambulatory Visit: Payer: Self-pay

## 2018-09-12 DIAGNOSIS — Z5111 Encounter for antineoplastic chemotherapy: Secondary | ICD-10-CM | POA: Diagnosis not present

## 2018-09-12 DIAGNOSIS — E860 Dehydration: Secondary | ICD-10-CM | POA: Diagnosis not present

## 2018-09-12 DIAGNOSIS — Z170 Estrogen receptor positive status [ER+]: Principal | ICD-10-CM

## 2018-09-12 DIAGNOSIS — Z5112 Encounter for antineoplastic immunotherapy: Secondary | ICD-10-CM | POA: Diagnosis not present

## 2018-09-12 DIAGNOSIS — C50412 Malignant neoplasm of upper-outer quadrant of left female breast: Secondary | ICD-10-CM

## 2018-09-12 DIAGNOSIS — R739 Hyperglycemia, unspecified: Secondary | ICD-10-CM

## 2018-09-12 DIAGNOSIS — D649 Anemia, unspecified: Secondary | ICD-10-CM | POA: Diagnosis not present

## 2018-09-12 DIAGNOSIS — D509 Iron deficiency anemia, unspecified: Secondary | ICD-10-CM

## 2018-09-12 LAB — CBC WITH DIFFERENTIAL (CANCER CENTER ONLY)
Abs Immature Granulocytes: 0.14 10*3/uL — ABNORMAL HIGH (ref 0.00–0.07)
Basophils Absolute: 0 10*3/uL (ref 0.0–0.1)
Basophils Relative: 0 %
Eosinophils Absolute: 0.2 10*3/uL (ref 0.0–0.5)
Eosinophils Relative: 1 %
HCT: 31 % — ABNORMAL LOW (ref 36.0–46.0)
Hemoglobin: 10.4 g/dL — ABNORMAL LOW (ref 12.0–15.0)
Immature Granulocytes: 1 %
Lymphocytes Relative: 8 %
Lymphs Abs: 1.4 10*3/uL (ref 0.7–4.0)
MCH: 26.9 pg (ref 26.0–34.0)
MCHC: 33.5 g/dL (ref 30.0–36.0)
MCV: 80.1 fL (ref 80.0–100.0)
Monocytes Absolute: 1 10*3/uL (ref 0.1–1.0)
Monocytes Relative: 6 %
Neutro Abs: 13.7 10*3/uL — ABNORMAL HIGH (ref 1.7–7.7)
Neutrophils Relative %: 84 %
Platelet Count: 206 10*3/uL (ref 150–400)
RBC: 3.87 MIL/uL (ref 3.87–5.11)
RDW: 18.9 % — ABNORMAL HIGH (ref 11.5–15.5)
WBC Count: 16.4 10*3/uL — ABNORMAL HIGH (ref 4.0–10.5)
nRBC: 0 % (ref 0.0–0.2)

## 2018-09-12 LAB — CMP (CANCER CENTER ONLY)
ALT: 13 U/L (ref 0–44)
AST: 15 U/L (ref 15–41)
Albumin: 3.4 g/dL — ABNORMAL LOW (ref 3.5–5.0)
Alkaline Phosphatase: 71 U/L (ref 38–126)
Anion gap: 8 (ref 5–15)
BUN: 31 mg/dL — ABNORMAL HIGH (ref 8–23)
CO2: 17 mmol/L — ABNORMAL LOW (ref 22–32)
Calcium: 8.5 mg/dL — ABNORMAL LOW (ref 8.9–10.3)
Chloride: 110 mmol/L (ref 98–111)
Creatinine: 1.04 mg/dL — ABNORMAL HIGH (ref 0.44–1.00)
GFR, Est AFR Am: >60 mL/min (ref >60)
GFR, Estimated: 54 mL/min — ABNORMAL LOW (ref >60)
Glucose, Bld: 55 mg/dL — ABNORMAL LOW (ref 70–99)
Potassium: 3.5 mmol/L (ref 3.5–5.1)
Sodium: 135 mmol/L (ref 135–145)
Total Bilirubin: 0.3 mg/dL (ref 0.3–1.2)
Total Protein: 6.6 g/dL (ref 6.5–8.1)

## 2018-09-12 MED ORDER — DIPHENHYDRAMINE HCL 50 MG/ML IJ SOLN
INTRAMUSCULAR | Status: AC
  Filled 2018-09-12: qty 1

## 2018-09-12 MED ORDER — SODIUM CHLORIDE 0.9 % IV SOLN
50.0000 mg/m2 | Freq: Once | INTRAVENOUS | Status: AC
  Administered 2018-09-12: 15:00:00 126 mg via INTRAVENOUS
  Filled 2018-09-12: qty 21

## 2018-09-12 MED ORDER — DIPHENHYDRAMINE HCL 50 MG/ML IJ SOLN
25.0000 mg | Freq: Once | INTRAMUSCULAR | Status: AC
  Administered 2018-09-12: 25 mg via INTRAVENOUS

## 2018-09-12 MED ORDER — HEPARIN SOD (PORK) LOCK FLUSH 100 UNIT/ML IV SOLN
500.0000 [IU] | Freq: Once | INTRAVENOUS | Status: AC | PRN
  Administered 2018-09-12: 500 [IU]
  Filled 2018-09-12: qty 5

## 2018-09-12 MED ORDER — SODIUM CHLORIDE 0.9 % IV SOLN
Freq: Once | INTRAVENOUS | Status: AC
  Administered 2018-09-12: 13:00:00 via INTRAVENOUS
  Filled 2018-09-12: qty 250

## 2018-09-12 MED ORDER — SODIUM CHLORIDE 0.9% FLUSH
10.0000 mL | INTRAVENOUS | Status: DC | PRN
  Administered 2018-09-12: 10 mL
  Filled 2018-09-12: qty 10

## 2018-09-12 MED ORDER — FAMOTIDINE IN NACL 20-0.9 MG/50ML-% IV SOLN: 20.0000 mg | Freq: Once | INTRAVENOUS | Status: DC

## 2018-09-12 MED ORDER — SODIUM CHLORIDE 0.9 % IV SOLN
20.0000 mg | Freq: Once | INTRAVENOUS | Status: AC
  Administered 2018-09-12: 20 mg via INTRAVENOUS
  Filled 2018-09-12: qty 2

## 2018-09-12 NOTE — Patient Instructions (Signed)
Coronavirus (COVID-19) Are you at risk?  Are you at risk for the Coronavirus (COVID-19)?  To be considered HIGH RISK for Coronavirus (COVID-19), you have to meet the following criteria:  . Traveled to [place], [place], [place], [place] or [place]; or in the [place] to [place], [place], [place], or [place]; and have fever, cough, and shortness of breath within the last 2 weeks of travel OR . Been in close contact with a person diagnosed with COVID-19 within the last 2 weeks and have fever, cough, and shortness of breath . IF YOU DO NOT MEET THESE CRITERIA, YOU ARE CONSIDERED LOW RISK FOR COVID-19.  What to do if you are HIGH RISK for COVID-19?  Marland Kitchen If you are having a medical emergency, call 911. . Seek medical care right away. Before you go to a doctor's office, urgent care or emergency department, call ahead and tell them about your recent travel, contact with someone diagnosed with COVID-19, and your symptoms. You should receive instructions from your physician's office regarding next steps of care.  . When you arrive at healthcare provider, tell the healthcare staff immediately you have returned from visiting [place], [place], [place], [place] or [place]; or traveled in the [place] to [place], [place], [place], or [place]; in the last two weeks or you have been in close contact with a person diagnosed with COVID-19 in the last 2 weeks.   . Tell the health care staff about your symptoms: fever, cough and shortness of breath. . After you have been seen by a medical provider, you will be either: o Tested for (COVID-19) and discharged home on quarantine except to seek medical care if symptoms worsen, and asked to  - Stay home and avoid contact with others until you get your results (4-5 days)  - Avoid travel on public transportation if possible (such as bus, train, or airplane) or o Sent to the Emergency Department by EMS for evaluation, COVID-19 testing, and possible  admission depending on your condition and test results.  What to do if you are LOW RISK for COVID-19?  Reduce your risk of any infection by using the same precautions used for avoiding the common cold or flu:  Marland Kitchen Wash your hands often with soap and warm water for at least 20 seconds.  If soap and water are not readily available, use an alcohol-based hand sanitizer with at least 60% alcohol.  . If coughing or sneezing, cover your mouth and nose by coughing or sneezing into the elbow areas of your shirt or coat, into a tissue or into your sleeve (not your hands). . Avoid shaking hands with others and consider head nods or verbal greetings only. . Avoid touching your eyes, nose, or mouth with unwashed hands.  . Avoid close contact with people who are sick. . Avoid places or events with large numbers of people in one location, like concerts or sporting events. . Carefully consider travel plans you have or are making. . If you are planning any travel outside or inside the [place], visit the CDC's Travelers' Health webpage for the latest health notices. . If you have some symptoms but not all symptoms, continue to monitor at home and seek medical attention if your symptoms worsen. . If you are having a medical emergency, call 911.   [place] / e-Visit: [email or web address]         [facility]: [place]  [facility]: [facility]: [place] Discharge Instructions for Patients Receiving Chemotherapy  Today you received the following chemotherapy agents: Paclitaxel (Taxol)  To help prevent nausea and vomiting after your treatment, we encourage you to take your nausea medication as directed.    If you develop nausea and vomiting that is not controlled by your nausea medication, call the clinic.    BELOW ARE SYMPTOMS THAT SHOULD BE REPORTED IMMEDIATELY:  *FEVER GREATER THAN 100.5 F  *CHILLS WITH OR WITHOUT FEVER  NAUSEA AND VOMITING THAT IS NOT CONTROLLED WITH YOUR NAUSEA MEDICATION  *UNUSUAL SHORTNESS OF BREATH  *UNUSUAL BRUISING OR BLEEDING  TENDERNESS IN MOUTH AND THROAT WITH OR WITHOUT PRESENCE OF ULCERS  *URINARY PROBLEMS  *BOWEL PROBLEMS  UNUSUAL RASH Items with * indicate a potential emergency and should be followed up as soon as possible.  Feel free to call the clinic should you have any questions or concerns. The clinic phone number is [phone].  Please show the [facility] at check-in to the Emergency Department and triage nurse.

## 2018-09-12 NOTE — Progress Notes (Signed)
Spoke w/ Dr. Lindi Adie - pt missed ~2 weeks of treatment, so her Herceptin treatment dates got a bit off. Plan to give paclitaxel alone today. She will get Herceptin and paclitaxel next week. No reload on herceptin needed next week.   Demetrius Charity, PharmD, [facility] Pharmacist Pharmacy Phone: [phone] 09/12/2018

## 2018-09-12 NOTE — Addendum Note (Signed)
Addended by: Jalene Mullet on: 09/12/2018 01:14 PM   Modules accepted: Orders

## 2018-09-12 NOTE — Progress Notes (Signed)
Patient Care Team: [name], MD as PCP - General (Family Medicine) Harl Bowie Alphonse Guild, MD as PCP - Cardiology (Cardiology)  DIAGNOSIS:  Encounter Diagnosis  Name Primary?  . Malignant neoplasm of upper-outer quadrant of left breast in female, estrogen receptor positive ([place])     SUMMARY OF ONCOLOGIC HISTORY:   Malignant neoplasm of upper-outer quadrant of left breast in female, estrogen receptor positive ([place])   07/09/2018 Initial Diagnosis    Screening mammogram detected spiculated mass in the left breast upper outer quadrant, 2.6 cm by MRI, axilla negative, biopsy revealed grade 2 IDC with DCIS, ER 100%, PR 70%, Ki-67 10%, HER-2 3+ positive, T2N0 stage Ib clinical stage    07/25/2018 Cancer Staging    Staging form: Breast, AJCC 8th Edition - Clinical stage from 07/25/2018: Stage IB (cT2, cN0, cM0, G2, ER+, PR+, HER2+) - Signed by Nicholas Lose, MD on 07/25/2018    08/02/2018 -  Neo-Adjuvant Chemotherapy    Neoadjuvant chemotherapy with Taxol weekly with Herceptin and Perjeta every 3 weeks     CHIEF COMPLIANT: Cycle 5 of Taxol along with Herceptin, Perjeta discontinued  INTERVAL HISTORY: Destiny Nash is a 73 year old with above-mentioned history of left breast cancer who is here for neoadjuvant chemotherapy with Taxol Herceptin and Perjeta. She continues to have intermittent diarrhea for which she is taking antidiarrheal medications which appear to be helping.  Today she feels much better.  So she is willing to proceed with chemo.  Perjeta has been discontinued. Energy levels are still very poor.  She needs help with all activities.  REVIEW OF SYSTEMS:   Constitutional: Denies fevers, chills or abnormal weight loss Eyes: Denies blurriness of vision Ears, nose, mouth, throat, and face: Denies mucositis or sore throat Respiratory: Denies cough, dyspnea or wheezes Cardiovascular: Denies palpitation, chest discomfort Gastrointestinal:  Denies nausea, heartburn or change in  bowel habits Skin: Denies abnormal skin rashes Lymphatics: Denies new lymphadenopathy or easy bruising Neurological: Generalized weakness wheelchair for ambulation Behavioral/Psych: Mood is stable, no new changes  Extremities: No lower extremity edema   All other systems were reviewed with the patient and are negative.  I have reviewed the past medical history, past surgical history, social history and family history with the patient and they are unchanged from previous note.  ALLERGIES:  is allergic to levaquin [levofloxacin] and codeine.  MEDICATIONS:  Current Outpatient Medications  Medication Sig Dispense Refill  . acetaminophen (TYLENOL) 500 MG tablet Take 1,000 mg by mouth every 6 (six) hours as needed for moderate pain or headache.    . albuterol (PROVENTIL HFA;VENTOLIN HFA) 108 (90 Base) MCG/ACT inhaler Inhale 2 puffs into the lungs every 6 (six) hours as needed for wheezing or shortness of breath.    Marland Kitchen apixaban (ELIQUIS) 5 MG TABS tablet Take 1 tablet (5 mg total) by mouth 2 (two) times daily. 60 tablet 0  . aspirin EC 81 MG tablet Take 81 mg by mouth at bedtime.    . cholestyramine (QUESTRAN) 4 g packet Take 1 packet (4 g total) by mouth 3 (three) times daily with meals. 60 each 12  . CINNAMON PO Take 1 tablet by mouth daily.    Marland Kitchen diltiazem (CARDIZEM CD) 300 MG 24 hr capsule Take 1 capsule (300 mg total) by mouth daily. (Patient taking differently: Take 300 mg by mouth daily before breakfast. ) 90 capsule 3  . diphenoxylate-atropine (LOMOTIL) 2.5-0.025 MG tablet Take 1 tablet by mouth 4 (four) times daily as needed for diarrhea or loose  stools. 120 tablet 3  . glyBURIDE (DIABETA) 5 MG tablet Take 10 mg by mouth 2 (two) times daily.  0  . insulin detemir (LEVEMIR) 100 UNIT/ML injection Inject 0.25 mLs (25 Units total) into the skin at bedtime. (Patient taking differently: Inject 14 Units into the skin at bedtime. ) 10 mL 0  . lidocaine-prilocaine (EMLA) cream Apply to affected area  once 30 g 3  . LORazepam (ATIVAN) 0.5 MG tablet Take 1 tablet (0.5 mg total) by mouth at bedtime as needed for sleep. (Patient not taking: Reported on 07/26/2018) 30 tablet 0  . metFORMIN (GLUCOPHAGE) 500 MG tablet Take 1,000 mg by mouth 2 (two) times daily with a meal.     . niacin 500 MG tablet Take 500 mg by mouth daily.    . Omega-3 1000 MG CAPS Take 1,000 mg by mouth 2 (two) times daily.    . ondansetron (ZOFRAN) 8 MG tablet Take 1 tablet (8 mg total) by mouth 2 (two) times daily as needed (Nausea or vomiting). 30 tablet 1  . oxyCODONE (OXY IR/ROXICODONE) 5 MG immediate release tablet Take 1 tablet (5 mg total) by mouth every 6 (six) hours as needed for severe pain. 12 tablet 0  . pantoprazole (PROTONIX) 40 MG tablet Take 1 tablet (40 mg total) by mouth daily. 30 tablet 3  . pravastatin (PRAVACHOL) 20 MG tablet Take 20 mg by mouth daily before breakfast.   0  . prochlorperazine (COMPAZINE) 10 MG tablet Take 1 tablet (10 mg total) by mouth every 6 (six) hours as needed (Nausea or vomiting). 30 tablet 1   No current facility-administered medications for this visit.     PHYSICAL EXAMINATION: ECOG PERFORMANCE STATUS: 2 - Symptomatic, <50% confined to bed  Vitals:   09/12/18 1046  BP: (!) 149/81  Pulse: 72  Resp: 16  Temp: 97.6 F (36.4 C)  SpO2: 100%   Filed Weights   09/12/18 1046  Weight: 260 lb (117.9 kg)    GENERAL:alert, no distress and comfortable SKIN: skin color, texture, turgor are normal, no rashes or significant lesions EYES: normal, Conjunctiva are pink and non-injected, sclera clear OROPHARYNX:no exudate, no erythema and lips, buccal mucosa, and tongue normal  NECK: supple, thyroid normal size, non-tender, without nodularity LYMPH:  no palpable lymphadenopathy in the cervical, axillary or inguinal LUNGS: clear to auscultation and percussion with normal breathing effort HEART: regular rate & rhythm and no murmurs and no lower extremity edema ABDOMEN:abdomen soft,  non-tender and normal bowel sounds MUSCULOSKELETAL:no cyanosis of digits and no clubbing  NEURO: alert & oriented x 3 with fluent speech, no focal motor/sensory deficits EXTREMITIES: No lower extremity edema   LABORATORY DATA:  I have reviewed the data as listed CMP Latest Ref Rng & Units 09/12/2018 09/06/2018 08/30/2018  Glucose 70 - 99 mg/dL 55(L) 127(H) 80  BUN 8 - 23 mg/dL 31(H) 134(H) 98(H)  Creatinine 0.44 - 1.00 mg/dL 1.04(H) 2.05(H) 1.72(H)  Sodium 135 - 145 mmol/L 135 133(L) 132(L)  Potassium 3.5 - 5.1 mmol/L 3.5 3.7 4.3  Chloride 98 - 111 mmol/L 110 110 107  CO2 22 - 32 mmol/L 17(L) 11(L) 13(L)  Calcium 8.9 - 10.3 mg/dL 8.5(L) 8.5(L) 9.8  Total Protein 6.5 - 8.1 g/dL 6.6 6.7 7.1  Total Bilirubin 0.3 - 1.2 mg/dL 0.3 0.3 0.3  Alkaline Phos 38 - 126 U/L 71 74 75  AST 15 - 41 U/L _0 ALT 0 - 44 U/L _1 Lab  Results  Component Value Date   WBC 16.4 (H) 09/12/2018   HGB 10.4 (L) 09/12/2018   HCT 31.0 (L) 09/12/2018   MCV 80.1 09/12/2018   PLT 206 09/12/2018   NEUTROABS 13.7 (H) 09/12/2018    ASSESSMENT & PLAN:  Malignant neoplasm of upper-outer quadrant of left breast in female, estrogen receptor positive ([place]) 07/09/2018:Screening mammogram detected spiculated mass in the left breast upper outer quadrant, 2.6 cm by MRI, axilla negative, 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 Neoadjuvant chemotherapy withTaxol weekly x12 Herceptin Perjeta every 3 weeksfollowed by Herceptinand Perjeta versus Kadcylamaintenance for 1 year 2. Followed by breast conserving surgery if possible with sentinel lymph node study 3. Followed by adjuvant radiation therapy if patient had lumpectomy 4.Followed by adjuvant antiestrogen therapy  ------------------------------------------------------------------------------------------------------------------------------------------------------------------- Current treatment: Cycle5day 1 Taxol weekly with Herceptin and Perjeta every 3 weeks Echocardiogram 07/30/2018: EF 60 to 65%  Chemo toxicities: 1.Elevated blood sugars: Patient will discuss with her primary care physician. 2.Diarrhea  up to 10 times per day: Much improved with Questran and Lomotil  3.  Dehydration: We gave her normal saline fluids last week and she felt better.Marland Kitchen 4.Normocytic anemia: Iron studies revealed iron saturation of 5%, K55 and folic acid were normal ferritin was 125.    She received 2 doses of IV iron and slight improvement in hemoglobin is been noted.   We will request genetic consultation  Return to clinic weekly for chemoand every other week for follow-up with me.  No orders of the defined types were placed in this encounter.  The patient has a good understanding of the overall plan. she agrees with it. she will call with any problems that may develop before the next visit here.   Harriette Ohara, MD 09/12/18

## 2018-09-13 ENCOUNTER — Telehealth: Payer: Self-pay | Admitting: Hematology and Oncology

## 2018-09-13 NOTE — Telephone Encounter (Signed)
Tried to reach regarding 4/17

## 2018-09-20 ENCOUNTER — Inpatient Hospital Stay: Payer: Medicare Other

## 2018-09-20 ENCOUNTER — Other Ambulatory Visit: Payer: Self-pay

## 2018-09-20 VITALS — BP 134/78 | HR 74 | Temp 97.8°F | Resp 16

## 2018-09-20 DIAGNOSIS — Z170 Estrogen receptor positive status [ER+]: Secondary | ICD-10-CM

## 2018-09-20 DIAGNOSIS — D649 Anemia, unspecified: Secondary | ICD-10-CM | POA: Diagnosis not present

## 2018-09-20 DIAGNOSIS — E860 Dehydration: Secondary | ICD-10-CM | POA: Diagnosis not present

## 2018-09-20 DIAGNOSIS — Z5111 Encounter for antineoplastic chemotherapy: Secondary | ICD-10-CM | POA: Diagnosis not present

## 2018-09-20 DIAGNOSIS — C50412 Malignant neoplasm of upper-outer quadrant of left female breast: Secondary | ICD-10-CM

## 2018-09-20 DIAGNOSIS — Z95828 Presence of other vascular implants and grafts: Secondary | ICD-10-CM

## 2018-09-20 DIAGNOSIS — Z5112 Encounter for antineoplastic immunotherapy: Secondary | ICD-10-CM | POA: Diagnosis not present

## 2018-09-20 LAB — CBC WITH DIFFERENTIAL (CANCER CENTER ONLY)
Abs Immature Granulocytes: 0.03 10*3/uL (ref 0.00–0.07)
Basophils Absolute: 0.1 10*3/uL (ref 0.0–0.1)
Basophils Relative: 1 %
Eosinophils Absolute: 0.4 10*3/uL (ref 0.0–0.5)
Eosinophils Relative: 4 %
HCT: 30.6 % — ABNORMAL LOW (ref 36.0–46.0)
Hemoglobin: 9.8 g/dL — ABNORMAL LOW (ref 12.0–15.0)
Immature Granulocytes: 0 %
Lymphocytes Relative: 8 %
Lymphs Abs: 0.9 10*3/uL (ref 0.7–4.0)
MCH: 26.4 pg (ref 26.0–34.0)
MCHC: 32 g/dL (ref 30.0–36.0)
MCV: 82.5 fL (ref 80.0–100.0)
Monocytes Absolute: 0.6 10*3/uL (ref 0.1–1.0)
Monocytes Relative: 6 %
Neutro Abs: 8.3 10*3/uL — ABNORMAL HIGH (ref 1.7–7.7)
Neutrophils Relative %: 81 %
Platelet Count: 184 10*3/uL (ref 150–400)
RBC: 3.71 MIL/uL — ABNORMAL LOW (ref 3.87–5.11)
RDW: 18.6 % — ABNORMAL HIGH (ref 11.5–15.5)
WBC Count: 10.2 10*3/uL (ref 4.0–10.5)
nRBC: 0 % (ref 0.0–0.2)

## 2018-09-20 LAB — CMP (CANCER CENTER ONLY)
ALT: 21 U/L (ref 0–44)
AST: 19 U/L (ref 15–41)
Albumin: 3.2 g/dL — ABNORMAL LOW (ref 3.5–5.0)
Alkaline Phosphatase: 83 U/L (ref 38–126)
Anion gap: 10 (ref 5–15)
BUN: 10 mg/dL (ref 8–23)
CO2: 17 mmol/L — ABNORMAL LOW (ref 22–32)
Calcium: 8.2 mg/dL — ABNORMAL LOW (ref 8.9–10.3)
Chloride: 110 mmol/L (ref 98–111)
Creatinine: 0.78 mg/dL (ref 0.44–1.00)
GFR, Est AFR Am: >60 mL/min (ref >60)
GFR, Estimated: >60 mL/min (ref >60)
Glucose, Bld: 114 mg/dL — ABNORMAL HIGH (ref 70–99)
Potassium: 3.5 mmol/L (ref 3.5–5.1)
Sodium: 137 mmol/L (ref 135–145)
Total Bilirubin: 0.3 mg/dL (ref 0.3–1.2)
Total Protein: 6.3 g/dL — ABNORMAL LOW (ref 6.5–8.1)

## 2018-09-20 MED ORDER — DIPHENHYDRAMINE HCL 50 MG/ML IJ SOLN
25.0000 mg | Freq: Once | INTRAMUSCULAR | Status: AC
  Administered 2018-09-20: 10:00:00 25 mg via INTRAVENOUS

## 2018-09-20 MED ORDER — ACETAMINOPHEN 325 MG PO TABS
650.0000 mg | ORAL_TABLET | Freq: Once | ORAL | Status: AC
  Administered 2018-09-20: 650 mg via ORAL

## 2018-09-20 MED ORDER — DIPHENHYDRAMINE HCL 50 MG/ML IJ SOLN
INTRAMUSCULAR | Status: AC
  Filled 2018-09-20: qty 1

## 2018-09-20 MED ORDER — SODIUM CHLORIDE 0.9% FLUSH
10.0000 mL | Freq: Once | INTRAVENOUS | Status: AC
  Administered 2018-09-20: 10 mL
  Filled 2018-09-20: qty 10

## 2018-09-20 MED ORDER — HEPARIN SOD (PORK) LOCK FLUSH 100 UNIT/ML IV SOLN
500.0000 [IU] | Freq: Once | INTRAVENOUS | Status: AC
  Administered 2018-09-20: 500 [IU]
  Filled 2018-09-20: qty 5

## 2018-09-20 MED ORDER — SODIUM CHLORIDE 0.9 % IV SOLN
20.0000 mg | Freq: Once | INTRAVENOUS | Status: AC
  Administered 2018-09-20: 20 mg via INTRAVENOUS
  Filled 2018-09-20: qty 2

## 2018-09-20 MED ORDER — ACETAMINOPHEN 325 MG PO TABS
ORAL_TABLET | ORAL | Status: AC
  Filled 2018-09-20: qty 2

## 2018-09-20 MED ORDER — SODIUM CHLORIDE 0.9 % IV SOLN
Freq: Once | INTRAVENOUS | Status: AC
  Administered 2018-09-20: 10:00:00 via INTRAVENOUS
  Filled 2018-09-20: qty 250

## 2018-09-20 MED ORDER — SODIUM CHLORIDE 0.9 % IV SOLN
50.0000 mg/m2 | Freq: Once | INTRAVENOUS | Status: AC
  Administered 2018-09-20: 126 mg via INTRAVENOUS
  Filled 2018-09-20: qty 21

## 2018-09-20 MED ORDER — TRASTUZUMAB CHEMO 150 MG IV SOLR
750.0000 mg | Freq: Once | INTRAVENOUS | Status: AC
  Administered 2018-09-20: 750 mg via INTRAVENOUS
  Filled 2018-09-20: qty 35.72

## 2018-09-20 NOTE — Patient Instructions (Signed)
Coronavirus (COVID-19) Are you at risk?  Are you at risk for the Coronavirus (COVID-19)?  To be considered HIGH RISK for Coronavirus (COVID-19), you have to meet the following criteria:  . Traveled to [place], [place], [place], [place] or [place]; or in the [place] to [place], [place], [place], or [place]; and have fever, cough, and shortness of breath within the last 2 weeks of travel OR . Been in close contact with a person diagnosed with COVID-19 within the last 2 weeks and have fever, cough, and shortness of breath . IF YOU DO NOT MEET THESE CRITERIA, YOU ARE CONSIDERED LOW RISK FOR COVID-19.  What to do if you are HIGH RISK for COVID-19?  Marland Kitchen If you are having a medical emergency, call 911. . Seek medical care right away. Before you go to a doctor's office, urgent care or emergency department, call ahead and tell them about your recent travel, contact with someone diagnosed with COVID-19, and your symptoms. You should receive instructions from your physician's office regarding next steps of care.  . When you arrive at healthcare provider, tell the healthcare staff immediately you have returned from visiting [place], [place], [place], [place] or [place]; or traveled in the [place] to [place], [place], [place], or [place]; in the last two weeks or you have been in close contact with a person diagnosed with COVID-19 in the last 2 weeks.   . Tell the health care staff about your symptoms: fever, cough and shortness of breath. . After you have been seen by a medical provider, you will be either: o Tested for (COVID-19) and discharged home on quarantine except to seek medical care if symptoms worsen, and asked to  - Stay home and avoid contact with others until you get your results (4-5 days)  - Avoid travel on public transportation if possible (such as bus, train, or airplane) or o Sent to the Emergency Department by EMS for evaluation, COVID-19 testing, and possible  admission depending on your condition and test results.  What to do if you are LOW RISK for COVID-19?  Reduce your risk of any infection by using the same precautions used for avoiding the common cold or flu:  Marland Kitchen Wash your hands often with soap and warm water for at least 20 seconds.  If soap and water are not readily available, use an alcohol-based hand sanitizer with at least 60% alcohol.  . If coughing or sneezing, cover your mouth and nose by coughing or sneezing into the elbow areas of your shirt or coat, into a tissue or into your sleeve (not your hands). . Avoid shaking hands with others and consider head nods or verbal greetings only. . Avoid touching your eyes, nose, or mouth with unwashed hands.  . Avoid close contact with people who are sick. . Avoid places or events with large numbers of people in one location, like concerts or sporting events. . Carefully consider travel plans you have or are making. . If you are planning any travel outside or inside the [place], visit the CDC's Travelers' Health webpage for the latest health notices. . If you have some symptoms but not all symptoms, continue to monitor at home and seek medical attention if your symptoms worsen. . If you are having a medical emergency, call 911.   [place] / e-Visit: [email or web address]         [facility]: [place]  [facility]: [facility]: [place] Discharge Instructions for Patients Receiving Chemotherapy  Today you received the following chemotherapy agents: Paclitaxel (Taxol), Herceptin  To help prevent nausea and vomiting after your treatment, we encourage you to take your nausea medication as directed.    If you develop nausea and vomiting that is not controlled by your nausea medication, call the  clinic.   BELOW ARE SYMPTOMS THAT SHOULD BE REPORTED IMMEDIATELY:  *FEVER GREATER THAN 100.5 F  *CHILLS WITH OR WITHOUT FEVER  NAUSEA AND VOMITING THAT IS NOT CONTROLLED WITH YOUR NAUSEA MEDICATION  *UNUSUAL SHORTNESS OF BREATH  *UNUSUAL BRUISING OR BLEEDING  TENDERNESS IN MOUTH AND THROAT WITH OR WITHOUT PRESENCE OF ULCERS  *URINARY PROBLEMS  *BOWEL PROBLEMS  UNUSUAL RASH Items with * indicate a potential emergency and should be followed up as soon as possible.  Feel free to call the clinic should you have any questions or concerns. The clinic phone number is [phone].  Please show the [facility] at check-in to the Emergency Department and triage nurse.

## 2018-09-27 ENCOUNTER — Encounter: Payer: Self-pay | Admitting: Adult Health

## 2018-09-27 ENCOUNTER — Inpatient Hospital Stay: Payer: Medicare Other

## 2018-09-27 ENCOUNTER — Inpatient Hospital Stay (HOSPITAL_BASED_OUTPATIENT_CLINIC_OR_DEPARTMENT_OTHER): Payer: Medicare Other | Admitting: Adult Health

## 2018-09-27 ENCOUNTER — Other Ambulatory Visit: Payer: Self-pay

## 2018-09-27 ENCOUNTER — Telehealth: Payer: Self-pay | Admitting: Adult Health

## 2018-09-27 VITALS — BP 149/81 | HR 81 | Temp 98.5°F | Resp 18 | Ht 66.0 in | Wt 273.5 lb

## 2018-09-27 DIAGNOSIS — Z95828 Presence of other vascular implants and grafts: Secondary | ICD-10-CM

## 2018-09-27 DIAGNOSIS — C50412 Malignant neoplasm of upper-outer quadrant of left female breast: Secondary | ICD-10-CM

## 2018-09-27 DIAGNOSIS — Z794 Long term (current) use of insulin: Secondary | ICD-10-CM | POA: Diagnosis not present

## 2018-09-27 DIAGNOSIS — E1165 Type 2 diabetes mellitus with hyperglycemia: Secondary | ICD-10-CM | POA: Diagnosis not present

## 2018-09-27 DIAGNOSIS — D649 Anemia, unspecified: Secondary | ICD-10-CM

## 2018-09-27 DIAGNOSIS — Z7901 Long term (current) use of anticoagulants: Secondary | ICD-10-CM

## 2018-09-27 DIAGNOSIS — I10 Essential (primary) hypertension: Secondary | ICD-10-CM

## 2018-09-27 DIAGNOSIS — Z7951 Long term (current) use of inhaled steroids: Secondary | ICD-10-CM

## 2018-09-27 DIAGNOSIS — Z170 Estrogen receptor positive status [ER+]: Secondary | ICD-10-CM

## 2018-09-27 DIAGNOSIS — I4891 Unspecified atrial fibrillation: Secondary | ICD-10-CM

## 2018-09-27 DIAGNOSIS — E860 Dehydration: Secondary | ICD-10-CM

## 2018-09-27 DIAGNOSIS — Z7982 Long term (current) use of aspirin: Secondary | ICD-10-CM

## 2018-09-27 DIAGNOSIS — E785 Hyperlipidemia, unspecified: Secondary | ICD-10-CM | POA: Diagnosis not present

## 2018-09-27 DIAGNOSIS — Z5112 Encounter for antineoplastic immunotherapy: Secondary | ICD-10-CM | POA: Diagnosis not present

## 2018-09-27 DIAGNOSIS — Z5111 Encounter for antineoplastic chemotherapy: Secondary | ICD-10-CM | POA: Diagnosis not present

## 2018-09-27 DIAGNOSIS — Z79899 Other long term (current) drug therapy: Secondary | ICD-10-CM

## 2018-09-27 LAB — CMP (CANCER CENTER ONLY)
ALT: 36 U/L (ref 0–44)
AST: 26 U/L (ref 15–41)
Albumin: 3 g/dL — ABNORMAL LOW (ref 3.5–5.0)
Alkaline Phosphatase: 93 U/L (ref 38–126)
Anion gap: 10 (ref 5–15)
BUN: 11 mg/dL (ref 8–23)
CO2: 20 mmol/L — ABNORMAL LOW (ref 22–32)
Calcium: 8 mg/dL — ABNORMAL LOW (ref 8.9–10.3)
Chloride: 110 mmol/L (ref 98–111)
Creatinine: 0.76 mg/dL (ref 0.44–1.00)
GFR, Est AFR Am: >60 mL/min (ref >60)
GFR, Estimated: >60 mL/min (ref >60)
Glucose, Bld: 98 mg/dL (ref 70–99)
Potassium: 3.3 mmol/L — ABNORMAL LOW (ref 3.5–5.1)
Sodium: 140 mmol/L (ref 135–145)
Total Bilirubin: 0.2 mg/dL — ABNORMAL LOW (ref 0.3–1.2)
Total Protein: 6.2 g/dL — ABNORMAL LOW (ref 6.5–8.1)

## 2018-09-27 LAB — CBC WITH DIFFERENTIAL (CANCER CENTER ONLY)
Abs Immature Granulocytes: 0.02 10*3/uL (ref 0.00–0.07)
Basophils Absolute: 0 10*3/uL (ref 0.0–0.1)
Basophils Relative: 0 %
Eosinophils Absolute: 0.2 10*3/uL (ref 0.0–0.5)
Eosinophils Relative: 3 %
HCT: 29.5 % — ABNORMAL LOW (ref 36.0–46.0)
Hemoglobin: 9.4 g/dL — ABNORMAL LOW (ref 12.0–15.0)
Immature Granulocytes: 0 %
Lymphocytes Relative: 12 %
Lymphs Abs: 0.7 10*3/uL (ref 0.7–4.0)
MCH: 26.6 pg (ref 26.0–34.0)
MCHC: 31.9 g/dL (ref 30.0–36.0)
MCV: 83.6 fL (ref 80.0–100.0)
Monocytes Absolute: 0.5 10*3/uL (ref 0.1–1.0)
Monocytes Relative: 9 %
Neutro Abs: 4.5 10*3/uL (ref 1.7–7.7)
Neutrophils Relative %: 76 %
Platelet Count: 210 10*3/uL (ref 150–400)
RBC: 3.53 MIL/uL — ABNORMAL LOW (ref 3.87–5.11)
RDW: 18.1 % — ABNORMAL HIGH (ref 11.5–15.5)
WBC Count: 6 10*3/uL (ref 4.0–10.5)
nRBC: 0 % (ref 0.0–0.2)

## 2018-09-27 MED ORDER — FAMOTIDINE IN NACL 20-0.9 MG/50ML-% IV SOLN
INTRAVENOUS | Status: AC
  Filled 2018-09-27: qty 50

## 2018-09-27 MED ORDER — DIPHENHYDRAMINE HCL 50 MG/ML IJ SOLN
INTRAMUSCULAR | Status: AC
  Filled 2018-09-27: qty 1

## 2018-09-27 MED ORDER — SODIUM CHLORIDE 0.9% FLUSH
10.0000 mL | INTRAVENOUS | Status: DC | PRN
  Administered 2018-09-27: 15:00:00 10 mL
  Filled 2018-09-27: qty 10

## 2018-09-27 MED ORDER — HEPARIN SOD (PORK) LOCK FLUSH 100 UNIT/ML IV SOLN
500.0000 [IU] | Freq: Once | INTRAVENOUS | Status: AC | PRN
  Administered 2018-09-27: 15:00:00 500 [IU]
  Filled 2018-09-27: qty 5

## 2018-09-27 MED ORDER — SODIUM CHLORIDE 0.9% FLUSH
10.0000 mL | Freq: Once | INTRAVENOUS | Status: AC
  Administered 2018-09-27: 10 mL
  Filled 2018-09-27: qty 10

## 2018-09-27 MED ORDER — FAMOTIDINE IN NACL 20-0.9 MG/50ML-% IV SOLN
20.0000 mg | Freq: Once | INTRAVENOUS | Status: AC
  Administered 2018-09-27: 20 mg via INTRAVENOUS

## 2018-09-27 MED ORDER — DIPHENHYDRAMINE HCL 50 MG/ML IJ SOLN
25.0000 mg | Freq: Once | INTRAMUSCULAR | Status: AC
  Administered 2018-09-27: 25 mg via INTRAVENOUS

## 2018-09-27 MED ORDER — SODIUM CHLORIDE 0.9 % IV SOLN
Freq: Once | INTRAVENOUS | Status: AC
  Administered 2018-09-27: 12:00:00 via INTRAVENOUS
  Filled 2018-09-27: qty 250

## 2018-09-27 MED ORDER — SODIUM CHLORIDE 0.9 % IV SOLN
50.0000 mg/m2 | Freq: Once | INTRAVENOUS | Status: AC
  Administered 2018-09-27: 13:00:00 126 mg via INTRAVENOUS
  Filled 2018-09-27: qty 21

## 2018-09-27 NOTE — Progress Notes (Signed)
[facility] Follow up:    Destiny Frees, MD [address] [place] [address] [place] [address]   DIAGNOSIS: Cancer Staging Malignant neoplasm of upper-outer quadrant of left breast in female, estrogen receptor positive ([place]) Staging form: Breast, AJCC 8th Edition - Clinical stage from 07/25/2018: Stage IB (cT2, cN0, cM0, G2, ER+, PR+, HER2+) - Signed by Nicholas Lose, MD on 07/25/2018   SUMMARY OF ONCOLOGIC HISTORY:   Malignant neoplasm of upper-outer quadrant of left breast in female, estrogen receptor positive ([place])   07/09/2018 Initial Diagnosis    Screening mammogram detected spiculated mass in the left breast upper outer quadrant, 2.6 cm by MRI, axilla negative, biopsy revealed grade 2 IDC with DCIS, ER 100%, PR 70%, Ki-67 10%, HER-2 3+ positive, T2N0 stage Ib clinical stage    07/25/2018 Cancer Staging    Staging form: Breast, AJCC 8th Edition - Clinical stage from 07/25/2018: Stage IB (cT2, cN0, cM0, G2, ER+, PR+, HER2+) - Signed by Nicholas Lose, MD on 07/25/2018    08/02/2018 -  Neo-Adjuvant Chemotherapy    Neoadjuvant chemotherapy with Taxol weekly with Herceptin and Perjeta every 3 weeks     CURRENT THERAPY: weekly Taxol with Herceptin/Perjeta every 3 weeks  INTERVAL HISTORY: Destiny Nash 73 y.o. female returns for evaluation prior to receiving her weekly Taxol.  She notes that she is tolerating it moderately well.  She last received Herceptin one week ago, Perjeta wasn't given due to her difficulty with diarrhea.  Destiny Nash still has diarrhea, but it is under better control with Lomotil in the morning Questran with lunch and dinner and then Imodium at night.  She is having four loose bowel movements per day.  Destiny Nash is fatigued.  She is able to do some stuff for herself such as eat, clean.  She notes she is living alone and her daughter is bringing her most of her meals.     Patient Active Problem List   Diagnosis Date Noted  . Port-A-Cath in place  08/09/2018  . Malignant neoplasm of upper-outer quadrant of left breast in female, estrogen receptor positive ([place]) 07/25/2018  . Unspecified atrial fibrillation ([place]) 02/14/2018  . Cellulitis, leg 02/13/2018  . Uncontrolled type 2 diabetes mellitus with hyperglycemia, with long-term current use of insulin ([place]) 02/13/2018  . Essential hypertension 02/13/2018  . Hyperlipidemia 02/13/2018  . Obesity, Class III, BMI 40-49.9 (morbid obesity) ([place]) 02/13/2018    is allergic to levaquin [levofloxacin] and codeine.  MEDICAL HISTORY: Past Medical History:  Diagnosis Date  . Anemia    in the past   . Atrial fibrillation ([place])    a. diagnosed in 02/2018.  . Bronchitis    easily  develops bronchitis when she has a cold  . Cancer ([place])    L breast - CA, Feb. 4th   . Cellulitis   . Diabetes mellitus without complication ([place])   . Dysrhythmia    afib, treating with Eliquis  . Heart murmur   . Hyperlipidemia   . Hypertension     SURGICAL HISTORY: Past Surgical History:  Procedure Laterality Date  . DILATION AND CURETTAGE OF UTERUS     x2 on same day  . PORTACATH PLACEMENT N/A 08/01/2018   Procedure: INSERTION PORT-A-CATH WITH ULTRASOUND;  Surgeon: Erroll Luna, MD;  Location: [facility];  Service: General;  Laterality: N/A;  . TONSILLECTOMY AND ADENOIDECTOMY      SOCIAL HISTORY: Social History   Socioeconomic History  . Marital status: Widowed    Spouse  name: Not on file  . Number of children: Not on file  . Years of education: Not on file  . Highest education level: Not on file  Occupational History  . Not on file  Social Needs  . Financial resource strain: Not on file  . Food insecurity:    Worry: Not on file    Inability: Not on file  . Transportation needs:    Medical: No    Non-medical: No  Tobacco Use  . Smoking status: Never Smoker  . Smokeless tobacco: Never Used  Substance and Sexual Activity  . Alcohol use: Not Currently  . Drug use: Never  . Sexual  activity: Not Currently  Lifestyle  . Physical activity:    Days per week: Not on file    Minutes per session: Not on file  . Stress: Not on file  Relationships  . Social connections:    Talks on phone: Not on file    Gets together: Not on file    Attends religious service: Not on file    Active member of club or organization: Not on file    Attends meetings of clubs or organizations: Not on file    Relationship status: Not on file  . Intimate partner violence:    Fear of current or ex partner: No    Emotionally abused: No    Physically abused: No    Forced sexual activity: No  Other Topics Concern  . Not on file  Social History Narrative   Her husband passed away on 06-27-18.   Religion: Christian.    FAMILY HISTORY: Family History  Problem Relation Age of Onset  . CAD Brother   . Hypertension Mother   . Hyperlipidemia Mother   . Hypertension Brother     Review of Systems  Constitutional: Positive for fatigue. Negative for appetite change, chills, fever and unexpected weight change.  HENT:   Negative for hearing loss, lump/mass, mouth sores, sore throat and trouble swallowing.   Eyes: Negative for eye problems and icterus.  Respiratory: Negative for chest tightness, cough and shortness of breath.   Cardiovascular: Negative for chest pain, leg swelling and palpitations.  Gastrointestinal: Positive for diarrhea. Negative for abdominal distention, abdominal pain, constipation, nausea and vomiting.  Endocrine: Negative for hot flashes.  Skin: Negative for itching and rash.  Neurological: Negative for dizziness, extremity weakness and numbness.  Hematological: Negative for adenopathy. Does not bruise/bleed easily.  Psychiatric/Behavioral: Negative for depression. The patient is not nervous/anxious.       PHYSICAL EXAMINATION  ECOG PERFORMANCE STATUS: 1 - Symptomatic but completely ambulatory  Vitals:   09/27/18 1050  BP: (!) 149/81  Pulse: 81  Resp: 18  Temp:  98.5 F (36.9 C)  SpO2: 100%    Physical Exam Constitutional:      General: She is not in acute distress.    Appearance: Normal appearance.  HENT:     Head: Normocephalic and atraumatic.     Nose: Nose normal.     Mouth/Throat:     Mouth: Mucous membranes are moist.     Pharynx: Oropharynx is clear. No oropharyngeal exudate or posterior oropharyngeal erythema.  Eyes:     Pupils: Pupils are equal, round, and reactive to light.  Neck:     Musculoskeletal: Neck supple.  Cardiovascular:     Rate and Rhythm: Normal rate and regular rhythm.     Pulses: Normal pulses.     Heart sounds: Normal heart sounds.  Pulmonary:  Effort: Pulmonary effort is normal.     Breath sounds: Normal breath sounds.  Abdominal:     General: Abdomen is flat. There is no distension.     Palpations: Abdomen is soft.     Tenderness: There is no abdominal tenderness.  Musculoskeletal:        General: No swelling.  Lymphadenopathy:     Cervical: No cervical adenopathy.  Skin:    General: Skin is warm and dry.     Capillary Refill: Capillary refill takes less than 2 seconds.     Findings: No rash.  Neurological:     General: No focal deficit present.     Mental Status: She is alert.  Psychiatric:        Mood and Affect: Mood normal.        Behavior: Behavior normal.     LABORATORY DATA:  CBC    Component Value Date/Time   WBC 6.0 09/27/2018 1040   WBC 16.3 (H) 02/15/2018 0500   RBC 3.53 (L) 09/27/2018 1040   HGB 9.4 (L) 09/27/2018 1040   HCT 29.5 (L) 09/27/2018 1040   PLT 210 09/27/2018 1040   MCV 83.6 09/27/2018 1040   MCH 26.6 09/27/2018 1040   MCHC 31.9 09/27/2018 1040   RDW 18.1 (H) 09/27/2018 1040   LYMPHSABS 0.7 09/27/2018 1040   MONOABS 0.5 09/27/2018 1040   EOSABS 0.2 09/27/2018 1040   BASOSABS 0.0 09/27/2018 1040    CMP     Component Value Date/Time   NA 140 09/27/2018 1040   K 3.3 (L) 09/27/2018 1040   CL 110 09/27/2018 1040   CO2 20 (L) 09/27/2018 1040   GLUCOSE  98 09/27/2018 1040   BUN 11 09/27/2018 1040   CREATININE 0.76 09/27/2018 1040   CALCIUM 8.0 (L) 09/27/2018 1040   PROT 6.2 (L) 09/27/2018 1040   ALBUMIN 3.0 (L) 09/27/2018 1040   AST 26 09/27/2018 1040   ALT 36 09/27/2018 1040   ALKPHOS 93 09/27/2018 1040   BILITOT 0.2 (L) 09/27/2018 1040   GFRNONAA >60 09/27/2018 1040   GFRAA >60 09/27/2018 1040        ASSESSMENT and THERAPY PLAN:   Malignant neoplasm of upper-outer quadrant of left breast in female, estrogen receptor positive ([place]) 07/09/2018:Screening mammogram detected spiculated mass in the left breast upper outer quadrant, 2.6 cm by MRI, axilla negative, 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 Neoadjuvant chemotherapy withTaxol weekly x12 Herceptin Perjeta every 3 weeksfollowed by Herceptinand Perjeta versus Kadcylamaintenance for 1 year 2. Followed by breast conserving surgery if possible with sentinel lymph node study 3. Followed by adjuvant radiation therapy if patient had lumpectomy 4.Followed by adjuvant antiestrogen therapy ------------------------------------------------------------------------------------------------------------------------------------------------------------------- Current treatment: Cycle7day 1 Taxol weekly with Herceptin and Perjeta every 3 weeks Echocardiogram 07/30/2018: EF 60 to 65%  Chemo toxicities: 1.Elevated blood sugars: hasn't been checking her sugars, I recommended she restart 2.Diarrhea: improved, will continue regimen with Lomotil, Questran, and Imodium 3.  Dehydration:  Will get 500cc of normal saline today with treatment 4.Normocytic anemia: received IV iron 2 weeks ago, tolerated well  We will request genetic consultation, this has not yet been scheduled.   Refer to Dr. Haroldine Laws for cardiac monitoring while undergoing Herceptin therapy  Return to clinic weekly for  chemoand every other week for follow-up with myself or Dr. Lindi Adie   All questions were answered. The patient knows to call the clinic  with any problems, questions or concerns.  We can certainly see the patient much sooner if necessary.  A total of (30) minutes of face-to-face time was spent with this patient with greater than 50% of that time in counseling and care-coordination.   This note was electronically signed.  Scot Dock, NP 09/27/2018

## 2018-09-27 NOTE — Assessment & Plan Note (Addendum)
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 Neoadjuvant chemotherapy withTaxol weekly x12 Herceptin Perjeta every 3 weeksfollowed by Herceptinand Perjeta versus Kadcylamaintenance for 1 year 2. Followed by breast conserving surgery if possible with sentinel lymph node study 3. Followed by adjuvant radiation therapy if patient had lumpectomy 4.Followed by adjuvant antiestrogen therapy ------------------------------------------------------------------------------------------------------------------------------------------------------------------- Current treatment: Cycle7day 1 Taxol weekly with Herceptin and Perjeta every 3 weeks Echocardiogram 07/30/2018: EF 60 to 65%  Chemo toxicities: 1.Elevated blood sugars: hasn't been checking her sugars, I recommended she restart 2.Diarrhea: improved, will continue regimen with Lomotil, Questran, and Imodium 3.  Dehydration: Will get 500cc of normal saline today with treatment 4.Normocytic anemia: received IV iron 2 weeks ago, tolerated well  We will request genetic consultation, this has not yet been scheduled.   Refer to Dr. Bensimhon for cardiac monitoring while undergoing Herceptin therapy  Return to clinic weekly for chemoand every other week for follow-up with myself or Dr. Gudena  

## 2018-09-27 NOTE — Patient Instructions (Signed)
Coronavirus (COVID-19) Are you at risk?  Are you at risk for the Coronavirus (COVID-19)?  To be considered HIGH RISK for Coronavirus (COVID-19), you have to meet the following criteria:  . Traveled to [place], [place], [place], [place] or [place]; or in the [place] to [place], [place], [place], or [place]; and have fever, cough, and shortness of breath within the last 2 weeks of travel OR . Been in close contact with a person diagnosed with COVID-19 within the last 2 weeks and have fever, cough, and shortness of breath . IF YOU DO NOT MEET THESE CRITERIA, YOU ARE CONSIDERED LOW RISK FOR COVID-19.  What to do if you are HIGH RISK for COVID-19?  Marland Kitchen If you are having a medical emergency, call 911. . Seek medical care right away. Before you go to a doctor's office, urgent care or emergency department, call ahead and tell them about your recent travel, contact with someone diagnosed with COVID-19, and your symptoms. You should receive instructions from your physician's office regarding next steps of care.  . When you arrive at healthcare provider, tell the healthcare staff immediately you have returned from visiting [place], [place], [place], [place] or [place]; or traveled in the [place] to [place], [place], [place], or [place]; in the last two weeks or you have been in close contact with a person diagnosed with COVID-19 in the last 2 weeks.   . Tell the health care staff about your symptoms: fever, cough and shortness of breath. . After you have been seen by a medical provider, you will be either: o Tested for (COVID-19) and discharged home on quarantine except to seek medical care if symptoms worsen, and asked to  - Stay home and avoid contact with others until you get your results (4-5 days)  - Avoid travel on public transportation if possible (such as bus, train, or airplane) or o Sent to the Emergency Department by EMS for evaluation, COVID-19 testing, and possible  admission depending on your condition and test results.  What to do if you are LOW RISK for COVID-19?  Reduce your risk of any infection by using the same precautions used for avoiding the common cold or flu:  Marland Kitchen Wash your hands often with soap and warm water for at least 20 seconds.  If soap and water are not readily available, use an alcohol-based hand sanitizer with at least 60% alcohol.  . If coughing or sneezing, cover your mouth and nose by coughing or sneezing into the elbow areas of your shirt or coat, into a tissue or into your sleeve (not your hands). . Avoid shaking hands with others and consider head nods or verbal greetings only. . Avoid touching your eyes, nose, or mouth with unwashed hands.  . Avoid close contact with people who are sick. . Avoid places or events with large numbers of people in one location, like concerts or sporting events. . Carefully consider travel plans you have or are making. . If you are planning any travel outside or inside the [place], visit the CDC's Travelers' Health webpage for the latest health notices. . If you have some symptoms but not all symptoms, continue to monitor at home and seek medical attention if your symptoms worsen. . If you are having a medical emergency, call 911.   [place] / e-Visit: [email or web address]         [facility]: [place]  [facility]: [facility]: [place] Discharge Instructions for Patients Receiving Chemotherapy  Today you received the following chemotherapy agents: Paclitaxel (Taxol)  To help prevent nausea and vomiting after your treatment, we encourage you to take your nausea medication as directed.    If you develop nausea and vomiting that is not controlled by your nausea medication, call the clinic.    BELOW ARE SYMPTOMS THAT SHOULD BE REPORTED IMMEDIATELY:  *FEVER GREATER THAN 100.5 F  *CHILLS WITH OR WITHOUT FEVER  NAUSEA AND VOMITING THAT IS NOT CONTROLLED WITH YOUR NAUSEA MEDICATION  *UNUSUAL SHORTNESS OF BREATH  *UNUSUAL BRUISING OR BLEEDING  TENDERNESS IN MOUTH AND THROAT WITH OR WITHOUT PRESENCE OF ULCERS  *URINARY PROBLEMS  *BOWEL PROBLEMS  UNUSUAL RASH Items with * indicate a potential emergency and should be followed up as soon as possible.  Feel free to call the clinic should you have any questions or concerns. The clinic phone number is [phone].  Please show the [place] at check-in to the Emergency Department and triage nurse.

## 2018-09-27 NOTE — Patient Instructions (Signed)
[employer], Adult A central line is a thin, flexible tube (catheter) that is put in your vein. It can be used to:  Give you medicine.  Give you food and nutrients. Follow these instructions at home: Caring for the tube   Follow instructions from your doctor about: ? Flushing the tube with saline solution. ? Cleaning the tube and the area around it.  Only flush with clean (sterile) supplies. The supplies should be from your doctor, a pharmacy, or another place that your doctor recommends.  Before you flush the tube or clean the area around the tube: ? Wash your hands with soap and water. If you cannot use soap and water, use hand sanitizer. ? Clean the central line hub with rubbing alcohol. Caring for your skin  Keep the area where the tube was put in clean and dry.  Every day, and when changing the bandage, check the skin around the central line for: ? Redness, swelling, or pain. ? Fluid or blood. ? Warmth. ? Pus. ? A bad smell. General instructions  Keep the tube clamped, unless it is being used.  Keep your supplies in a clean, dry location.  If you or someone else accidentally pulls on the tube, make sure: ? The bandage (dressing) is okay. ? There is no bleeding. ? The tube has not been pulled out.  Do not use scissors or sharp objects near the tube.  Do not swim or let the tube soak in a tub.  Ask your doctor what activities are safe for you. Your doctor may tell you not to lift anything or move your arm too much.  Take over-the-counter and prescription medicines only as told by your doctor.  Change bandages as told by your doctor.  Keep your bandage dry. If a bandage gets wet, have it changed right away.  Keep all follow-up visits as told by your doctor. This is important. Throwing away supplies  Throw away any syringes in a trash (disposal) container that is only for sharp items (sharps container). You can buy a sharps container from a pharmacy, or you  can make one by using an empty hard plastic bottle with a cover.  Place any used bandages or infusion bags into a plastic bag. Throw that bag in the trash. Contact a doctor if:  You have any of these where the tube was put in: ? Redness, swelling, or pain. ? Fluid or blood. ? A warm feeling. ? Pus or a bad smell. Get help right away if:  You have: ? A fever. ? Chills. ? Trouble getting enough air (shortness of breath). ? Trouble breathing. ? Pain in your chest. ? Swelling in your neck, face, chest, or arm.  You are coughing.  You feel your heart beating fast or skipping beats.  You feel dizzy or you pass out (faint).  There are red lines coming from where the tube was put in.  The area where the tube was put in is bleeding and the bleeding will not stop.  Your tube is hard to flush.  You do not get a blood return from the tube.  The tube gets loose or comes out.  The tube has a hole or a tear.  The tube leaks. Summary  A central line is a thin, flexible tube (catheter) that is put in your vein. It can be used to take blood for lab tests or to give you medicine.  Follow instructions from your doctor about flushing and cleaning the  tube.  Keep the area where the tube was put in clean and dry.  Ask your doctor what activities are safe for you. This information is not intended to replace advice given to you by your health care provider. Make sure you discuss any questions you have with your health care provider. Document Released: 05/08/2012 Document Revised: 06/08/2016 Document Reviewed: 06/08/2016 Elsevier Interactive Patient Education  2019 [employer].

## 2018-09-27 NOTE — Patient Instructions (Signed)
Pantoprazole tablets What is this medicine? PANTOPRAZOLE (pan TOE pra zole) prevents the production of acid in the stomach. It is used to treat gastroesophageal reflux disease (GERD), inflammation of the esophagus, and Zollinger-Ellison syndrome. This medicine may be used for other purposes; ask your health care provider or pharmacist if you have questions. COMMON BRAND NAME(S): Protonix What should I tell my health care provider before I take this medicine? They need to know if you have any of these conditions: -liver disease -low levels of magnesium in the blood -lupus -an unusual or allergic reaction to omeprazole, lansoprazole, pantoprazole, rabeprazole, other medicines, foods, dyes, or preservatives -pregnant or trying to get pregnant -breast-feeding How should I use this medicine? Take this medicine by mouth. Swallow the tablets whole with a drink of water. Follow the directions on the prescription label. Do not crush, break, or chew. Take your medicine at regular intervals. Do not take your medicine more often than directed. Talk to your pediatrician regarding the use of this medicine in children. While this drug may be prescribed for children as young as 5 years for selected conditions, precautions do apply. Overdosage: If you think you have taken too much of this medicine contact a poison control center or emergency room at once. NOTE: This medicine is only for you. Do not share this medicine with others. What if I miss a dose? If you miss a dose, take it as soon as you can. If it is almost time for your next dose, take only that dose. Do not take double or extra doses. What may interact with this medicine? Do not take this medicine with any of the following medications: -atazanavir -nelfinavir This medicine may also interact with the following medications: -ampicillin -delavirdine -erlotinib -iron salts -medicines for fungal infections like ketoconazole, itraconazole and  voriconazole -methotrexate -mycophenolate mofetil -warfarin This list may not describe all possible interactions. Give your health care provider a list of all the medicines, herbs, non-prescription drugs, or dietary supplements you use. Also tell them if you smoke, drink alcohol, or use illegal drugs. Some items may interact with your medicine. What should I watch for while using this medicine? It can take several days before your stomach pain gets better. Check with your doctor or health care professional if your condition does not start to get better, or if it gets worse. You may need blood work done while you are taking this medicine. This medicine may cause a decrease in vitamin B12. You should make sure that you get enough vitamin B12 while you are taking this medicine. Discuss the foods you eat and the vitamins you take with your health care professional. What side effects may I notice from receiving this medicine? Side effects that you should report to your doctor or health care professional as soon as possible: - allergic reactions like skin rash, itching or hives, swelling of the face, lips, or tongue - bone, muscle or joint pain - breathing problems - chest pain or chest tightness - dark yellow or brown urine - dizziness - fast, irregular heartbeat - feeling faint or lightheaded - fever or sore throat - muscle spasm - palpitations - rash on cheeks or arms that gets worse in the sun - redness, blistering, peeling or loosening of the skin, including inside the mouth - seizures -stomach polyps - tremors - unusual bleeding or bruising - unusually weak or tired - yellowing of the eyes or skin Side effects that usually do not require medical attention (report to your  doctor or health care professional if they continue or are bothersome): - constipation - diarrhea - dry mouth - headache - nausea This list may not describe all possible side effects. Call  your doctor for medical advice about side effects. You may report side effects to FDA at 1-800-FDA-1088. Where should I keep my medicine? Keep out of the reach of children. Store at room temperature between 15 and 30 degrees C (59 and 86 degrees F). Protect from light and moisture. Throw away any unused medicine after the expiration date. NOTE: This sheet is a summary. It may not cover all possible information. If you have questions about this medicine, talk to your doctor, pharmacist, or health care provider.  2019 Elsevier/Gold Standard (2017-01-05 13:51:59)

## 2018-09-27 NOTE — Telephone Encounter (Signed)
Scheduled appt per 4/24 sch message - unable to reach patient . Left message with apt date and time

## 2018-10-03 ENCOUNTER — Other Ambulatory Visit: Payer: Self-pay | Admitting: Genetic Counselor

## 2018-10-03 DIAGNOSIS — C50412 Malignant neoplasm of upper-outer quadrant of left female breast: Secondary | ICD-10-CM

## 2018-10-03 DIAGNOSIS — Z170 Estrogen receptor positive status [ER+]: Principal | ICD-10-CM

## 2018-10-04 ENCOUNTER — Telehealth: Payer: Self-pay | Admitting: Hematology and Oncology

## 2018-10-04 ENCOUNTER — Other Ambulatory Visit: Payer: Self-pay

## 2018-10-04 ENCOUNTER — Encounter: Payer: Self-pay | Admitting: Genetic Counselor

## 2018-10-04 ENCOUNTER — Inpatient Hospital Stay (HOSPITAL_BASED_OUTPATIENT_CLINIC_OR_DEPARTMENT_OTHER): Payer: Medicare Other | Admitting: Genetic Counselor

## 2018-10-04 ENCOUNTER — Inpatient Hospital Stay: Payer: Medicare Other | Attending: Hematology and Oncology

## 2018-10-04 ENCOUNTER — Inpatient Hospital Stay: Payer: Medicare Other

## 2018-10-04 ENCOUNTER — Encounter: Payer: Self-pay | Admitting: *Deleted

## 2018-10-04 VITALS — BP 117/68 | HR 86 | Temp 97.5°F | Resp 20

## 2018-10-04 DIAGNOSIS — Z95828 Presence of other vascular implants and grafts: Secondary | ICD-10-CM

## 2018-10-04 DIAGNOSIS — I4891 Unspecified atrial fibrillation: Secondary | ICD-10-CM | POA: Diagnosis not present

## 2018-10-04 DIAGNOSIS — Z7183 Encounter for nonprocreative genetic counseling: Secondary | ICD-10-CM | POA: Diagnosis not present

## 2018-10-04 DIAGNOSIS — Z5112 Encounter for antineoplastic immunotherapy: Secondary | ICD-10-CM | POA: Insufficient documentation

## 2018-10-04 DIAGNOSIS — R197 Diarrhea, unspecified: Secondary | ICD-10-CM | POA: Insufficient documentation

## 2018-10-04 DIAGNOSIS — Z170 Estrogen receptor positive status [ER+]: Secondary | ICD-10-CM

## 2018-10-04 DIAGNOSIS — E46 Unspecified protein-calorie malnutrition: Secondary | ICD-10-CM | POA: Insufficient documentation

## 2018-10-04 DIAGNOSIS — C50412 Malignant neoplasm of upper-outer quadrant of left female breast: Secondary | ICD-10-CM | POA: Diagnosis not present

## 2018-10-04 DIAGNOSIS — E860 Dehydration: Secondary | ICD-10-CM | POA: Insufficient documentation

## 2018-10-04 DIAGNOSIS — Z7901 Long term (current) use of anticoagulants: Secondary | ICD-10-CM | POA: Insufficient documentation

## 2018-10-04 DIAGNOSIS — Z803 Family history of malignant neoplasm of breast: Secondary | ICD-10-CM | POA: Insufficient documentation

## 2018-10-04 DIAGNOSIS — R531 Weakness: Secondary | ICD-10-CM | POA: Insufficient documentation

## 2018-10-04 DIAGNOSIS — L03115 Cellulitis of right lower limb: Secondary | ICD-10-CM | POA: Insufficient documentation

## 2018-10-04 DIAGNOSIS — Z79899 Other long term (current) drug therapy: Secondary | ICD-10-CM | POA: Insufficient documentation

## 2018-10-04 DIAGNOSIS — E119 Type 2 diabetes mellitus without complications: Secondary | ICD-10-CM | POA: Diagnosis not present

## 2018-10-04 DIAGNOSIS — Z5111 Encounter for antineoplastic chemotherapy: Secondary | ICD-10-CM | POA: Diagnosis not present

## 2018-10-04 DIAGNOSIS — Z8049 Family history of malignant neoplasm of other genital organs: Secondary | ICD-10-CM | POA: Diagnosis not present

## 2018-10-04 LAB — CMP (CANCER CENTER ONLY)
ALT: 18 U/L (ref 0–44)
AST: 18 U/L (ref 15–41)
Albumin: 2.9 g/dL — ABNORMAL LOW (ref 3.5–5.0)
Alkaline Phosphatase: 90 U/L (ref 38–126)
Anion gap: 10 (ref 5–15)
BUN: 9 mg/dL (ref 8–23)
CO2: 20 mmol/L — ABNORMAL LOW (ref 22–32)
Calcium: 7.6 mg/dL — ABNORMAL LOW (ref 8.9–10.3)
Chloride: 111 mmol/L (ref 98–111)
Creatinine: 0.76 mg/dL (ref 0.44–1.00)
GFR, Est AFR Am: >60 mL/min (ref >60)
GFR, Estimated: >60 mL/min (ref >60)
Glucose, Bld: 53 mg/dL — ABNORMAL LOW (ref 70–99)
Potassium: 3.2 mmol/L — ABNORMAL LOW (ref 3.5–5.1)
Sodium: 141 mmol/L (ref 135–145)
Total Bilirubin: 0.3 mg/dL (ref 0.3–1.2)
Total Protein: 6.2 g/dL — ABNORMAL LOW (ref 6.5–8.1)

## 2018-10-04 LAB — CBC WITH DIFFERENTIAL (CANCER CENTER ONLY)
Abs Immature Granulocytes: 0.08 10*3/uL — ABNORMAL HIGH (ref 0.00–0.07)
Basophils Absolute: 0.1 10*3/uL (ref 0.0–0.1)
Basophils Relative: 1 %
Eosinophils Absolute: 0.1 10*3/uL (ref 0.0–0.5)
Eosinophils Relative: 2 %
HCT: 30.3 % — ABNORMAL LOW (ref 36.0–46.0)
Hemoglobin: 9.5 g/dL — ABNORMAL LOW (ref 12.0–15.0)
Immature Granulocytes: 1 %
Lymphocytes Relative: 12 %
Lymphs Abs: 0.8 10*3/uL (ref 0.7–4.0)
MCH: 26.2 pg (ref 26.0–34.0)
MCHC: 31.4 g/dL (ref 30.0–36.0)
MCV: 83.7 fL (ref 80.0–100.0)
Monocytes Absolute: 0.5 10*3/uL (ref 0.1–1.0)
Monocytes Relative: 7 %
Neutro Abs: 5.6 10*3/uL (ref 1.7–7.7)
Neutrophils Relative %: 77 %
Platelet Count: 262 10*3/uL (ref 150–400)
RBC: 3.62 MIL/uL — ABNORMAL LOW (ref 3.87–5.11)
RDW: 17.6 % — ABNORMAL HIGH (ref 11.5–15.5)
WBC Count: 7.2 10*3/uL (ref 4.0–10.5)
nRBC: 0 % (ref 0.0–0.2)

## 2018-10-04 MED ORDER — SODIUM CHLORIDE 0.9 % IV SOLN
50.0000 mg/m2 | Freq: Once | INTRAVENOUS | Status: AC
  Administered 2018-10-04: 126 mg via INTRAVENOUS
  Filled 2018-10-04: qty 21

## 2018-10-04 MED ORDER — SODIUM CHLORIDE 0.9 % IV SOLN
Freq: Once | INTRAVENOUS | Status: AC
  Administered 2018-10-04: 10:00:00 via INTRAVENOUS
  Filled 2018-10-04: qty 250

## 2018-10-04 MED ORDER — HEPARIN SOD (PORK) LOCK FLUSH 100 UNIT/ML IV SOLN
500.0000 [IU] | Freq: Once | INTRAVENOUS | Status: AC | PRN
  Administered 2018-10-04: 500 [IU]
  Filled 2018-10-04: qty 5

## 2018-10-04 MED ORDER — DIPHENHYDRAMINE HCL 50 MG/ML IJ SOLN
25.0000 mg | Freq: Once | INTRAMUSCULAR | Status: AC
  Administered 2018-10-04: 11:00:00 25 mg via INTRAVENOUS

## 2018-10-04 MED ORDER — FAMOTIDINE IN NACL 20-0.9 MG/50ML-% IV SOLN
INTRAVENOUS | Status: AC
  Filled 2018-10-04: qty 50

## 2018-10-04 MED ORDER — FAMOTIDINE IN NACL 20-0.9 MG/50ML-% IV SOLN
20.0000 mg | Freq: Once | INTRAVENOUS | Status: AC
  Administered 2018-10-04: 20 mg via INTRAVENOUS

## 2018-10-04 MED ORDER — DIPHENHYDRAMINE HCL 50 MG/ML IJ SOLN
INTRAMUSCULAR | Status: AC
  Filled 2018-10-04: qty 1

## 2018-10-04 MED ORDER — SODIUM CHLORIDE 0.9% FLUSH
10.0000 mL | Freq: Once | INTRAVENOUS | Status: AC
  Administered 2018-10-04: 10 mL
  Filled 2018-10-04: qty 10

## 2018-10-04 MED ORDER — SODIUM CHLORIDE 0.9% FLUSH
10.0000 mL | INTRAVENOUS | Status: DC | PRN
  Administered 2018-10-04: 10 mL
  Filled 2018-10-04: qty 10

## 2018-10-04 NOTE — Progress Notes (Signed)
Patient to increase potassium intake via foods per Dr. Lindi Adie.  Infusion nurse to add education with AVS regarding hypokalemia.

## 2018-10-04 NOTE — Progress Notes (Signed)
REFERRING PROVIDER: Nicholas Lose, MD [address] [place], [place] [address]  PRIMARY PROVIDER:  [name], MD  PRIMARY REASON FOR VISIT:  1. Malignant neoplasm of upper-outer quadrant of left breast in female, estrogen receptor positive ([place])   2. Family history of breast cancer   3. Family history of uterine cancer      HISTORY OF PRESENT ILLNESS:   Ms. Bedgood, a 73 y.o. female, was seen for a [place] cancer genetics consultation at the request of Dr. Lindi Adie due to a personal and family history of breast cancer.  Ms. Mallet presents to clinic today to discuss the possibility of a hereditary predisposition to cancer, genetic testing, and to further clarify her future cancer risks, as well as potential cancer risks for family members.   In February 2020, at the age of 47, Ms. 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 The treatment plan chemotherapy, lumpectomy and radiation.     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  First live birth at age 61.  OCP use for approximately 0 years.  Ovaries intact: yes.  Hysterectomy: no.  Menopausal status: postmenopausal.  HRT use: 0 years. Colonoscopy: no;  not examined. Mammogram within the last year: yes. Number of breast biopsies: 1. Up to date with pelvic exams: yes. Any excessive radiation exposure in the past: no  Past Medical History:  Diagnosis Date  . Anemia    in the past   . Atrial fibrillation ([place])    a. diagnosed in 02/2018.  . Bronchitis    easily  develops bronchitis when she has a cold  . Cancer ([place])    L breast - CA, Feb. 4th   . Cellulitis   . Diabetes mellitus without complication ([place])   . Dysrhythmia    afib, treating with Eliquis  . Family history of breast cancer   . Family history of uterine cancer   . Heart murmur   . Hyperlipidemia   . Hypertension     Past Surgical History:  Procedure Laterality Date  . DILATION AND CURETTAGE OF UTERUS     x2 on same day  . PORTACATH PLACEMENT N/A 08/01/2018   Procedure: INSERTION PORT-A-CATH WITH ULTRASOUND;  Surgeon: Erroll Luna, MD;  Location: [facility];  Service: General;  Laterality: N/A;  . TONSILLECTOMY AND ADENOIDECTOMY      Social History   Socioeconomic History  . Marital status: Widowed    Spouse name: Not on file  . Number of children: Not on file  . Years of education: Not on file  . Highest education level: Not on file  Occupational History  . Not on file  Social Needs  .  Financial resource strain: Not on file  . Food insecurity:    Worry: Not on file    Inability: Not on file  . Transportation needs:    Medical: No    Non-medical: No  Tobacco Use  . Smoking status: Never Smoker  . Smokeless tobacco: Never Used  Substance and Sexual Activity  . Alcohol use: Not Currently  . Drug use: Never  . Sexual activity: Not Currently  Lifestyle  . Physical activity:    Days per week: Not on file    Minutes per session: Not on file  . Stress: Not on file  Relationships  . Social connections:    Talks on phone: Not on file    Gets together: Not on file    Attends religious service: Not on file    Active member of club or organization:  Not on file    Attends meetings of clubs or organizations: Not on file    Relationship status: Not on file  Other Topics Concern  . Not on file  Social History Narrative   Her husband passed away on 07/10/2018.   Religion: Christian.     FAMILY HISTORY:  We obtained a detailed, 4-generation family history.  Significant diagnoses are listed below: Family History  Problem Relation Age of Onset  . CAD Brother   . Hypertension Mother   . Hyperlipidemia Mother   . Hypertension Brother   . Uterine cancer Maternal Grandmother   . Breast cancer Cousin        dx in her 70s; mat first cousin  . Breast cancer Cousin        dx in her 5s-60s; mat first cousin    The patient has a son and daughter who are cancer free.  She has three maternal half brothers and a maternal half sister who are cancer free.  Both parents are deceased.  The biological father is unknown.  The patient's mother died at 1.  She had Charcot-Marie-Tooth.  She had two brothers and a sister who are all cancer free. Her sister has a daughter who had breast cancer in her 10's, a brother had a daughter who died of breast cancer in her 44's-60's, and the other brother has a daughter who had skin cancer.  The maternal grandparents are deceased.  The grandmother had uterine cancer.  Ms. Screws is unaware of previous family history of genetic testing for hereditary cancer risks. Patient's maternal ancestors are of [place] and [place] descent, and paternal ancestors are of unknown descent. There is no reported Ashkenazi Jewish ancestry. There is no known consanguinity.  GENETIC COUNSELING ASSESSMENT: Ms. Hanken is a 73 y.o. female with a personal and family history of cancer which is somewhat suggestive of a hereditary cancer syndrome and predisposition to cancer. We, therefore, discussed and recommended the following at today's visit.   DISCUSSION: We discussed that 5 - 10% of breast cancer is hereditary, with most cases  associated with BRCA mutations.  There are other genes that can be associated with hereditary breast cancer syndromes.  We discussed that testing is beneficial for several reasons including knowing how to follow individuals after completing their treatment, identifying whether potential treatment options such as PARP inhibitors would be beneficial, and understand if other family members could be at risk for cancer and allow them to undergo genetic testing.   We reviewed the characteristics, features and inheritance patterns of hereditary cancer syndromes. We also discussed genetic testing, including the appropriate family members to test,  the process of testing, insurance coverage and turn-around-time for results. We discussed the implications of a negative, positive and/or variant of uncertain significant result. We recommended [name] pursue genetic testing for the multi cancer gene panel. The Multi-Gene Panel offered by [facility] includes sequencing and/or deletion duplication testing of the following 85 genes: AIP, ALK, APC, ATM, AXIN2,BAP1,  BARD1, BLM, BMPR1A, BRCA1, BRCA2, BRIP1, CASR, CDC73, CDH1, CDK4, CDKN1B, CDKN1C, CDKN2A (p14ARF), CDKN2A (p16INK4a), CEBPA, CHEK2, CTNNA1, DICER1, DIS3L2, EGFR (c.2369C>T, p.Thr790Met variant only), EPCAM (Deletion/duplication testing only), FH, FLCN, GATA2, GPC3, GREM1 (Promoter region deletion/duplication testing only), HOXB13 (c.251G>A, p.Gly84Glu), HRAS, KIT, MAX, MEN1, MET, MITF (c.952G>A, p.Glu318Lys variant only), MLH1, MSH2, MSH3, MSH6, MUTYH, NBN, NF1, NF2, NTHL1, PALB2, PDGFRA, PHOX2B, PMS2, POLD1, POLE, POT1, PRKAR1A, PTCH1, PTEN, RAD50, RAD51C, RAD51D, RB1, RECQL4, RET, RNF43, RUNX1, SDHAF2, SDHA (sequence changes only), SDHB, SDHC, SDHD, SMAD4, SMARCA4, SMARCB1, SMARCE1, STK11, SUFU, TERC, TERT, TMEM127, TP53, TSC1, TSC2, VHL, WRN and WT1.    Based on Ms. Fritcher's personal and family history of cancer, she meets medical criteria for genetic testing.  Despite that she meets criteria, she may still have an out of pocket cost. We discussed that if her out of pocket cost for testing is over $100, the laboratory will call and confirm whether she wants to proceed with testing.  If the out of pocket cost of testing is less than $100 she will be billed by the genetic testing laboratory.   PLAN: After considering the risks, benefits, and limitations, [name] provided informed consent to pursue genetic testing and the blood sample was sent to [facility] for analysis of the multi-cancer panel. Results should be available within approximately 2-3 weeks' time, at which point they will be disclosed by telephone to [name], as will any additional recommendations warranted by these results. Ms. Montrose will receive a summary of her genetic counseling visit and a copy of her results once available. This information will also be available in Epic.   Lastly, we encouraged [name] to remain in contact with cancer genetics annually so that we can continuously update the family history and inform her of any changes in cancer genetics and testing that may be of benefit for this family.   Ms. Swander questions were answered to her satisfaction today. Our contact information was provided should additional questions or concerns arise. Thank you for the referral and allowing [place] to share in the care of your patient.   Claire Dolores P. Florene Glen, [place], [facility] Certified Genetic Counselor [email or web address] phone: [phone]  The patient was seen for a total of 35 minutes in face-to-face genetic counseling.  This patient was discussed with Drs. Magrinat, Lindi Adie and/or Burr Medico who agrees with the above.    _______________________________________________________________________ For Office Staff:  Number of people involved in session: 1 Was an Intern/ student involved with case: no

## 2018-10-04 NOTE — Patient Instructions (Addendum)
Hypokalemia Hypokalemia means that the amount of potassium in the blood is lower than normal.Potassium is a chemical that helps regulate the amount of fluid in the body (electrolyte). It also stimulates muscle tightening (contraction) and helps nerves work properly.Normally, most of the body's potassium is inside of cells, and only a very small amount is in the blood. Because the amount in the blood is so small, minor changes to potassium levels in the blood can be life-threatening. What are the causes? This condition may be caused by:  Antibiotic medicine.  Diarrhea or vomiting. Taking too much of a medicine that helps you have a bowel movement (laxative) can cause diarrhea and lead to hypokalemia.  Chronic kidney disease (CKD).  Medicines that help the body get rid of excess fluid (diuretics).  Eating disorders, such as bulimia.  Low magnesium levels in the body.  Sweating a lot. What are the signs or symptoms? Symptoms of this condition include:  Weakness.  Constipation.  Fatigue.  Muscle cramps.  Mental confusion.  Skipped heartbeats or irregular heartbeat (palpitations).  Tingling or numbness. How is this diagnosed? This condition is diagnosed with a blood test. How is this treated? Hypokalemia can be treated by taking potassium supplements by mouth or adjusting the medicines that you take. Treatment may also include eating more foods that contain a lot of potassium. If your potassium level is very low, you may need to get potassium through an IV tube in one of your veins and be monitored in the hospital. Follow these instructions at home:   Take over-the-counter and prescription medicines only as told by your health care provider. This includes vitamins and supplements.  Eat a healthy diet. A healthy diet includes fresh fruits and vegetables, whole grains, healthy fats, and lean proteins.  If instructed, eat more foods that contain a lot of potassium, such  as: ? Nuts, such as peanuts and pistachios. ? Seeds, such as sunflower seeds and pumpkin seeds. ? Peas, lentils, and lima beans. ? Whole grain and bran cereals and breads. ? Fresh fruits and vegetables, such as apricots, avocado, bananas, cantaloupe, kiwi, oranges, tomatoes, asparagus, and potatoes. ? Orange juice. ? Tomato juice. ? Red meats. ? Yogurt.  Keep all follow-up visits as told by your health care provider. This is important. Contact a health care provider if:  You have weakness that gets worse.  You feel your heart pounding or racing.  You vomit.  You have diarrhea.  You have diabetes (diabetes mellitus) and you have trouble keeping your blood sugar (glucose) in your target range. Get help right away if:  You have chest pain.  You have shortness of breath.  You have vomiting or diarrhea that lasts for more than 2 days.  You faint. This information is not intended to replace advice given to you by your health care provider. Make sure you discuss any questions you have with your health care provider. Document Released: 05/22/2005 Document Revised: 01/08/2016 Document Reviewed: 01/08/2016 Elsevier Interactive Patient Education  2019 [facility] Discharge Instructions for Patients Receiving Chemotherapy  Today you received the following chemotherapy agents Taxol.  To help prevent nausea and vomiting after your treatment, we encourage you to take your nausea medication as directed.  If you develop nausea and vomiting that is not controlled by your nausea medication, call the clinic.   BELOW ARE SYMPTOMS THAT SHOULD BE REPORTED IMMEDIATELY:  *FEVER GREATER THAN 100.5 F  *CHILLS WITH OR WITHOUT FEVER  NAUSEA AND VOMITING  THAT IS NOT CONTROLLED WITH YOUR NAUSEA MEDICATION  *UNUSUAL SHORTNESS OF BREATH  *UNUSUAL BRUISING OR BLEEDING  TENDERNESS IN MOUTH AND THROAT WITH OR WITHOUT PRESENCE OF ULCERS  *URINARY PROBLEMS  *BOWEL  PROBLEMS  UNUSUAL RASH Items with * indicate a potential emergency and should be followed up as soon as possible.  Feel free to call the clinic should you have any questions or concerns. The clinic phone number is [phone].  Please show the [facility] at check-in to the Emergency Department and triage nurse.

## 2018-10-04 NOTE — Telephone Encounter (Signed)
Scheduled appt per 5/1 sch message - pt to get an updated schedule in treatment area.

## 2018-10-07 ENCOUNTER — Other Ambulatory Visit: Payer: Self-pay | Admitting: *Deleted

## 2018-10-08 ENCOUNTER — Encounter: Payer: Self-pay | Admitting: *Deleted

## 2018-10-08 ENCOUNTER — Encounter: Payer: Self-pay | Admitting: Hematology and Oncology

## 2018-10-10 ENCOUNTER — Telehealth: Payer: Self-pay | Admitting: Hematology and Oncology

## 2018-10-10 NOTE — Telephone Encounter (Signed)
Called to confirm mad and lab appt added for 5/8 - not able to reach patient. Left message

## 2018-10-10 NOTE — Progress Notes (Signed)
Patient Care Team: [name], MD as PCP - General (Family Medicine) Harl Bowie Alphonse Guild, MD as PCP - Cardiology (Cardiology)  DIAGNOSIS:    ICD-10-CM   1. Malignant neoplasm of upper-outer quadrant of left breast in female, estrogen receptor positive ([place]) C50.412 MR BREAST BILATERAL W [place] CAD   Z17.0     SUMMARY OF ONCOLOGIC HISTORY:   Malignant neoplasm of upper-outer quadrant of left breast in female, estrogen receptor positive ([place])   07/09/2018 Initial Diagnosis    Screening mammogram detected spiculated mass in the left breast upper outer quadrant, 2.6 cm by MRI, axilla negative, biopsy revealed grade 2 IDC with DCIS, ER 100%, PR 70%, Ki-67 10%, HER-2 3+ positive, T2N0 stage Ib clinical stage    07/25/2018 Cancer Staging    Staging form: Breast, AJCC 8th Edition - Clinical stage from 07/25/2018: Stage IB (cT2, cN0, cM0, G2, ER+, PR+, HER2+) - Signed by Nicholas Lose, MD on 07/25/2018    08/02/2018 -  Neo-Adjuvant Chemotherapy    Neoadjuvant chemotherapy with Taxol weekly with Herceptin and Perjeta every 3 weeks     CHIEF COMPLIANT: Cycle 9 Taxol  INTERVAL HISTORY: Destiny Nash is a 73 y.o. with above-mentioned history of left breast cancer currently on neoadjuvant chemotherapy with weekly Taxol and Herceptin Perjeta every 3 weeks. She presents to the clinic today for cycle 9.  She has developed cellulitis of the right lower extremity.  She has been on doxycycline for a week and it does not seem to have improved.  She is profoundly weak.  She continues to have recurrent diarrhea.  This is in spite of Lomotil and Imodium and Questran.  We have not been treating her with Perjeta.  In spite of that the diarrhea has not improved.  REVIEW OF SYSTEMS:   Constitutional: Denies fevers, chills or abnormal weight loss, complains of severe fatigue and is in a wheelchair. Eyes: Denies blurriness of vision Ears, nose, mouth, throat, and face: Denies mucositis or sore throat  Respiratory: Denies cough, dyspnea or wheezes Cardiovascular: Denies palpitation, chest discomfort Gastrointestinal: Chronic diarrhea Skin: Denies abnormal skin rashes Lymphatics: Denies new lymphadenopathy or easy bruising Neurological: Denies numbness, tingling or new weaknesses Behavioral/Psych: Mood is stable, no new changes  Extremities: Bilateral lower extremity cellulitis right greater than left Breast: denies any pain or lumps or nodules in either breasts All other systems were reviewed with the patient and are negative.  I have reviewed the past medical history, past surgical history, social history and family history with the patient and they are unchanged from previous note.  ALLERGIES:  is allergic to levaquin [levofloxacin] and codeine.  MEDICATIONS:  Current Outpatient Medications  Medication Sig Dispense Refill  . acetaminophen (TYLENOL) 500 MG tablet Take 1,000 mg by mouth every 6 (six) hours as needed for moderate pain or headache.    . albuterol (PROVENTIL HFA;VENTOLIN HFA) 108 (90 Base) MCG/ACT inhaler Inhale 2 puffs into the lungs every 6 (six) hours as needed for wheezing or shortness of breath.    Marland Kitchen apixaban (ELIQUIS) 5 MG TABS tablet Take 1 tablet (5 mg total) by mouth 2 (two) times daily. 60 tablet 0  . cholestyramine (QUESTRAN) 4 g packet Take 1 packet (4 g total) by mouth 3 (three) times daily with meals. 60 each 12  . CINNAMON PO Take 1 tablet by mouth daily.    Marland Kitchen diltiazem (CARDIZEM CD) 300 MG 24 hr capsule Take 1 capsule (300 mg total) by mouth daily. (Patient taking  differently: Take 300 mg by mouth daily before breakfast. ) 90 capsule 3  . diphenoxylate-atropine (LOMOTIL) 2.5-0.025 MG tablet Take 1 tablet by mouth 4 (four) times daily as needed for diarrhea or loose stools. 120 tablet 3  . doxycycline (VIBRA-TABS) 100 MG tablet Take 1 tablet (100 mg total) by mouth 2 (two) times daily. 60 tablet 1  . glyBURIDE (DIABETA) 5 MG tablet Take 10 mg by mouth 2 (two)  times daily.  0  . insulin detemir (LEVEMIR) 100 UNIT/ML injection Inject 0.25 mLs (25 Units total) into the skin at bedtime. (Patient taking differently: Inject 14 Units into the skin at bedtime. ) 10 mL 0  . lidocaine-prilocaine (EMLA) cream Apply to affected area once 30 g 3  . LORazepam (ATIVAN) 0.5 MG tablet Take 1 tablet (0.5 mg total) by mouth at bedtime as needed for sleep. (Patient not taking: Reported on 07/26/2018) 30 tablet 0  . metFORMIN (GLUCOPHAGE) 500 MG tablet Take 1,000 mg by mouth 2 (two) times daily with a meal.     . niacin 500 MG tablet Take 500 mg by mouth daily.    . Omega-3 1000 MG CAPS Take 1,000 mg by mouth 2 (two) times daily.    . ondansetron (ZOFRAN) 8 MG tablet Take 1 tablet (8 mg total) by mouth 2 (two) times daily as needed (Nausea or vomiting). 30 tablet 1  . oxyCODONE (OXY IR/ROXICODONE) 5 MG immediate release tablet Take 1 tablet (5 mg total) by mouth every 6 (six) hours as needed for severe pain. 12 tablet 0  . pantoprazole (PROTONIX) 40 MG tablet Take 1 tablet (40 mg total) by mouth daily. 30 tablet 3  . potassium chloride SA (K-DUR) 20 MEQ tablet Take 1 tablet (20 mEq total) by mouth 2 (two) times daily. 30 tablet 3  . pravastatin (PRAVACHOL) 20 MG tablet Take 20 mg by mouth daily before breakfast.   0  . prochlorperazine (COMPAZINE) 10 MG tablet Take 1 tablet (10 mg total) by mouth every 6 (six) hours as needed (Nausea or vomiting). 30 tablet 1   No current facility-administered medications for this visit.    Facility-Administered Medications Ordered in Other Visits  Medication Dose Route Frequency Provider Last Rate Last Dose  . vancomycin (VANCOCIN) 1,000 mg in sodium chloride 0.9 % 250 mL IVPB  1,000 mg Intravenous Once Nicholas Lose, MD        PHYSICAL EXAMINATION: ECOG PERFORMANCE STATUS: 1 - Symptomatic but completely ambulatory  Vitals:   10/11/18 0950  BP: 106/60  Pulse: 69  Resp: 18  Temp: 97.9 F (36.6 C)  SpO2: 99%   Filed Weights     GENERAL: alert, no distress and comfortable SKIN: skin color, texture, turgor are normal, no rashes or significant lesions EYES: normal, Conjunctiva are pink and non-injected, sclera clear OROPHARYNX: no exudate, no erythema and lips, buccal mucosa, and tongue normal  NECK: supple, thyroid normal size, non-tender, without nodularity LYMPH: no palpable lymphadenopathy in the cervical, axillary or inguinal LUNGS: clear to auscultation and percussion with normal breathing effort HEART: regular rate & rhythm and no murmurs and no lower extremity edema ABDOMEN: abdomen soft, non-tender and normal bowel sounds MUSCULOSKELETAL: no cyanosis of digits and no clubbing  NEURO: alert & oriented x 3 with fluent speech, no focal motor/sensory deficits EXTREMITIES: No lower extremity edema  LABORATORY DATA:  I have reviewed the data as listed CMP Latest Ref Rng & Units 10/11/2018 10/04/2018 09/27/2018  Glucose 70 - 99 mg/dL 55(L) 53(L) 98  BUN 8 - 23 mg/dL 10 9 11   Creatinine 0.44 - 1.00 mg/dL 0.73 0.76 0.76  Sodium 135 - 145 mmol/L 139 141 140  Potassium 3.5 - 5.1 mmol/L 3.1(L) 3.2(L) 3.3(L)  Chloride 98 - 111 mmol/L 109 111 110  CO2 22 - 32 mmol/L 20(L) 20(L) 20(L)  Calcium 8.9 - 10.3 mg/dL 7.5(L) 7.6(L) 8.0(L)  Total Protein 6.5 - 8.1 g/dL 6.4(L) 6.2(L) 6.2(L)  Total Bilirubin 0.3 - 1.2 mg/dL 0.3 0.3 0.2(L)  Alkaline Phos 38 - 126 U/L 98 90 93  AST 15 - 41 U/L 15 18 26   ALT 0 - 44 U/L 14 18 36    Lab Results  Component Value Date   WBC 7.8 10/11/2018   HGB 9.7 (L) 10/11/2018   HCT 31.3 (L) 10/11/2018   MCV 85.1 10/11/2018   PLT 283 10/11/2018   NEUTROABS 6.7 10/11/2018    ASSESSMENT & PLAN:  Malignant neoplasm of upper-outer quadrant of left breast in female, estrogen receptor positive ([place]) 07/09/2018:Screening mammogram detected spiculated mass in the left breast upper outer quadrant, 2.6 cm by MRI, axilla negative, biopsy revealed grade 2 IDC with DCIS, ER 100%, PR 70%, Ki-67 10%,  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 Neoadjuvant chemotherapy withTaxol weekly x12 Herceptin Perjeta every 3 weeksfollowed by Herceptinand Perjeta versus Kadcylamaintenance for 1 year 2. Followed by breast conserving surgery if possible with sentinel lymph node study 3. Followed by adjuvant radiation therapy if patient had lumpectomy 4.Followed by adjuvant antiestrogen therapy ------------------------------------------------------------------------------------------------------------------------------------------------------------------- Current treatment: Completed 8 cycles of Taxol weekly with Herceptin and Perjeta every 3 weeks, Perjeta discontinued deviously due to diarrhea Echocardiogram 07/30/2018: EF 60 to 65%  Chemo toxicities: 1.Elevated blood sugars: hasn't been checking her sugars 2.Diarrhea:   will continue regimen with Lomotil, Questran, and Imodium 3.Dehydration:  Will get 500cc of normal saline today with treatment 4.Normocytic anemia:received IV iron given  genetic consultation done awaiting results  Refer to Dr. Haroldine Laws for cardiac monitoring while undergoing Herceptin therapy  Cellulitis of right lower extremity: Because of this we will discontinue further chemotherapy.  We will give her a dose of vancomycin today.  We will also renew her doxycycline. I sent a message to Dr. Brantley Stage to see her after post neoadjuvant breast MRI which will be scheduled for next week. We will present her case in the breast tumor board. We will remove her weekly Taxol appointments and she will get Herceptin once every 3 weeks.    Orders Placed This Encounter  Procedures  . MR BREAST BILATERAL W WO CONTRAST INC CAD    Standing Status:   Future    Standing Expiration Date:   12/11/2019    Order Specific Question:   ** REASON FOR EXAM (FREE TEXT)    Answer:   Post neo adj chemo    Order Specific Question:   If indicated for the ordered  procedure, I authorize the administration of contrast media per Radiology protocol    Answer:   Yes    Order Specific Question:   What is the patient's sedation requirement?    Answer:   No Sedation    Order Specific Question:   Does the patient have a pacemaker or implanted devices?    Answer:   No    Order Specific Question:   Radiology Contrast Protocol - do NOT remove file path    Answer:   [email or web address]    Order Specific Question:   Preferred imaging location?  Answer:   [facility] (table limit-550lbs)   The patient has a good understanding of the overall plan. she agrees with it. she will call with any problems that may develop before the next visit here.  Nicholas Lose, MD 10/11/2018  Julious Oka Dorshimer am acting as scribe for Dr. Nicholas Lose.  I have reviewed the above documentation for accuracy and completeness, and I agree with the above.

## 2018-10-11 ENCOUNTER — Inpatient Hospital Stay: Payer: Medicare Other

## 2018-10-11 ENCOUNTER — Other Ambulatory Visit: Payer: Self-pay

## 2018-10-11 ENCOUNTER — Encounter: Payer: Self-pay | Admitting: *Deleted

## 2018-10-11 ENCOUNTER — Inpatient Hospital Stay (HOSPITAL_BASED_OUTPATIENT_CLINIC_OR_DEPARTMENT_OTHER): Payer: Medicare Other | Admitting: Hematology and Oncology

## 2018-10-11 DIAGNOSIS — Z170 Estrogen receptor positive status [ER+]: Secondary | ICD-10-CM

## 2018-10-11 DIAGNOSIS — E860 Dehydration: Secondary | ICD-10-CM | POA: Diagnosis not present

## 2018-10-11 DIAGNOSIS — R531 Weakness: Secondary | ICD-10-CM | POA: Diagnosis not present

## 2018-10-11 DIAGNOSIS — D509 Iron deficiency anemia, unspecified: Secondary | ICD-10-CM | POA: Diagnosis not present

## 2018-10-11 DIAGNOSIS — Z5112 Encounter for antineoplastic immunotherapy: Secondary | ICD-10-CM | POA: Diagnosis not present

## 2018-10-11 DIAGNOSIS — C50412 Malignant neoplasm of upper-outer quadrant of left female breast: Secondary | ICD-10-CM

## 2018-10-11 DIAGNOSIS — Z95828 Presence of other vascular implants and grafts: Secondary | ICD-10-CM

## 2018-10-11 DIAGNOSIS — L03115 Cellulitis of right lower limb: Secondary | ICD-10-CM | POA: Diagnosis not present

## 2018-10-11 DIAGNOSIS — R197 Diarrhea, unspecified: Secondary | ICD-10-CM | POA: Diagnosis not present

## 2018-10-11 DIAGNOSIS — Z5111 Encounter for antineoplastic chemotherapy: Secondary | ICD-10-CM | POA: Diagnosis not present

## 2018-10-11 DIAGNOSIS — Z803 Family history of malignant neoplasm of breast: Secondary | ICD-10-CM

## 2018-10-11 LAB — CBC WITH DIFFERENTIAL (CANCER CENTER ONLY)
Abs Immature Granulocytes: 0.05 10*3/uL (ref 0.00–0.07)
Basophils Absolute: 0 10*3/uL (ref 0.0–0.1)
Basophils Relative: 0 %
Eosinophils Absolute: 0.1 10*3/uL (ref 0.0–0.5)
Eosinophils Relative: 1 %
HCT: 31.3 % — ABNORMAL LOW (ref 36.0–46.0)
Hemoglobin: 9.7 g/dL — ABNORMAL LOW (ref 12.0–15.0)
Immature Granulocytes: 1 %
Lymphocytes Relative: 7 %
Lymphs Abs: 0.6 10*3/uL — ABNORMAL LOW (ref 0.7–4.0)
MCH: 26.4 pg (ref 26.0–34.0)
MCHC: 31 g/dL (ref 30.0–36.0)
MCV: 85.1 fL (ref 80.0–100.0)
Monocytes Absolute: 0.4 10*3/uL (ref 0.1–1.0)
Monocytes Relative: 5 %
Neutro Abs: 6.7 10*3/uL (ref 1.7–7.7)
Neutrophils Relative %: 86 %
Platelet Count: 283 10*3/uL (ref 150–400)
RBC: 3.68 MIL/uL — ABNORMAL LOW (ref 3.87–5.11)
RDW: 17.6 % — ABNORMAL HIGH (ref 11.5–15.5)
WBC Count: 7.8 10*3/uL (ref 4.0–10.5)
nRBC: 0 % (ref 0.0–0.2)

## 2018-10-11 LAB — CMP (CANCER CENTER ONLY)
ALT: 14 U/L (ref 0–44)
AST: 15 U/L (ref 15–41)
Albumin: 2.8 g/dL — ABNORMAL LOW (ref 3.5–5.0)
Alkaline Phosphatase: 98 U/L (ref 38–126)
Anion gap: 10 (ref 5–15)
BUN: 10 mg/dL (ref 8–23)
CO2: 20 mmol/L — ABNORMAL LOW (ref 22–32)
Calcium: 7.5 mg/dL — ABNORMAL LOW (ref 8.9–10.3)
Chloride: 109 mmol/L (ref 98–111)
Creatinine: 0.73 mg/dL (ref 0.44–1.00)
GFR, Est AFR Am: >60 mL/min (ref >60)
GFR, Estimated: >60 mL/min (ref >60)
Glucose, Bld: 55 mg/dL — ABNORMAL LOW (ref 70–99)
Potassium: 3.1 mmol/L — ABNORMAL LOW (ref 3.5–5.1)
Sodium: 139 mmol/L (ref 135–145)
Total Bilirubin: 0.3 mg/dL (ref 0.3–1.2)
Total Protein: 6.4 g/dL — ABNORMAL LOW (ref 6.5–8.1)

## 2018-10-11 MED ORDER — ACETAMINOPHEN 325 MG PO TABS
ORAL_TABLET | ORAL | Status: AC
  Filled 2018-10-11: qty 2

## 2018-10-11 MED ORDER — VANCOMYCIN HCL 1000 MG IV SOLR
1000.0000 mg | Freq: Once | INTRAVENOUS | Status: AC
  Administered 2018-10-11: 1000 mg via INTRAVENOUS
  Filled 2018-10-11: qty 1000

## 2018-10-11 MED ORDER — SODIUM CHLORIDE 0.9% FLUSH
10.0000 mL | Freq: Once | INTRAVENOUS | Status: AC
  Administered 2018-10-11: 10 mL
  Filled 2018-10-11: qty 10

## 2018-10-11 MED ORDER — SODIUM CHLORIDE 0.9% FLUSH
10.0000 mL | INTRAVENOUS | Status: DC | PRN
  Administered 2018-10-11: 10 mL
  Filled 2018-10-11: qty 10

## 2018-10-11 MED ORDER — HEPARIN SOD (PORK) LOCK FLUSH 100 UNIT/ML IV SOLN
500.0000 [IU] | Freq: Once | INTRAVENOUS | Status: AC | PRN
  Administered 2018-10-11: 500 [IU]
  Filled 2018-10-11: qty 5

## 2018-10-11 MED ORDER — DOXYCYCLINE HYCLATE 100 MG PO TABS: 100.0000 mg | ORAL_TABLET | Freq: Two times a day (BID) | ORAL | 1 refills | Status: DC

## 2018-10-11 MED ORDER — DIPHENHYDRAMINE HCL 50 MG/ML IJ SOLN
25.0000 mg | Freq: Once | INTRAMUSCULAR | Status: AC
  Administered 2018-10-11: 25 mg via INTRAVENOUS

## 2018-10-11 MED ORDER — POTASSIUM CHLORIDE CRYS ER 20 MEQ PO TBCR: 20.0000 meq | EXTENDED_RELEASE_TABLET | Freq: Two times a day (BID) | ORAL | 3 refills | Status: DC

## 2018-10-11 MED ORDER — TRASTUZUMAB CHEMO 150 MG IV SOLR
750.0000 mg | Freq: Once | INTRAVENOUS | Status: AC
  Administered 2018-10-11: 750 mg via INTRAVENOUS
  Filled 2018-10-11: qty 14.29

## 2018-10-11 MED ORDER — ACETAMINOPHEN 325 MG PO TABS
650.0000 mg | ORAL_TABLET | Freq: Once | ORAL | Status: AC
  Administered 2018-10-11: 650 mg via ORAL

## 2018-10-11 MED ORDER — SODIUM CHLORIDE 0.9 % IV SOLN
Freq: Once | INTRAVENOUS | Status: AC
  Administered 2018-10-11: 11:00:00 via INTRAVENOUS
  Filled 2018-10-11: qty 250

## 2018-10-11 MED ORDER — DIPHENHYDRAMINE HCL 50 MG/ML IJ SOLN
INTRAMUSCULAR | Status: AC
  Filled 2018-10-11: qty 1

## 2018-10-11 NOTE — Assessment & Plan Note (Signed)
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 Neoadjuvant chemotherapy withTaxol weekly x12 Herceptin Perjeta every 3 weeksfollowed by Herceptinand Perjeta versus Kadcylamaintenance for 1 year 2. Followed by breast conserving surgery if possible with sentinel lymph node study 3. Followed by adjuvant radiation therapy if patient had lumpectomy 4.Followed by adjuvant antiestrogen therapy ------------------------------------------------------------------------------------------------------------------------------------------------------------------- Current treatment:Cycle9day 1 Taxol weekly with Herceptin and Perjeta every 3 weeks Echocardiogram 07/30/2018: EF 60 to 65%  Chemo toxicities: 1.Elevated blood sugars: hasn't been checking her sugars 2.Diarrhea: improved, will continue regimen with Lomotil, Questran, and Imodium 3.Dehydration:  Will get 500cc of normal saline today with treatment 4.Normocytic anemia:received IV iron given  genetic consultation done awaiting results  Refer to Dr. Haroldine Laws for cardiac monitoring while undergoing Herceptin therapy  Return to clinic weekly for chemoand every other week for follow-up.

## 2018-10-11 NOTE — Patient Instructions (Signed)
Trastuzumab injection for infusion What is this medicine? TRASTUZUMAB (tras TOO zoo mab) is a monoclonal antibody. It is used to treat breast cancer and stomach cancer. This medicine may be used for other purposes; ask your health care provider or pharmacist if you have questions. COMMON BRAND NAME(S): Herceptin, Calla Kicks, OGIVRI What should I tell my health care provider before I take this medicine? They need to know if you have any of these conditions: -heart disease -heart failure -lung or breathing disease, like asthma -an unusual or allergic reaction to trastuzumab, benzyl alcohol, or other medications, foods, dyes, or preservatives -pregnant or trying to get pregnant -breast-feeding How should I use this medicine? This drug is given as an infusion into a vein. It is administered in a hospital or clinic by a specially trained health care professional. Talk to your pediatrician regarding the use of this medicine in children. This medicine is not approved for use in children. Overdosage: If you think you have taken too much of this medicine contact a poison control center or emergency room at once. NOTE: This medicine is only for you. Do not share this medicine with others. What if I miss a dose? It is important not to miss a dose. Call your doctor or health care professional if you are unable to keep an appointment. What may interact with this medicine? This medicine may interact with the following medications: -certain types of chemotherapy, such as daunorubicin, doxorubicin, epirubicin, and idarubicin This list may not describe all possible interactions. Give your health care provider a list of all the medicines, herbs, non-prescription drugs, or dietary supplements you use. Also tell them if you smoke, drink alcohol, or use illegal drugs. Some items may interact with your medicine. What should I watch for while using this medicine? Visit your doctor for checks on your progress. Report  any side effects. Continue your course of treatment even though you feel ill unless your doctor tells you to stop. Call your doctor or health care professional for advice if you get a fever, chills or sore throat, or other symptoms of a cold or flu. Do not treat yourself. Try to avoid being around people who are sick. You may experience fever, chills and shaking during your first infusion. These effects are usually mild and can be treated with other medicines. Report any side effects during the infusion to your health care professional. Fever and chills usually do not happen with later infusions. Do not become pregnant while taking this medicine or for 7 months after stopping it. Women should inform their doctor if they wish to become pregnant or think they might be pregnant. Women of child-bearing potential will need to have a negative pregnancy test before starting this medicine. There is a potential for serious side effects to an unborn child. Talk to your health care professional or pharmacist for more information. Do not breast-feed an infant while taking this medicine or for 7 months after stopping it. Women must use effective birth control with this medicine. What side effects may I notice from receiving this medicine? Side effects that you should report to your doctor or health care professional as soon as possible: -allergic reactions like skin rash, itching or hives, swelling of the face, lips, or tongue -chest pain or palpitations -cough -dizziness -feeling faint or lightheaded, falls -fever -general ill feeling or flu-like symptoms -signs of worsening heart failure like breathing problems; swelling in your legs and feet -unusually weak or tired Side effects that usually do not  require medical attention (report to your doctor or health care professional if they continue or are bothersome): -bone pain -changes in taste -diarrhea -joint pain -nausea/vomiting -weight loss This list may  not describe all possible side effects. Call your doctor for medical advice about side effects. You may report side effects to FDA at 1-800-FDA-1088. Where should I keep my medicine? This drug is given in a hospital or clinic and will not be stored at home. NOTE: This sheet is a summary. It may not cover all possible information. If you have questions about this medicine, talk to your doctor, pharmacist, or health care provider.  2019 Elsevier/Gold Standard (2016-05-16 14:37:52)   Vancomycin injection What is this medicine? VANCOMYCIN Lucianne Lei koe MYE sin) is a glycopeptide antibiotic. It is used to treat certain kinds of bacterial infections. It will not work for colds, flu, or other viral infections. This medicine may be used for other purposes; ask your health care provider or pharmacist if you have questions. COMMON BRAND NAME(S): Glo Herring What should I tell my health care provider before I take this medicine? They need to know if you have any of these conditions: -dehydration -hearing loss -kidney disease -other chronic illness -an unusual or allergic reaction to vancomycin, other medicines, foods, dyes, or preservatives -pregnant or trying to get pregnant -breast-feeding How should I use this medicine? This medicine is infused into a vein. It is usually given by a health care provider in a hospital or clinic. If you receive this medicine at home, you will receive special instructions. Take your medicine at regular intervals. Do not take your medicine more often than directed. Take all of your medicine as directed even if you think you are better. Do not skip doses or stop your medicine early. It is important that you put your used needles and syringes in a special sharps container. Do not put them in a trash can. If you do not have a sharps container, call your pharmacist or healthcare provider to get one. Talk to your pediatrician regarding the use of this medicine in children.  While this drug may be prescribed for even very young infants for selected conditions, precautions do apply. Overdosage: If you think you have taken too much of this medicine contact a poison control center or emergency room at once. NOTE: This medicine is only for you. Do not share this medicine with others. What if I miss a dose? If you miss a dose, take it as soon as you can. If it is almost time for your next dose, take only that dose. Do not take double or extra doses. What may interact with this medicine? -amphotericin B -anesthetics -bacitracin -birth control pills -cisplatin -colistin -diuretics -other aminoglycoside antibiotics -polymyxin B This list may not describe all possible interactions. Give your health care provider a list of all the medicines, herbs, non-prescription drugs, or dietary supplements you use. Also tell them if you smoke, drink alcohol, or use illegal drugs. Some items may interact with your medicine. What should I watch for while using this medicine? Tell your doctor or health care professional if your symptoms do not improve or if you get new symptoms. Your condition and lab work will be monitored while you are taking this medicine. Do not treat diarrhea with over the counter products. Contact your doctor if you have diarrhea that lasts more than 2 days or if it is severe and watery. What side effects may I notice from receiving this medicine? Side effects that  you should report to your doctor or health care professional as soon as possible: -allergic reactions like skin rash, itching or hives, swelling of the face, lips, or tongue -breathing difficulty, wheezing -change in amount, color of urine -change in hearing -chest pain -dizziness -fever, chills -flushing of the face and neck (reddening) -low blood pressure -redness, blistering, peeling or loosening of the skin, including inside the mouth -unusual bleeding or bruising -unusually weak or tired  Side effects that usually do not require medical attention (report to your doctor or health care professional if they continue or are bothersome): -nausea, vomiting -pain, swelling where injected -stomach cramps This list may not describe all possible side effects. Call your doctor for medical advice about side effects. You may report side effects to FDA at 1-800-FDA-1088. Where should I keep my medicine? Keep out of the reach of children. You will be instructed on how to store this medicine, if needed. Throw away any unused medicine after the expiration date on the label. NOTE: This sheet is a summary. It may not cover all possible information. If you have questions about this medicine, talk to your doctor, pharmacist, or health care provider.  2019 Elsevier/Gold Standard (2012-12-27 14:46:02)   Cellulitis, Adult  Cellulitis is a skin infection. The infected area is often warm, red, swollen, and sore. It occurs most often in the arms and lower legs. It is very important to get treated for this condition. What are the causes? This condition is caused by bacteria. The bacteria enter through a break in the skin, such as a cut, burn, insect bite, open sore, or crack. What increases the risk? This condition is more likely to occur in people who:  Have a weak body defense system (immune system).  Have open cuts, burns, bites, or scrapes on the skin.  Are older than 71 years of age.  Have a blood sugar problem (diabetes).  Have a long-lasting (chronic) liver disease (cirrhosis) or kidney disease.  Are very overweight (obese).  Have a skin problem, such as: ? Itchy rash (eczema). ? Slow movement of blood in the veins (venous stasis). ? Fluid buildup below the skin (edema).  Have been treated with high-energy rays (radiation).  Use IV drugs. What are the signs or symptoms? Symptoms of this condition include:  Skin that is: ? Red. ? Streaking. ? Spotting. ? Swollen. ? Sore or  painful when you touch it. ? Warm.  A fever.  Chills.  Blisters. How is this diagnosed? This condition is diagnosed based on:  Medical history.  Physical exam.  Blood tests.  Imaging tests. How is this treated? Treatment for this condition may include:  Medicines to treat infections or allergies.  Home care, such as: ? Rest. ? Placing cold or warm cloths (compresses) on the skin.  Hospital care, if the condition is very bad. Follow these instructions at home: Medicines  Take over-the-counter and prescription medicines only as told by your doctor.  If you were prescribed an antibiotic medicine, take it as told by your doctor. Do not stop taking it even if you start to feel better. General instructions   Drink enough fluid to keep your pee (urine) pale yellow.  Do not touch or rub the infected area.  Raise (elevate) the infected area above the level of your heart while you are sitting or lying down.  Place cold or warm cloths on the area as told by your doctor.  Keep all follow-up visits as told by your doctor. This  is important. Contact a doctor if:  You have a fever.  You do not start to get better after 1-2 days of treatment.  Your bone or joint under the infected area starts to hurt after the skin has healed.  Your infection comes back. This can happen in the same area or another area.  You have a swollen bump in the area.  You have new symptoms.  You feel ill and have muscle aches and pains. Get help right away if:  Your symptoms get worse.  You feel very sleepy.  You throw up (vomit) or have watery poop (diarrhea) for a long time.  You see red streaks coming from the area.  Your red area gets larger.  Your red area turns dark in color. These symptoms may represent a serious problem that is an emergency. Do not wait to see if the symptoms will go away. Get medical help right away. Call your local emergency services (911 in the [place] Do not  drive yourself to the hospital. Summary  Cellulitis is a skin infection. The area is often warm, red, swollen, and sore.  This condition is treated with medicines, rest, and cold and warm cloths.  Take all medicines only as told by your doctor.  Tell your doctor if symptoms do not start to get better after 1-2 days of treatment. This information is not intended to replace advice given to you by your health care provider. Make sure you discuss any questions you have with your health care provider. Document Released: 11/08/2007 Document Revised: 10/11/2017 Document Reviewed: 10/11/2017 Elsevier Interactive Patient Education  2019 [employer].

## 2018-10-14 ENCOUNTER — Telehealth: Payer: Self-pay | Admitting: Hematology and Oncology

## 2018-10-14 ENCOUNTER — Telehealth: Payer: Self-pay | Admitting: Genetic Counselor

## 2018-10-14 ENCOUNTER — Encounter: Payer: Self-pay | Admitting: Genetic Counselor

## 2018-10-14 DIAGNOSIS — Z1379 Encounter for other screening for genetic and chromosomal anomalies: Secondary | ICD-10-CM | POA: Insufficient documentation

## 2018-10-14 NOTE — Telephone Encounter (Signed)
Tried to reach regarding schedule

## 2018-10-14 NOTE — Telephone Encounter (Signed)
LM on VM that results are back and to please CB.

## 2018-10-15 ENCOUNTER — Ambulatory Visit: Payer: Self-pay | Admitting: Genetic Counselor

## 2018-10-15 DIAGNOSIS — Z1379 Encounter for other screening for genetic and chromosomal anomalies: Secondary | ICD-10-CM

## 2018-10-15 NOTE — Progress Notes (Signed)
HPI:  Destiny Nash was previously seen in the [facility] clinic due to a personal and family history of breast cancer and concerns regarding a hereditary predisposition to cancer. Please refer to our prior cancer genetics clinic note for more information regarding our discussion, assessment and recommendations, at the time. Destiny Nash recent genetic test results were disclosed to her, as were recommendations warranted by these results. These results and recommendations are discussed in more detail below.  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  The Multi-Gene Panel offered by [facility] includes sequencing and/or deletion duplication testing of the following 85 genes: AIP, ALK, APC, ATM, AXIN2,BAP1,  BARD1, BLM, BMPR1A, BRCA1, BRCA2, BRIP1, CASR, CDC73, CDH1, CDK4, CDKN1B, CDKN1C, CDKN2A (p14ARF), CDKN2A (p16INK4a), CEBPA, CHEK2, CTNNA1, DICER1, DIS3L2, EGFR (c.2369C>T, p.Thr790Met variant only), EPCAM (Deletion/duplication testing only), FH, FLCN, GATA2, GPC3, GREM1 (Promoter region deletion/duplication testing only), HOXB13 (c.251G>A, p.Gly84Glu), HRAS, KIT, MAX, MEN1, MET, MITF (c.952G>A, p.Glu318Lys variant only), MLH1, MSH2, MSH3, MSH6, MUTYH,  NBN, NF1, NF2, NTHL1, PALB2, PDGFRA, PHOX2B, PMS2, POLD1, POLE, POT1, PRKAR1A, PTCH1, PTEN, RAD50, RAD51C, RAD51D, RB1, RECQL4, RET, RNF43, RUNX1, SDHAF2, SDHA (sequence changes only), SDHB, SDHC, SDHD, SMAD4, SMARCA4, SMARCB1, SMARCE1, STK11, SUFU, TERC, TERT, TMEM127, TP53, TSC1, TSC2, VHL, WRN and WT1.  The report date is Oct 13, 2018.     FAMILY HISTORY:  We obtained a detailed, 4-generation family history.  Significant diagnoses are listed below: Family History  Problem Relation Age of Onset   CAD Brother    Hypertension Mother    Hyperlipidemia Mother    Hypertension Brother    Uterine cancer Maternal Grandmother    Breast cancer Cousin        dx in her 53s; mat first cousin   Breast cancer Cousin        dx in her 45s-60s; mat first cousin    The patient has a son and daughter who are cancer free.  She has three maternal half brothers and a maternal half sister who are cancer free.  Both parents are deceased.  The biological father is unknown.  The patient's mother died at 44.  She had Charcot-Marie-Tooth.  She had two brothers and a sister who are all cancer free. Her sister has a daughter who had breast cancer in her 93's, a brother had a daughter who died of breast cancer in her 73's-60's, and the other brother has a daughter who had skin cancer.  The maternal grandparents are deceased.  The grandmother had uterine cancer.  Destiny Nash is unaware of previous family history of genetic testing for hereditary cancer risks. Patient's maternal ancestors are of [place] and [place] descent, and paternal ancestors are of unknown descent. There is  no reported Ashkenazi Jewish ancestry. There is no known consanguinity.    GENETIC TEST RESULTS: Genetic testing reported out on Oct 13, 2018 through the multi-cancer panel found no pathogenic mutations. The Multi-Gene Panel offered by [facility] includes sequencing and/or deletion duplication testing of the following 85 genes: AIP, ALK,  APC, ATM, AXIN2,BAP1,  BARD1, BLM, BMPR1A, BRCA1, BRCA2, BRIP1, CASR, CDC73, CDH1, CDK4, CDKN1B, CDKN1C, CDKN2A (p14ARF), CDKN2A (p16INK4a), CEBPA, CHEK2, CTNNA1, DICER1, DIS3L2, EGFR (c.2369C>T, p.Thr790Met variant only), EPCAM (Deletion/duplication testing only), FH, FLCN, GATA2, GPC3, GREM1 (Promoter region deletion/duplication testing only), HOXB13 (c.251G>A, p.Gly84Glu), HRAS, KIT, MAX, MEN1, MET, MITF (c.952G>A, p.Glu318Lys variant only), MLH1, MSH2, MSH3, MSH6, MUTYH, NBN, NF1, NF2, NTHL1, PALB2, PDGFRA, PHOX2B, PMS2, POLD1, POLE, POT1, PRKAR1A, PTCH1, PTEN, RAD50, RAD51C, RAD51D, RB1, RECQL4, RET, RNF43, RUNX1, SDHAF2, SDHA (sequence changes only), SDHB, SDHC, SDHD, SMAD4, SMARCA4, SMARCB1, SMARCE1, STK11, SUFU, TERC, TERT, TMEM127, TP53, TSC1, TSC2, VHL, WRN and WT1.  The test report has been scanned into EPIC and is located under the Molecular Pathology section of the Results Review tab.  A portion of the result report is included below for reference.     We discussed with Destiny Nash that because current genetic testing is not perfect, it is possible there may be a gene mutation in one of these genes that current testing cannot detect, but that chance is small.  We also discussed, that there could be another gene that has not yet been discovered, or that we have not yet tested, that is responsible for the cancer diagnoses in the family. It is also possible there is a hereditary cause for the cancer in the family that Destiny Nash did not inherit and therefore was not identified in her testing.  Therefore, it is important to remain in touch with cancer genetics in the future so that we can continue to offer Destiny Nash the most up to date genetic testing.   ADDITIONAL GENETIC TESTING: We discussed with Destiny Nash that there are other genes that are associated with increased cancer risk that can be analyzed. Should Destiny Nash wish to pursue additional genetic testing, we are happy to discuss and  coordinate this testing, at any time.    CANCER SCREENING RECOMMENDATIONS: Destiny Nash test result is considered negative (normal).  This means that we have not identified a hereditary cause for her personal and family history of cancer at this time. Most cancers happen by chance and this negative test suggests that her cancer may fall into this category.    While reassuring, this does not definitively rule out a hereditary predisposition to cancer. It is still possible that there could be genetic mutations that are undetectable by current technology. There could be genetic mutations in genes that have not been tested or identified to increase cancer risk.  Therefore, it is recommended she continue to follow the cancer management and screening guidelines provided by her oncology and primary healthcare provider.   An individual's cancer risk and medical management are not determined by genetic test results alone. Overall cancer risk assessment incorporates additional factors, including personal medical history, family history, and any available genetic information that may result in a personalized plan for cancer prevention and surveillance  RECOMMENDATIONS FOR FAMILY MEMBERS:  Individuals in this family might be at some increased risk of developing cancer, over the general population risk, simply due to the family history of cancer.  We recommended women in this family have a yearly mammogram beginning at age 65, or 24 years  younger than the earliest onset of cancer, an annual clinical breast exam, and perform monthly breast self-exams. Women in this family should also have a gynecological exam as recommended by their primary provider. All family members should have a colonoscopy by age 38.  It is also possible there is a hereditary cause for the cancer in Destiny Nash's family that she did not inherit and therefore was not identified in her.  Based on Destiny Nash's family history, we recommended her cousin,  Rosann Auerbach, who was diagnosed with breast cancer in her 51's, have genetic counseling and testing. Destiny Nash will let us know if we can be of any assistance in coordinating genetic counseling and/or testing for this family member.   FOLLOW-UP: Lastly, we discussed with Destiny Nash that cancer genetics is a rapidly advancing field and it is possible that new genetic tests will be appropriate for her and/or her family members in the future. We encouraged her to remain in contact with cancer genetics on an annual basis so we can update her personal and family histories and let her know of advances in cancer genetics that may benefit this family.   Our contact number was provided. Destiny Nash questions were answered to her satisfaction, and she knows she is welcome to call us at anytime with additional questions or concerns.   Roma Kayser, MS, [facility] Certified Genetic Counselor [email or web address]

## 2018-10-15 NOTE — Telephone Encounter (Signed)
Revealed negative genetic testing.  Discussed that we do not know why she has breast cancer or why there is cancer in the family. It could be due to a different gene that we are not testing, or maybe our current technology may not be able to pick something up.  It will be important for her to keep in contact with genetics to keep up with whether additional testing may be needed. 

## 2018-10-17 ENCOUNTER — Other Ambulatory Visit: Payer: Medicare Other

## 2018-10-17 DIAGNOSIS — I10 Essential (primary) hypertension: Secondary | ICD-10-CM | POA: Diagnosis not present

## 2018-10-17 DIAGNOSIS — E1165 Type 2 diabetes mellitus with hyperglycemia: Secondary | ICD-10-CM | POA: Diagnosis not present

## 2018-10-17 DIAGNOSIS — E7800 Pure hypercholesterolemia, unspecified: Secondary | ICD-10-CM | POA: Diagnosis not present

## 2018-10-17 DIAGNOSIS — I4819 Other persistent atrial fibrillation: Secondary | ICD-10-CM | POA: Diagnosis not present

## 2018-10-17 DIAGNOSIS — C50912 Malignant neoplasm of unspecified site of left female breast: Secondary | ICD-10-CM | POA: Diagnosis not present

## 2018-10-17 DIAGNOSIS — N183 Chronic kidney disease, stage 3 (moderate): Secondary | ICD-10-CM | POA: Diagnosis not present

## 2018-10-17 DIAGNOSIS — Z7984 Long term (current) use of oral hypoglycemic drugs: Secondary | ICD-10-CM | POA: Diagnosis not present

## 2018-10-18 ENCOUNTER — Other Ambulatory Visit: Payer: Self-pay

## 2018-10-18 ENCOUNTER — Ambulatory Visit
Admission: RE | Admit: 2018-10-18 | Discharge: 2018-10-18 | Disposition: A | Discharge status: Home / Self Care | Payer: Medicare Other | Source: Ambulatory Visit | Attending: Hematology and Oncology | Admitting: Hematology and Oncology

## 2018-10-18 ENCOUNTER — Ambulatory Visit: Payer: Medicare Other

## 2018-10-18 DIAGNOSIS — C50412 Malignant neoplasm of upper-outer quadrant of left female breast: Secondary | ICD-10-CM | POA: Diagnosis not present

## 2018-10-18 DIAGNOSIS — Z170 Estrogen receptor positive status [ER+]: Secondary | ICD-10-CM

## 2018-10-18 MED ORDER — GADOBUTROL 1 MMOL/ML IV SOLN
10.0000 mL | Freq: Once | INTRAVENOUS | Status: AC | PRN
  Administered 2018-10-18: 10 mL via INTRAVENOUS

## 2018-10-20 ENCOUNTER — Other Ambulatory Visit: Payer: Self-pay

## 2018-10-20 ENCOUNTER — Encounter (HOSPITAL_COMMUNITY): Payer: Self-pay | Admitting: *Deleted

## 2018-10-20 ENCOUNTER — Observation Stay (HOSPITAL_COMMUNITY)
Admission: EM | Admit: 2018-10-20 | Discharge: 2018-10-21 | Disposition: A | Discharge status: Home / Self Care | Payer: Medicare Other | Attending: Internal Medicine | Admitting: Internal Medicine

## 2018-10-20 ENCOUNTER — Emergency Department (HOSPITAL_COMMUNITY): Payer: Medicare Other

## 2018-10-20 DIAGNOSIS — Z170 Estrogen receptor positive status [ER+]: Secondary | ICD-10-CM | POA: Diagnosis not present

## 2018-10-20 DIAGNOSIS — Z881 Allergy status to other antibiotic agents status: Secondary | ICD-10-CM | POA: Insufficient documentation

## 2018-10-20 DIAGNOSIS — E162 Hypoglycemia, unspecified: Secondary | ICD-10-CM | POA: Diagnosis not present

## 2018-10-20 DIAGNOSIS — Z8249 Family history of ischemic heart disease and other diseases of the circulatory system: Secondary | ICD-10-CM | POA: Insufficient documentation

## 2018-10-20 DIAGNOSIS — Z8049 Family history of malignant neoplasm of other genital organs: Secondary | ICD-10-CM | POA: Diagnosis not present

## 2018-10-20 DIAGNOSIS — D649 Anemia, unspecified: Secondary | ICD-10-CM | POA: Insufficient documentation

## 2018-10-20 DIAGNOSIS — I10 Essential (primary) hypertension: Secondary | ICD-10-CM | POA: Diagnosis not present

## 2018-10-20 DIAGNOSIS — E119 Type 2 diabetes mellitus without complications: Secondary | ICD-10-CM

## 2018-10-20 DIAGNOSIS — Z79899 Other long term (current) drug therapy: Secondary | ICD-10-CM | POA: Diagnosis not present

## 2018-10-20 DIAGNOSIS — Z803 Family history of malignant neoplasm of breast: Secondary | ICD-10-CM | POA: Diagnosis not present

## 2018-10-20 DIAGNOSIS — Z20828 Contact with and (suspected) exposure to other viral communicable diseases: Secondary | ICD-10-CM | POA: Diagnosis not present

## 2018-10-20 DIAGNOSIS — E11649 Type 2 diabetes mellitus with hypoglycemia without coma: Principal | ICD-10-CM | POA: Insufficient documentation

## 2018-10-20 DIAGNOSIS — C50412 Malignant neoplasm of upper-outer quadrant of left female breast: Secondary | ICD-10-CM | POA: Diagnosis not present

## 2018-10-20 DIAGNOSIS — Z6841 Body Mass Index (BMI) 40.0 and over, adult: Secondary | ICD-10-CM | POA: Insufficient documentation

## 2018-10-20 DIAGNOSIS — L03115 Cellulitis of right lower limb: Secondary | ICD-10-CM | POA: Diagnosis not present

## 2018-10-20 DIAGNOSIS — Z1159 Encounter for screening for other viral diseases: Secondary | ICD-10-CM | POA: Diagnosis not present

## 2018-10-20 DIAGNOSIS — I4891 Unspecified atrial fibrillation: Secondary | ICD-10-CM | POA: Diagnosis present

## 2018-10-20 DIAGNOSIS — E785 Hyperlipidemia, unspecified: Secondary | ICD-10-CM | POA: Insufficient documentation

## 2018-10-20 DIAGNOSIS — Z885 Allergy status to narcotic agent status: Secondary | ICD-10-CM | POA: Diagnosis not present

## 2018-10-20 DIAGNOSIS — R531 Weakness: Secondary | ICD-10-CM | POA: Diagnosis not present

## 2018-10-20 DIAGNOSIS — Z794 Long term (current) use of insulin: Secondary | ICD-10-CM

## 2018-10-20 DIAGNOSIS — R0902 Hypoxemia: Secondary | ICD-10-CM | POA: Diagnosis not present

## 2018-10-20 DIAGNOSIS — Z7901 Long term (current) use of anticoagulants: Secondary | ICD-10-CM | POA: Diagnosis not present

## 2018-10-20 DIAGNOSIS — E161 Other hypoglycemia: Secondary | ICD-10-CM | POA: Diagnosis not present

## 2018-10-20 DIAGNOSIS — R000 Tachycardia, unspecified: Secondary | ICD-10-CM | POA: Diagnosis not present

## 2018-10-20 DIAGNOSIS — I482 Chronic atrial fibrillation, unspecified: Secondary | ICD-10-CM | POA: Insufficient documentation

## 2018-10-20 DIAGNOSIS — R4781 Slurred speech: Secondary | ICD-10-CM | POA: Diagnosis not present

## 2018-10-20 DIAGNOSIS — L03119 Cellulitis of unspecified part of limb: Secondary | ICD-10-CM | POA: Diagnosis present

## 2018-10-20 LAB — CBC
HCT: 32.5 % — ABNORMAL LOW (ref 36.0–46.0)
Hemoglobin: 10 g/dL — ABNORMAL LOW (ref 12.0–15.0)
MCH: 26.5 pg (ref 26.0–34.0)
MCHC: 30.8 g/dL (ref 30.0–36.0)
MCV: 86.2 fL (ref 80.0–100.0)
Platelets: 235 10*3/uL (ref 150–400)
RBC: 3.77 MIL/uL — ABNORMAL LOW (ref 3.87–5.11)
RDW: 17.3 % — ABNORMAL HIGH (ref 11.5–15.5)
WBC: 13.2 10*3/uL — ABNORMAL HIGH (ref 4.0–10.5)
nRBC: 0 % (ref 0.0–0.2)

## 2018-10-20 LAB — COMPREHENSIVE METABOLIC PANEL
ALT: 18 U/L (ref 0–44)
AST: 25 U/L (ref 15–41)
Albumin: 2.8 g/dL — ABNORMAL LOW (ref 3.5–5.0)
Alkaline Phosphatase: 109 U/L (ref 38–126)
Anion gap: 9 (ref 5–15)
BUN: 16 mg/dL (ref 8–23)
CO2: 19 mmol/L — ABNORMAL LOW (ref 22–32)
Calcium: 7.7 mg/dL — ABNORMAL LOW (ref 8.9–10.3)
Chloride: 112 mmol/L — ABNORMAL HIGH (ref 98–111)
Creatinine, Ser: 0.77 mg/dL (ref 0.44–1.00)
GFR calc Af Amer: >60 mL/min (ref >60)
GFR calc non Af Amer: >60 mL/min (ref >60)
Glucose, Bld: 161 mg/dL — ABNORMAL HIGH (ref 70–99)
Potassium: 4.2 mmol/L (ref 3.5–5.1)
Sodium: 140 mmol/L (ref 135–145)
Total Bilirubin: 0.5 mg/dL (ref 0.3–1.2)
Total Protein: 5.9 g/dL — ABNORMAL LOW (ref 6.5–8.1)

## 2018-10-20 LAB — DIFFERENTIAL
Abs Immature Granulocytes: 0.16 10*3/uL — ABNORMAL HIGH (ref 0.00–0.07)
Basophils Absolute: 0.1 10*3/uL (ref 0.0–0.1)
Basophils Relative: 1 %
Eosinophils Absolute: 0.2 10*3/uL (ref 0.0–0.5)
Eosinophils Relative: 2 %
Immature Granulocytes: 1 %
Lymphocytes Relative: 7 %
Lymphs Abs: 0.9 10*3/uL (ref 0.7–4.0)
Monocytes Absolute: 0.9 10*3/uL (ref 0.1–1.0)
Monocytes Relative: 7 %
Neutro Abs: 10.9 10*3/uL — ABNORMAL HIGH (ref 1.7–7.7)
Neutrophils Relative %: 82 %

## 2018-10-20 LAB — ETHANOL: Alcohol, Ethyl (B): <10 mg/dL (ref <10)

## 2018-10-20 LAB — CBG MONITORING, ED
Glucose-Capillary: 40 mg/dL — CL (ref 70–99)
Glucose-Capillary: 78 mg/dL (ref 70–99)
Glucose-Capillary: 81 mg/dL (ref 70–99)
Glucose-Capillary: 76 mg/dL (ref 70–99)
Glucose-Capillary: 64 mg/dL — ABNORMAL LOW (ref 70–99)
Glucose-Capillary: 71 mg/dL (ref 70–99)

## 2018-10-20 LAB — PROTIME-INR
INR: 1.8 — ABNORMAL HIGH (ref 0.8–1.2)
Prothrombin Time: 20.8 seconds — ABNORMAL HIGH (ref 11.4–15.2)

## 2018-10-20 LAB — SARS CORONAVIRUS 2 BY RT PCR (HOSPITAL ORDER, PERFORMED IN ~~LOC~~ HOSPITAL LAB): SARS Coronavirus 2: NEGATIVE

## 2018-10-20 LAB — APTT: aPTT: 35 seconds (ref 24–36)

## 2018-10-20 MED ORDER — DEXTROSE 50 % IV SOLN
1.0000 | Freq: Once | INTRAVENOUS | Status: AC
  Administered 2018-10-20: 19:00:00 50 mL via INTRAVENOUS

## 2018-10-20 MED ORDER — DEXTROSE 50 % IV SOLN
INTRAVENOUS | Status: AC
  Administered 2018-10-20: 19:00:00 50 mL via INTRAVENOUS
  Filled 2018-10-20: qty 50

## 2018-10-20 MED ORDER — ACETAMINOPHEN 325 MG PO TABS
650.0000 mg | ORAL_TABLET | Freq: Four times a day (QID) | ORAL | Status: DC | PRN
  Administered 2018-10-21: 02:00:00 650 mg via ORAL
  Filled 2018-10-20: qty 2

## 2018-10-20 MED ORDER — ONDANSETRON HCL 4 MG PO TABS: 4.0000 mg | ORAL_TABLET | Freq: Four times a day (QID) | ORAL | Status: DC | PRN

## 2018-10-20 MED ORDER — ACETAMINOPHEN 650 MG RE SUPP: 650.0000 mg | Freq: Four times a day (QID) | RECTAL | Status: DC | PRN

## 2018-10-20 MED ORDER — DEXTROSE 10 % IV SOLN
Freq: Once | INTRAVENOUS | Status: AC
  Administered 2018-10-20: 23:00:00 via INTRAVENOUS

## 2018-10-20 MED ORDER — ONDANSETRON HCL 4 MG/2ML IJ SOLN: 4.0000 mg | Freq: Four times a day (QID) | INTRAMUSCULAR | Status: DC | PRN

## 2018-10-20 NOTE — ED Notes (Signed)
Pt to CT

## 2018-10-20 NOTE — ED Notes (Signed)
cbg 40, Dr Tomi Bamberger notified, additional orders given,

## 2018-10-20 NOTE — ED Triage Notes (Signed)
Pt states that around 4:00 today she noticed that she was having slurred speech and weakness. Upon arrival to er pt alert, able to answer questions, denies any pain, Dr Tomi Bamberger at bedside, speech is slightly slurred, grips equal,

## 2018-10-20 NOTE — ED Provider Notes (Signed)
[facility] EMERGENCY DEPARTMENT Provider Note   CSN: [id number] Arrival date & time: 10/20/18  1916    History   Chief Complaint Chief Complaint  Patient presents with  . Altered Mental Status    HPI Destiny Nash is a 73 y.o. female.     HPI Patient presents to the emergency room for evaluation of overall weakness and numbness.  Patient states the symptoms started around  4p.  She felt weak and numb all over and could not stand up.  He also felt that her speech was somewhat slurred.  She eventually called EMS.  EMS thought her symptoms could be a possible stroke.  They did not check a CBG initially.  Patient denies any fevers or chills.  She feels like her symptoms are starting to get better.  She denies any vomiting or diarrhea.  No headache.  Patient is currently on antibiotics for a cellulitis. Past Medical History:  Diagnosis Date  . Anemia    in the past   . Atrial fibrillation ([place])    a. diagnosed in 02/2018.  . Bronchitis    easily  develops bronchitis when she has a cold  . Cancer ([place])    L breast - CA, Feb. 4th   . Cellulitis   . Diabetes mellitus without complication ([place])   . Dysrhythmia    afib, treating with Eliquis  . Family history of breast cancer   . Family history of uterine cancer   . Heart murmur   . Hyperlipidemia   . Hypertension     Patient Active Problem List   Diagnosis Date Noted  . Hypoglycemia 10/20/2018  . Genetic testing 10/14/2018  . Family history of breast cancer   . Family history of uterine cancer   . Port-A-Cath in place 08/09/2018  . Malignant neoplasm of upper-outer quadrant of left breast in female, estrogen receptor positive ([place]) 07/25/2018  . Unspecified atrial fibrillation ([place]) 02/14/2018  . Cellulitis, leg 02/13/2018  . Uncontrolled type 2 diabetes mellitus with hyperglycemia, with long-term current use of insulin ([place]) 02/13/2018  . Essential hypertension 02/13/2018  . Hyperlipidemia 02/13/2018  . Obesity,  Class III, BMI 40-49.9 (morbid obesity) ([place]) 02/13/2018    Past Surgical History:  Procedure Laterality Date  . DILATION AND CURETTAGE OF UTERUS     x2 on same day  . PORTACATH PLACEMENT N/A 08/01/2018   Procedure: INSERTION PORT-A-CATH WITH ULTRASOUND;  Surgeon: Erroll Luna, MD;  Location: [facility];  Service: General;  Laterality: N/A;  . TONSILLECTOMY AND ADENOIDECTOMY       OB History   No obstetric history on file.      Home Medications    Prior to Admission medications   Medication Sig Start Date End Date Taking? Authorizing Provider  acetaminophen (TYLENOL) 500 MG tablet Take 1,000 mg by mouth every 6 (six) hours as needed for moderate pain or headache.    [provider]  albuterol (PROVENTIL HFA;VENTOLIN HFA) 108 (90 Base) MCG/ACT inhaler Inhale 2 puffs into the lungs every 6 (six) hours as needed for wheezing or shortness of breath.    [provider]  apixaban (ELIQUIS) 5 MG TABS tablet Take 1 tablet (5 mg total) by mouth 2 (two) times daily. 02/15/18   Orson Eva, MD  cholestyramine Lucrezia Starch) 4 g packet Take 1 packet (4 g total) by mouth 3 (three) times daily with meals. 09/06/18   Nicholas Lose, MD  CINNAMON PO Take 1 tablet by mouth daily.    [provider]  diltiazem (CARDIZEM CD) 300 MG 24 hr capsule Take 1 capsule (300 mg total) by mouth daily. Patient taking differently: Take 300 mg by mouth daily before breakfast.  07/01/18   Arnoldo Lenis, MD  diphenoxylate-atropine (LOMOTIL) 2.5-0.025 MG tablet Take 1 tablet by mouth 4 (four) times daily as needed for diarrhea or loose stools. 09/06/18   Nicholas Lose, MD  doxycycline (VIBRA-TABS) 100 MG tablet Take 1 tablet (100 mg total) by mouth 2 (two) times daily. 10/11/18   Nicholas Lose, MD  glyBURIDE (DIABETA) 5 MG tablet Take 10 mg by mouth 2 (two) times daily. 01/22/18   [provider]  insulin detemir (LEVEMIR) 100 UNIT/ML injection Inject 0.25 mLs (25 Units total) into the skin at  bedtime. Patient taking differently: Inject 14 Units into the skin at bedtime.  02/15/18   Orson Eva, MD  lidocaine-prilocaine (EMLA) cream Apply to affected area once 07/25/18   Nicholas Lose, MD  LORazepam (ATIVAN) 0.5 MG tablet Take 1 tablet (0.5 mg total) by mouth at bedtime as needed for sleep. Patient not taking: Reported on 07/26/2018 07/25/18   Nicholas Lose, MD  metFORMIN (GLUCOPHAGE) 500 MG tablet Take 1,000 mg by mouth 2 (two) times daily with a meal.     [provider]  niacin 500 MG tablet Take 500 mg by mouth daily.    [provider]  Omega-3 1000 MG CAPS Take 1,000 mg by mouth 2 (two) times daily.    [provider]  ondansetron (ZOFRAN) 8 MG tablet Take 1 tablet (8 mg total) by mouth 2 (two) times daily as needed (Nausea or vomiting). 07/25/18   Nicholas Lose, MD  oxyCODONE (OXY IR/ROXICODONE) 5 MG immediate release tablet Take 1 tablet (5 mg total) by mouth every 6 (six) hours as needed for severe pain. 08/01/18   Cornett, Marcello Moores, MD  pantoprazole (PROTONIX) 40 MG tablet Take 1 tablet (40 mg total) by mouth daily. 09/06/18   Nicholas Lose, MD  potassium chloride SA (K-DUR) 20 MEQ tablet Take 1 tablet (20 mEq total) by mouth 2 (two) times daily. 10/11/18   Nicholas Lose, MD  pravastatin (PRAVACHOL) 20 MG tablet Take 20 mg by mouth daily before breakfast.  01/03/18   [provider]  prochlorperazine (COMPAZINE) 10 MG tablet Take 1 tablet (10 mg total) by mouth every 6 (six) hours as needed (Nausea or vomiting). 07/25/18   Nicholas Lose, MD    Family History Family History  Problem Relation Age of Onset  . CAD Brother   . Hypertension Mother   . Hyperlipidemia Mother   . Hypertension Brother   . Uterine cancer Maternal Grandmother   . Breast cancer Cousin        dx in her 51s; mat first cousin  . Breast cancer Cousin        dx in her 72s-60s; mat first cousin    Social History Social History   Tobacco Use  . Smoking status: Never Smoker  .  Smokeless tobacco: Never Used  Substance Use Topics  . Alcohol use: Not Currently  . Drug use: Never     Allergies   Levaquin [levofloxacin] and Codeine   Review of Systems Review of Systems  All other systems reviewed and are negative.    Physical Exam Updated Vital Signs BP 140/78   Pulse (!) 108   Temp 98.1 F (36.7 C) (Oral)   Resp 20   Ht 1.676 m (5' 6" )   Wt 120.2 kg  SpO2 96%   BMI 42.77 kg/m   Physical Exam Vitals signs and nursing note reviewed.  Constitutional:      General: She is not in acute distress.    Appearance: She is well-developed.  HENT:     Head: Normocephalic and atraumatic.     Right Ear: External ear normal.     Left Ear: External ear normal.  Eyes:     General: No scleral icterus.       Right eye: No discharge.        Left eye: No discharge.     Conjunctiva/sclera: Conjunctivae normal.  Neck:     Musculoskeletal: Neck supple.     Trachea: No tracheal deviation.  Cardiovascular:     Rate and Rhythm: Normal rate and regular rhythm.  Pulmonary:     Effort: Pulmonary effort is normal. No respiratory distress.     Breath sounds: Normal breath sounds. No stridor. No wheezing or rales.  Abdominal:     General: Bowel sounds are normal. There is no distension.     Palpations: Abdomen is soft.     Tenderness: There is no abdominal tenderness. There is no guarding or rebound.  Musculoskeletal:        General: Tenderness present.     Right lower leg: Edema present.     Left lower leg: Edema present.     Comments: Erythema lower extremities  Skin:    General: Skin is warm and dry.     Findings: No rash.  Neurological:     Mental Status: She is alert and oriented to person, place, and time.     Cranial Nerves: No cranial nerve deficit (No facial droop, extraocular movements intact, tongue midline ).     Sensory: No sensory deficit.     Motor: No abnormal muscle tone or seizure activity.     Coordination: Coordination normal.      Comments: No pronator drift bilateral upper extrem, able to hold both legs off bed for 5 seconds, sensation intact in all extremities, no visual field cuts, no left or right sided neglect, normal finger-nose exam bilaterally, no nystagmus noted       ED Treatments / Results  Labs (all labs ordered are listed, but only abnormal results are displayed) Labs Reviewed  PROTIME-INR - Abnormal; Notable for the following components:      Result Value   Prothrombin Time 20.8 (*)    INR 1.8 (*)    All other components within normal limits  CBC - Abnormal; Notable for the following components:   WBC 13.2 (*)    RBC 3.77 (*)    Hemoglobin 10.0 (*)    HCT 32.5 (*)    RDW 17.3 (*)    All other components within normal limits  DIFFERENTIAL - Abnormal; Notable for the following components:   Neutro Abs 10.9 (*)    Abs Immature Granulocytes 0.16 (*)    All other components within normal limits  COMPREHENSIVE METABOLIC PANEL - Abnormal; Notable for the following components:   Chloride 112 (*)    CO2 19 (*)    Glucose, Bld 161 (*)    Calcium 7.7 (*)    Total Protein 5.9 (*)    Albumin 2.8 (*)    All other components within normal limits  CBG MONITORING, ED - Abnormal; Notable for the following components:   Glucose-Capillary 40 (*)    All other components within normal limits  CBG MONITORING, ED - Abnormal; Notable for the following  components:   Glucose-Capillary 64 (*)    All other components within normal limits  SARS CORONAVIRUS 2 (HOSPITAL ORDER, [facility])  ETHANOL  APTT  URINALYSIS, ROUTINE W REFLEX MICROSCOPIC  I-STAT CHEM 8, ED  CBG MONITORING, ED  CBG MONITORING, ED  CBG MONITORING, ED    EKG EKG Interpretation  Date/Time:  Sunday Oct 20 2018 19:38:54 EDT Ventricular Rate:  112 PR Interval:    QRS Duration: 100 QT Interval:  342 QTC Calculation: 444 R Axis:   89 Text Interpretation:  Atrial fibrillation Ventricular premature complex  Anterior infarct, old Borderline repolarization abnormality No significant change since last tracing Confirmed by Dorie Rank [phone]) on 10/20/2018 7:42:32 PM   Radiology Ct Head Wo Contrast  Result Date: 10/20/2018 CLINICAL DATA:  73 year old female with slurred speech and weakness onset earlier today. EXAM: CT HEAD WITHOUT CONTRAST TECHNIQUE: Contiguous axial images were obtained from the base of the skull through the vertex without intravenous contrast. COMPARISON:  None. FINDINGS: Brain: No midline shift, mass effect, or evidence of intracranial mass lesion. No acute intracranial hemorrhage identified. No ventriculomegaly. Patchy cerebral white matter hypodensity is mild for age. Deep gray matter nuclei and brainstem are within normal limits. Small hypodense area in the left cerebellum on series 2, image 9 is probably chronic. No superimposed No cortically based acute infarct identified. No cerebral cortical encephalomalacia identified. Vascular: Calcified atherosclerosis at the skull base. No suspicious intracranial vascular hyperdensity. Skull: No acute osseous abnormality identified. Sinuses/Orbits: Visualized paranasal sinuses and mastoids are stable and well pneumatized. Other: Visualized orbits and scalp soft tissues are within normal limits. IMPRESSION: 1. 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 2. No acute cortically based infarct or acute intracranial hemorrhage identified. Electronically Signed   By: Genevie Ann M.D.   On: 10/20/2018 20:11    Procedures Procedures (including critical care time)  Medications Ordered in ED Medications  dextrose 10 % infusion (has no administration in time range)  dextrose 50 % solution 50 mL (50 mLs Intravenous Given 10/20/18 1925)     Initial Impression / Assessment and Plan / ED Course  I have reviewed the triage vital signs and the nursing notes.  Pertinent labs & imaging results that were available during my  care of the patient were reviewed by me and considered in my medical decision making (see chart for details).  Clinical Course as of Oct 19 2240  Sun Oct 20, 2018  1925 Patient's initial CBG was 40.  Her symptoms are global and not consistent with a focal TIA stroke.  I will not activate a code stroke at this time but we will proceed with further evaluation.  Patient will be given an amp of D50.   [JK]  2026 CT scan without acute finding.   [JK]  2026 Hemoglobin is stable.  White blood cell count elevated compared to previous   [JK]    Clinical Course User Index [JK] Dorie Rank, MD     Patient presented to the emergency room for evaluation of weakness.  EMS was called.  Initially the concern was for possible TIA stroke.  However the patient's symptoms were generalized and there were no focal deficits.  She was noted to have a blood sugar of 40 on arrival.  Patient's hypoglycemia was clearly causing her weakness.  Patient did improve with glucose replacement.  The plan was to monitor in the ED to see if she had any recurrent  hypoglycemia.  Patient's blood sugar started to trend downward again.  She has not been eating or drinking was well with her chemotherapy she is on oral hypoglycemics.  I think it would be best to bring her into the hospital for admission so we can continue to monitor her blood sugar overnight.  Final Clinical Impressions(s) / ED Diagnoses   Final diagnoses:  Hypoglycemia      Dorie Rank, MD 10/20/18 2248

## 2018-10-20 NOTE — H&P (Signed)
History and Physical    Destiny Nash CBS:[id number] DOB: 11-10-1945 DOA: 10/20/2018  PCP: [name], MD   Patient coming from: Home.  I have personally briefly reviewed patient's old medical records in [place]  Chief Complaint: Weakness and slurred speech.  HPI: Destiny Nash is a 73 y.o. female with medical history significant of anemia, chronic atrial fibrillation, frequent bronchitis episodes, left breast cancer, cellulitis, type 2 diabetes, hyperlipidemia, hypertension who is coming to the emergency department due to having weakness and slurred speech around 1600 today.  The patient speech was slightly slurred while being in triage, but there were no other neurological symptoms.  She states that she has not been eating much over the past 2 weeks.  Her overall appetite has been decreased ever since she started chemotherapy, but has been significantly worse over the past 2 weeks.  She states that she had 3 or 4 episodes of diaphoresis while sleeping.  Her insulin and oral hypoglycemics had not been adjusted for this decreased oral intake.  She gets frequently fatigue and feels overall weak.  She has chronic lower extremity edema.  She is currently being treated for cellulitis of right lower extremity.  She denies fever, chills, sore throat, wheezing, hemoptysis, chest pain,  palpitations, dizziness, abdominal pain or emesis.  However, the patient gets frequent nausea and loose stools.  She denies melena or hematochezia.  No dysuria, frequency or hematuria.  Denies polyuria, polydipsia, polyphagia or blurred vision.  ED Course: Initial vital signs temperature 98.1 F, pulse 125, respirations 23, blood pressure 163/81 mmHg and O2 sat 99% on room air.  The patient was given dextrose IV push and started on a dextrose 10% infusion.  Initial CBG was 40 mg/dL.  CBC shows a white count of 13.2, hemoglobin 10.0 g/dL and platelets 235.  APTT 35, PT was 20.8 and INR 1.8.  CMP shows  chloride 112 and CO2 19 mmol/L.  All other electrolytes are within normal limits when calcium is corrected to albumin.  Renal function was normal.  She had an EtOH level is less than 10 and glucose of 161 mg/dL.  Total protein was 5.9 and albumin 2.8 g/dL.  Rest of the LFTs were within normal limits.  Her head CT did not have any acute on normalities.  Review of Systems: As per HPI otherwise 10 point review of systems negative.   Past Medical History:  Diagnosis Date   Anemia    in the past    Atrial fibrillation ([place])    a. diagnosed in 02/2018.   Bronchitis    easily  develops bronchitis when she has a cold   Cancer ([place])    L breast - [place], Feb. 4th    Cellulitis    Diabetes mellitus without complication ([place])    Dysrhythmia    afib, treating with Eliquis   Family history of breast cancer    Family history of uterine cancer    Heart murmur    Hyperlipidemia    Hypertension     Past Surgical History:  Procedure Laterality Date   DILATION AND CURETTAGE OF UTERUS     x2 on same day   PORTACATH PLACEMENT N/A 08/01/2018   Procedure: INSERTION PORT-A-CATH WITH ULTRASOUND;  Surgeon: Erroll Luna, MD;  Location: [facility];  Service: General;  Laterality: N/A;   TONSILLECTOMY AND ADENOIDECTOMY       reports that she has never smoked. She has never used smokeless tobacco. She reports previous alcohol use.  She reports that she does not use drugs.  Allergies  Allergen Reactions   Levaquin [Levofloxacin] Other (See Comments)    Joint "seize" and constipation   Codeine Other (See Comments)    Severe headache    Family History  Problem Relation Age of Onset   CAD Brother    Hypertension Mother    Hyperlipidemia Mother    Hypertension Brother    Uterine cancer Maternal Grandmother    Breast cancer Cousin        dx in her 69s; mat first cousin   Breast cancer Cousin        dx in her 82s-60s; mat first cousin   Prior to Admission medications   Medication  Sig Start Date End Date Taking? Authorizing Provider  acetaminophen (TYLENOL) 500 MG tablet Take 1,000 mg by mouth every 6 (six) hours as needed for moderate pain or headache.    [provider]  albuterol (PROVENTIL HFA;VENTOLIN HFA) 108 (90 Base) MCG/ACT inhaler Inhale 2 puffs into the lungs every 6 (six) hours as needed for wheezing or shortness of breath.    [provider]  apixaban (ELIQUIS) 5 MG TABS tablet Take 1 tablet (5 mg total) by mouth 2 (two) times daily. 02/15/18   Orson Eva, MD  cholestyramine Lucrezia Starch) 4 g packet Take 1 packet (4 g total) by mouth 3 (three) times daily with meals. 09/06/18   Nicholas Lose, MD  CINNAMON PO Take 1 tablet by mouth daily.    [provider]  diltiazem (CARDIZEM CD) 300 MG 24 hr capsule Take 1 capsule (300 mg total) by mouth daily. Patient taking differently: Take 300 mg by mouth daily before breakfast.  07/01/18   Arnoldo Lenis, MD  diphenoxylate-atropine (LOMOTIL) 2.5-0.025 MG tablet Take 1 tablet by mouth 4 (four) times daily as needed for diarrhea or loose stools. 09/06/18   Nicholas Lose, MD  doxycycline (VIBRA-TABS) 100 MG tablet Take 1 tablet (100 mg total) by mouth 2 (two) times daily. 10/11/18   Nicholas Lose, MD  glyBURIDE (DIABETA) 5 MG tablet Take 10 mg by mouth 2 (two) times daily. 01/22/18   [provider]  insulin detemir (LEVEMIR) 100 UNIT/ML injection Inject 0.25 mLs (25 Units total) into the skin at bedtime. Patient taking differently: Inject 14 Units into the skin at bedtime.  02/15/18   Orson Eva, MD  lidocaine-prilocaine (EMLA) cream Apply to affected area once 07/25/18   Nicholas Lose, MD  LORazepam (ATIVAN) 0.5 MG tablet Take 1 tablet (0.5 mg total) by mouth at bedtime as needed for sleep. Patient not taking: Reported on 07/26/2018 07/25/18   Nicholas Lose, MD  metFORMIN (GLUCOPHAGE) 500 MG tablet Take 1,000 mg by mouth 2 (two) times daily with a meal.     [provider]  niacin 500 MG  tablet Take 500 mg by mouth daily.    [provider]  Omega-3 1000 MG CAPS Take 1,000 mg by mouth 2 (two) times daily.    [provider]  ondansetron (ZOFRAN) 8 MG tablet Take 1 tablet (8 mg total) by mouth 2 (two) times daily as needed (Nausea or vomiting). 07/25/18   Nicholas Lose, MD  oxyCODONE (OXY IR/ROXICODONE) 5 MG immediate release tablet Take 1 tablet (5 mg total) by mouth every 6 (six) hours as needed for severe pain. 08/01/18   Cornett, Marcello Moores, MD  pantoprazole (PROTONIX) 40 MG tablet Take 1 tablet (40 mg total) by mouth daily. 09/06/18   Nicholas Lose, MD  potassium  chloride SA (K-DUR) 20 MEQ tablet Take 1 tablet (20 mEq total) by mouth 2 (two) times daily. 10/11/18   Nicholas Lose, MD  pravastatin (PRAVACHOL) 20 MG tablet Take 20 mg by mouth daily before breakfast.  01/03/18   [provider]  prochlorperazine (COMPAZINE) 10 MG tablet Take 1 tablet (10 mg total) by mouth every 6 (six) hours as needed (Nausea or vomiting). 07/25/18   Nicholas Lose, MD    Physical Exam: Vitals:   10/20/18 2052 10/20/18 2151 10/20/18 2200 10/20/18 2230  BP: (!) 151/97 140/70 140/78 (!) 144/88  Pulse:    (!) 108  Resp: (!) 21 (!) 21 20 (!) 22  Temp:      TempSrc:      SpO2:    100%  Weight:      Height:        Constitutional: NAD, calm, comfortable Eyes: PERRL, lids and conjunctivae normal ENMT: Mucous membranes are moist. Posterior pharynx clear of any exudate or lesions. Neck: normal, supple, no masses, no thyromegaly Respiratory: clear to auscultation bilaterally, no wheezing, no crackles. Normal respiratory effort. No accessory muscle use.  Cardiovascular: Regular rate and rhythm, no murmurs / rubs / gallops. No extremity edema. 2+ pedal pulses. No carotid bruits.  Abdomen: no tenderness, no masses palpated. No hepatosplenomegaly. Bowel sounds positive.  Musculoskeletal: no clubbing / cyanosis.  Good ROM, no contractures. Normal muscle tone.  Skin: no rashes, lesions,  ulcers on limited dermatological examination. Neurologic: CN 2-12 grossly intact. Sensation intact, DTR normal. Strength 5/5 in all 4.  Psychiatric: Normal judgment and insight. Alert and oriented x 3. Normal mood.   Labs on Admission: I have personally reviewed following labs and imaging studies  CBC: Recent Labs  Lab 10/20/18 1931  WBC 13.2*  NEUTROABS 10.9*  HGB 10.0*  HCT 32.5*  MCV 86.2  PLT 174   Basic Metabolic Panel: Recent Labs  Lab 10/20/18 1931  NA 140  K 4.2  CL 112*  CO2 19*  GLUCOSE 161*  BUN 16  CREATININE 0.77  CALCIUM 7.7*   GFR: Estimated Creatinine Clearance: 86.5 mL/min (by C-G formula based on SCr of 0.77 mg/dL). Liver Function Tests: Recent Labs  Lab 10/20/18 1931  AST 25  ALT 18  ALKPHOS 109  BILITOT 0.5  PROT 5.9*  ALBUMIN 2.8*   No results for input(s): LIPASE, AMYLASE in the last 168 hours. No results for input(s): AMMONIA in the last 168 hours. Coagulation Profile: Recent Labs  Lab 10/20/18 1931  INR 1.8*   Cardiac Enzymes: No results for input(s): CKTOTAL, CKMB, CKMBINDEX, TROPONINI in the last 168 hours. BNP (last 3 results) No results for input(s): PROBNP in the last 8760 hours. HbA1C: No results for input(s): HGBA1C in the last 72 hours. CBG: Recent Labs  Lab 10/20/18 1919 10/20/18 1938 10/20/18 2003 10/20/18 2052 10/20/18 2205  GLUCAP 40* 78 81 76 64*   Lipid Profile: No results for input(s): CHOL, HDL, LDLCALC, TRIG, CHOLHDL, LDLDIRECT in the last 72 hours. Thyroid Function Tests: No results for input(s): TSH, T4TOTAL, FREET4, T3FREE, THYROIDAB in the last 72 hours. Anemia Panel: No results for input(s): VITAMINB12, FOLATE, FERRITIN, TIBC, IRON, RETICCTPCT in the last 72 hours. Urine analysis:    Component Value Date/Time   COLORURINE AMBER (A) 02/13/2018 0620   APPEARANCEUR HAZY (A) 02/13/2018 0620   LABSPEC 1.018 02/13/2018 0620   PHURINE 5.0 02/13/2018 0620   GLUCOSEU 150 (A) 02/13/2018 0620   HGBUR  SMALL (A) 02/13/2018 0620   BILIRUBINUR  NEGATIVE 02/13/2018 0620   KETONESUR NEGATIVE 02/13/2018 0620   PROTEINUR 100 (A) 02/13/2018 0620   NITRITE NEGATIVE 02/13/2018 0620   LEUKOCYTESUR TRACE (A) 02/13/2018 0620    Radiological Exams on Admission: Ct Head Wo Contrast  Result Date: 10/20/2018 CLINICAL DATA:  73 year old female with slurred speech and weakness onset earlier today. EXAM: CT HEAD WITHOUT CONTRAST TECHNIQUE: Contiguous axial images were obtained from the base of the skull through the vertex without intravenous contrast. COMPARISON:  None. FINDINGS: Brain: No midline shift, mass effect, or evidence of intracranial mass lesion. No acute intracranial hemorrhage identified. No ventriculomegaly. Patchy cerebral white matter hypodensity is mild for age. Deep gray matter nuclei and brainstem are within normal limits. Small hypodense area in the left cerebellum on series 2, image 9 is probably chronic. No superimposed No cortically based acute infarct identified. No cerebral cortical encephalomalacia identified. Vascular: Calcified atherosclerosis at the skull base. No suspicious intracranial vascular hyperdensity. Skull: No acute osseous abnormality identified. Sinuses/Orbits: Visualized paranasal sinuses and mastoids are stable and well pneumatized. Other: Visualized orbits and scalp soft tissues are within normal limits. IMPRESSION: 1. 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 2. No acute cortically based infarct or acute intracranial hemorrhage identified. Electronically Signed   By: Genevie Ann M.D.   On: 10/20/2018 20:11   07/30/2018 echocardiogram IMPRESSIONS   1. The left ventricle has normal systolic function with an ejection fraction of 60-65%. The cavity size was normal. Left ventricular diastolic Doppler parameters are indeterminate.  2. The right ventricle has normal systolic function. The cavity was normal. There is no increase in right  ventricular wall thickness.  3. Left atrial size was severely dilated.  4. The mitral valve is normal in structure.  5. The tricuspid valve is normal in structure.  6. The aortic valve is normal in structure.  7. The pulmonic valve was normal in structure.  8. There is mild dilatation of the ascending aorta measuring 37 mm.  9. The inferior vena cava was dilated in size with <50% respiratory variability.  EKG: Independently reviewed. Vent. rate 112 BPM PR interval * ms QRS duration 100 ms QT/QTc 342/444 ms P-R-T axes * 89 161 Atrial fibrillation Ventricular premature complex Anterior infarct, old Borderline repolarization abnormality  Assessment/Plan Principal Problem:   Hypoglycemia   Type 2 diabetes mellitus (HCC) Carbohydrate modified diet. Hold metformin. Hold glyburide. Hold Levemir. Continue dextrose 10% infusion. Dextrose 50% IVP as needed.  Active Problems:   Essential hypertension Continue Cardizem CD 300 mg p.o. daily. Monitor blood pressure and heart rate.    Hyperlipidemia Continue pravastatin 20 mg p.o. daily. Transaminases within normal limits. Fasting lipid profile as an outpatient.    Atrial fibrillation (HCC) CHA?DS?-VASc of at least 4. Continue Cardizem CD 300 mg p.o. daily. Continue apixaban 5 mg p.o. twice daily.    Malignant neoplasm of upper-outer quadrant of left breast in female,   estrogen receptor positive ([place]) Continue follow-up and treatment per oncology.   DVT prophylaxis: On Eliquis. Code Status: Full code. Family Communication: Disposition Plan: Observation for hypoglycemia monitoring and treatment. Consults called: Admission status: Observation/stepdown.   Reubin Milan MD [facility]  10/20/2018, 10:59 PM   This document was prepared using Dragon voice recognition software and may contain some unintended transcription errors.

## 2018-10-20 NOTE — ED Notes (Signed)
CALLED AC FOR MED

## 2018-10-21 ENCOUNTER — Encounter (HOSPITAL_COMMUNITY): Payer: Self-pay

## 2018-10-21 DIAGNOSIS — Z794 Long term (current) use of insulin: Secondary | ICD-10-CM | POA: Diagnosis not present

## 2018-10-21 DIAGNOSIS — E782 Mixed hyperlipidemia: Secondary | ICD-10-CM

## 2018-10-21 DIAGNOSIS — I10 Essential (primary) hypertension: Secondary | ICD-10-CM | POA: Diagnosis not present

## 2018-10-21 DIAGNOSIS — E11649 Type 2 diabetes mellitus with hypoglycemia without coma: Secondary | ICD-10-CM | POA: Diagnosis not present

## 2018-10-21 DIAGNOSIS — Z170 Estrogen receptor positive status [ER+]: Secondary | ICD-10-CM

## 2018-10-21 DIAGNOSIS — I482 Chronic atrial fibrillation, unspecified: Secondary | ICD-10-CM | POA: Diagnosis not present

## 2018-10-21 DIAGNOSIS — C50412 Malignant neoplasm of upper-outer quadrant of left female breast: Secondary | ICD-10-CM | POA: Diagnosis not present

## 2018-10-21 DIAGNOSIS — L03115 Cellulitis of right lower limb: Secondary | ICD-10-CM

## 2018-10-21 DIAGNOSIS — E162 Hypoglycemia, unspecified: Secondary | ICD-10-CM | POA: Diagnosis not present

## 2018-10-21 LAB — URINALYSIS, ROUTINE W REFLEX MICROSCOPIC
Bilirubin Urine: NEGATIVE
Glucose, UA: 50 mg/dL — AB
Ketones, ur: NEGATIVE mg/dL
Nitrite: NEGATIVE
Protein, ur: 30 mg/dL — AB
Specific Gravity, Urine: 1.016 (ref 1.005–1.030)
pH: 5 (ref 5.0–8.0)

## 2018-10-21 LAB — CBC
HCT: 30.4 % — ABNORMAL LOW (ref 36.0–46.0)
Hemoglobin: 9.5 g/dL — ABNORMAL LOW (ref 12.0–15.0)
MCH: 26.7 pg (ref 26.0–34.0)
MCHC: 31.3 g/dL (ref 30.0–36.0)
MCV: 85.4 fL (ref 80.0–100.0)
Platelets: 232 10*3/uL (ref 150–400)
RBC: 3.56 MIL/uL — ABNORMAL LOW (ref 3.87–5.11)
RDW: 17.2 % — ABNORMAL HIGH (ref 11.5–15.5)
WBC: 13.6 10*3/uL — ABNORMAL HIGH (ref 4.0–10.5)
nRBC: 0 % (ref 0.0–0.2)

## 2018-10-21 LAB — BASIC METABOLIC PANEL
Anion gap: 7 (ref 5–15)
BUN: 15 mg/dL (ref 8–23)
CO2: 23 mmol/L (ref 22–32)
Calcium: 7.7 mg/dL — ABNORMAL LOW (ref 8.9–10.3)
Chloride: 109 mmol/L (ref 98–111)
Creatinine, Ser: 0.73 mg/dL (ref 0.44–1.00)
GFR calc Af Amer: >60 mL/min (ref >60)
GFR calc non Af Amer: >60 mL/min (ref >60)
Glucose, Bld: 206 mg/dL — ABNORMAL HIGH (ref 70–99)
Potassium: 4.1 mmol/L (ref 3.5–5.1)
Sodium: 139 mmol/L (ref 135–145)

## 2018-10-21 LAB — GLUCOSE, CAPILLARY
Glucose-Capillary: 144 mg/dL — ABNORMAL HIGH (ref 70–99)
Glucose-Capillary: 159 mg/dL — ABNORMAL HIGH (ref 70–99)
Glucose-Capillary: 194 mg/dL — ABNORMAL HIGH (ref 70–99)
Glucose-Capillary: 168 mg/dL — ABNORMAL HIGH (ref 70–99)

## 2018-10-21 LAB — MRSA PCR SCREENING: MRSA by PCR: POSITIVE — AB

## 2018-10-21 LAB — CBG MONITORING, ED
Glucose-Capillary: 125 mg/dL — ABNORMAL HIGH (ref 70–99)
Glucose-Capillary: 69 mg/dL — ABNORMAL LOW (ref 70–99)

## 2018-10-21 MED ORDER — HEPARIN SOD (PORK) LOCK FLUSH 100 UNIT/ML IV SOLN
500.0000 [IU] | INTRAVENOUS | Status: DC
  Administered 2018-10-21: 11:00:00 500 [IU]

## 2018-10-21 MED ORDER — ALBUTEROL SULFATE (2.5 MG/3ML) 0.083% IN NEBU: 2.5000 mg | INHALATION_SOLUTION | Freq: Four times a day (QID) | RESPIRATORY_TRACT | Status: DC | PRN

## 2018-10-21 MED ORDER — MUPIROCIN 2 % EX OINT: 1.0000 "application " | TOPICAL_OINTMENT | Freq: Two times a day (BID) | CUTANEOUS | Status: DC

## 2018-10-21 MED ORDER — PRAVASTATIN SODIUM 10 MG PO TABS
20.0000 mg | ORAL_TABLET | Freq: Every day | ORAL | Status: DC
  Administered 2018-10-21: 08:00:00 20 mg via ORAL
  Filled 2018-10-21: qty 2

## 2018-10-21 MED ORDER — HEPARIN SOD (PORK) LOCK FLUSH 100 UNIT/ML IV SOLN: 500.0000 [IU] | INTRAVENOUS | Status: DC | PRN

## 2018-10-21 MED ORDER — NYSTATIN 100000 UNIT/GM EX POWD
Freq: Two times a day (BID) | CUTANEOUS | Status: DC
  Administered 2018-10-21: 10:00:00 via TOPICAL
  Filled 2018-10-21: qty 15

## 2018-10-21 MED ORDER — HEPARIN SOD (PORK) LOCK FLUSH 100 UNIT/ML IV SOLN
500.0000 [IU] | INTRAVENOUS | Status: DC | PRN
  Filled 2018-10-21: qty 5

## 2018-10-21 MED ORDER — DEXTROSE 50 % IV SOLN
INTRAVENOUS | Status: AC
  Administered 2018-10-21: 50 mL via INTRAVENOUS
  Filled 2018-10-21: qty 50

## 2018-10-21 MED ORDER — PROCHLORPERAZINE MALEATE 5 MG PO TABS: 10.0000 mg | ORAL_TABLET | Freq: Four times a day (QID) | ORAL | Status: DC | PRN

## 2018-10-21 MED ORDER — DILTIAZEM HCL ER COATED BEADS 300 MG PO CP24
300.0000 mg | ORAL_CAPSULE | Freq: Every day | ORAL | Status: DC
  Administered 2018-10-21: 08:00:00 300 mg via ORAL
  Filled 2018-10-21: qty 1

## 2018-10-21 MED ORDER — INSULIN DETEMIR 100 UNIT/ML ~~LOC~~ SOLN: 11.0000 [IU] | Freq: Every day | SUBCUTANEOUS | 0 refills | Status: DC

## 2018-10-21 MED ORDER — DIPHENOXYLATE-ATROPINE 2.5-0.025 MG PO TABS
1.0000 | ORAL_TABLET | Freq: Four times a day (QID) | ORAL | Status: DC | PRN
  Administered 2018-10-21: 10:00:00 1 via ORAL
  Filled 2018-10-21: qty 1

## 2018-10-21 MED ORDER — CHLORHEXIDINE GLUCONATE CLOTH 2 % EX PADS: 6.0000 | MEDICATED_PAD | Freq: Every day | CUTANEOUS | Status: DC

## 2018-10-21 MED ORDER — DEXTROSE 50 % IV SOLN
50.0000 mL | Freq: Once | INTRAVENOUS | Status: AC
  Administered 2018-10-21: 50 mL via INTRAVENOUS

## 2018-10-21 MED ORDER — PANTOPRAZOLE SODIUM 40 MG PO TBEC
40.0000 mg | DELAYED_RELEASE_TABLET | Freq: Every day | ORAL | Status: DC
  Administered 2018-10-21: 10:00:00 40 mg via ORAL
  Filled 2018-10-21: qty 1

## 2018-10-21 MED ORDER — POTASSIUM CHLORIDE CRYS ER 20 MEQ PO TBCR
20.0000 meq | EXTENDED_RELEASE_TABLET | Freq: Two times a day (BID) | ORAL | Status: DC
  Administered 2018-10-21: 10:00:00 20 meq via ORAL
  Filled 2018-10-21: qty 1

## 2018-10-21 MED ORDER — DOXYCYCLINE HYCLATE 100 MG PO TABS
100.0000 mg | ORAL_TABLET | Freq: Two times a day (BID) | ORAL | Status: DC
  Administered 2018-10-21: 10:00:00 100 mg via ORAL
  Filled 2018-10-21: qty 1

## 2018-10-21 MED ORDER — CHOLESTYRAMINE 4 G PO PACK
4.0000 g | PACK | Freq: Three times a day (TID) | ORAL | Status: DC
  Filled 2018-10-21 (×8): qty 1

## 2018-10-21 MED ORDER — LORAZEPAM 0.5 MG PO TABS: 0.5000 mg | ORAL_TABLET | Freq: Every evening | ORAL | Status: DC | PRN

## 2018-10-21 MED ORDER — APIXABAN 5 MG PO TABS
5.0000 mg | ORAL_TABLET | Freq: Two times a day (BID) | ORAL | Status: DC
  Administered 2018-10-21: 10:00:00 5 mg via ORAL
  Filled 2018-10-21: qty 1

## 2018-10-21 NOTE — Progress Notes (Signed)
PT positive for MRSA. Called at 0545 on 10/21/18. MRSA positive protocol initiated

## 2018-10-21 NOTE — Discharge Summary (Signed)
Physician Discharge Summary  Destiny Nash:[id number] DOB: January 16, 1946 DOA: 10/20/2018  PCP: [name], MD  Admit date: 10/20/2018 Discharge date: 10/21/2018  Time spent: 35 minutes  Recommendations for Outpatient Follow-up:  1. Reassess CBGs and repeat A1c; further adjustment to hypoglycemic regimen as needed. 2. Follow-up blood pressure and adjust antihypertensive regimen as required. 3. Repeat basic metabolic panel to follow electrolytes and renal function.   Discharge Diagnoses:  Principal Problem:   Hypoglycemia Active Problems:   Cellulitis, leg   Essential hypertension   Hyperlipidemia   Malignant neoplasm of upper-outer quadrant of left breast in female, estrogen receptor positive (HCC)   Atrial fibrillation ([place])   Type 2 diabetes mellitus ([name])   Discharge Condition: Stable and improved.  Patient discharged home with instructions to follow-up with PCP in 10 days.  Diet recommendation: Heart healthy modified carbohydrate diet.  Filed Weights   10/20/18 1923 10/21/18 0300 10/21/18 0500  Weight: 120.2 kg 130.6 kg 128.6 kg    History of present illness:  As per H&P written by Dr. Olevia Nash on 10/20/2018 73 y.o. female with medical history significant of anemia, chronic atrial fibrillation, frequent bronchitis episodes, left breast cancer, cellulitis, type 2 diabetes, hyperlipidemia, hypertension who is coming to the emergency department due to having weakness and slurred speech around 1600 today.  The patient speech was slightly slurred while being in triage, but there were no other neurological symptoms.  She states that she has not been eating much over the past 2 weeks.  Her overall appetite has been decreased ever since she started chemotherapy, but has been significantly worse over the past 2 weeks.  She states that she had 3 or 4 episodes of diaphoresis while sleeping.  Her insulin and oral hypoglycemics had not been adjusted for this decreased oral intake.  She  gets frequently fatigue and feels overall weak.  She has chronic lower extremity edema.  She is currently being treated for cellulitis of right lower extremity.  She denies fever, chills, sore throat, wheezing, hemoptysis, chest pain,  palpitations, dizziness, abdominal pain or emesis.  However, the patient gets frequent nausea and loose stools.  She denies melena or hematochezia.  No dysuria, frequency or hematuria. Denies polyuria, polydipsia, polyphagia or blurred vision.  Hospital Course:  1-type 2 diabetes mellitus with hypoglycemia -Patient has been instructed not to skip any meals -Glimepiride has been discontinued at discharge and her Levemir dose has been adjusted. -Patient instructed to follow modified carbohydrate diet and to use as needed feeding supplements -Continue adequate hydration -Follow-up with PCP in 10 days. -Patient improved over 8 hours without supplementation not to experience any low sugars and was not having any symptoms or complaints at time of discharge.  2-generalized weakness and slurred speech -Associated with hypoglycemia -CT head negative for acute abnormalities -Patient symptoms were completely global and without focal neurologic deficits. -Symptoms completely resolve after blood sugar that was low on presentation was repleted. -At discharge she had no symptoms or complaints.  3-essential hypertension -Continue Cardizem and -Heart healthy diet has been instructed -Continue outpatient follow-up of patient's vital signs.  4-hyperlipidemia -Continue statins.  5-atrial fibrillation -Continue Cardizem for rate control -Continue apixaban.  6-malignant neoplasm of upper-outer quadrant of left breast, estrogen receptor positive -Continue outpatient follow-up and treatment per oncology service.  7-right lower extremity cellulitis -Healing appropriately -No open wounds -Patient without any fever or elevated WBCs -Continue antibiotic as previously  prescribed.  8-morbid obesity -Body mass index is 45.76 kg/m. -Low calorie diet, portion  control and increase physical activity discussed with patient.  Procedures:  See below for x-ray reports  Consultations:  None  Discharge Exam: Vitals:   10/21/18 0720 10/21/18 0831  BP:  133/60  Pulse: 73   Resp: (!) 21   Temp: 98.1 F (36.7 C)   SpO2: 100%     General: Afebrile, reports feeling significantly better, nor slow speech, no focal weakness, no chest pain, no shortness of breath. Cardiovascular: Rate controlled, no rubs, no gallops, unable to properly assess JVD secondary to body habitus. Respiratory: Clear to auscultation bilaterally, normal respiratory effort. Abdomen: Obese, nontender, nondistended, positive bowel sounds. Extremities: Right lower extremity with very mild ongoing and properly healing cellulitic process without open wounds, bilateral chronic venous stasis dermatitis/most likely lymphedema, 1-2+ edema.  Discharge Instructions   Discharge Instructions    Diet - low sodium heart healthy   Complete by:  As directed    Diet Carb Modified   Complete by:  As directed    Discharge instructions   Complete by:  As directed    Take medications as prescribed Keep yourself well-hydrated Not skipping meals and use feeding supplements as instructed Arrange follow-up with PCP in 10 days.     Allergies as of 10/21/2018      Reactions   Levaquin [levofloxacin] Other (See Comments)   Joint "seize" and constipation   Codeine Other (See Comments)   Severe headache      Medication List    STOP taking these medications   glyBURIDE 5 MG tablet Commonly known as:  DIABETA     TAKE these medications   acetaminophen 500 MG tablet Commonly known as:  TYLENOL Take 1,000 mg by mouth every 6 (six) hours as needed for moderate pain or headache.   albuterol 108 (90 Base) MCG/ACT inhaler Commonly known as:  VENTOLIN HFA Inhale 2 puffs into the lungs every 6 (six)  hours as needed for wheezing or shortness of breath.   apixaban 5 MG Tabs tablet Commonly known as:  ELIQUIS Take 1 tablet (5 mg total) by mouth 2 (two) times daily.   cholestyramine 4 g packet Commonly known as:  Questran Take 1 packet (4 g total) by mouth 3 (three) times daily with meals.   CINNAMON PO Take 1 tablet by mouth daily.   diltiazem 300 MG 24 hr capsule Commonly known as:  Cardizem CD Take 1 capsule (300 mg total) by mouth daily. What changed:  when to take this   diphenoxylate-atropine 2.5-0.025 MG tablet Commonly known as:  LOMOTIL Take 1 tablet by mouth 4 (four) times daily as needed for diarrhea or loose stools.   doxycycline 100 MG tablet Commonly known as:  VIBRA-TABS Take 1 tablet (100 mg total) by mouth 2 (two) times daily.   insulin detemir 100 UNIT/ML injection Commonly known as:  LEVEMIR Inject 0.11 mLs (11 Units total) into the skin at bedtime. What changed:  how much to take   lidocaine-prilocaine cream Commonly known as:  EMLA Apply to affected area once   LORazepam 0.5 MG tablet Commonly known as:  Ativan Take 1 tablet (0.5 mg total) by mouth at bedtime as needed for sleep.   metFORMIN 500 MG tablet Commonly known as:  GLUCOPHAGE Take 1,000 mg by mouth 2 (two) times daily with a meal.   niacin 500 MG tablet Take 500 mg by mouth daily.   Omega-3 1000 MG Caps Take 1,000 mg by mouth 2 (two) times daily.   ondansetron 8 MG tablet Commonly  known as:  Zofran Take 1 tablet (8 mg total) by mouth 2 (two) times daily as needed (Nausea or vomiting).   oxyCODONE 5 MG immediate release tablet Commonly known as:  Oxy IR/ROXICODONE Take 1 tablet (5 mg total) by mouth every 6 (six) hours as needed for severe pain.   pantoprazole 40 MG tablet Commonly known as:  Protonix Take 1 tablet (40 mg total) by mouth daily.   potassium chloride SA 20 MEQ tablet Commonly known as:  K-DUR Take 1 tablet (20 mEq total) by mouth 2 (two) times daily.    pravastatin 20 MG tablet Commonly known as:  PRAVACHOL Take 20 mg by mouth daily before breakfast.   prochlorperazine 10 MG tablet Commonly known as:  COMPAZINE Take 1 tablet (10 mg total) by mouth every 6 (six) hours as needed (Nausea or vomiting).      Allergies  Allergen Reactions  . Levaquin [Levofloxacin] Other (See Comments)    Joint "seize" and constipation  . Codeine Other (See Comments)    Severe headache   Follow-up Information    [name], MD. Schedule an appointment as soon as possible for a visit in 10 day(s).   Specialty:  Family Medicine Contact information: [place] [address] [phone]        Arnoldo Lenis, MD .   Specialty:  Cardiology Contact information: [address] [place]  [phone]           The results of significant diagnostics from this hospitalization (including imaging, microbiology, ancillary and laboratory) are listed below for reference.    Significant Diagnostic Studies: Ct Head Wo Contrast  Result Date: 10/20/2018 CLINICAL DATA:  73 year old female with slurred speech and weakness onset earlier today. EXAM: CT HEAD WITHOUT CONTRAST TECHNIQUE: Contiguous axial images were obtained from the base of the skull through the vertex without intravenous contrast. COMPARISON:  None. FINDINGS: Brain: No midline shift, mass effect, or evidence of intracranial mass lesion. No acute intracranial hemorrhage identified. No ventriculomegaly. Patchy cerebral white matter hypodensity is mild for age. Deep gray matter nuclei and brainstem are within normal limits. Small hypodense area in the left cerebellum on series 2, image 9 is probably chronic. No superimposed No cortically based acute infarct identified. No cerebral cortical encephalomalacia identified. Vascular: Calcified atherosclerosis at the skull base. No suspicious intracranial vascular hyperdensity. Skull: No acute osseous  abnormality identified. Sinuses/Orbits: Visualized paranasal sinuses and mastoids are stable and well pneumatized. Other: Visualized orbits and scalp soft tissues are within normal limits. IMPRESSION: 1. 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 2. No acute cortically based infarct or acute intracranial hemorrhage identified. Electronically Signed   By: Genevie Ann M.D.   On: 10/20/2018 20:11   Mr Breast Bilateral W Wo Contrast Inc Cad  Result Date: 10/18/2018 CLINICAL DATA:  Patient with history of left breast carcinoma status post neoadjuvant chemotherapy. EXAM: BILATERAL BREAST MRI WITH AND WITHOUT CONTRAST TECHNIQUE: Multiplanar, multisequence MR images of both breasts were obtained prior to and following the intravenous administration of 10 ml of Gadavist Three-dimensional MR images were rendered by post-processing of the original MR data on an independent workstation. The three-dimensional MR images were interpreted, and findings are reported in the following complete MRI report for this study. Three dimensional images were evaluated at the independent DynaCad workstation COMPARISON:  Previous exam(s). FINDINGS: Breast composition: b. Scattered fibroglandular tissue. Background parenchymal enhancement: Minimal Right breast: No mass or  abnormal enhancement. Left breast: Irregular mass is demonstrated within the 1 o'clock position left breast anterior depth, decreased in size when compared to prior exam measuring 2.1 x 1.9 x 1.6 cm (image 123; series 2001), previously 2.6 x 1.9 x 2.4 cm. There is no residual enhancement within the mass. Lymph nodes: No abnormal appearing lymph nodes. Ancillary findings: Port-A-Cath is present within the medial right breast. IMPRESSION: Interval decrease in size and enhancement of left breast mass. RECOMMENDATION: Treatment plan for known left breast malignancy. BI-RADS CATEGORY  6: Known biopsy-proven malignancy. Electronically Signed    By: Lovey Newcomer M.D.   On: 10/18/2018 12:31    Microbiology: Recent Results (from the past 240 hour(s))  SARS Coronavirus 2 (CEPHEID - Performed in [facility] hospital lab), Hosp Order     Status: None   Collection Time: 10/20/18 10:58 PM  Result Value Ref Range Status   SARS Coronavirus 2 NEGATIVE NEGATIVE Final    Comment: (NOTE) If result is NEGATIVE SARS-CoV-2 target nucleic acids are NOT DETECTED. The SARS-CoV-2 RNA is generally detectable in upper and lower  respiratory specimens during the acute phase of infection. The lowest  concentration of SARS-CoV-2 viral copies this assay can detect is 250  copies / mL. A negative result does not preclude SARS-CoV-2 infection  and should not be used as the sole basis for treatment or other  patient management decisions.  A negative result may occur with  improper specimen collection / handling, submission of specimen other  than nasopharyngeal swab, presence of viral mutation(s) within the  areas targeted by this assay, and inadequate number of viral copies  (<250 copies / mL). A negative result must be combined with clinical  observations, patient history, and epidemiological information. If result is POSITIVE SARS-CoV-2 target nucleic acids are DETECTED. The SARS-CoV-2 RNA is generally detectable in upper and lower  respiratory specimens dur ing the acute phase of infection.  Positive  results are indicative of active infection with SARS-CoV-2.  Clinical  correlation with patient history and other diagnostic information is  necessary to determine patient infection status.  Positive results do  not rule out bacterial infection or co-infection with other viruses. If result is PRESUMPTIVE POSTIVE SARS-CoV-2 nucleic acids MAY BE PRESENT.   A presumptive positive result was obtained on the submitted specimen  and confirmed on repeat testing.  While 2019 novel coronavirus  (SARS-CoV-2) nucleic acids may be present in the submitted sample   additional confirmatory testing may be necessary for epidemiological  and / or clinical management purposes  to differentiate between  SARS-CoV-2 and other Sarbecovirus currently known to infect humans.  If clinically indicated additional testing with an alternate test  methodology [phone]) is advised. The SARS-CoV-2 RNA is generally  detectable in upper and lower respiratory sp ecimens during the acute  phase of infection. The expected result is Negative. Fact Sheet for Patients:  StrictlyIdeas.no Fact Sheet for Healthcare Providers: BankingDealers.co.za This test is not yet approved or cleared by the [place] FDA and has been authorized for detection and/or diagnosis of SARS-CoV-2 by FDA under an Emergency Use Authorization (EUA).  This EUA will remain in effect (meaning this test can be used) for the duration of the COVID-19 declaration under Section 564(b)(1) of the Act, 21 U.S.C. section 360bbb-3(b)(1), unless the authorization is terminated or revoked sooner. Performed at [facility], [address], [place], [place] [address]   MRSA PCR Screening     Status: Abnormal   Collection Time: 10/21/18  1:33 AM  Result Value Ref Range Status   MRSA by PCR POSITIVE (A) NEGATIVE Final    Comment:        The GeneXpert MRSA Assay (FDA approved for NASAL specimens only), is one component of a comprehensive MRSA colonization surveillance program. It is not intended to diagnose MRSA infection nor to guide or monitor treatment for MRSA infections. RESULT CALLED TO, READ BACK BY AND VERIFIED WITH: HARRIS,B AT 0545 BY HUFFINES,S ON 10/21/18. Performed at [facility], [address] [place], [place] [address]      Labs: Basic Metabolic Panel: Recent Labs  Lab 10/20/18 1931 10/21/18 0351  NA 140 139  K 4.2 4.1  CL 112* 109  CO2 19* 23  GLUCOSE 161* 206*  BUN 16 15  CREATININE 0.77 0.73  CALCIUM 7.7* 7.7*   Liver  Function Tests: Recent Labs  Lab 10/20/18 1931  AST 25  ALT 18  ALKPHOS 109  BILITOT 0.5  PROT 5.9*  ALBUMIN 2.8*   CBC: Recent Labs  Lab 10/20/18 1931 10/21/18 0351  WBC 13.2* 13.6*  NEUTROABS 10.9*  --   HGB 10.0* 9.5*  HCT 32.5* 30.4*  MCV 86.2 85.4  PLT 235 232    BNP (last 3 results) Recent Labs    02/14/18 0821  BNP 406.0*    CBG: Recent Labs  Lab 10/21/18 0003 10/21/18 0037 10/21/18 0254 10/21/18 0357 10/21/18 0625  GLUCAP 69* 125* 144* 159* 194*    Signed:  Barton Dubois MD.  [facility] 10/21/2018, 9:35 AM

## 2018-10-22 DIAGNOSIS — E1122 Type 2 diabetes mellitus with diabetic chronic kidney disease: Secondary | ICD-10-CM | POA: Diagnosis not present

## 2018-10-22 DIAGNOSIS — E162 Hypoglycemia, unspecified: Secondary | ICD-10-CM | POA: Diagnosis not present

## 2018-10-22 DIAGNOSIS — Z794 Long term (current) use of insulin: Secondary | ICD-10-CM | POA: Diagnosis not present

## 2018-10-22 DIAGNOSIS — R0602 Shortness of breath: Secondary | ICD-10-CM | POA: Diagnosis not present

## 2018-10-24 ENCOUNTER — Other Ambulatory Visit (HOSPITAL_COMMUNITY): Payer: Medicare Other

## 2018-10-25 ENCOUNTER — Ambulatory Visit: Payer: Medicare Other

## 2018-10-25 ENCOUNTER — Other Ambulatory Visit: Payer: Medicare Other

## 2018-10-25 ENCOUNTER — Ambulatory Visit: Payer: Medicare Other | Admitting: Hematology and Oncology

## 2018-10-29 NOTE — Assessment & Plan Note (Signed)
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 Neoadjuvant chemotherapy withTaxol weekly x 8 Herceptin Perjeta (Perjeta discontinued due to diarrhea )every 3 weeksfollowed by Herceptinversus Kadcylamaintenance for 1 year 2. Followed by breast conserving surgery if possible with sentinel lymph node study 3. Followed by adjuvant radiation therapy if patient had lumpectomy 4.Followed by adjuvant antiestrogen therapy ------------------------------------------------------------------------------------------------------------------------------------------------------------------- Current treatment:  Herceptin maintenance.  Perjeta discontinued due to diarrhea Echocardiogram 07/30/2018: EF 60 to 65% Breast MRI post neoadjuvant chemo: Interval decrease in size of left breast cancer 2.6 x 1.9 x 2.4 cm down to 2.1 x 1.9 x 1.6 cm.  No residual enhancement within the mass.  No abnormal lymph nodes  Radiology review: I discussed with the patient that we will have to wait for the final pathology report to determine the true extent of chemotherapy benefit.  I reviewed the pictures of before and after images.  We discussed her case in the breast tumor board.  Plan: Breast conserving surgery with sentinel lymph node biopsy.  If there is residual disease she will go on Kadcyla maintenance.  Return to clinic after surgery to discuss pathology report.  We can do this through virtual visit.

## 2018-10-31 NOTE — Progress Notes (Signed)
Patient Care Team: [name], MD as PCP - General (Family Medicine) Harl Bowie Alphonse Guild, MD as PCP - Cardiology (Cardiology)  DIAGNOSIS:    ICD-10-CM   1. Malignant neoplasm of upper-outer quadrant of left breast in female, estrogen receptor positive ([place]) C50.412    Z17.0     SUMMARY OF ONCOLOGIC HISTORY:   Malignant neoplasm of upper-outer quadrant of left breast in female, estrogen receptor positive ([place])   07/09/2018 Initial Diagnosis    Screening mammogram detected spiculated mass in the left breast upper outer quadrant, 2.6 cm by MRI, axilla negative, biopsy revealed grade 2 IDC with DCIS, ER 100%, PR 70%, Ki-67 10%, HER-2 3+ positive, T2N0 stage Ib clinical stage    07/25/2018 Cancer Staging    Staging form: Breast, AJCC 8th Edition - Clinical stage from 07/25/2018: Stage IB (cT2, cN0, cM0, G2, ER+, PR+, HER2+) - Signed by Nicholas Lose, MD on 07/25/2018    08/02/2018 -  Neo-Adjuvant Chemotherapy    Neoadjuvant chemotherapy with Taxol weekly with Herceptin and Perjeta every 3 weeks    10/13/2018 Genetic Testing    Negative genetic testing on the multicancer panel.  The Multi-Gene Panel offered by [facility] includes sequencing and/or deletion duplication testing of the following 85 genes: AIP, ALK, APC, ATM, AXIN2,BAP1,  BARD1, BLM, BMPR1A, BRCA1, BRCA2, BRIP1, CASR, CDC73, CDH1, CDK4, CDKN1B, CDKN1C, CDKN2A (p14ARF), CDKN2A (p16INK4a), CEBPA, CHEK2, CTNNA1, DICER1, DIS3L2, EGFR (c.2369C>T, p.Thr790Met variant only), EPCAM (Deletion/duplication testing only), FH, FLCN, GATA2, GPC3, GREM1 (Promoter region deletion/duplication testing only), HOXB13 (c.251G>A, p.Gly84Glu), HRAS, KIT, MAX, MEN1, MET, MITF (c.952G>A, p.Glu318Lys variant only), MLH1, MSH2, MSH3, MSH6, MUTYH, NBN, NF1, NF2, NTHL1, PALB2, PDGFRA, PHOX2B, PMS2, POLD1, POLE, POT1, PRKAR1A, PTCH1, PTEN, RAD50, RAD51C, RAD51D, RB1, RECQL4, RET, RNF43, RUNX1, SDHAF2, SDHA (sequence changes only), SDHB, SDHC, SDHD, SMAD4, SMARCA4,  SMARCB1, SMARCE1, STK11, SUFU, TERC, TERT, TMEM127, TP53, TSC1, TSC2, VHL, WRN and WT1.  The report date is Oct 13, 2018.     CHIEF COMPLIANT: Herceptin maintenance   INTERVAL HISTORY: Destiny Nash is a 73 y.o. with above-mentioned history of left breast cancer currently on neoadjuvant chemotherapy with Herceptin every 3 weeks. Recent genetic testing was negative. Breast MRI on 10/18/18 showed decrease in the size of the left breast mass from 2.6cm to 2.1cm. On 10/20/18 she presented to the ED for weakness and numbness and was found to have a blood sugar of 40. She was admitted for observation and discharged the next day. She presents to the clinic today for treatment.  She has erythema and the fungal infection around her private areas.  She started nystatin and appears to be helping.  REVIEW OF SYSTEMS:   Constitutional: Complains of fatigue Eyes: Denies blurriness of vision Ears, nose, mouth, throat, and face: Denies mucositis or sore throat Respiratory: Denies cough, dyspnea or wheezes Cardiovascular: Denies palpitation, chest discomfort Gastrointestinal: Denies nausea, heartburn or change in bowel habits Skin: Denies abnormal skin rashes Lymphatics: Denies new lymphadenopathy or easy bruising Neurological: Denies numbness, tingling or new weaknesses Behavioral/Psych: Mood is stable, no new changes  Extremities: 2+ lower extremity edema Breast: denies any pain or lumps or nodules in either breasts All other systems were reviewed with the patient and are negative.  I have reviewed the past medical history, past surgical history, social history and family history with the patient and they are unchanged from previous note.  ALLERGIES:  is allergic to levaquin [levofloxacin] and codeine.  MEDICATIONS:  Current Outpatient Medications  Medication Sig Dispense Refill  .  acetaminophen (TYLENOL) 500 MG tablet Take 1,000 mg by mouth every 6 (six) hours as needed for moderate pain or  headache.    . albuterol (PROVENTIL HFA;VENTOLIN HFA) 108 (90 Base) MCG/ACT inhaler Inhale 2 puffs into the lungs every 6 (six) hours as needed for wheezing or shortness of breath.    Marland Kitchen apixaban (ELIQUIS) 5 MG TABS tablet Take 1 tablet (5 mg total) by mouth 2 (two) times daily. 60 tablet 0  . cholestyramine (QUESTRAN) 4 g packet Take 1 packet (4 g total) by mouth 3 (three) times daily with meals. 60 each 12  . CINNAMON PO Take 1 tablet by mouth daily.    Marland Kitchen diltiazem (CARDIZEM CD) 300 MG 24 hr capsule Take 1 capsule (300 mg total) by mouth daily. (Patient taking differently: Take 300 mg by mouth daily before breakfast. ) 90 capsule 3  . diphenoxylate-atropine (LOMOTIL) 2.5-0.025 MG tablet Take 1 tablet by mouth 4 (four) times daily as needed for diarrhea or loose stools. 120 tablet 3  . doxycycline (VIBRA-TABS) 100 MG tablet Take 1 tablet (100 mg total) by mouth 2 (two) times daily. 60 tablet 1  . insulin detemir (LEVEMIR) 100 UNIT/ML injection Inject 0.11 mLs (11 Units total) into the skin at bedtime.  0  . lidocaine-prilocaine (EMLA) cream Apply to affected area once 30 g 3  . LORazepam (ATIVAN) 0.5 MG tablet Take 1 tablet (0.5 mg total) by mouth at bedtime as needed for sleep. 30 tablet 0  . metFORMIN (GLUCOPHAGE) 500 MG tablet Take 1,000 mg by mouth 2 (two) times daily with a meal.     . niacin 500 MG tablet Take 500 mg by mouth daily.    . Omega-3 1000 MG CAPS Take 1,000 mg by mouth 2 (two) times daily.    . ondansetron (ZOFRAN) 8 MG tablet Take 1 tablet (8 mg total) by mouth 2 (two) times daily as needed (Nausea or vomiting). 30 tablet 1  . oxyCODONE (OXY IR/ROXICODONE) 5 MG immediate release tablet Take 1 tablet (5 mg total) by mouth every 6 (six) hours as needed for severe pain. 12 tablet 0  . pantoprazole (PROTONIX) 40 MG tablet Take 1 tablet (40 mg total) by mouth daily. 30 tablet 3  . potassium chloride SA (K-DUR) 20 MEQ tablet Take 1 tablet (20 mEq total) by mouth 2 (two) times daily. 30  tablet 3  . pravastatin (PRAVACHOL) 20 MG tablet Take 20 mg by mouth daily before breakfast.   0  . prochlorperazine (COMPAZINE) 10 MG tablet Take 1 tablet (10 mg total) by mouth every 6 (six) hours as needed (Nausea or vomiting). 30 tablet 1   No current facility-administered medications for this visit.     PHYSICAL EXAMINATION: ECOG PERFORMANCE STATUS: 1 - Symptomatic but completely ambulatory  Vitals:   11/01/18 1132  BP: (!) 150/79  Pulse: 98  Resp: 18  Temp: 97.8 F (36.6 C)  SpO2: 100%   Filed Weights    GENERAL: alert, no distress and comfortable SKIN: skin color, texture, turgor are normal, no rashes or significant lesions EYES: normal, Conjunctiva are pink and non-injected, sclera clear OROPHARYNX: no exudate, no erythema and lips, buccal mucosa, and tongue normal  NECK: supple, thyroid normal size, non-tender, without nodularity LYMPH: no palpable lymphadenopathy in the cervical, axillary or inguinal LUNGS: clear to auscultation and percussion with normal breathing effort HEART: regular rate & rhythm and no murmurs and no lower extremity edema ABDOMEN: abdomen soft, non-tender and normal bowel sounds MUSCULOSKELETAL: no  cyanosis of digits and no clubbing  NEURO: alert & oriented x 3 with fluent speech, no focal motor/sensory deficits EXTREMITIES: No lower extremity edema  LABORATORY DATA:  I have reviewed the data as listed CMP Latest Ref Rng & Units 11/01/2018 10/21/2018 10/20/2018  Glucose 70 - 99 mg/dL 97 206(H) 161(H)  BUN 8 - 23 mg/dL 16 15 16   Creatinine 0.44 - 1.00 mg/dL 0.85 0.73 0.77  Sodium 135 - 145 mmol/L 137 139 140  Potassium 3.5 - 5.1 mmol/L 4.5 4.1 4.2  Chloride 98 - 111 mmol/L 104 109 112(H)  CO2 22 - 32 mmol/L 25 23 19(L)  Calcium 8.9 - 10.3 mg/dL 7.4(L) 7.7(L) 7.7(L)  Total Protein 6.5 - 8.1 g/dL 6.0(L) - 5.9(L)  Total Bilirubin 0.3 - 1.2 mg/dL 0.4 - 0.5  Alkaline Phos 38 - 126 U/L 132(H) - 109  AST 15 - 41 U/L 20 - 25  ALT 0 - 44 U/L 16 -  18    Lab Results  Component Value Date   WBC 10.1 11/01/2018   HGB 9.3 (L) 11/01/2018   HCT 30.3 (L) 11/01/2018   MCV 86.1 11/01/2018   PLT 218 11/01/2018   NEUTROABS 7.8 (H) 11/01/2018    ASSESSMENT & PLAN:  Malignant neoplasm of upper-outer quadrant of left breast in female, estrogen receptor positive ([place]) 07/09/2018:Screening mammogram detected spiculated mass in the left breast upper outer quadrant, 2.6 cm by MRI, axilla negative, 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 Neoadjuvant chemotherapy withTaxol weekly x 8 Herceptin Perjeta (Perjeta discontinued due to diarrhea )every 3 weeksfollowed by Herceptinversus Kadcylamaintenance for 1 year 2. Followed by breast conserving surgery if possible with sentinel lymph node study 3. Followed by adjuvant radiation therapy if patient had lumpectomy 4.Followed by adjuvant antiestrogen therapy ------------------------------------------------------------------------------------------------------------------------------------------------------------------- Current treatment:  Herceptin maintenance.  Perjeta discontinued due to diarrhea Echocardiogram 07/30/2018: EF 60 to 65% Breast MRI post neoadjuvant chemo: Interval decrease in size of left breast cancer 2.6 x 1.9 x 2.4 cm down to 2.1 x 1.9 x 1.6 cm.  No residual enhancement within the mass.  No abnormal lymph nodes We reviewed the actual images it appears that the rounded tumor is hollow at this time.  Radiology review: I discussed with the patient that we will have to wait for the final pathology report to determine the true extent of chemotherapy benefit.  I reviewed the pictures of before and after images.  We discussed her case in the breast tumor board.  Plan: Breast conserving surgery with sentinel lymph node biopsy.  If there is residual disease she will go on Kadcyla maintenance.  Protein malnutrition: I stressed the importance of adding protein to her diet.  Return to clinic after surgery to discuss pathology report.  We can do this through virtual visit.    No orders of the defined types were placed in this encounter.  The patient has a good understanding of the overall plan. she agrees with it. she will call with any problems that may develop before the next visit here.  Nicholas Lose, MD 11/01/2018  Julious Oka Dorshimer am acting as scribe for Dr. Nicholas Lose.  I have reviewed the above documentation for accuracy and completeness, and I agree with the above.

## 2018-11-01 ENCOUNTER — Inpatient Hospital Stay: Payer: Medicare Other

## 2018-11-01 ENCOUNTER — Encounter: Payer: Self-pay | Admitting: *Deleted

## 2018-11-01 ENCOUNTER — Ambulatory Visit (HOSPITAL_COMMUNITY)
Admission: RE | Admit: 2018-11-01 | Discharge: 2018-11-01 | Disposition: A | Discharge status: Home / Self Care | Payer: Medicare Other | Source: Ambulatory Visit | Attending: Adult Health | Admitting: Adult Health

## 2018-11-01 ENCOUNTER — Encounter (HOSPITAL_COMMUNITY): Payer: Self-pay

## 2018-11-01 ENCOUNTER — Inpatient Hospital Stay (HOSPITAL_BASED_OUTPATIENT_CLINIC_OR_DEPARTMENT_OTHER): Payer: Medicare Other | Admitting: Hematology and Oncology

## 2018-11-01 ENCOUNTER — Other Ambulatory Visit: Payer: Self-pay

## 2018-11-01 DIAGNOSIS — C50412 Malignant neoplasm of upper-outer quadrant of left female breast: Secondary | ICD-10-CM

## 2018-11-01 DIAGNOSIS — Z170 Estrogen receptor positive status [ER+]: Secondary | ICD-10-CM | POA: Diagnosis not present

## 2018-11-01 DIAGNOSIS — R531 Weakness: Secondary | ICD-10-CM | POA: Diagnosis not present

## 2018-11-01 DIAGNOSIS — R197 Diarrhea, unspecified: Secondary | ICD-10-CM | POA: Diagnosis not present

## 2018-11-01 DIAGNOSIS — Z95828 Presence of other vascular implants and grafts: Secondary | ICD-10-CM

## 2018-11-01 DIAGNOSIS — L03115 Cellulitis of right lower limb: Secondary | ICD-10-CM | POA: Diagnosis not present

## 2018-11-01 DIAGNOSIS — E46 Unspecified protein-calorie malnutrition: Secondary | ICD-10-CM

## 2018-11-01 DIAGNOSIS — Z5112 Encounter for antineoplastic immunotherapy: Secondary | ICD-10-CM | POA: Diagnosis not present

## 2018-11-01 DIAGNOSIS — Z5111 Encounter for antineoplastic chemotherapy: Secondary | ICD-10-CM | POA: Diagnosis not present

## 2018-11-01 LAB — CMP (CANCER CENTER ONLY)
ALT: 16 U/L (ref 0–44)
AST: 20 U/L (ref 15–41)
Albumin: 2.6 g/dL — ABNORMAL LOW (ref 3.5–5.0)
Alkaline Phosphatase: 132 U/L — ABNORMAL HIGH (ref 38–126)
Anion gap: 8 (ref 5–15)
BUN: 16 mg/dL (ref 8–23)
CO2: 25 mmol/L (ref 22–32)
Calcium: 7.4 mg/dL — ABNORMAL LOW (ref 8.9–10.3)
Chloride: 104 mmol/L (ref 98–111)
Creatinine: 0.85 mg/dL (ref 0.44–1.00)
GFR, Est AFR Am: >60 mL/min (ref >60)
GFR, Estimated: >60 mL/min (ref >60)
Glucose, Bld: 97 mg/dL (ref 70–99)
Potassium: 4.5 mmol/L (ref 3.5–5.1)
Sodium: 137 mmol/L (ref 135–145)
Total Bilirubin: 0.4 mg/dL (ref 0.3–1.2)
Total Protein: 6 g/dL — ABNORMAL LOW (ref 6.5–8.1)

## 2018-11-01 LAB — CBC WITH DIFFERENTIAL (CANCER CENTER ONLY)
Abs Immature Granulocytes: 0.05 10*3/uL (ref 0.00–0.07)
Basophils Absolute: 0 10*3/uL (ref 0.0–0.1)
Basophils Relative: 0 %
Eosinophils Absolute: 0.4 10*3/uL (ref 0.0–0.5)
Eosinophils Relative: 4 %
HCT: 30.3 % — ABNORMAL LOW (ref 36.0–46.0)
Hemoglobin: 9.3 g/dL — ABNORMAL LOW (ref 12.0–15.0)
Immature Granulocytes: 1 %
Lymphocytes Relative: 11 %
Lymphs Abs: 1.1 10*3/uL (ref 0.7–4.0)
MCH: 26.4 pg (ref 26.0–34.0)
MCHC: 30.7 g/dL (ref 30.0–36.0)
MCV: 86.1 fL (ref 80.0–100.0)
Monocytes Absolute: 0.7 10*3/uL (ref 0.1–1.0)
Monocytes Relative: 7 %
Neutro Abs: 7.8 10*3/uL — ABNORMAL HIGH (ref 1.7–7.7)
Neutrophils Relative %: 77 %
Platelet Count: 218 10*3/uL (ref 150–400)
RBC: 3.52 MIL/uL — ABNORMAL LOW (ref 3.87–5.11)
RDW: 16.6 % — ABNORMAL HIGH (ref 11.5–15.5)
WBC Count: 10.1 10*3/uL (ref 4.0–10.5)
nRBC: 0 % (ref 0.0–0.2)

## 2018-11-01 MED ORDER — SODIUM CHLORIDE 0.9% FLUSH
10.0000 mL | INTRAVENOUS | Status: DC | PRN
  Administered 2018-11-01: 15:00:00 10 mL
  Filled 2018-11-01: qty 10

## 2018-11-01 MED ORDER — DIPHENHYDRAMINE HCL 50 MG/ML IJ SOLN
INTRAMUSCULAR | Status: AC
  Filled 2018-11-01: qty 1

## 2018-11-01 MED ORDER — ACETAMINOPHEN 325 MG PO TABS
ORAL_TABLET | ORAL | Status: AC
  Filled 2018-11-01: qty 2

## 2018-11-01 MED ORDER — HEPARIN SOD (PORK) LOCK FLUSH 100 UNIT/ML IV SOLN
500.0000 [IU] | Freq: Once | INTRAVENOUS | Status: AC | PRN
  Administered 2018-11-01: 15:00:00 500 [IU]
  Filled 2018-11-01: qty 5

## 2018-11-01 MED ORDER — SODIUM CHLORIDE 0.9% FLUSH
10.0000 mL | Freq: Once | INTRAVENOUS | Status: AC
  Administered 2018-11-01: 10 mL
  Filled 2018-11-01: qty 10

## 2018-11-01 MED ORDER — SODIUM CHLORIDE 0.9 % IV SOLN
Freq: Once | INTRAVENOUS | Status: AC
  Administered 2018-11-01: 13:00:00 via INTRAVENOUS
  Filled 2018-11-01: qty 250

## 2018-11-01 MED ORDER — ACETAMINOPHEN 325 MG PO TABS
650.0000 mg | ORAL_TABLET | Freq: Once | ORAL | Status: AC
  Administered 2018-11-01: 13:00:00 650 mg via ORAL

## 2018-11-01 MED ORDER — TRASTUZUMAB CHEMO 150 MG IV SOLR
750.0000 mg | Freq: Once | INTRAVENOUS | Status: AC
  Administered 2018-11-01: 14:00:00 750 mg via INTRAVENOUS
  Filled 2018-11-01: qty 35.72

## 2018-11-01 MED ORDER — DIPHENHYDRAMINE HCL 50 MG/ML IJ SOLN
25.0000 mg | Freq: Once | INTRAMUSCULAR | Status: AC
  Administered 2018-11-01: 13:00:00 25 mg via INTRAVENOUS

## 2018-11-01 NOTE — Patient Instructions (Signed)
[facility] Discharge Instructions for Patients Receiving Chemotherapy  Today you received the following chemotherapy agents Herceptin.  To help prevent nausea and vomiting after your treatment, we encourage you to take your nausea medication.   If you develop nausea and vomiting that is not controlled by your nausea medication, call the clinic.   BELOW ARE SYMPTOMS THAT SHOULD BE REPORTED IMMEDIATELY:  *FEVER GREATER THAN 100.5 F  *CHILLS WITH OR WITHOUT FEVER  NAUSEA AND VOMITING THAT IS NOT CONTROLLED WITH YOUR NAUSEA MEDICATION  *UNUSUAL SHORTNESS OF BREATH  *UNUSUAL BRUISING OR BLEEDING  TENDERNESS IN MOUTH AND THROAT WITH OR WITHOUT PRESENCE OF ULCERS  *URINARY PROBLEMS  *BOWEL PROBLEMS  UNUSUAL RASH Items with * indicate a potential emergency and should be followed up as soon as possible.  Feel free to call the clinic should you have any questions or concerns. The clinic phone number is [phone].  Please show the [facility] at check-in to the Emergency Department and triage nurse.

## 2018-11-04 ENCOUNTER — Other Ambulatory Visit: Payer: Self-pay

## 2018-11-04 ENCOUNTER — Encounter: Payer: Self-pay | Admitting: Hematology and Oncology

## 2018-11-04 MED ORDER — POTASSIUM CHLORIDE CRYS ER 20 MEQ PO TBCR: 20.0000 meq | EXTENDED_RELEASE_TABLET | Freq: Two times a day (BID) | ORAL | 0 refills | Status: DC

## 2018-11-08 ENCOUNTER — Ambulatory Visit: Payer: Medicare Other

## 2018-11-08 ENCOUNTER — Other Ambulatory Visit: Payer: Medicare Other

## 2018-11-08 ENCOUNTER — Ambulatory Visit: Payer: Medicare Other | Admitting: Adult Health

## 2018-11-11 ENCOUNTER — Other Ambulatory Visit: Payer: Self-pay | Admitting: Surgery

## 2018-11-11 ENCOUNTER — Ambulatory Visit: Payer: Self-pay | Admitting: Surgery

## 2018-11-11 DIAGNOSIS — C50912 Malignant neoplasm of unspecified site of left female breast: Secondary | ICD-10-CM

## 2018-11-11 DIAGNOSIS — I872 Venous insufficiency (chronic) (peripheral): Secondary | ICD-10-CM | POA: Diagnosis not present

## 2018-11-11 NOTE — H&P (Signed)
Destiny Nash Documented: 11/11/2018 9:24 AM Location: [facility] Patient #: [id number] DOB: 07/13/1945 Widowed / Language: Cleophus Molt / Race: White Female  History of Present Illness Marcello Moores A. Nillie Bartolotta MD; 11/11/2018 10:04 AM) Patient words: Patient returns for follow-up of her stage II left breast cancer following neoadjuvant chemotherapy. She is triple positive. Magnetic resonance imaging was done which shows roughly 30% reduction in the size of her left breast mass and DCIS. She has no lymphadenopathy. She has had problems with bilateral lower extremity edema. She states her medical oncologist and primary care doctor have tried to "pull the fluid off "". She is confined to a wheelchair since walking is becoming difficult. She's had no fever or chills and is otherwise done fairly well except for some intolerance December chemotherapy that is now been stopped. The patient takes eloquis at baseline. She denies chest pain, shortness of breath or breast pain. Magnetic resonance imaging is reviewed which shows significant reduction down to 2.1 cm from 2.6 cm.  The patient is a 73 year old female.   Allergies (Sabrina Canty, CMA; 11/11/2018 9:24 AM) CODEINE Levaquin *FLUOROQUINOLONES* Allergies Reconciled  Medication History (Sabrina Canty, CMA; 11/11/2018 9:26 AM) Pravastatin Sodium (20MG Tablet, Oral) Active. metFORMIN HCl (500MG Tablet, Oral) Active. Lisinopril (5MG Tablet, Oral) Active. Cardizem (30MG Tablet, Oral) Active. glyBURIDE (5MG Tablet, Oral) Active. LORazepam (1MG Tablet, Oral) Active. Lisinopril-hydroCHLOROthiazide (20-12.5MG Tablet, Oral) Active. Pantoprazole Sodium (40MG Tablet DR, Oral) Active. Diphenoxylate-Atropine (2.5-0.025MG Tablet, Oral) Active. Nystop (100000 UNIT/GM Powder, External) Active. Ondansetron HCl (8MG Tablet, Oral) Active. Medications Reconciled    Vitals (Sabrina Canty CMA; 11/11/2018 9:26 AM) 11/11/2018 9:26 AM Weight:  273.2 lb Height: 64in Weight was reported by patient. Body Surface Area: 2.23 m Body Mass Index: 46.89 kg/m  Temp.: 96.91F(Temporal)  Pulse: 112 (Regular)  BP: 142/68 (Sitting, Left Arm, Standard)      Physical Exam (Norvell Caswell A. Galina Haddox MD; 11/11/2018 10:05 AM)  General Note: In wheelchair  Head and Neck Note: Alopecia  Eye Eyeball - Bilateral-Extraocular movements intact. Sclera/Conjunctiva - Bilateral-No scleral icterus.  Chest and Lung Exam Chest and lung exam reveals -quiet, even and easy respiratory effort with no use of accessory muscles and on auscultation, normal breath sounds, no adventitious sounds and normal vocal resonance. Inspection Chest Wall - Normal. Back - normal. Note: Port placed right  Breast Breast - Left-Symmetric, Non Tender, No Biopsy scars, no Dimpling, No Inflammation, No Lumpectomy scars, No Mastectomy scars, No Peau d' Orange. Breast - Right-Symmetric, Non Tender, No Biopsy scars, no Dimpling, No Inflammation, No Lumpectomy scars, No Mastectomy scars, No Peau d' Orange. Breast Lump-No Palpable Breast Mass.  Cardiovascular Cardiovascular examination reveals -normal heart sounds, regular rate and rhythm with no murmurs and normal pedal pulses bilaterally.  Musculoskeletal Normal Exam - Left-Upper Extremity Strength Normal and Lower Extremity Strength Normal. Normal Exam - Right-Upper Extremity Strength Normal and Lower Extremity Strength Normal.  Lymphatic Head & Neck  General Head & Neck Lymphatics: Bilateral - Description - Normal. Axillary  General Axillary Region: Bilateral - Description - Normal. Tenderness - Non Tender.    Assessment & Plan (Roylee Chaffin A. Lavilla Delamora MD; 11/11/2018 10:07 AM)  BREAST CANCER, LEFT (C50.912) Impression: Patient has responded well to chemotherapy. She would like to proceed with left breast lumpectomy with sentinel lymph node mapping for breast conservation. Risk of lumpectomy  include bleeding, infection, seroma, more surgery, use of seed/wire, wound care, cosmetic deformity and the need for other treatments, death , blood clots, death. Pt agrees to proceed. Risk  of sentinel lymph node mapping include bleeding, infection, lymphedema, shoulder pain. stiffness, dye allergy. cosmetic deformity , blood clots, death, need for more surgery. Pt agres to proceed.   covid testing risk and potential complications discussed  Current Plans You are being scheduled for surgery- Our schedulers will call you.  You should hear from our office's scheduling department within 5 working days about the location, date, and time of surgery. We try to make accommodations for patient's preferences in scheduling surgery, but sometimes the OR schedule or the surgeon's schedule prevents [place] from making those accommodations.  If you have not heard from our office [phone]) in 5 working days, call the office and ask for your surgeon's nurse.  If you have other questions about your diagnosis, plan, or surgery, call the office and ask for your surgeon's nurse.  We discussed the staging and pathophysiology of breast cancer. We discussed all of the different options for treatment for breast cancer including surgery, chemotherapy, radiation therapy, Herceptin, and antiestrogen therapy. We discussed a sentinel lymph node biopsy as she does not appear to having lymph node involvement right now. We discussed the performance of that with injection of radioactive tracer and blue dye. We discussed that she would have an incision underneath her axillary hairline. We discussed that there is a bout a 10-20% chance of having a positive node with a sentinel lymph node biopsy and we will await the permanent pathology to make any other first further decisions in terms of her treatment. One of these options might be to return to the operating room to perform an axillary lymph node dissection. We discussed about a  1-2% risk lifetime of chronic shoulder pain as well as lymphedema associated with a sentinel lymph node biopsy. We discussed the options for treatment of the breast cancer which included lumpectomy versus a mastectomy. We discussed the performance of the lumpectomy with a wire placement. We discussed a 10-20% chance of a positive margin requiring reexcision in the operating room. We also discussed that she may need radiation therapy or antiestrogen therapy or both if she undergoes lumpectomy. We discussed the mastectomy and the postoperative care for that as well. We discussed that there is no difference in her survival whether she undergoes lumpectomy with radiation therapy or antiestrogen therapy versus a mastectomy. There is a slight difference in the local recurrence rate being 3-5% with lumpectomy and about 1% with a mastectomy. We discussed the risks of operation including bleeding, infection, possible reoperation. She understands her further therapy will be based on what her stages at the time of her operation.  Pt Education - [facility] breast cancer surgery: discussed with patient and provided information. Pt Education - [facility] Breast Biopsy HCI: discussed with patient and provided information. Pt Education - ABC (After Breast Cancer) Class Info: discussed with patient and provided information. 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 Recommend continuing anticoagulation up to 1 day before surgery given her high risk for deep vein thrombosis and PE due to her morbid obesity and breast cancer May need UNNA boot to help continue follow up with medical team for diuresis push surgery out until this improves since she is quite miserable from it   DVT PE  RISK DISCUSSED

## 2018-11-11 NOTE — H&P (View-Only) (Signed)
Destiny Nash Documented: 11/11/2018 9:24 AM Location: [facility] Patient #: [id number] DOB: 1945/11/18 Widowed / Language: Cleophus Molt / Race: White Female  History of Present Illness Marcello Moores A. Bryelle Spiewak MD; 11/11/2018 10:04 AM) Patient words: Patient returns for follow-up of her stage II left breast cancer following neoadjuvant chemotherapy. She is triple positive. Magnetic resonance imaging was done which shows roughly 30% reduction in the size of her left breast mass and DCIS. She has no lymphadenopathy. She has had problems with bilateral lower extremity edema. She states her medical oncologist and primary care doctor have tried to "pull the fluid off "". She is confined to a wheelchair since walking is becoming difficult. She's had no fever or chills and is otherwise done fairly well except for some intolerance December chemotherapy that is now been stopped. The patient takes eloquis at baseline. She denies chest pain, shortness of breath or breast pain. Magnetic resonance imaging is reviewed which shows significant reduction down to 2.1 cm from 2.6 cm.  The patient is a 73 year old female.   Allergies (Sabrina Canty, CMA; 11/11/2018 9:24 AM) CODEINE Levaquin *FLUOROQUINOLONES* Allergies Reconciled  Medication History (Sabrina Canty, CMA; 11/11/2018 9:26 AM) Pravastatin Sodium (20MG Tablet, Oral) Active. metFORMIN HCl (500MG Tablet, Oral) Active. Lisinopril (5MG Tablet, Oral) Active. Cardizem (30MG Tablet, Oral) Active. glyBURIDE (5MG Tablet, Oral) Active. LORazepam (1MG Tablet, Oral) Active. Lisinopril-hydroCHLOROthiazide (20-12.5MG Tablet, Oral) Active. Pantoprazole Sodium (40MG Tablet DR, Oral) Active. Diphenoxylate-Atropine (2.5-0.025MG Tablet, Oral) Active. Nystop (100000 UNIT/GM Powder, External) Active. Ondansetron HCl (8MG Tablet, Oral) Active. Medications Reconciled    Vitals (Sabrina Canty CMA; 11/11/2018 9:26 AM) 11/11/2018 9:26 AM Weight:  273.2 lb Height: 64in Weight was reported by patient. Body Surface Area: 2.23 m Body Mass Index: 46.89 kg/m  Temp.: 96.22F(Temporal)  Pulse: 112 (Regular)  BP: 142/68 (Sitting, Left Arm, Standard)      Physical Exam (Baldwin Racicot A. Naavya Postma MD; 11/11/2018 10:05 AM)  General Note: In wheelchair  Head and Neck Note: Alopecia  Eye Eyeball - Bilateral-Extraocular movements intact. Sclera/Conjunctiva - Bilateral-No scleral icterus.  Chest and Lung Exam Chest and lung exam reveals -quiet, even and easy respiratory effort with no use of accessory muscles and on auscultation, normal breath sounds, no adventitious sounds and normal vocal resonance. Inspection Chest Wall - Normal. Back - normal. Note: Port placed right  Breast Breast - Left-Symmetric, Non Tender, No Biopsy scars, no Dimpling, No Inflammation, No Lumpectomy scars, No Mastectomy scars, No Peau d' Orange. Breast - Right-Symmetric, Non Tender, No Biopsy scars, no Dimpling, No Inflammation, No Lumpectomy scars, No Mastectomy scars, No Peau d' Orange. Breast Lump-No Palpable Breast Mass.  Cardiovascular Cardiovascular examination reveals -normal heart sounds, regular rate and rhythm with no murmurs and normal pedal pulses bilaterally.  Musculoskeletal Normal Exam - Left-Upper Extremity Strength Normal and Lower Extremity Strength Normal. Normal Exam - Right-Upper Extremity Strength Normal and Lower Extremity Strength Normal.  Lymphatic Head & Neck  General Head & Neck Lymphatics: Bilateral - Description - Normal. Axillary  General Axillary Region: Bilateral - Description - Normal. Tenderness - Non Tender.    Assessment & Plan (Emelina Hinch A. Jontavia Leatherbury MD; 11/11/2018 10:07 AM)  BREAST CANCER, LEFT (C50.912) Impression: Patient has responded well to chemotherapy. She would like to proceed with left breast lumpectomy with sentinel lymph node mapping for breast conservation. Risk of lumpectomy  include bleeding, infection, seroma, more surgery, use of seed/wire, wound care, cosmetic deformity and the need for other treatments, death , blood clots, death. Pt agrees to proceed. Risk  of sentinel lymph node mapping include bleeding, infection, lymphedema, shoulder pain. stiffness, dye allergy. cosmetic deformity , blood clots, death, need for more surgery. Pt agres to proceed.   covid testing risk and potential complications discussed  Current Plans You are being scheduled for surgery- Our schedulers will call you.  You should hear from our office's scheduling department within 5 working days about the location, date, and time of surgery. We try to make accommodations for patient's preferences in scheduling surgery, but sometimes the OR schedule or the surgeon's schedule prevents [place] from making those accommodations.  If you have not heard from our office [phone]) in 5 working days, call the office and ask for your surgeon's nurse.  If you have other questions about your diagnosis, plan, or surgery, call the office and ask for your surgeon's nurse.  We discussed the staging and pathophysiology of breast cancer. We discussed all of the different options for treatment for breast cancer including surgery, chemotherapy, radiation therapy, Herceptin, and antiestrogen therapy. We discussed a sentinel lymph node biopsy as she does not appear to having lymph node involvement right now. We discussed the performance of that with injection of radioactive tracer and blue dye. We discussed that she would have an incision underneath her axillary hairline. We discussed that there is a bout a 10-20% chance of having a positive node with a sentinel lymph node biopsy and we will await the permanent pathology to make any other first further decisions in terms of her treatment. One of these options might be to return to the operating room to perform an axillary lymph node dissection. We discussed about a  1-2% risk lifetime of chronic shoulder pain as well as lymphedema associated with a sentinel lymph node biopsy. We discussed the options for treatment of the breast cancer which included lumpectomy versus a mastectomy. We discussed the performance of the lumpectomy with a wire placement. We discussed a 10-20% chance of a positive margin requiring reexcision in the operating room. We also discussed that she may need radiation therapy or antiestrogen therapy or both if she undergoes lumpectomy. We discussed the mastectomy and the postoperative care for that as well. We discussed that there is no difference in her survival whether she undergoes lumpectomy with radiation therapy or antiestrogen therapy versus a mastectomy. There is a slight difference in the local recurrence rate being 3-5% with lumpectomy and about 1% with a mastectomy. We discussed the risks of operation including bleeding, infection, possible reoperation. She understands her further therapy will be based on what her stages at the time of her operation.  Pt Education - [facility] breast cancer surgery: discussed with patient and provided information. Pt Education - [facility] Breast Biopsy HCI: discussed with patient and provided information. Pt Education - ABC (After Breast Cancer) Class Info: discussed with patient and provided information. 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 Recommend continuing anticoagulation up to 1 day before surgery given her high risk for deep vein thrombosis and PE due to her morbid obesity and breast cancer May need UNNA boot to help continue follow up with medical team for diuresis push surgery out until this improves since she is quite miserable from it   DVT PE  RISK DISCUSSED

## 2018-11-12 ENCOUNTER — Telehealth: Payer: Self-pay | Admitting: Hematology and Oncology

## 2018-11-12 NOTE — Telephone Encounter (Signed)
Moved 7/10 to 715 per sch msg. Called and left msg for patient.

## 2018-11-13 ENCOUNTER — Other Ambulatory Visit: Payer: Self-pay

## 2018-11-13 DIAGNOSIS — I83893 Varicose veins of bilateral lower extremities with other complications: Secondary | ICD-10-CM

## 2018-11-14 ENCOUNTER — Other Ambulatory Visit: Payer: Self-pay

## 2018-11-14 ENCOUNTER — Ambulatory Visit (HOSPITAL_COMMUNITY)
Admission: RE | Admit: 2018-11-14 | Discharge: 2018-11-14 | Disposition: A | Discharge status: Home / Self Care | Payer: Medicare Other | Source: Ambulatory Visit | Attending: Family | Admitting: Family

## 2018-11-14 ENCOUNTER — Encounter: Payer: Self-pay | Admitting: Vascular Surgery

## 2018-11-14 ENCOUNTER — Ambulatory Visit (INDEPENDENT_AMBULATORY_CARE_PROVIDER_SITE_OTHER): Payer: Medicare Other | Admitting: Vascular Surgery

## 2018-11-14 VITALS — BP 149/79 | HR 96 | Temp 97.6°F | Resp 14 | Ht 66.0 in | Wt 273.0 lb

## 2018-11-14 DIAGNOSIS — I10 Essential (primary) hypertension: Secondary | ICD-10-CM | POA: Insufficient documentation

## 2018-11-14 DIAGNOSIS — I890 Lymphedema, not elsewhere classified: Secondary | ICD-10-CM | POA: Diagnosis not present

## 2018-11-14 DIAGNOSIS — E119 Type 2 diabetes mellitus without complications: Secondary | ICD-10-CM | POA: Diagnosis not present

## 2018-11-14 DIAGNOSIS — C50412 Malignant neoplasm of upper-outer quadrant of left female breast: Secondary | ICD-10-CM | POA: Diagnosis not present

## 2018-11-14 DIAGNOSIS — Z170 Estrogen receptor positive status [ER+]: Secondary | ICD-10-CM | POA: Diagnosis not present

## 2018-11-14 DIAGNOSIS — I83893 Varicose veins of bilateral lower extremities with other complications: Secondary | ICD-10-CM | POA: Insufficient documentation

## 2018-11-14 DIAGNOSIS — E785 Hyperlipidemia, unspecified: Secondary | ICD-10-CM | POA: Insufficient documentation

## 2018-11-14 DIAGNOSIS — I872 Venous insufficiency (chronic) (peripheral): Secondary | ICD-10-CM

## 2018-11-14 NOTE — Progress Notes (Signed)
REASON FOR CONSULT:    Bilateral lower extremity swelling.  The consult is requested by Dr. Christie Beckers.  ASSESSMENT & PLAN:   BILATERAL LOWER EXTREMITY LYMPHEDEMA: This patient has severe bilateral lower extremity swelling.  Based on her exam I think she likely has bilateral lower extremity lymphedema and also some underlying evidence of chronic venous insufficiency (hyperpigmentation bilaterally). 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  Her swelling is significantly limiting her mobility.  At this point she is essentially wheelchair-bound.  Given that her swelling goes all the way up to her waist, I have recommended a CT of the abdomen and pelvis to rule out any obstruction of the venous or lymphatic system.  We have discussed conservative measures to help with her leg swelling.  However, many of these she is simply unable to perform.  We have discussed the importance of intermittent leg elevation and the proper positioning for this.  However she has a difficult time lying flat and when she does lie flat she has a hard time getting back up.  She lives alone and her daughter checks on her before and after work.  I have written her prescription for knee-high compression stockings with a gradient of 15 to 20 mmHg given that I do not think she can get on tighter stockings.  However these may even be difficult for her to get on given her obesity and limited mobility.  Her swelling is the main reason at this point for her limited mobility.  We discussed the importance of exercise however she is unable to walk and certainly unable to get in a pool as I think water aerobics is very helpful for patients with venous and lymphatic disease.  We have also discussed the importance of weight management given that her central obesity increases her lower extremity venous pressure.  In addition we have encouraged her to keep the skin well lubricated.  I do not think she is a candidate for  self manual lymphatic drainage techniques given her limited mobility.  I will see her back after her CT of the abdomen and pelvis.  If she has not made any progress with these conservative measures then I think she would be a good candidate for the Flexitouch Plus pneumatic compression system.   Destiny Mayo, MD, FACS Beeper [phone] Office: [phone]   HPI:   Destiny Nash is a pleasant 73 y.o. female, who presents with worsening bilateral lower extremity swelling.  According to her daughter she has had some swelling for years however 6 weeks ago she began to have worsening bilateral lower extremity swelling and then developed cellulitis in both legs.  She was put on doxycycline for this.  She has not really been able to elevate her legs because she could not lie flat.  In addition if she lies down she has a hard time getting up.  When she does try to elevate her legs she states that this helps some but this certainly does not resolve the swelling completely.  She has some compression stockings but they are old and she has not been able to get these on.  She spends most of her time sitting in her wheelchair.  She is able to get out of the wheelchair to go to the bathroom and that is about all.  I reviewed the records that were sent from [facility].  This patient has a history of stage II left breast cancer following neoadjuvant chemotherapy.  She has had problems with bilateral lower extremity edema.  She has been confined to a wheelchair since walking has become difficult.  She is scheduled for a lumpectomy of her left breast cancer on 12/10/2018.  She is unaware of any previous history of DVT or phlebitis.  She is on Eliquis.  I believe this is for history of atrial fibrillation. Dr. Brantley Stage has recommended continuing her anticoagulation for up to 1 day prior to surgery given her high risk for DVT and PE.  I would agree that she is at increased risk because of her obesity and  breast cancer.  She has never had any surgery on her abdomen or groins and no history of radiation therapy.  She denies any issues with lymphorrhea.  She has had previous cellulitis in both legs.  Past Medical History:  Diagnosis Date   Anemia    in the past    Atrial fibrillation ([place])    a. diagnosed in 02/2018.   Bronchitis    easily  develops bronchitis when she has a cold   Cancer ([place])    L breast - [place], Feb. 4th    Cellulitis    Diabetes mellitus without complication ([place])    Dysrhythmia    afib, treating with Eliquis   Family history of breast cancer    Family history of uterine cancer    Heart murmur    Hyperlipidemia    Hypertension     Family History  Problem Relation Age of Onset   CAD Brother    Hypertension Mother    Hyperlipidemia Mother    Hypertension Brother    Uterine cancer Maternal Grandmother    Breast cancer Cousin        dx in her 42s; mat first cousin   Breast cancer Cousin        dx in her 48s-60s; mat first cousin    SOCIAL HISTORY: She is not a smoker.  She lost her husband in January.  Her daughter helps take care of her.  She stops by before and after work. Social History   Socioeconomic History   Marital status: Widowed    Spouse name: Not on file   Number of children: Not on file   Years of education: Not on file   Highest education level: Not on file  Occupational History   Not on file  Social Needs   Financial resource strain: Not on file   Food insecurity    Worry: Not on file    Inability: Not on file   Transportation needs    Medical: No    Non-medical: No  Tobacco Use   Smoking status: Never Smoker   Smokeless tobacco: Never Used  Substance and Sexual Activity   Alcohol use: Not Currently   Drug use: Never   Sexual activity: Not Currently  Lifestyle   Physical activity    Days per week: Not on file    Minutes per session: Not on file   Stress: Not on file  Relationships    Social connections    Talks on phone: Not on file    Gets together: Not on file    Attends religious service: Not on file    Active member of club or organization: Not on file    Attends meetings of clubs or organizations: Not on file    Relationship status: Not on file   Intimate partner violence    Fear of current or ex partner: No  Emotionally abused: No    Physically abused: No    Forced sexual activity: No  Other Topics Concern   Not on file  Social History Narrative   Her husband passed away on 29-Jun-2018.   Religion: Christian.    Allergies  Allergen Reactions   Levaquin [Levofloxacin] Other (See Comments)    Joint "seize" and constipation   Codeine Other (See Comments)    Severe headache    Current Outpatient Medications  Medication Sig Dispense Refill   acetaminophen (TYLENOL) 500 MG tablet Take 1,000 mg by mouth every 6 (six) hours as needed for moderate pain or headache.     albuterol (PROVENTIL HFA;VENTOLIN HFA) 108 (90 Base) MCG/ACT inhaler Inhale 2 puffs into the lungs every 6 (six) hours as needed for wheezing or shortness of breath.     apixaban (ELIQUIS) 5 MG TABS tablet Take 1 tablet (5 mg total) by mouth 2 (two) times daily. 60 tablet 0   cholestyramine (QUESTRAN) 4 g packet Take 1 packet (4 g total) by mouth 3 (three) times daily with meals. 60 each 12   CINNAMON PO Take 1 tablet by mouth daily.     diltiazem (CARDIZEM CD) 300 MG 24 hr capsule Take 1 capsule (300 mg total) by mouth daily. (Patient taking differently: Take 300 mg by mouth daily before breakfast. ) 90 capsule 3   diphenoxylate-atropine (LOMOTIL) 2.5-0.025 MG tablet Take 1 tablet by mouth 4 (four) times daily as needed for diarrhea or loose stools. 120 tablet 3   doxycycline (VIBRA-TABS) 100 MG tablet Take 1 tablet (100 mg total) by mouth 2 (two) times daily. 60 tablet 1   insulin detemir (LEVEMIR) 100 UNIT/ML injection Inject 0.11 mLs (11 Units total) into the skin at bedtime.  0     lidocaine-prilocaine (EMLA) cream Apply to affected area once 30 g 3   LORazepam (ATIVAN) 0.5 MG tablet Take 1 tablet (0.5 mg total) by mouth at bedtime as needed for sleep. 30 tablet 0   metFORMIN (GLUCOPHAGE) 500 MG tablet Take 1,000 mg by mouth 2 (two) times daily with a meal.      niacin 500 MG tablet Take 500 mg by mouth daily.     NYSTATIN powder APPLY TOPICALLY TWICE A DAY FOR 14 DAYS     Omega-3 1000 MG CAPS Take 1,000 mg by mouth 2 (two) times daily.     ondansetron (ZOFRAN) 8 MG tablet Take 1 tablet (8 mg total) by mouth 2 (two) times daily as needed (Nausea or vomiting). 30 tablet 1   pantoprazole (PROTONIX) 40 MG tablet Take 1 tablet (40 mg total) by mouth daily. 30 tablet 3   potassium chloride SA (K-DUR) 20 MEQ tablet Take 1 tablet (20 mEq total) by mouth 2 (two) times daily. 60 tablet 0   pravastatin (PRAVACHOL) 20 MG tablet Take 20 mg by mouth daily before breakfast.   0   prochlorperazine (COMPAZINE) 10 MG tablet Take 1 tablet (10 mg total) by mouth every 6 (six) hours as needed (Nausea or vomiting). 30 tablet 1   fluconazole (DIFLUCAN) 150 MG tablet TAKE ONE TABLET BY MOUTH AS A ONE TIME DOSE     oxyCODONE (OXY IR/ROXICODONE) 5 MG immediate release tablet Take 1 tablet (5 mg total) by mouth every 6 (six) hours as needed for severe pain. (Patient not taking: Reported on 11/14/2018) 12 tablet 0   No current facility-administered medications for this visit.     REVIEW OF SYSTEMS:  [X]  denotes positive finding, [ ]   denotes negative finding Cardiac  Comments:  Chest pain or chest pressure:    Shortness of breath upon exertion: x   Short of breath when lying flat: x   Irregular heart rhythm:        Vascular    Pain in calf, thigh, or hip brought on by ambulation:    Pain in feet at night that wakes you up from your sleep:     Blood clot in your veins:    Leg swelling:  x       Pulmonary    Oxygen at home:    Productive cough:     Wheezing:          Neurologic    Sudden weakness in arms or legs:     Sudden numbness in arms or legs:     Sudden onset of difficulty speaking or slurred speech:    Temporary loss of vision in one eye:     Problems with dizziness:  x       Gastrointestinal    Blood in stool:     Vomited blood:         Genitourinary    Burning when urinating:     Blood in urine:        Psychiatric    Major depression:         Hematologic    Bleeding problems:    Problems with blood clotting too easily:        Skin    Rashes or ulcers: x       Constitutional    Fever or chills:     PHYSICAL EXAM:   Vitals:   11/14/18 1500  BP: (!) 149/79  Pulse: 96  Resp: 14  Temp: 97.6 F (36.4 C)  TempSrc: Temporal  SpO2: 100%  Weight: 273 lb (123.8 kg)  Height: 5' 6"  (1.676 m)   Body mass index is 44.06 kg/m.  GENERAL: The patient is a well-nourished female, in no acute distress. The vital signs are documented above. CARDIAC: There is a regular rate and rhythm.  VASCULAR:  ARTERIAL: I do not detect carotid bruits. Because of her size I am unable to assess femoral popliteal or pedal pulses. On the right side, she has a biphasic dorsalis pedis signal and a monophasic posterior tibial signal which is fairly brisk. On the left side she has a biphasic dorsalis pedis and posterior tibial signal. VENOUS EXAM:  She has significant bilateral lower extremity pitting edema. Stage II swelling bilat. ((International Society of Lymphology) She has some hyperpigmentation bilaterally and some mild underlying cellulitis. Her right leg measures 24 1/4 inches in diameter. Her left leg measures 24 3/4 inches in diameter.  RIGHT LEG:   LEFT LEG:     PULMONARY: There is good air exchange bilaterally without wheezing or rales. ABDOMEN: Soft and non-tender with normal pitched bowel sounds.  MUSCULOSKELETAL: There are no major deformities or cyanosis. NEUROLOGIC: No focal weakness or paresthesias are detected. SKIN:  There are no ulcers or rashes noted. PSYCHIATRIC: The patient has a normal affect.  DATA:    VENOUS DUPLEX: I have independently interpreted her venous duplex scan today.  Of note this was a limited study because of her body habitus.  On the right side there was no obvious evidence of DVT.  No reflux could be demonstrated.  The right great saphenous vein was dilated with diameters ranging from 0.5-0.68 cm.  On the left side there was no obvious evidence of DVT.  No reflux could be demonstrated.  The left great saphenous vein was moderately dilated with diameters ranging from 0.4-0.46 cm.  LABS: I reviewed her labs from 11/01/2018.  GFR was greater than 60.  White blood cell count 10.1.  Hemoglobin 9.3.  Platelets 218,000.  A total of 60 minutes was spent on this visit. 30 minutes was face to face time. More than 50% of the time was spent on counseling and coordinating with the patient.

## 2018-11-15 ENCOUNTER — Ambulatory Visit: Payer: Medicare Other

## 2018-11-15 ENCOUNTER — Other Ambulatory Visit: Payer: Medicare Other

## 2018-11-15 ENCOUNTER — Other Ambulatory Visit: Payer: Self-pay | Admitting: Vascular Surgery

## 2018-11-15 ENCOUNTER — Ambulatory Visit (HOSPITAL_BASED_OUTPATIENT_CLINIC_OR_DEPARTMENT_OTHER)
Admission: RE | Admit: 2018-11-15 | Discharge: 2018-11-15 | Disposition: A | Discharge status: Home / Self Care | Payer: Medicare Other | Source: Ambulatory Visit | Attending: Adult Health | Admitting: Adult Health

## 2018-11-15 ENCOUNTER — Other Ambulatory Visit: Payer: Self-pay

## 2018-11-15 DIAGNOSIS — I872 Venous insufficiency (chronic) (peripheral): Secondary | ICD-10-CM

## 2018-11-15 DIAGNOSIS — Z170 Estrogen receptor positive status [ER+]: Secondary | ICD-10-CM | POA: Diagnosis not present

## 2018-11-15 DIAGNOSIS — I10 Essential (primary) hypertension: Secondary | ICD-10-CM | POA: Diagnosis not present

## 2018-11-15 DIAGNOSIS — Z01818 Encounter for other preprocedural examination: Secondary | ICD-10-CM

## 2018-11-15 DIAGNOSIS — C50412 Malignant neoplasm of upper-outer quadrant of left female breast: Secondary | ICD-10-CM | POA: Diagnosis not present

## 2018-11-15 DIAGNOSIS — L519 Erythema multiforme, unspecified: Secondary | ICD-10-CM

## 2018-11-15 DIAGNOSIS — E119 Type 2 diabetes mellitus without complications: Secondary | ICD-10-CM | POA: Diagnosis not present

## 2018-11-15 DIAGNOSIS — E785 Hyperlipidemia, unspecified: Secondary | ICD-10-CM | POA: Diagnosis not present

## 2018-11-15 DIAGNOSIS — I83893 Varicose veins of bilateral lower extremities with other complications: Secondary | ICD-10-CM | POA: Diagnosis not present

## 2018-11-15 NOTE — Progress Notes (Signed)
  Echocardiogram 2D Echocardiogram has been performed.  Burnett Kanaris 11/15/2018, 11:55 AM

## 2018-11-17 ENCOUNTER — Other Ambulatory Visit: Payer: Self-pay | Admitting: Hematology and Oncology

## 2018-11-17 DIAGNOSIS — C50412 Malignant neoplasm of upper-outer quadrant of left female breast: Secondary | ICD-10-CM

## 2018-11-17 DIAGNOSIS — Z170 Estrogen receptor positive status [ER+]: Secondary | ICD-10-CM

## 2018-11-18 MED ORDER — LORAZEPAM 0.5 MG PO TABS: 0.5000 mg | ORAL_TABLET | Freq: Every evening | ORAL | 0 refills | Status: DC | PRN

## 2018-11-19 ENCOUNTER — Encounter: Payer: Self-pay | Admitting: Hematology and Oncology

## 2018-11-20 DIAGNOSIS — R600 Localized edema: Secondary | ICD-10-CM | POA: Diagnosis not present

## 2018-11-22 ENCOUNTER — Other Ambulatory Visit: Payer: Self-pay

## 2018-11-22 ENCOUNTER — Inpatient Hospital Stay: Payer: Medicare Other | Attending: Hematology and Oncology

## 2018-11-22 VITALS — BP 155/88 | HR 103 | Temp 98.3°F | Resp 18

## 2018-11-22 DIAGNOSIS — B488 Other specified mycoses: Secondary | ICD-10-CM | POA: Diagnosis not present

## 2018-11-22 DIAGNOSIS — C50412 Malignant neoplasm of upper-outer quadrant of left female breast: Secondary | ICD-10-CM | POA: Diagnosis not present

## 2018-11-22 DIAGNOSIS — Z5112 Encounter for antineoplastic immunotherapy: Secondary | ICD-10-CM | POA: Diagnosis not present

## 2018-11-22 DIAGNOSIS — Z170 Estrogen receptor positive status [ER+]: Secondary | ICD-10-CM | POA: Insufficient documentation

## 2018-11-22 DIAGNOSIS — Z79899 Other long term (current) drug therapy: Secondary | ICD-10-CM | POA: Diagnosis not present

## 2018-11-22 DIAGNOSIS — E46 Unspecified protein-calorie malnutrition: Secondary | ICD-10-CM | POA: Diagnosis not present

## 2018-11-22 DIAGNOSIS — L539 Erythematous condition, unspecified: Secondary | ICD-10-CM | POA: Diagnosis not present

## 2018-11-22 MED ORDER — ACETAMINOPHEN 325 MG PO TABS
650.0000 mg | ORAL_TABLET | Freq: Once | ORAL | Status: AC
  Administered 2018-11-22: 650 mg via ORAL

## 2018-11-22 MED ORDER — DIPHENHYDRAMINE HCL 50 MG/ML IJ SOLN
25.0000 mg | Freq: Once | INTRAMUSCULAR | Status: AC
  Administered 2018-11-22: 25 mg via INTRAVENOUS

## 2018-11-22 MED ORDER — HEPARIN SOD (PORK) LOCK FLUSH 100 UNIT/ML IV SOLN
500.0000 [IU] | Freq: Once | INTRAVENOUS | Status: AC | PRN
  Administered 2018-11-22: 500 [IU]
  Filled 2018-11-22: qty 5

## 2018-11-22 MED ORDER — SODIUM CHLORIDE 0.9 % IV SOLN
Freq: Once | INTRAVENOUS | Status: AC
  Administered 2018-11-22: 10:00:00 via INTRAVENOUS
  Filled 2018-11-22: qty 250

## 2018-11-22 MED ORDER — DIPHENHYDRAMINE HCL 50 MG/ML IJ SOLN
INTRAMUSCULAR | Status: AC
  Filled 2018-11-22: qty 1

## 2018-11-22 MED ORDER — TRASTUZUMAB CHEMO 150 MG IV SOLR
750.0000 mg | Freq: Once | INTRAVENOUS | Status: AC
  Administered 2018-11-22: 750 mg via INTRAVENOUS
  Filled 2018-11-22: qty 35.72

## 2018-11-22 MED ORDER — DIPHENHYDRAMINE HCL 25 MG PO CAPS
ORAL_CAPSULE | ORAL | Status: AC
  Filled 2018-11-22: qty 1

## 2018-11-22 MED ORDER — ACETAMINOPHEN 325 MG PO TABS
ORAL_TABLET | ORAL | Status: AC
  Filled 2018-11-22: qty 2

## 2018-11-22 MED ORDER — SODIUM CHLORIDE 0.9% FLUSH
10.0000 mL | INTRAVENOUS | Status: DC | PRN
  Administered 2018-11-22: 10 mL
  Filled 2018-11-22: qty 10

## 2018-11-22 NOTE — Progress Notes (Signed)
Per Dr Lindi Adie OK to treat with HR 103 and pt does not need IV fluid bolus

## 2018-11-22 NOTE — Patient Instructions (Signed)
[facility] Discharge Instructions for Patients Receiving Chemotherapy  Today you received the following chemotherapy agent: Herceptin.  To help prevent nausea and vomiting after your treatment, we encourage you to take your nausea medication.   If you develop nausea and vomiting that is not controlled by your nausea medication, call the clinic.   BELOW ARE SYMPTOMS THAT SHOULD BE REPORTED IMMEDIATELY:  *FEVER GREATER THAN 100.5 F  *CHILLS WITH OR WITHOUT FEVER  NAUSEA AND VOMITING THAT IS NOT CONTROLLED WITH YOUR NAUSEA MEDICATION  *UNUSUAL SHORTNESS OF BREATH  *UNUSUAL BRUISING OR BLEEDING  TENDERNESS IN MOUTH AND THROAT WITH OR WITHOUT PRESENCE OF ULCERS  *URINARY PROBLEMS  *BOWEL PROBLEMS  UNUSUAL RASH Items with * indicate a potential emergency and should be followed up as soon as possible.  Feel free to call the clinic should you have any questions or concerns. The clinic phone number is [phone].  Please show the [facility] at check-in to the Emergency Department and triage nurse.

## 2018-11-26 ENCOUNTER — Other Ambulatory Visit: Payer: Self-pay

## 2018-11-26 ENCOUNTER — Telehealth (INDEPENDENT_AMBULATORY_CARE_PROVIDER_SITE_OTHER): Payer: Medicare Other | Admitting: Cardiology

## 2018-11-26 ENCOUNTER — Encounter: Payer: Self-pay | Admitting: Cardiology

## 2018-11-26 VITALS — Ht 66.0 in | Wt 273.0 lb

## 2018-11-26 DIAGNOSIS — I10 Essential (primary) hypertension: Secondary | ICD-10-CM

## 2018-11-26 DIAGNOSIS — I272 Pulmonary hypertension, unspecified: Secondary | ICD-10-CM

## 2018-11-26 DIAGNOSIS — I4819 Other persistent atrial fibrillation: Secondary | ICD-10-CM | POA: Diagnosis not present

## 2018-11-26 NOTE — Patient Instructions (Signed)
Medication Instructions:  Your physician recommends that you continue on your current medications as directed. Please refer to the Current Medication list given to you today.  Labwork: none  Testing/Procedures: none  Follow-Up: Your physician wants you to follow-up in: 4 months.  You will receive a reminder letter in the mail two months in advance. If you don't receive a letter, please call our office to schedule the follow-up appointment.   Any Other Special Instructions Will Be Listed Below (If Applicable).     If you need a refill on your cardiac medications before your next appointment, please call your pharmacy.

## 2018-11-26 NOTE — Progress Notes (Signed)
Virtual Visit via Telephone Note   This visit type was conducted due to national recommendations for restrictions regarding the COVID-19 Pandemic (e.g. social distancing) in an effort to limit this patient's exposure and mitigate transmission in our community.  Due to her co-morbid illnesses, this patient is at least at moderate risk for complications without adequate follow up.  This format is felt to be most appropriate for this patient at this time.  The patient did not have access to video technology/had technical difficulties with video requiring transitioning to audio format only (telephone).  All issues noted in this document were discussed and addressed.  No physical exam could be performed with this format.  Please refer to the patient's chart for her  consent to telehealth for [facility].   Date:  11/26/2018   ID:  Destiny Nash, DOB 1946-05-12, MRN [id number]  Patient Location: Home Provider Location: Office  PCP:  [name], MD  Cardiologist:  Carlyle Dolly, MD  Electrophysiologist:  None   Evaluation Performed:  Follow-Up Visit  Chief Complaint:  Follow up  History of Present Illness:    Destiny Nash is a 73 y.o. female seen today for follow up of the following medical problems.   1. Afib - new diagnosis during 02/2018 admission - resistant to cardiversion atlastoutpatient f/u   - no recent palpitations - no issues with anticoag.     2. Pulmonary HTN - 02/2018 echo PASP 65. Biatrial enlargement would suggests some diasotlic dysfunction though it was indeterminant by the study, LVEF was 55-60%. - 07/2018 echo PASP not reported, a reported RVSP is 28 (TR peak frad 13, RA pressure 15), unclear accuracy as this is reported only in the body of the report  - ANA neg, HIV neg, LFTs normal - needs sleep study, PFTs - she was resistant to sleep study at last visit  - wants to hold off on sleep study  3. Breast cancer - needs echo every 3  months as requested by her oncologist. Last echo 07/2018 LVEF 60-65%, normal RV function,  - she is on herceptic, taxol, pertuzumab   4. HTN - she is compliant with meds   5. LE edema - seen by vascular, thought to be lymphedema and venous insufficiency   SH: husband recently passed away    The patient does not have symptoms concerning for COVID-19 infection (fever, chills, cough, or new shortness of breath).    Past Medical History:  Diagnosis Date  . Anemia    in the past   . Atrial fibrillation ([place])    a. diagnosed in 02/2018.  . Bronchitis    easily  develops bronchitis when she has a cold  . Cancer ([place])    L breast - CA, Feb. 4th   . Cellulitis   . Diabetes mellitus without complication ([place])   . Dysrhythmia    afib, treating with Eliquis  . Family history of breast cancer   . Family history of uterine cancer   . Heart murmur   . Hyperlipidemia   . Hypertension    Past Surgical History:  Procedure Laterality Date  . DILATION AND CURETTAGE OF UTERUS     x2 on same day  . PORTACATH PLACEMENT N/A 08/01/2018   Procedure: INSERTION PORT-A-CATH WITH ULTRASOUND;  Surgeon: Erroll Luna, MD;  Location: [place];  Service: General;  Laterality: N/A;  . TONSILLECTOMY AND ADENOIDECTOMY       Current Meds  Medication Sig  . acetaminophen (TYLENOL) 500  MG tablet Take 1,000 mg by mouth every 6 (six) hours as needed for moderate pain or headache.  . albuterol (PROVENTIL HFA;VENTOLIN HFA) 108 (90 Base) MCG/ACT inhaler Inhale 2 puffs into the lungs every 6 (six) hours as needed for wheezing or shortness of breath.  Marland Kitchen apixaban (ELIQUIS) 5 MG TABS tablet Take 1 tablet (5 mg total) by mouth 2 (two) times daily.  . cholestyramine (QUESTRAN) 4 g packet Take 1 packet (4 g total) by mouth 3 (three) times daily with meals.  Marland Kitchen CINNAMON PO Take 1 tablet by mouth daily.  Marland Kitchen diltiazem (CARDIZEM CD) 300 MG 24 hr capsule Take 1 capsule (300 mg total) by mouth daily. (Patient taking  differently: Take 300 mg by mouth daily before breakfast. )  . diphenoxylate-atropine (LOMOTIL) 2.5-0.025 MG tablet Take 1 tablet by mouth 4 (four) times daily as needed for diarrhea or loose stools.  Marland Kitchen doxycycline (VIBRA-TABS) 100 MG tablet Take 1 tablet (100 mg total) by mouth 2 (two) times daily.  . fluconazole (DIFLUCAN) 150 MG tablet TAKE ONE TABLET BY MOUTH AS A ONE TIME DOSE  . insulin detemir (LEVEMIR) 100 UNIT/ML injection Inject 0.11 mLs (11 Units total) into the skin at bedtime.  Marland Kitchen LORazepam (ATIVAN) 0.5 MG tablet Take 1 tablet (0.5 mg total) by mouth at bedtime as needed for sleep.  . metFORMIN (GLUCOPHAGE) 500 MG tablet Take 1,000 mg by mouth 2 (two) times daily with a meal.   . niacin 500 MG tablet Take 500 mg by mouth daily.  . NYSTATIN powder APPLY TOPICALLY TWICE A DAY FOR 14 DAYS  . Omega-3 1000 MG CAPS Take 1,000 mg by mouth 2 (two) times daily.  . ondansetron (ZOFRAN) 8 MG tablet Take 1 tablet (8 mg total) by mouth 2 (two) times daily as needed (Nausea or vomiting).  . pantoprazole (PROTONIX) 40 MG tablet Take 1 tablet (40 mg total) by mouth daily.  . potassium chloride SA (K-DUR) 20 MEQ tablet Take 1 tablet (20 mEq total) by mouth 2 (two) times daily.  . pravastatin (PRAVACHOL) 20 MG tablet Take 20 mg by mouth daily before breakfast.   . [DISCONTINUED] lidocaine-prilocaine (EMLA) cream Apply to affected area once     Allergies:   Levaquin [levofloxacin] and Codeine   Social History   Tobacco Use  . Smoking status: Never Smoker  . Smokeless tobacco: Never Used  Substance Use Topics  . Alcohol use: Not Currently  . Drug use: Never     Family Hx: The patient's family history includes Breast cancer in her cousin and cousin; CAD in her brother; Hyperlipidemia in her mother; Hypertension in her brother and mother; Uterine cancer in her maternal grandmother.  ROS:   Please see the history of present illness.     All other systems reviewed and are negative.   Prior  CV studies:   The following studies were reviewed today:  11/2018 echo LVEF >65%, indeterminate diastolic function. Normal RV. Mild LAE. PASP 57 mmHg.       Labs/Other Tests and Data Reviewed:    EKG:  No ECG reviewed.  Recent Labs: 02/14/2018: B Natriuretic Peptide 406.0; TSH 1.862 02/15/2018: Magnesium 1.7 11/01/2018: ALT 16; BUN 16; Creatinine 0.85; Hemoglobin 9.3; Platelet Count 218; Potassium 4.5; Sodium 137   Recent Lipid Panel No results found for: CHOL, TRIG, HDL, CHOLHDL, LDLCALC, LDLDIRECT  Wt Readings from Last 3 Encounters:  11/26/18 273 lb (123.8 kg)  11/14/18 273 lb (123.8 kg)  10/21/18 283 lb 8.2 oz (128.6  kg)     Objective:    Vital Signs:  Ht 5' 6"  (1.676 m)   Wt 273 lb (123.8 kg)   BMI 44.06 kg/m    Today's Vitals   11/26/18 1011  Weight: 273 lb (123.8 kg)  Height: 5' 6"  (1.676 m)   Body mass index is 44.06 kg/m.  Normal affect. Normal speech pattern and tone. No audible signs of SOB or wheezing. Comfortable, no apparent distress  ASSESSMENT & PLAN:     1. Afib - continue current meds. Has been resistant to trial of cardioversion  2. Pulmonary HTN - needs further work with sleep study and PFTs. - she has asked to hold on further wokrup at this time  3. HTN - continue current meds     COVID-19 Education: The signs and symptoms of COVID-19 were discussed with the patient and how to seek care for testing (follow up with PCP or arrange E-visit).  The importance of social distancing was discussed today.  Time:   Today, I have spent 15 minutes with the patient with telehealth technology discussing the above problems.     Medication Adjustments/Labs and Tests Ordered: Current medicines are reviewed at length with the patient today.  Concerns regarding medicines are outlined above.   Tests Ordered: No orders of the defined types were placed in this encounter.   Medication Changes: No orders of the defined types were placed in this  encounter.   Follow Up:  In Person 4 months  Signed, Carlyle Dolly, MD  11/26/2018 10:30 AM    [place]

## 2018-11-29 ENCOUNTER — Ambulatory Visit
Admission: RE | Admit: 2018-11-29 | Discharge: 2018-11-29 | Disposition: A | Discharge status: Home / Self Care | Payer: Medicare Other | Source: Ambulatory Visit | Attending: Vascular Surgery | Admitting: Vascular Surgery

## 2018-11-29 ENCOUNTER — Other Ambulatory Visit: Payer: Self-pay

## 2018-11-29 ENCOUNTER — Other Ambulatory Visit: Payer: Self-pay | Admitting: Hematology and Oncology

## 2018-11-29 DIAGNOSIS — L519 Erythema multiforme, unspecified: Secondary | ICD-10-CM

## 2018-11-29 DIAGNOSIS — R601 Generalized edema: Secondary | ICD-10-CM | POA: Diagnosis not present

## 2018-11-29 DIAGNOSIS — I701 Atherosclerosis of renal artery: Secondary | ICD-10-CM | POA: Diagnosis not present

## 2018-11-29 DIAGNOSIS — I872 Venous insufficiency (chronic) (peripheral): Secondary | ICD-10-CM

## 2018-11-29 MED ORDER — IOPAMIDOL (ISOVUE-370) INJECTION 76%
150.0000 mL | Freq: Once | INTRAVENOUS | Status: AC | PRN
  Administered 2018-11-29: 150 mL via INTRAVENOUS

## 2018-12-03 ENCOUNTER — Other Ambulatory Visit: Payer: Self-pay

## 2018-12-03 ENCOUNTER — Encounter (HOSPITAL_BASED_OUTPATIENT_CLINIC_OR_DEPARTMENT_OTHER): Payer: Self-pay | Admitting: *Deleted

## 2018-12-03 ENCOUNTER — Telehealth: Payer: Self-pay | Admitting: Cardiology

## 2018-12-03 NOTE — Progress Notes (Signed)
Pt will need cards clearance for surgery. Will also bring her in for anesthesiology consult before surgery. I have already reviewed pts hx with Dr. Sabra Heck and he would like to make sure that the current SOB that the pt was experiencing over the phone with myself, has resolved and will not require that the pt be moved to main due to pulmonary hypertension. In process of calling Carlyle Dolly, MD her cardiologist to request cards clearance for outpatient surgery at [facility].  Will continue to follow.

## 2018-12-03 NOTE — Progress Notes (Signed)
Spoke with Jarrett Soho at Dr. Nelly Laurence office who is sending message out for cards clearance for this pt.

## 2018-12-03 NOTE — Telephone Encounter (Signed)
Spoke with pt's daughter. She states that the pt is not having any worsening SOB. She did stop taking her fluid pills for a short period as the chemotherapy had worn her out. She has started taking it again and it has helped with her fluid retention. Daughter states that pt was seen by a Dr. Doren Custard for cellulitis. Pt is scheduled for surgery and needs clearance and advice on when to stop eliquis. Please advise.

## 2018-12-03 NOTE — Telephone Encounter (Signed)
Per Janett Billow w/ [facility]-- pt is needing surgical clearance for LEFT BREAST LUMPECTOMY WITH RADIOACTIVE SEED AND SENTINEL LYMPH NODE MAPPING-- surgery is scheduled for 12/10/2018 please put note in Epic if she is cleared for surgery.    Also, Janett Billow spoke with the patient over the phone and pt had noticeable SOB, pt stated it was from fluid retention. Please give pt a call.

## 2018-12-03 NOTE — Telephone Encounter (Signed)
Called pt., no answer. Left message for pt to return call.  

## 2018-12-04 ENCOUNTER — Other Ambulatory Visit: Payer: Self-pay | Admitting: Hematology and Oncology

## 2018-12-04 MED ORDER — DOXYCYCLINE HYCLATE 100 MG PO TABS: 100.0000 mg | ORAL_TABLET | Freq: Two times a day (BID) | ORAL | 0 refills | Status: DC

## 2018-12-04 NOTE — Telephone Encounter (Signed)
Will forward to Erich Montane, RN. - I left message on pt's daughter's phone to inform her of recommendations for Eliquis.

## 2018-12-04 NOTE — Telephone Encounter (Signed)
[facility] for surgery from cardiac standpoint. Hold eliquis starting 2 days prior to surgery, restart day after   J Gunda Maqueda MD

## 2018-12-05 ENCOUNTER — Ambulatory Visit: Payer: Medicare Other | Admitting: Vascular Surgery

## 2018-12-06 ENCOUNTER — Other Ambulatory Visit (HOSPITAL_COMMUNITY)
Admission: RE | Admit: 2018-12-06 | Discharge: 2018-12-06 | Disposition: A | Discharge status: Home / Self Care | Payer: Medicare Other | Source: Ambulatory Visit | Attending: Surgery | Admitting: Surgery

## 2018-12-06 DIAGNOSIS — Z1159 Encounter for screening for other viral diseases: Secondary | ICD-10-CM | POA: Insufficient documentation

## 2018-12-06 DIAGNOSIS — Z01812 Encounter for preprocedural laboratory examination: Secondary | ICD-10-CM | POA: Diagnosis not present

## 2018-12-06 LAB — SARS CORONAVIRUS 2 (TAT 6-24 HRS): SARS Coronavirus 2: NEGATIVE

## 2018-12-09 ENCOUNTER — Other Ambulatory Visit: Payer: Self-pay

## 2018-12-09 ENCOUNTER — Encounter: Payer: Self-pay | Admitting: Hematology and Oncology

## 2018-12-09 ENCOUNTER — Encounter (HOSPITAL_COMMUNITY): Payer: Self-pay

## 2018-12-09 ENCOUNTER — Encounter (HOSPITAL_BASED_OUTPATIENT_CLINIC_OR_DEPARTMENT_OTHER)
Admission: RE | Admit: 2018-12-09 | Discharge: 2018-12-09 | Disposition: A | Discharge status: Home / Self Care | Payer: Medicare Other | Source: Ambulatory Visit | Attending: Surgery | Admitting: Surgery

## 2018-12-09 ENCOUNTER — Ambulatory Visit
Admission: RE | Admit: 2018-12-09 | Discharge: 2018-12-09 | Disposition: A | Discharge status: Home / Self Care | Payer: Medicare Other | Source: Ambulatory Visit | Attending: Surgery | Admitting: Surgery

## 2018-12-09 DIAGNOSIS — I10 Essential (primary) hypertension: Secondary | ICD-10-CM | POA: Diagnosis not present

## 2018-12-09 DIAGNOSIS — C50912 Malignant neoplasm of unspecified site of left female breast: Secondary | ICD-10-CM

## 2018-12-09 DIAGNOSIS — D649 Anemia, unspecified: Secondary | ICD-10-CM | POA: Diagnosis not present

## 2018-12-09 DIAGNOSIS — Z794 Long term (current) use of insulin: Secondary | ICD-10-CM | POA: Diagnosis not present

## 2018-12-09 DIAGNOSIS — Z170 Estrogen receptor positive status [ER+]: Secondary | ICD-10-CM | POA: Diagnosis not present

## 2018-12-09 DIAGNOSIS — Z6841 Body Mass Index (BMI) 40.0 and over, adult: Secondary | ICD-10-CM | POA: Diagnosis not present

## 2018-12-09 DIAGNOSIS — R928 Other abnormal and inconclusive findings on diagnostic imaging of breast: Secondary | ICD-10-CM | POA: Diagnosis not present

## 2018-12-09 DIAGNOSIS — Z9221 Personal history of antineoplastic chemotherapy: Secondary | ICD-10-CM | POA: Diagnosis not present

## 2018-12-09 DIAGNOSIS — I872 Venous insufficiency (chronic) (peripheral): Secondary | ICD-10-CM | POA: Diagnosis not present

## 2018-12-09 DIAGNOSIS — L659 Nonscarring hair loss, unspecified: Secondary | ICD-10-CM | POA: Diagnosis not present

## 2018-12-09 DIAGNOSIS — I4891 Unspecified atrial fibrillation: Secondary | ICD-10-CM | POA: Diagnosis not present

## 2018-12-09 DIAGNOSIS — E119 Type 2 diabetes mellitus without complications: Secondary | ICD-10-CM | POA: Diagnosis not present

## 2018-12-09 DIAGNOSIS — D759 Disease of blood and blood-forming organs, unspecified: Secondary | ICD-10-CM | POA: Diagnosis not present

## 2018-12-09 DIAGNOSIS — Z7901 Long term (current) use of anticoagulants: Secondary | ICD-10-CM | POA: Diagnosis not present

## 2018-12-09 DIAGNOSIS — I890 Lymphedema, not elsewhere classified: Secondary | ICD-10-CM | POA: Diagnosis not present

## 2018-12-09 LAB — COMPREHENSIVE METABOLIC PANEL
ALT: 14 U/L (ref 0–44)
AST: 20 U/L (ref 15–41)
Albumin: 3 g/dL — ABNORMAL LOW (ref 3.5–5.0)
Alkaline Phosphatase: 102 U/L (ref 38–126)
Anion gap: 9 (ref 5–15)
BUN: 14 mg/dL (ref 8–23)
CO2: 28 mmol/L (ref 22–32)
Calcium: 8 mg/dL — ABNORMAL LOW (ref 8.9–10.3)
Chloride: 100 mmol/L (ref 98–111)
Creatinine, Ser: 0.81 mg/dL (ref 0.44–1.00)
GFR calc Af Amer: >60 mL/min (ref >60)
GFR calc non Af Amer: >60 mL/min (ref >60)
Glucose, Bld: 120 mg/dL — ABNORMAL HIGH (ref 70–99)
Potassium: 4.1 mmol/L (ref 3.5–5.1)
Sodium: 137 mmol/L (ref 135–145)
Total Bilirubin: 0.5 mg/dL (ref 0.3–1.2)
Total Protein: 6.3 g/dL — ABNORMAL LOW (ref 6.5–8.1)

## 2018-12-09 LAB — CBC WITH DIFFERENTIAL/PLATELET
Abs Immature Granulocytes: 0.03 10*3/uL (ref 0.00–0.07)
Basophils Absolute: 0 10*3/uL (ref 0.0–0.1)
Basophils Relative: 0 %
Eosinophils Absolute: 0.2 10*3/uL (ref 0.0–0.5)
Eosinophils Relative: 2 %
HCT: 33.9 % — ABNORMAL LOW (ref 36.0–46.0)
Hemoglobin: 10.3 g/dL — ABNORMAL LOW (ref 12.0–15.0)
Immature Granulocytes: 0 %
Lymphocytes Relative: 12 %
Lymphs Abs: 1.3 10*3/uL (ref 0.7–4.0)
MCH: 26.7 pg (ref 26.0–34.0)
MCHC: 30.4 g/dL (ref 30.0–36.0)
MCV: 87.8 fL (ref 80.0–100.0)
Monocytes Absolute: 0.7 10*3/uL (ref 0.1–1.0)
Monocytes Relative: 7 %
Neutro Abs: 8.3 10*3/uL — ABNORMAL HIGH (ref 1.7–7.7)
Neutrophils Relative %: 79 %
Platelets: 265 10*3/uL (ref 150–400)
RBC: 3.86 MIL/uL — ABNORMAL LOW (ref 3.87–5.11)
RDW: 14.6 % (ref 11.5–15.5)
WBC: 10.6 10*3/uL — ABNORMAL HIGH (ref 4.0–10.5)
nRBC: 0 % (ref 0.0–0.2)

## 2018-12-09 MED ORDER — PANTOPRAZOLE SODIUM 40 MG PO TBEC: 40.0000 mg | DELAYED_RELEASE_TABLET | Freq: Every day | ORAL | 3 refills | Status: DC

## 2018-12-09 NOTE — Progress Notes (Signed)
Anesthesia consult per Dr. Lanetta Inch, recommends surgery to be done at [facility], RN contacted Dr. Josetta Huddle office, pt aware of plan of care.

## 2018-12-09 NOTE — Anesthesia Preprocedure Evaluation (Addendum)
Anesthesia Evaluation  Patient identified by MRN, date of birth, ID band Patient awake    Reviewed: Allergy & Precautions, H&P , NPO status , Patient's Chart, lab work & pertinent test results  Airway Mallampati: II  TM Distance: >3 FB Neck ROM: Full    Dental no notable dental hx. (+) Edentulous Upper, Edentulous Lower, Dental Advisory Given   Pulmonary neg pulmonary ROS,    Pulmonary exam normal breath sounds clear to auscultation       Cardiovascular Exercise Tolerance: Good hypertension, Pt. on medications + dysrhythmias Atrial Fibrillation  Rhythm:Regular Rate:Normal     Neuro/Psych Anxiety negative neurological ROS  negative psych ROS   GI/Hepatic negative GI ROS, Neg liver ROS,   Endo/Other  diabetes, Type 2, Insulin Dependent, Oral Hypoglycemic AgentsMorbid obesity  Renal/GU negative Renal ROS  negative genitourinary   Musculoskeletal   Abdominal   Peds  Hematology  (+) Blood dyscrasia, anemia ,   Anesthesia Other Findings   Reproductive/Obstetrics negative OB ROS                           Anesthesia Physical Anesthesia Plan  ASA: III  Anesthesia Plan: General   Post-op Pain Management:  Regional for Post-op pain   Induction: Intravenous  PONV Risk Score and Plan: 4 or greater and Ondansetron, Midazolam and Treatment may vary due to age or medical condition  Airway Management Planned: Oral ETT  Additional Equipment:   Intra-op Plan:   Post-operative Plan: Extubation in OR  Informed Consent: I have reviewed the patients History and Physical, chart, labs and discussed the procedure including the risks, benefits and alternatives for the proposed anesthesia with the patient or authorized representative who has indicated his/her understanding and acceptance.     Dental advisory given  Plan Discussed with: CRNA  Anesthesia Plan Comments:         Anesthesia Quick  Evaluation

## 2018-12-10 ENCOUNTER — Encounter (HOSPITAL_COMMUNITY)
Admission: RE | Disposition: A | Discharge status: Home / Self Care | Payer: Self-pay | Source: Home / Self Care | Attending: Surgery

## 2018-12-10 ENCOUNTER — Ambulatory Visit (HOSPITAL_COMMUNITY): Payer: Medicare Other | Admitting: Certified Registered"

## 2018-12-10 ENCOUNTER — Ambulatory Visit
Admission: RE | Admit: 2018-12-10 | Discharge: 2018-12-10 | Disposition: A | Discharge status: Home / Self Care | Payer: Medicare Other | Source: Ambulatory Visit | Attending: Surgery | Admitting: Surgery

## 2018-12-10 ENCOUNTER — Ambulatory Visit (HOSPITAL_COMMUNITY)
Admission: RE | Admit: 2018-12-10 | Discharge: 2018-12-10 | Disposition: A | Discharge status: Home / Self Care | Payer: Medicare Other | Attending: Surgery | Admitting: Surgery

## 2018-12-10 ENCOUNTER — Encounter (HOSPITAL_COMMUNITY): Payer: Self-pay | Admitting: Anesthesiology

## 2018-12-10 ENCOUNTER — Ambulatory Visit (HOSPITAL_COMMUNITY)
Admission: RE | Admit: 2018-12-10 | Discharge: 2018-12-10 | Disposition: A | Discharge status: Home / Self Care | Payer: Medicare Other | Source: Ambulatory Visit | Attending: Surgery | Admitting: Surgery

## 2018-12-10 DIAGNOSIS — Z794 Long term (current) use of insulin: Secondary | ICD-10-CM | POA: Insufficient documentation

## 2018-12-10 DIAGNOSIS — I4891 Unspecified atrial fibrillation: Secondary | ICD-10-CM | POA: Insufficient documentation

## 2018-12-10 DIAGNOSIS — C50912 Malignant neoplasm of unspecified site of left female breast: Secondary | ICD-10-CM

## 2018-12-10 DIAGNOSIS — E119 Type 2 diabetes mellitus without complications: Secondary | ICD-10-CM | POA: Diagnosis not present

## 2018-12-10 DIAGNOSIS — D759 Disease of blood and blood-forming organs, unspecified: Secondary | ICD-10-CM | POA: Insufficient documentation

## 2018-12-10 DIAGNOSIS — Z7901 Long term (current) use of anticoagulants: Secondary | ICD-10-CM | POA: Diagnosis not present

## 2018-12-10 DIAGNOSIS — Z6841 Body Mass Index (BMI) 40.0 and over, adult: Secondary | ICD-10-CM | POA: Insufficient documentation

## 2018-12-10 DIAGNOSIS — Z170 Estrogen receptor positive status [ER+]: Secondary | ICD-10-CM | POA: Diagnosis not present

## 2018-12-10 DIAGNOSIS — Z9221 Personal history of antineoplastic chemotherapy: Secondary | ICD-10-CM | POA: Insufficient documentation

## 2018-12-10 DIAGNOSIS — I872 Venous insufficiency (chronic) (peripheral): Secondary | ICD-10-CM | POA: Insufficient documentation

## 2018-12-10 DIAGNOSIS — I10 Essential (primary) hypertension: Secondary | ICD-10-CM | POA: Insufficient documentation

## 2018-12-10 DIAGNOSIS — G8918 Other acute postprocedural pain: Secondary | ICD-10-CM | POA: Diagnosis not present

## 2018-12-10 DIAGNOSIS — L659 Nonscarring hair loss, unspecified: Secondary | ICD-10-CM | POA: Insufficient documentation

## 2018-12-10 DIAGNOSIS — R928 Other abnormal and inconclusive findings on diagnostic imaging of breast: Secondary | ICD-10-CM | POA: Diagnosis not present

## 2018-12-10 DIAGNOSIS — D649 Anemia, unspecified: Secondary | ICD-10-CM | POA: Insufficient documentation

## 2018-12-10 DIAGNOSIS — I890 Lymphedema, not elsewhere classified: Secondary | ICD-10-CM | POA: Insufficient documentation

## 2018-12-10 HISTORY — DX: Chronic kidney disease, unspecified: N18.9

## 2018-12-10 HISTORY — PX: BREAST LUMPECTOMY WITH RADIOACTIVE SEED AND SENTINEL LYMPH NODE BIOPSY: SHX6550

## 2018-12-10 HISTORY — DX: Dyspnea, unspecified: R06.00

## 2018-12-10 HISTORY — DX: Anxiety disorder, unspecified: F41.9

## 2018-12-10 LAB — GLUCOSE, CAPILLARY
Glucose-Capillary: 117 mg/dL — ABNORMAL HIGH (ref 70–99)
Glucose-Capillary: 119 mg/dL — ABNORMAL HIGH (ref 70–99)

## 2018-12-10 LAB — PROTIME-INR
INR: 1.2 (ref 0.8–1.2)
Prothrombin Time: 15.5 seconds — ABNORMAL HIGH (ref 11.4–15.2)

## 2018-12-10 SURGERY — BREAST LUMPECTOMY WITH RADIOACTIVE SEED AND SENTINEL LYMPH NODE BIOPSY
Anesthesia: General | Site: Breast | Laterality: Left

## 2018-12-10 MED ORDER — MIDAZOLAM HCL 2 MG/2ML IJ SOLN
INTRAMUSCULAR | Status: AC
  Filled 2018-12-10: qty 2

## 2018-12-10 MED ORDER — PROPOFOL 10 MG/ML IV BOLUS
INTRAVENOUS | Status: AC
  Filled 2018-12-10: qty 20

## 2018-12-10 MED ORDER — PROPOFOL 10 MG/ML IV BOLUS
INTRAVENOUS | Status: DC | PRN
  Administered 2018-12-10: 30 mg via INTRAVENOUS
  Administered 2018-12-10: 20 mg via INTRAVENOUS
  Administered 2018-12-10: 100 mg via INTRAVENOUS

## 2018-12-10 MED ORDER — METHYLENE BLUE 0.5 % INJ SOLN
INTRAVENOUS | Status: AC
  Filled 2018-12-10: qty 10

## 2018-12-10 MED ORDER — SUCCINYLCHOLINE CHLORIDE 200 MG/10ML IV SOSY
PREFILLED_SYRINGE | INTRAVENOUS | Status: DC | PRN
  Administered 2018-12-10: 100 mg via INTRAVENOUS

## 2018-12-10 MED ORDER — BUPIVACAINE-EPINEPHRINE (PF) 0.25% -1:200000 IJ SOLN
INTRAMUSCULAR | Status: AC
  Filled 2018-12-10: qty 30

## 2018-12-10 MED ORDER — DEXAMETHASONE SODIUM PHOSPHATE 10 MG/ML IJ SOLN
INTRAMUSCULAR | Status: AC
  Filled 2018-12-10: qty 1

## 2018-12-10 MED ORDER — SUCCINYLCHOLINE CHLORIDE 200 MG/10ML IV SOSY
PREFILLED_SYRINGE | INTRAVENOUS | Status: AC
  Filled 2018-12-10: qty 10

## 2018-12-10 MED ORDER — DEXTROSE 5 % IV SOLN
3.0000 g | INTRAVENOUS | Status: AC
  Administered 2018-12-10: 3 g via INTRAVENOUS
  Filled 2018-12-10 (×2): qty 3000

## 2018-12-10 MED ORDER — TRAMADOL HCL 50 MG PO TABS
ORAL_TABLET | ORAL | Status: AC
  Filled 2018-12-10: qty 1

## 2018-12-10 MED ORDER — MIDAZOLAM HCL 2 MG/2ML IJ SOLN
1.0000 mg | INTRAMUSCULAR | Status: DC | PRN
  Administered 2018-12-10: 1 mg via INTRAVENOUS
  Filled 2018-12-10: qty 2

## 2018-12-10 MED ORDER — SODIUM CHLORIDE (PF) 0.9 % IJ SOLN
INTRAVENOUS | Status: DC | PRN
  Administered 2018-12-10: 5 mL

## 2018-12-10 MED ORDER — TRAMADOL HCL 50 MG PO TABS: 50.0000 mg | ORAL_TABLET | Freq: Four times a day (QID) | ORAL | 0 refills | Status: DC | PRN

## 2018-12-10 MED ORDER — BUPIVACAINE-EPINEPHRINE (PF) 0.5% -1:200000 IJ SOLN
INTRAMUSCULAR | Status: DC | PRN
  Administered 2018-12-10: 20 mL

## 2018-12-10 MED ORDER — LACTATED RINGERS IV SOLN
INTRAVENOUS | Status: DC
  Administered 2018-12-10: 08:00:00 via INTRAVENOUS

## 2018-12-10 MED ORDER — ONDANSETRON HCL 4 MG/2ML IJ SOLN
INTRAMUSCULAR | Status: DC | PRN
  Administered 2018-12-10: 4 mg via INTRAVENOUS

## 2018-12-10 MED ORDER — LIDOCAINE 2% (20 MG/ML) 5 ML SYRINGE
INTRAMUSCULAR | Status: AC
  Filled 2018-12-10: qty 5

## 2018-12-10 MED ORDER — SODIUM CHLORIDE (PF) 0.9 % IJ SOLN
INTRAMUSCULAR | Status: AC
  Filled 2018-12-10: qty 10

## 2018-12-10 MED ORDER — DEXAMETHASONE SODIUM PHOSPHATE 4 MG/ML IJ SOLN
INTRAMUSCULAR | Status: DC | PRN
  Administered 2018-12-10: 4 mg via INTRAVENOUS

## 2018-12-10 MED ORDER — ACETAMINOPHEN 500 MG PO TABS
ORAL_TABLET | ORAL | Status: AC
  Administered 2018-12-10: 1000 mg via ORAL
  Filled 2018-12-10: qty 2

## 2018-12-10 MED ORDER — ONDANSETRON HCL 4 MG/2ML IJ SOLN
INTRAMUSCULAR | Status: AC
  Filled 2018-12-10: qty 2

## 2018-12-10 MED ORDER — HEMOSTATIC AGENTS (NO CHARGE) OPTIME
TOPICAL | Status: DC | PRN
  Administered 2018-12-10: 1 via TOPICAL

## 2018-12-10 MED ORDER — LIDOCAINE 2% (20 MG/ML) 5 ML SYRINGE
INTRAMUSCULAR | Status: DC | PRN
  Administered 2018-12-10: 40 mg via INTRAVENOUS

## 2018-12-10 MED ORDER — BUPIVACAINE LIPOSOME 1.3 % IJ SUSP
INTRAMUSCULAR | Status: DC | PRN
  Administered 2018-12-10: 10 mL

## 2018-12-10 MED ORDER — ACETAMINOPHEN 500 MG PO TABS
1000.0000 mg | ORAL_TABLET | ORAL | Status: AC
  Administered 2018-12-10: 08:00:00 1000 mg via ORAL

## 2018-12-10 MED ORDER — BUPIVACAINE-EPINEPHRINE 0.25% -1:200000 IJ SOLN
INTRAMUSCULAR | Status: DC | PRN
  Administered 2018-12-10: 20 mL

## 2018-12-10 MED ORDER — PHENYLEPHRINE 40 MCG/ML (10ML) SYRINGE FOR IV PUSH (FOR BLOOD PRESSURE SUPPORT)
PREFILLED_SYRINGE | INTRAVENOUS | Status: AC
  Filled 2018-12-10: qty 10

## 2018-12-10 MED ORDER — HYDROMORPHONE HCL 1 MG/ML IJ SOLN: 0.2500 mg | INTRAMUSCULAR | Status: DC | PRN

## 2018-12-10 MED ORDER — FENTANYL CITRATE (PF) 100 MCG/2ML IJ SOLN
50.0000 ug | INTRAMUSCULAR | Status: DC | PRN
  Administered 2018-12-10: 100 ug via INTRAVENOUS
  Filled 2018-12-10: qty 2

## 2018-12-10 MED ORDER — 0.9 % SODIUM CHLORIDE (POUR BTL) OPTIME
TOPICAL | Status: DC | PRN
  Administered 2018-12-10: 1000 mL

## 2018-12-10 MED ORDER — TECHNETIUM TC 99M SULFUR COLLOID FILTERED
1.0000 | Freq: Once | INTRAVENOUS | Status: AC | PRN
  Administered 2018-12-10: 09:00:00 1 via INTRADERMAL

## 2018-12-10 MED ORDER — CHLORHEXIDINE GLUCONATE CLOTH 2 % EX PADS: 6.0000 | MEDICATED_PAD | Freq: Once | CUTANEOUS | Status: DC

## 2018-12-10 MED ORDER — FENTANYL CITRATE (PF) 250 MCG/5ML IJ SOLN
INTRAMUSCULAR | Status: AC
  Filled 2018-12-10: qty 5

## 2018-12-10 MED ORDER — PHENYLEPHRINE 40 MCG/ML (10ML) SYRINGE FOR IV PUSH (FOR BLOOD PRESSURE SUPPORT)
PREFILLED_SYRINGE | INTRAVENOUS | Status: DC | PRN
  Administered 2018-12-10 (×3): 80 ug via INTRAVENOUS

## 2018-12-10 MED ORDER — CEFAZOLIN SODIUM-DEXTROSE 2-4 GM/100ML-% IV SOLN
INTRAVENOUS | Status: AC
  Filled 2018-12-10: qty 100

## 2018-12-10 MED ORDER — TRAMADOL HCL 50 MG PO TABS
50.0000 mg | ORAL_TABLET | Freq: Four times a day (QID) | ORAL | Status: DC | PRN
  Administered 2018-12-10: 50 mg via ORAL

## 2018-12-10 MED ORDER — GABAPENTIN 300 MG PO CAPS
300.0000 mg | ORAL_CAPSULE | ORAL | Status: AC
  Administered 2018-12-10: 300 mg via ORAL
  Filled 2018-12-10: qty 1

## 2018-12-10 SURGICAL SUPPLY — 46 items
ADH SKN CLS APL DERMABOND .7 (GAUZE/BANDAGES/DRESSINGS) ×1
APL PRP STRL LF DISP 70% ISPRP (MISCELLANEOUS) ×1
APPLIER CLIP 9.375 MED OPEN (MISCELLANEOUS) ×3
APR CLP MED 9.3 20 MLT OPN (MISCELLANEOUS) ×1
BINDER BREAST 3XL (GAUZE/BANDAGES/DRESSINGS) ×2 IMPLANT
BINDER BREAST LRG (GAUZE/BANDAGES/DRESSINGS) IMPLANT
BINDER BREAST XLRG (GAUZE/BANDAGES/DRESSINGS) IMPLANT
CANISTER SUCT 3000ML PPV (MISCELLANEOUS) ×3 IMPLANT
CHLORAPREP W/TINT 26 (MISCELLANEOUS) ×3 IMPLANT
CLIP APPLIE 9.375 MED OPEN (MISCELLANEOUS) ×1 IMPLANT
CONT SPEC 4OZ CLIKSEAL STRL BL (MISCELLANEOUS) ×3 IMPLANT
COVER PROBE W GEL 5X96 (DRAPES) ×3 IMPLANT
COVER SURGICAL LIGHT HANDLE (MISCELLANEOUS) ×3 IMPLANT
COVER WAND RF STERILE (DRAPES) IMPLANT
DERMABOND ADVANCED (GAUZE/BANDAGES/DRESSINGS) ×2
DERMABOND ADVANCED .7 DNX12 (GAUZE/BANDAGES/DRESSINGS) ×1 IMPLANT
DEVICE DUBIN SPECIMEN MAMMOGRA (MISCELLANEOUS) ×3 IMPLANT
DRAPE CHEST BREAST 15X10 FENES (DRAPES) ×3 IMPLANT
ELECT CAUTERY BLADE 6.4 (BLADE) ×3 IMPLANT
ELECT REM PT RETURN 9FT ADLT (ELECTROSURGICAL) ×3
ELECTRODE REM PT RTRN 9FT ADLT (ELECTROSURGICAL) ×1 IMPLANT
GLOVE BIO SURGEON STRL SZ8 (GLOVE) ×3 IMPLANT
GLOVE BIOGEL PI IND STRL 8 (GLOVE) ×1 IMPLANT
GLOVE BIOGEL PI INDICATOR 8 (GLOVE) ×2
GOWN STRL REUS W/ TWL LRG LVL3 (GOWN DISPOSABLE) ×1 IMPLANT
GOWN STRL REUS W/ TWL XL LVL3 (GOWN DISPOSABLE) ×1 IMPLANT
GOWN STRL REUS W/TWL LRG LVL3 (GOWN DISPOSABLE) ×3
GOWN STRL REUS W/TWL XL LVL3 (GOWN DISPOSABLE) ×3
HEMOSTAT SNOW SURGICEL 2X4 (HEMOSTASIS) ×2 IMPLANT
KIT BASIN OR (CUSTOM PROCEDURE TRAY) ×3 IMPLANT
KIT MARKER MARGIN INK (KITS) ×3 IMPLANT
LIGHT WAVEGUIDE WIDE FLAT (MISCELLANEOUS) IMPLANT
NDL 18GX1X1/2 (RX/OR ONLY) (NEEDLE) IMPLANT
NDL FILTER BLUNT 18X1 1/2 (NEEDLE) IMPLANT
NDL HYPO 25GX1X1/2 BEV (NEEDLE) ×1 IMPLANT
NEEDLE 18GX1X1/2 (RX/OR ONLY) (NEEDLE) ×3 IMPLANT
NEEDLE FILTER BLUNT 18X 1/2SAF (NEEDLE) ×2
NEEDLE FILTER BLUNT 18X1 1/2 (NEEDLE) ×1 IMPLANT
NEEDLE HYPO 25GX1X1/2 BEV (NEEDLE) ×3 IMPLANT
NS IRRIG 1000ML POUR BTL (IV SOLUTION) ×3 IMPLANT
PACK GENERAL/GYN (CUSTOM PROCEDURE TRAY) ×3 IMPLANT
SUT MNCRL AB 4-0 PS2 18 (SUTURE) ×5 IMPLANT
SUT VIC AB 3-0 SH 18 (SUTURE) ×5 IMPLANT
SYR CONTROL 10ML LL (SYRINGE) ×3 IMPLANT
TOWEL GREEN STERILE (TOWEL DISPOSABLE) ×3 IMPLANT
TOWEL GREEN STERILE FF (TOWEL DISPOSABLE) ×3 IMPLANT

## 2018-12-10 NOTE — Op Note (Signed)
Preoperative diagnosis: Stage I left breast cancer  Postoperative diagnosis: Same  Procedure: Left breast seed localized lumpectomy with left axillary sentinel lymph node mapping using methylene blue dye  Surgeon: Erroll Luna, MD  Anesthesia: General with pectoral block and 0.25% Sensorcaine local with epinephrine  EBL: 10 cc  Specimen: Left breast mass with seed and clip verified by Faxitron with grossly negative margins and 3 left axillary sentinel nodes blue and hot  Drains: None  IV fluids: Per anesthesia record  Indications for procedure: The patient is a 74 year old female who is completed chemotherapy and is here for a left breast lumpectomy and sentinel lymph node mapping for treatment of her stage I left breast cancer.The procedure has been discussed with the patient. Alternatives to surgery have been discussed with the patient.  Risks of surgery include bleeding,  Infection,  Seroma formation, death,  and the need for further surgery.   The patient understands and wishes to proceed.Sentinel lymph node mapping and dissection has been discussed with the patient.  Risk of bleeding,  Infection,  Seroma formation,  Additional procedures,,  Shoulder weakness ,  Shoulder stiffness,  Nerve and blood vessel injury and reaction to the mapping dyes have been discussed.  Alternatives to surgery have been discussed with the patient.  The patient agrees to proceed.   Description of procedure: The patient was met in the holding area.  Questions were answered.  She underwent pectoral block per anesthesia and the seed was verified by neoprobe to be in the left breast.  She then underwent injection with technetium sulfur colloid.  All questions were answered.  She was placed supine on the OR table upon arriving in the operating room.  After induction of general esthesia the left breast was prepped and draped in sterile fashion timeout was done.  She received appropriate preoperative antibiotics.  4  cc of methylene blue dye were injected in a subareolar position and massaged for 5 minutes.  Neoprobe was used and seed was localized in the left breast adjacent to the upper portion of the nipple were complex.  Curvilinear incision was made along the border of the nipple were complex and all tissue around the seed and clip were excised with grossly negative margins.  Hemostasis achieved with cautery.  The cavity was clipped and closed with 3-0 Vicryl and 4-0 Monocryl.  The axillary node was addressed.  Neoprobe was used to identify the hotspot left axilla.  A 4 cm incision was made in the left axillary hairline dissection was carried down into the level 1 contents of the axillary basin.  3 blue and hot sentinel nodes identified and excised.  Background counts approached 0.  Surgicel snow was placed after assuring hemostasis with cautery.  The wound was closed with 3-0 Vicryl and 4-0 Monocryl.  Dermabond applied.  All final counts found to be correct.  The patient was then awoke extubated taken recovery in satisfactory condition.

## 2018-12-10 NOTE — Interval H&P Note (Signed)
History and Physical Interval Note:  12/10/2018 9:00 AM  Destiny Nash  has presented today for surgery, with the diagnosis of LEFT BREAST CANCER.  The various methods of treatment have been discussed with the patient and family. After consideration of risks, benefits and other options for treatment, the patient has consented to  Procedure(s): LEFT BREAST LUMPECTOMY WITH RADIOACTIVE SEED AND LEFT SENTINEL LYMPH NODE MAPPING (Left) as a surgical intervention.  The patient's history has been reviewed, patient examined, no change in status, stable for surgery.  I have reviewed the patient's chart and labs.  Questions were answered to the patient's satisfaction.     [place]

## 2018-12-10 NOTE — Transfer of Care (Signed)
Immediate Anesthesia Transfer of Care Note  Patient: Destiny Nash  Procedure(s) Performed: LEFT BREAST LUMPECTOMY WITH RADIOACTIVE SEED AND LEFT SENTINEL LYMPH NODE MAPPING (Left Breast)  Patient Location: PACU  Anesthesia Type:General  Level of Consciousness: awake, oriented and patient cooperative  Airway & Oxygen Therapy: Patient Spontanous Breathing and Patient connected to face mask oxygen  Post-op Assessment: Report given to RN and Post -op Vital signs reviewed and stable  Post vital signs: Reviewed  Last Vitals:  Vitals Value Taken Time  BP 140/88 12/10/18 1026  Temp    Pulse 90 12/10/18 1028  Resp 24 12/10/18 1028  SpO2 100 % 12/10/18 1028  Vitals shown include unvalidated device data.  Last Pain:  Vitals:   12/10/18 0850  TempSrc:   PainSc: 0-No pain      Patients Stated Pain Goal: 4 (96/75/91 [id number])  Complications: No apparent anesthesia complications

## 2018-12-10 NOTE — Anesthesia Procedure Notes (Signed)
Anesthesia Regional Block: Pectoralis block   Pre-Anesthetic Checklist: ,, timeout performed, Correct Patient, Correct Site, Correct Laterality, Correct Procedure, Correct Position, site marked, Risks and benefits discussed, pre-op evaluation,  At surgeon's request and post-op pain management  Laterality: Left  Prep: Maximum Sterile Barrier Precautions used, chloraprep       Needles:  Injection technique: Single-shot  Needle Type: Echogenic Stimulator Needle     Needle Length: 9cm  Needle Gauge: 21     Additional Needles:   Procedures:,,,, ultrasound used (permanent image in chart),,,,  Narrative:  Start time: 12/10/2018 8:29 AM End time: 12/10/2018 8:39 AM Injection made incrementally with aspirations every 5 mL. Anesthesiologist: Roderic Palau, MD  Additional Notes: 2% Lidocaine skin wheel.

## 2018-12-10 NOTE — Discharge Instructions (Signed)
[facility] °Office Phone Number [phone] ° °BREAST BIOPSY/ PARTIAL MASTECTOMY: POST OP INSTRUCTIONS ° °Always review your discharge instruction sheet given to you by the facility where your surgery was performed. ° °IF YOU HAVE DISABILITY OR FAMILY LEAVE FORMS, YOU MUST BRING THEM TO THE OFFICE FOR PROCESSING.  DO NOT GIVE THEM TO YOUR DOCTOR. ° °1. A prescription for pain medication may be given to you upon discharge.  Take your pain medication as prescribed, if needed.  If narcotic pain medicine is not needed, then you may take acetaminophen (Tylenol) or ibuprofen (Advil) as needed. °2. Take your usually prescribed medications unless otherwise directed °3. If you need a refill on your pain medication, please contact your pharmacy.  They will contact our office to request authorization.  Prescriptions will not be filled after 5pm or on week-ends. °4. You should eat very light the first 24 hours after surgery, such as soup, crackers, pudding, etc.  Resume your normal diet the day after surgery. °5. Most patients will experience some swelling and bruising in the breast.  Ice packs and a good support bra will help.  Swelling and bruising can take several days to resolve.  °6. It is common to experience some constipation if taking pain medication after surgery.  Increasing fluid intake and taking a stool softener will usually help or prevent this problem from occurring.  A mild laxative (Milk of Magnesia or Miralax) should be taken according to package directions if there are no bowel movements after 48 hours. °7. Unless discharge instructions indicate otherwise, you may remove your bandages 24-48 hours after surgery, and you may shower at that time.  You may have steri-strips (small skin tapes) in place directly over the incision.  These strips should be left on the skin for 7-10 days.  If your surgeon used skin glue on the incision, you may shower in 24 hours.  The glue will flake off over the  next 2-3 weeks.  Any sutures or staples will be removed at the office during your follow-up visit. °8. ACTIVITIES:  You may resume regular daily activities (gradually increasing) beginning the next day.  Wearing a good support bra or sports bra minimizes pain and swelling.  You may have sexual intercourse when it is comfortable. °a. You may drive when you no longer are taking prescription pain medication, you can comfortably wear a seatbelt, and you can safely maneuver your car and apply brakes. °b. RETURN TO WORK:  ______________________________________________________________________________________ °9. You should see your doctor in the office for a follow-up appointment approximately two weeks after your surgery.  Your doctor’s nurse will typically make your follow-up appointment when she calls you with your pathology report.  Expect your pathology report 2-3 business days after your surgery.  You may call to check if you do not hear from us after three days. °10. OTHER INSTRUCTIONS: _______________________________________________________________________________________________ _____________________________________________________________________________________________________________________________________ °_____________________________________________________________________________________________________________________________________ °_____________________________________________________________________________________________________________________________________ ° °WHEN TO CALL YOUR DOCTOR: °1. Fever over 101.0 °2. Nausea and/or vomiting. °3. Extreme swelling or bruising. °4. Continued bleeding from incision. °5. Increased pain, redness, or drainage from the incision. ° °The clinic staff is available to answer your questions during regular business hours.  Please don’t hesitate to call and ask to speak to one of the nurses for clinical concerns.  If you have a medical emergency, go to the nearest  emergency room or call 911.  A surgeon from [facility] is always on call at the hospital. ° °For further questions, please visit [email or web address]  °

## 2018-12-10 NOTE — Anesthesia Procedure Notes (Signed)
Procedure Name: Intubation Date/Time: 12/10/2018 9:15 AM Performed by: Jenne Campus, CRNA Pre-anesthesia Checklist: Patient identified, Emergency Drugs available, Suction available and Patient being monitored Patient Re-evaluated:Patient Re-evaluated prior to induction Oxygen Delivery Method: Circle System Utilized Preoxygenation: Pre-oxygenation with 100% oxygen Induction Type: IV induction Laryngoscope Size: Miller and 2 Grade View: Grade I Tube type: Oral Tube size: 7.0 mm Number of attempts: 1 Airway Equipment and Method: Stylet and Oral airway Placement Confirmation: ETT inserted through vocal cords under direct vision,  positive ETCO2 and breath sounds checked- equal and bilateral Secured at: 22 cm Tube secured with: Tape Dental Injury: Teeth and Oropharynx as per pre-operative assessment

## 2018-12-10 NOTE — Anesthesia Postprocedure Evaluation (Signed)
Anesthesia Post Note  Patient: Destiny Nash  Procedure(s) Performed: LEFT BREAST LUMPECTOMY WITH RADIOACTIVE SEED AND LEFT SENTINEL LYMPH NODE MAPPING (Left Breast)     Patient location during evaluation: PACU Anesthesia Type: General and Regional Level of consciousness: awake and alert Pain management: pain level controlled Vital Signs Assessment: post-procedure vital signs reviewed and stable Respiratory status: spontaneous breathing, nonlabored ventilation and respiratory function stable Cardiovascular status: blood pressure returned to baseline and stable Postop Assessment: no apparent nausea or vomiting Anesthetic complications: no    Last Vitals:  Vitals:   12/10/18 1037 12/10/18 1055  BP:  (!) 157/79  Pulse: 95 99  Resp: 20 20  Temp:    SpO2: 95% 97%    Last Pain:  Vitals:   12/10/18 1037  TempSrc:   PainSc: 7                  Abigael Mogle,W. EDMOND

## 2018-12-11 ENCOUNTER — Encounter (HOSPITAL_COMMUNITY): Payer: Self-pay | Admitting: Surgery

## 2018-12-11 ENCOUNTER — Other Ambulatory Visit: Payer: Self-pay

## 2018-12-11 DIAGNOSIS — Z7901 Long term (current) use of anticoagulants: Secondary | ICD-10-CM | POA: Diagnosis not present

## 2018-12-11 DIAGNOSIS — C50412 Malignant neoplasm of upper-outer quadrant of left female breast: Secondary | ICD-10-CM | POA: Diagnosis not present

## 2018-12-11 DIAGNOSIS — Z170 Estrogen receptor positive status [ER+]: Secondary | ICD-10-CM | POA: Diagnosis not present

## 2018-12-11 DIAGNOSIS — Z794 Long term (current) use of insulin: Secondary | ICD-10-CM | POA: Diagnosis not present

## 2018-12-11 DIAGNOSIS — Z483 Aftercare following surgery for neoplasm: Secondary | ICD-10-CM | POA: Diagnosis not present

## 2018-12-11 DIAGNOSIS — I10 Essential (primary) hypertension: Secondary | ICD-10-CM | POA: Diagnosis not present

## 2018-12-11 DIAGNOSIS — M6281 Muscle weakness (generalized): Secondary | ICD-10-CM | POA: Diagnosis not present

## 2018-12-11 DIAGNOSIS — Z6841 Body Mass Index (BMI) 40.0 and over, adult: Secondary | ICD-10-CM | POA: Diagnosis not present

## 2018-12-11 DIAGNOSIS — Z4801 Encounter for change or removal of surgical wound dressing: Secondary | ICD-10-CM | POA: Diagnosis not present

## 2018-12-11 DIAGNOSIS — I4819 Other persistent atrial fibrillation: Secondary | ICD-10-CM | POA: Diagnosis not present

## 2018-12-11 DIAGNOSIS — E119 Type 2 diabetes mellitus without complications: Secondary | ICD-10-CM | POA: Diagnosis not present

## 2018-12-11 MED ORDER — LORAZEPAM 0.5 MG PO TABS: 0.5000 mg | ORAL_TABLET | Freq: Two times a day (BID) | ORAL | 0 refills | Status: DC

## 2018-12-12 ENCOUNTER — Other Ambulatory Visit: Payer: Self-pay | Admitting: *Deleted

## 2018-12-12 DIAGNOSIS — C50412 Malignant neoplasm of upper-outer quadrant of left female breast: Secondary | ICD-10-CM

## 2018-12-13 ENCOUNTER — Other Ambulatory Visit: Payer: Medicare Other

## 2018-12-13 ENCOUNTER — Ambulatory Visit: Payer: Medicare Other

## 2018-12-13 ENCOUNTER — Ambulatory Visit: Payer: Medicare Other | Admitting: Hematology and Oncology

## 2018-12-13 DIAGNOSIS — E119 Type 2 diabetes mellitus without complications: Secondary | ICD-10-CM | POA: Diagnosis not present

## 2018-12-13 DIAGNOSIS — C50412 Malignant neoplasm of upper-outer quadrant of left female breast: Secondary | ICD-10-CM | POA: Diagnosis not present

## 2018-12-13 DIAGNOSIS — Z483 Aftercare following surgery for neoplasm: Secondary | ICD-10-CM | POA: Diagnosis not present

## 2018-12-13 DIAGNOSIS — Z4801 Encounter for change or removal of surgical wound dressing: Secondary | ICD-10-CM | POA: Diagnosis not present

## 2018-12-13 DIAGNOSIS — I4819 Other persistent atrial fibrillation: Secondary | ICD-10-CM | POA: Diagnosis not present

## 2018-12-13 DIAGNOSIS — Z170 Estrogen receptor positive status [ER+]: Secondary | ICD-10-CM | POA: Diagnosis not present

## 2018-12-17 DIAGNOSIS — C50412 Malignant neoplasm of upper-outer quadrant of left female breast: Secondary | ICD-10-CM | POA: Diagnosis not present

## 2018-12-17 DIAGNOSIS — Z170 Estrogen receptor positive status [ER+]: Secondary | ICD-10-CM | POA: Diagnosis not present

## 2018-12-17 DIAGNOSIS — Z483 Aftercare following surgery for neoplasm: Secondary | ICD-10-CM | POA: Diagnosis not present

## 2018-12-17 DIAGNOSIS — E119 Type 2 diabetes mellitus without complications: Secondary | ICD-10-CM | POA: Diagnosis not present

## 2018-12-17 DIAGNOSIS — I4819 Other persistent atrial fibrillation: Secondary | ICD-10-CM | POA: Diagnosis not present

## 2018-12-17 DIAGNOSIS — Z4801 Encounter for change or removal of surgical wound dressing: Secondary | ICD-10-CM | POA: Diagnosis not present

## 2018-12-17 NOTE — Progress Notes (Signed)
Patient Care Team: [name], MD as PCP - General (Family Medicine) Harl Bowie Alphonse Guild, MD as PCP - Cardiology (Cardiology)  DIAGNOSIS:    ICD-10-CM   1. Malignant neoplasm of upper-outer quadrant of left breast in female, estrogen receptor positive ([place])  C50.412    Z17.0     SUMMARY OF ONCOLOGIC HISTORY: Oncology History  Malignant neoplasm of upper-outer quadrant of left breast in female, estrogen receptor positive ([place])  07/09/2018 Initial Diagnosis   Screening mammogram detected spiculated mass in the left breast upper outer quadrant, 2.6 cm by MRI, axilla negative, biopsy revealed grade 2 IDC with DCIS, ER 100%, PR 70%, Ki-67 10%, HER-2 3+ positive, T2N0 stage Ib clinical stage   07/25/2018 Cancer Staging   Staging form: Breast, AJCC 8th Edition - Clinical stage from 07/25/2018: Stage IB (cT2, cN0, cM0, G2, ER+, PR+, HER2+) - Signed by Nicholas Lose, MD on 07/25/2018   08/02/2018 -  Neo-Adjuvant Chemotherapy   Neoadjuvant chemotherapy with Taxol weekly with Herceptin and Perjeta every 3 weeks   10/13/2018 Genetic Testing   Negative genetic testing on the multicancer panel.  The Multi-Gene Panel offered by [facility] includes sequencing and/or deletion duplication testing of the following 85 genes: AIP, ALK, APC, ATM, AXIN2,BAP1,  BARD1, BLM, BMPR1A, BRCA1, BRCA2, BRIP1, CASR, CDC73, CDH1, CDK4, CDKN1B, CDKN1C, CDKN2A (p14ARF), CDKN2A (p16INK4a), CEBPA, CHEK2, CTNNA1, DICER1, DIS3L2, EGFR (c.2369C>T, p.Thr790Met variant only), EPCAM (Deletion/duplication testing only), FH, FLCN, GATA2, GPC3, GREM1 (Promoter region deletion/duplication testing only), HOXB13 (c.251G>A, p.Gly84Glu), HRAS, KIT, MAX, MEN1, MET, MITF (c.952G>A, p.Glu318Lys variant only), MLH1, MSH2, MSH3, MSH6, MUTYH, NBN, NF1, NF2, NTHL1, PALB2, PDGFRA, PHOX2B, PMS2, POLD1, POLE, POT1, PRKAR1A, PTCH1, PTEN, RAD50, RAD51C, RAD51D, RB1, RECQL4, RET, RNF43, RUNX1, SDHAF2, SDHA (sequence changes only), SDHB, SDHC, SDHD, SMAD4,  SMARCA4, SMARCB1, SMARCE1, STK11, SUFU, TERC, TERT, TMEM127, TP53, TSC1, TSC2, VHL, WRN and WT1.  The report date is Oct 13, 2018.   12/10/2018 Surgery   Left lumpectomy ([facility]): IDC, 1.5cm, grade 2, HER-2 negative, ER+ 100%, PR+ 80%, Ki67 5%, clear margins, 2 lymph nodes negative for carcinoma.    12/10/2018 Cancer Staging   Staging form: Breast, AJCC 8th Edition - Pathologic stage from 12/10/2018: No Stage Recommended (ypT1c, pN0, cM0, G2, ER+, PR+, HER2-) - Signed by Gardenia Phlegm, NP on 12/18/2018     CHIEF COMPLIANT: Follow-up s/p lumpectomy to review pathology   INTERVAL HISTORY: Destiny Nash is a 73 y.o. with above-mentioned history of left breast cancer who underwent neoadjuvant chemotherapy and a left lumpectomy on 12/10/18 with Dr. Brantley Stage. Pathology confirmed 1.5cmgrade 2 invasive ductal carcinoma, with clear margins and 2 lymph nodes negative, HER2 negative, ER 100%, PR 80%, Ki67 5%. She presents to the clinic today to review the pathology report and discuss further treatment.   REVIEW OF SYSTEMS:   Constitutional: Denies fevers, chills or abnormal weight loss Eyes: Denies blurriness of vision Ears, nose, mouth, throat, and face: Denies mucositis or sore throat Respiratory: Denies cough, dyspnea or wheezes Cardiovascular: Denies palpitation, chest discomfort Gastrointestinal: Denies nausea, heartburn or change in bowel habits Skin: Denies abnormal skin rashes Lymphatics: Denies new lymphadenopathy or easy bruising Neurological: Denies numbness, tingling or new weaknesses Behavioral/Psych: Mood is stable, no new changes  Extremities: No lower extremity edema Breast: denies any pain or lumps or nodules in either breasts All other systems were reviewed with the patient and are negative.  I have reviewed the past medical history, past surgical history, social history and family history with the patient  and they are unchanged from previous note.  ALLERGIES:  is allergic  to levaquin [levofloxacin] and codeine.  MEDICATIONS:  Current Outpatient Medications  Medication Sig Dispense Refill  . acetaminophen (TYLENOL) 500 MG tablet Take 1,000 mg by mouth every 6 (six) hours as needed for moderate pain or headache.    . albuterol (PROVENTIL HFA;VENTOLIN HFA) 108 (90 Base) MCG/ACT inhaler Inhale 2 puffs into the lungs every 6 (six) hours as needed for wheezing or shortness of breath.    Marland Kitchen apixaban (ELIQUIS) 5 MG TABS tablet Take 1 tablet (5 mg total) by mouth 2 (two) times daily. 60 tablet 0  . cholestyramine (QUESTRAN) 4 g packet Take 1 packet (4 g total) by mouth 3 (three) times daily with meals. (Patient taking differently: Take 4 g by mouth daily. ) 60 each 12  . CINNAMON PO Take 1 tablet by mouth daily.    Marland Kitchen diltiazem (CARDIZEM CD) 300 MG 24 hr capsule Take 1 capsule (300 mg total) by mouth daily. 90 capsule 3  . diphenoxylate-atropine (LOMOTIL) 2.5-0.025 MG tablet Take 1 tablet by mouth 4 (four) times daily as needed for diarrhea or loose stools. 120 tablet 3  . doxycycline (VIBRA-TABS) 100 MG tablet Take 1 tablet (100 mg total) by mouth 2 (two) times daily. 60 tablet 0  . insulin detemir (LEVEMIR) 100 UNIT/ML injection Inject 0.11 mLs (11 Units total) into the skin at bedtime. (Patient taking differently: Inject 8 Units into the skin at bedtime. )  0  . LORazepam (ATIVAN) 0.5 MG tablet Take 1 tablet (0.5 mg total) by mouth 2 (two) times daily. 60 tablet 0  . metFORMIN (GLUCOPHAGE) 500 MG tablet Take 1,000 mg by mouth 2 (two) times daily with a meal.     . niacin 500 MG tablet Take 500 mg by mouth daily.    . NYSTATIN powder Apply 1 Bottle topically daily as needed (irritation).     . Omega-3 1000 MG CAPS Take 1,000 mg by mouth 2 (two) times daily.    . ondansetron (ZOFRAN) 8 MG tablet Take 1 tablet (8 mg total) by mouth 2 (two) times daily as needed (Nausea or vomiting). 30 tablet 1  . pantoprazole (PROTONIX) 40 MG tablet Take 1 tablet (40 mg total) by mouth  daily. 30 tablet 3  . potassium chloride SA (K-DUR) 20 MEQ tablet Take 1 tablet (20 mEq total) by mouth 2 (two) times daily. 60 tablet 0  . pravastatin (PRAVACHOL) 20 MG tablet Take 20 mg by mouth daily before breakfast.   0  . traMADol (ULTRAM) 50 MG tablet Take 1 tablet (50 mg total) by mouth every 6 (six) hours as needed. 20 tablet 0   No current facility-administered medications for this visit.     PHYSICAL EXAMINATION: ECOG PERFORMANCE STATUS: 1 - Symptomatic but completely ambulatory  Vitals:   12/18/18 1039  BP: 140/65  Pulse: 94  Resp: 18  Temp: 97.7 F (36.5 C)  SpO2: 99%   Filed Weights    GENERAL: alert, no distress and comfortable SKIN: skin color, texture, turgor are normal, no rashes or significant lesions EYES: normal, Conjunctiva are pink and non-injected, sclera clear OROPHARYNX: no exudate, no erythema and lips, buccal mucosa, and tongue normal  NECK: supple, thyroid normal size, non-tender, without nodularity LYMPH: no palpable lymphadenopathy in the cervical, axillary or inguinal LUNGS: clear to auscultation and percussion with normal breathing effort HEART: regular rate & rhythm and no murmurs and no lower extremity edema ABDOMEN: abdomen soft, non-tender  and normal bowel sounds MUSCULOSKELETAL: no cyanosis of digits and no clubbing  NEURO: alert & oriented x 3 with fluent speech, no focal motor/sensory deficits EXTREMITIES: No lower extremity edema  LABORATORY DATA:  I have reviewed the data as listed CMP Latest Ref Rng & Units 12/18/2018 12/09/2018 11/01/2018  Glucose 70 - 99 mg/dL 105(H) 120(H) 97  BUN 8 - 23 mg/dL 16 14 16   Creatinine 0.44 - 1.00 mg/dL 0.77 0.81 0.85  Sodium 135 - 145 mmol/L 138 137 137  Potassium 3.5 - 5.1 mmol/L 4.3 4.1 4.5  Chloride 98 - 111 mmol/L 98 100 104  CO2 22 - 32 mmol/L 29 28 25   Calcium 8.9 - 10.3 mg/dL 8.0(L) 8.0(L) 7.4(L)  Total Protein 6.5 - 8.1 g/dL 6.3(L) 6.3(L) 6.0(L)  Total Bilirubin 0.3 - 1.2 mg/dL 0.4 0.5 0.4   Alkaline Phos 38 - 126 U/L 108 102 132(H)  AST 15 - 41 U/L 16 20 20   ALT 0 - 44 U/L 10 14 16     Lab Results  Component Value Date   WBC 10.9 (H) 12/18/2018   HGB 10.5 (L) 12/18/2018   HCT 34.5 (L) 12/18/2018   MCV 86.7 12/18/2018   PLT 228 12/18/2018   NEUTROABS 8.8 (H) 12/18/2018    ASSESSMENT & PLAN:  Malignant neoplasm of upper-outer quadrant of left breast in female, estrogen receptor positive ([place]) 07/09/2018:Screening mammogram detected spiculated mass in the left breast upper outer quadrant, 2.6 cm by MRI, axilla negative, 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 Neoadjuvant chemotherapy withTaxol weekly x 8 Herceptin Perjeta (Perjeta discontinued due to diarrhea )every 3 weeksfollowed by  Kadcylamaintenance for 1 year 2. Followed by breast conserving surgery if possible with sentinel lymph node study 12/10/2018 3. Followed by adjuvant radiation therapy if patient had lumpectomy 4.Followed by adjuvant antiestrogen therapy -------------------------------------------------------------------------------------------------------------------------------------  12/10/2018:Left lumpectomy ([facility]): IDC, 1.5cm, grade 2, HER-2 negative, ER+ 100%, PR+ 80%, Ki67 5%, clear margins, 2 lymph nodes negative for carcinoma.   Pathology counseling: I discussed the final pathology report of the patient provided  a copy of this report. I discussed the margins as well as lymph node surgeries. We also discussed the final staging along with previously performed ER/PR and HER-2/neu testing.  Treatment plan: Kadcyla every 3 weeks maintenance therapy Because she feels so fatigued, I will plan to start Kadcyla in 6 weeks from now. Adjuvant radiation therapy followed by adjuvant antiestrogen therapy  No orders of the defined types were placed in this encounter.  The patient has a good understanding of the  overall plan. she agrees with it. she will call with any problems that may develop before the next visit here.  Nicholas Lose, MD 12/18/2018  Julious Oka Dorshimer am acting as scribe for Dr. Nicholas Lose.  I have reviewed the above documentation for accuracy and completeness, and I agree with the above.

## 2018-12-17 NOTE — Progress Notes (Signed)
Location of Breast Cancer: Left Breast  Histology per Pathology Report:  07/09/18 Diagnosis Breast, left, needle core biopsy, 1:00 - INVASIVE DUCTAL CARCINOMA. - DUCTAL CARCINOMA IN SITU.  Receptor Status: ER(100%), PR (70%), Her2-neu (POS)  12/10/18 Diagnosis 1. Breast, lumpectomy, Left w/seed - INVASIVE DUCTAL CARCINOMA, 1.5 CM. - PREVIOUS BIOPSY SITE AND BIOPSY CLIP. - INVASIVE CARCINOMA FOCALLY 0.1 CM FROM THE POSTERIOR MARGIN. - SEE ONCOLOGY TABLE AND COMMENT. 2. Lymph node, sentinel, biopsy, Left - ONE BENIGN LYMPH NODE (0/1). 3. Lymph node, sentinel, biopsy, Left - ONE BENIGN LYMPH NODE (0/1).  Did patient present with symptoms or was this found on screening mammography?: It was found on a screening mammogram.   Past/Anticipated interventions by surgeon, if any: 12/10/18 Dr. Brantley Stage  Procedure: Left breast seed localized lumpectomy with left axillary sentinel lymph node mapping using methylene blue dye  Past/Anticipated interventions by medical oncology, if any:  12/18/18 Dr. Lindi Adie Treatment plan: Kadcyla every 3 weeks maintenance therapy Because she feels so fatigued, I will plan to start Kadcyla in 6 weeks from now. Adjuvant radiation therapy followed by adjuvant antiestrogen therapy  Lymphedema issues, if any:  She denies. She has good arm mobility.   Pain issues, if any:  She reports sharp occasional pains to her surgical site.   SAFETY ISSUES:  Prior radiation? No  Pacemaker/ICD? No  Possible current pregnancy? No  Is the patient on methotrexate? No  Current Complaints / other details:

## 2018-12-18 ENCOUNTER — Inpatient Hospital Stay (HOSPITAL_BASED_OUTPATIENT_CLINIC_OR_DEPARTMENT_OTHER): Payer: Medicare Other | Admitting: Hematology and Oncology

## 2018-12-18 ENCOUNTER — Inpatient Hospital Stay: Payer: Medicare Other | Attending: Hematology and Oncology

## 2018-12-18 ENCOUNTER — Other Ambulatory Visit: Payer: Self-pay

## 2018-12-18 ENCOUNTER — Other Ambulatory Visit: Payer: Self-pay | Admitting: Hematology and Oncology

## 2018-12-18 ENCOUNTER — Inpatient Hospital Stay: Payer: Medicare Other

## 2018-12-18 DIAGNOSIS — Z885 Allergy status to narcotic agent status: Secondary | ICD-10-CM | POA: Diagnosis not present

## 2018-12-18 DIAGNOSIS — Z7901 Long term (current) use of anticoagulants: Secondary | ICD-10-CM | POA: Insufficient documentation

## 2018-12-18 DIAGNOSIS — Z8249 Family history of ischemic heart disease and other diseases of the circulatory system: Secondary | ICD-10-CM | POA: Insufficient documentation

## 2018-12-18 DIAGNOSIS — C50412 Malignant neoplasm of upper-outer quadrant of left female breast: Secondary | ICD-10-CM

## 2018-12-18 DIAGNOSIS — Z881 Allergy status to other antibiotic agents status: Secondary | ICD-10-CM

## 2018-12-18 DIAGNOSIS — L03115 Cellulitis of right lower limb: Secondary | ICD-10-CM | POA: Insufficient documentation

## 2018-12-18 DIAGNOSIS — E119 Type 2 diabetes mellitus without complications: Secondary | ICD-10-CM | POA: Insufficient documentation

## 2018-12-18 DIAGNOSIS — Z5112 Encounter for antineoplastic immunotherapy: Secondary | ICD-10-CM | POA: Insufficient documentation

## 2018-12-18 DIAGNOSIS — I4891 Unspecified atrial fibrillation: Secondary | ICD-10-CM | POA: Diagnosis not present

## 2018-12-18 DIAGNOSIS — Z79899 Other long term (current) drug therapy: Secondary | ICD-10-CM | POA: Insufficient documentation

## 2018-12-18 DIAGNOSIS — Z794 Long term (current) use of insulin: Secondary | ICD-10-CM

## 2018-12-18 DIAGNOSIS — Z170 Estrogen receptor positive status [ER+]: Secondary | ICD-10-CM

## 2018-12-18 DIAGNOSIS — Z803 Family history of malignant neoplasm of breast: Secondary | ICD-10-CM | POA: Diagnosis not present

## 2018-12-18 DIAGNOSIS — Z8049 Family history of malignant neoplasm of other genital organs: Secondary | ICD-10-CM | POA: Insufficient documentation

## 2018-12-18 DIAGNOSIS — I10 Essential (primary) hypertension: Secondary | ICD-10-CM | POA: Insufficient documentation

## 2018-12-18 DIAGNOSIS — R197 Diarrhea, unspecified: Secondary | ICD-10-CM

## 2018-12-18 DIAGNOSIS — Z95828 Presence of other vascular implants and grafts: Secondary | ICD-10-CM

## 2018-12-18 LAB — CMP (CANCER CENTER ONLY)
ALT: 10 U/L (ref 0–44)
AST: 16 U/L (ref 15–41)
Albumin: 3 g/dL — ABNORMAL LOW (ref 3.5–5.0)
Alkaline Phosphatase: 108 U/L (ref 38–126)
Anion gap: 11 (ref 5–15)
BUN: 16 mg/dL (ref 8–23)
CO2: 29 mmol/L (ref 22–32)
Calcium: 8 mg/dL — ABNORMAL LOW (ref 8.9–10.3)
Chloride: 98 mmol/L (ref 98–111)
Creatinine: 0.77 mg/dL (ref 0.44–1.00)
GFR, Est AFR Am: >60 mL/min (ref >60)
GFR, Estimated: >60 mL/min (ref >60)
Glucose, Bld: 105 mg/dL — ABNORMAL HIGH (ref 70–99)
Potassium: 4.3 mmol/L (ref 3.5–5.1)
Sodium: 138 mmol/L (ref 135–145)
Total Bilirubin: 0.4 mg/dL (ref 0.3–1.2)
Total Protein: 6.3 g/dL — ABNORMAL LOW (ref 6.5–8.1)

## 2018-12-18 LAB — CBC WITH DIFFERENTIAL (CANCER CENTER ONLY)
Abs Immature Granulocytes: 0.05 10*3/uL (ref 0.00–0.07)
Basophils Absolute: 0 10*3/uL (ref 0.0–0.1)
Basophils Relative: 0 %
Eosinophils Absolute: 0.2 10*3/uL (ref 0.0–0.5)
Eosinophils Relative: 1 %
HCT: 34.5 % — ABNORMAL LOW (ref 36.0–46.0)
Hemoglobin: 10.5 g/dL — ABNORMAL LOW (ref 12.0–15.0)
Immature Granulocytes: 1 %
Lymphocytes Relative: 11 %
Lymphs Abs: 1.2 10*3/uL (ref 0.7–4.0)
MCH: 26.4 pg (ref 26.0–34.0)
MCHC: 30.4 g/dL (ref 30.0–36.0)
MCV: 86.7 fL (ref 80.0–100.0)
Monocytes Absolute: 0.7 10*3/uL (ref 0.1–1.0)
Monocytes Relative: 7 %
Neutro Abs: 8.8 10*3/uL — ABNORMAL HIGH (ref 1.7–7.7)
Neutrophils Relative %: 80 %
Platelet Count: 228 10*3/uL (ref 150–400)
RBC: 3.98 MIL/uL (ref 3.87–5.11)
RDW: 14.8 % (ref 11.5–15.5)
WBC Count: 10.9 10*3/uL — ABNORMAL HIGH (ref 4.0–10.5)
nRBC: 0 % (ref 0.0–0.2)

## 2018-12-18 MED ORDER — ACETAMINOPHEN 325 MG PO TABS
650.0000 mg | ORAL_TABLET | Freq: Once | ORAL | Status: AC
  Administered 2018-12-18: 650 mg via ORAL

## 2018-12-18 MED ORDER — SODIUM CHLORIDE 0.9% FLUSH
10.0000 mL | INTRAVENOUS | Status: DC | PRN
  Administered 2018-12-18: 10 mL
  Filled 2018-12-18: qty 10

## 2018-12-18 MED ORDER — SODIUM CHLORIDE 0.9 % IV SOLN
Freq: Once | INTRAVENOUS | Status: DC
  Filled 2018-12-18: qty 250

## 2018-12-18 MED ORDER — SODIUM CHLORIDE 0.9% FLUSH
10.0000 mL | Freq: Once | INTRAVENOUS | Status: AC
  Administered 2018-12-18: 10 mL
  Filled 2018-12-18: qty 10

## 2018-12-18 MED ORDER — ACETAMINOPHEN 325 MG PO TABS
ORAL_TABLET | ORAL | Status: AC
  Filled 2018-12-18: qty 2

## 2018-12-18 MED ORDER — TRASTUZUMAB CHEMO 150 MG IV SOLR
750.0000 mg | Freq: Once | INTRAVENOUS | Status: AC
  Administered 2018-12-18: 750 mg via INTRAVENOUS
  Filled 2018-12-18: qty 35.72

## 2018-12-18 MED ORDER — DIPHENHYDRAMINE HCL 50 MG/ML IJ SOLN
INTRAMUSCULAR | Status: AC
  Filled 2018-12-18: qty 1

## 2018-12-18 MED ORDER — HEPARIN SOD (PORK) LOCK FLUSH 100 UNIT/ML IV SOLN
500.0000 [IU] | Freq: Once | INTRAVENOUS | Status: AC | PRN
  Administered 2018-12-18: 500 [IU]
  Filled 2018-12-18: qty 5

## 2018-12-18 MED ORDER — DIPHENHYDRAMINE HCL 50 MG/ML IJ SOLN
25.0000 mg | Freq: Once | INTRAMUSCULAR | Status: AC
  Administered 2018-12-18: 12:00:00 25 mg via INTRAVENOUS

## 2018-12-18 MED ORDER — SODIUM CHLORIDE 0.9 % IV SOLN
Freq: Once | INTRAVENOUS | Status: AC
  Administered 2018-12-18: 12:00:00 via INTRAVENOUS
  Filled 2018-12-18: qty 250

## 2018-12-18 NOTE — Assessment & Plan Note (Addendum)
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 Neoadjuvant chemotherapy withTaxol weekly x 8 Herceptin Perjeta (Perjeta discontinued due to diarrhea )every 3 weeksfollowed by  Kadcylamaintenance for 1 year 2. Followed by breast conserving surgery if possible with sentinel lymph node study 12/10/2018 3. Followed by adjuvant radiation therapy if patient had lumpectomy 4.Followed by adjuvant antiestrogen therapy -------------------------------------------------------------------------------------------------------------------------------------  12/10/2018:Left lumpectomy ([facility]): IDC, 1.5cm, grade 2, HER-2 negative, ER+ 100%, PR+ 80%, Ki67 5%, clear margins, 2 lymph nodes negative for carcinoma.   Pathology counseling: I discussed the final pathology report of the patient provided  a copy of this report. I discussed the margins as well as lymph node surgeries. We also discussed the final staging along with previously performed ER/PR and HER-2/neu testing.  Treatment plan: Kadcyla every 3 weeks maintenance therapy Because she feels so fatigued, I will plan to start Kadcyla in 6 weeks from now. Adjuvant radiation therapy followed by adjuvant antiestrogen therapy

## 2018-12-18 NOTE — Patient Instructions (Signed)
[facility] Discharge Instructions for Patients Receiving Chemotherapy  Today you received the following chemotherapy agent: Herceptin.  To help prevent nausea and vomiting after your treatment, we encourage you to take your nausea medication.   If you develop nausea and vomiting that is not controlled by your nausea medication, call the clinic.   BELOW ARE SYMPTOMS THAT SHOULD BE REPORTED IMMEDIATELY:  *FEVER GREATER THAN 100.5 F  *CHILLS WITH OR WITHOUT FEVER  NAUSEA AND VOMITING THAT IS NOT CONTROLLED WITH YOUR NAUSEA MEDICATION  *UNUSUAL SHORTNESS OF BREATH  *UNUSUAL BRUISING OR BLEEDING  TENDERNESS IN MOUTH AND THROAT WITH OR WITHOUT PRESENCE OF ULCERS  *URINARY PROBLEMS  *BOWEL PROBLEMS  UNUSUAL RASH Items with * indicate a potential emergency and should be followed up as soon as possible.  Feel free to call the clinic should you have any questions or concerns. The clinic phone number is [phone].  Please show the [facility] at check-in to the Emergency Department and triage nurse.

## 2018-12-19 ENCOUNTER — Telehealth: Payer: Self-pay | Admitting: Hematology and Oncology

## 2018-12-19 ENCOUNTER — Telehealth: Payer: Self-pay | Admitting: Radiation Oncology

## 2018-12-19 DIAGNOSIS — C50412 Malignant neoplasm of upper-outer quadrant of left female breast: Secondary | ICD-10-CM | POA: Diagnosis not present

## 2018-12-19 DIAGNOSIS — E119 Type 2 diabetes mellitus without complications: Secondary | ICD-10-CM | POA: Diagnosis not present

## 2018-12-19 DIAGNOSIS — Z483 Aftercare following surgery for neoplasm: Secondary | ICD-10-CM | POA: Diagnosis not present

## 2018-12-19 DIAGNOSIS — I4819 Other persistent atrial fibrillation: Secondary | ICD-10-CM | POA: Diagnosis not present

## 2018-12-19 DIAGNOSIS — Z170 Estrogen receptor positive status [ER+]: Secondary | ICD-10-CM | POA: Diagnosis not present

## 2018-12-19 DIAGNOSIS — Z4801 Encounter for change or removal of surgical wound dressing: Secondary | ICD-10-CM | POA: Diagnosis not present

## 2018-12-19 NOTE — Telephone Encounter (Signed)
I talk with patient regarding schedule but no treatment plan

## 2018-12-19 NOTE — Telephone Encounter (Signed)
Left message for patient to verify telephone visit for pre reg

## 2018-12-20 ENCOUNTER — Telehealth: Payer: Self-pay | Admitting: Hematology and Oncology

## 2018-12-20 ENCOUNTER — Encounter: Payer: Self-pay | Admitting: Radiation Oncology

## 2018-12-20 ENCOUNTER — Other Ambulatory Visit: Payer: Self-pay

## 2018-12-20 ENCOUNTER — Ambulatory Visit
Admission: RE | Admit: 2018-12-20 | Discharge: 2018-12-20 | Disposition: A | Discharge status: Home / Self Care | Payer: Medicare Other | Source: Ambulatory Visit | Attending: Radiation Oncology | Admitting: Radiation Oncology

## 2018-12-20 DIAGNOSIS — Z170 Estrogen receptor positive status [ER+]: Secondary | ICD-10-CM | POA: Diagnosis not present

## 2018-12-20 DIAGNOSIS — Z9221 Personal history of antineoplastic chemotherapy: Secondary | ICD-10-CM | POA: Diagnosis not present

## 2018-12-20 DIAGNOSIS — C50412 Malignant neoplasm of upper-outer quadrant of left female breast: Secondary | ICD-10-CM

## 2018-12-20 DIAGNOSIS — Z9889 Other specified postprocedural states: Secondary | ICD-10-CM | POA: Diagnosis not present

## 2018-12-20 NOTE — Telephone Encounter (Signed)
Scheduled appt per 7/16 sch message - pt aware of changes appt and time

## 2018-12-20 NOTE — Progress Notes (Signed)
Radiation Oncology         [phone]) [phone] ________________________________  Name: Destiny Nash MRN: [id number]  Date: 12/20/2018  DOB: [birth year]  Follow-Up New Note by phone due to pandemic precautions, patient could not access WebEx today  Outpatient  CC: [name], MD  Nicholas Lose, MD  Diagnosis:      ICD-10-CM   1. Malignant neoplasm of upper-outer quadrant of left breast in female, estrogen receptor positive ([place])  C50.412    Z17.0      Cancer Staging Malignant neoplasm of upper-outer quadrant of left breast in female, estrogen receptor positive ([place]) Staging form: Breast, AJCC 8th Edition - Clinical stage from 07/25/2018: Stage IB (cT2, cN0, cM0, G2, ER+, PR+, HER2+) - Signed by Nicholas Lose, MD on 07/25/2018 - Pathologic stage from 12/10/2018: No Stage Recommended (ypT1c, pN0, cM0, G2, ER+, PR+, HER2-) - Signed by Gardenia Phlegm, NP on 12/18/2018   CHIEF COMPLAINT: Here to discuss management of left breast cancer  Narrative:  The patient returns today for follow-up to discuss radiation treatment options.     Since consultation date, she was being treated with systemic therapy by Dr. Lindi Adie: weekly taxol with herceptin and perjeta every 3 weeks beginning 08/02/2018.  She also underwent genetics testing on 10/04/2018, which showed negative results.   She then underwent bilateral breast MRI on 10/18/2018 revealing: interval decrease in size and enhancement of left breast mass.  It should be noted that her pre-and post chemotherapy MRIs were negative for adenopathy.  She opted to proceed with left lumpectomy with lymph node biopsy on date of 12/10/2018 with pathology report revealing: tumor size of 1.5 cm; histology of invasive ductal carcinoma; margin status to invasive disease of 0.1 cm from posterior margin; nodal status of negative (0/2); ER status: 100%; PR status 80%, Her2 status negative; Grade 2.  Symptomatically, the patient reports: Her hair is growing  back and she is healing well from surgery         ALLERGIES:  is allergic to levaquin [levofloxacin] and codeine.  Meds: Current Outpatient Medications  Medication Sig Dispense Refill   acetaminophen (TYLENOL) 500 MG tablet Take 1,000 mg by mouth every 6 (six) hours as needed for moderate pain or headache.     albuterol (PROVENTIL HFA;VENTOLIN HFA) 108 (90 Base) MCG/ACT inhaler Inhale 2 puffs into the lungs every 6 (six) hours as needed for wheezing or shortness of breath.     apixaban (ELIQUIS) 5 MG TABS tablet Take 1 tablet (5 mg total) by mouth 2 (two) times daily. 60 tablet 0   cholestyramine (QUESTRAN) 4 g packet Take 1 packet (4 g total) by mouth 3 (three) times daily with meals. (Patient taking differently: Take 4 g by mouth daily. ) 60 each 12   CINNAMON PO Take 1 tablet by mouth daily.     diltiazem (CARDIZEM CD) 300 MG 24 hr capsule Take 1 capsule (300 mg total) by mouth daily. 90 capsule 3   diphenoxylate-atropine (LOMOTIL) 2.5-0.025 MG tablet Take 1 tablet by mouth 4 (four) times daily as needed for diarrhea or loose stools. 120 tablet 3   doxycycline (VIBRA-TABS) 100 MG tablet Take 1 tablet (100 mg total) by mouth 2 (two) times daily. 60 tablet 0   insulin detemir (LEVEMIR) 100 UNIT/ML injection Inject 0.11 mLs (11 Units total) into the skin at bedtime. (Patient taking differently: Inject 8 Units into the skin at bedtime. )  0   LORazepam (ATIVAN) 0.5 MG tablet Take 1 tablet (0.5  mg total) by mouth 2 (two) times daily. 60 tablet 0   metFORMIN (GLUCOPHAGE) 500 MG tablet Take 1,000 mg by mouth 2 (two) times daily with a meal.      niacin 500 MG tablet Take 500 mg by mouth daily.     NYSTATIN powder Apply 1 Bottle topically daily as needed (irritation).      Omega-3 1000 MG CAPS Take 1,000 mg by mouth 2 (two) times daily.     ondansetron (ZOFRAN) 8 MG tablet Take 1 tablet (8 mg total) by mouth 2 (two) times daily as needed (Nausea or vomiting). 30 tablet 1    pantoprazole (PROTONIX) 40 MG tablet Take 1 tablet (40 mg total) by mouth daily. 30 tablet 3   potassium chloride SA (K-DUR) 20 MEQ tablet Take 1 tablet (20 mEq total) by mouth 2 (two) times daily. 60 tablet 0   pravastatin (PRAVACHOL) 20 MG tablet Take 20 mg by mouth daily before breakfast.   0   traMADol (ULTRAM) 50 MG tablet Take 1 tablet (50 mg total) by mouth every 6 (six) hours as needed. 20 tablet 0   No current facility-administered medications for this encounter.     Physical Findings:  vitals were not taken for this visit. .     General: Alert and oriented, in no acute distress  Lab Findings: Lab Results  Component Value Date   WBC 10.9 (H) 12/18/2018   HGB 10.5 (L) 12/18/2018   HCT 34.5 (L) 12/18/2018   MCV 86.7 12/18/2018   PLT 228 12/18/2018    Radiographic Findings: Nm Sentinel Node Inj-no Rpt (breast)  Result Date: 12/10/2018 Sulfur colloid was injected by the nuclear medicine technologist for melanoma sentinel node.   Mm Breast Surgical Specimen  Result Date: 12/10/2018 CLINICAL DATA:  73 year old patient had radioactive seed localization of the left breast on December 09, 2018 prior to lumpectomy. EXAM: SPECIMEN RADIOGRAPH OF THE LEFT BREAST COMPARISON:  Previous exam(s). FINDINGS: Status post excision of the left breast. The radioactive seed and ribbon shaped biopsy marker clip are present, completely intact, and were marked for pathology. The mass is visualized within the specimen. IMPRESSION: Specimen radiograph of the left breast. Electronically Signed   By: Curlene Dolphin M.D.   On: 12/10/2018 09:38   Mm Lt Radioactive Seed Loc Mammo Guide  Result Date: 12/09/2018 CLINICAL DATA:  73 year old female presenting for radioactive seed localization of the left breast prior to lumpectomy. EXAM: MAMMOGRAPHIC GUIDED RADIOACTIVE SEED LOCALIZATION OF THE LEFT BREAST COMPARISON:  Previous exam(s). FINDINGS: Patient presents for radioactive seed localization prior to left breast  lumpectomy. I met with the patient and we discussed the procedure of seed localization including benefits and alternatives. We discussed the high likelihood of a successful procedure. We discussed the risks of the procedure including infection, bleeding, tissue injury and further surgery. We discussed the low dose of radioactivity involved in the procedure. Informed, written consent was given. The usual time-out protocol was performed immediately prior to the procedure. Using mammographic guidance, sterile technique, 1% lidocaine and an I-125 radioactive seed, the ribbon shaped biopsy marking clip with in the left breast mass in the upper-outer quadrant was localized using a lateral approach. The follow-up mammogram images confirm the seed in the expected location and were marked for Dr. Brantley Stage. Follow-up survey of the patient confirms presence of the radioactive seed. Order number of I-125 seed:  [id number]. Total activity:  6.314 millicuries reference Date: 11/19/2018 The patient tolerated the procedure well and was [facility]. She was given instructions regarding seed removal. IMPRESSION: Radioactive seed localization left breast. No apparent complications. Electronically Signed   By: Ammie Ferrier M.D.   On: 12/09/2018 15:34   Ct Angio Abdomen Pelvis  W &/or Wo Contrast  Result Date: 11/29/2018 CLINICAL DATA:  Chronic venous insufficiency. Recently diagnosed left-sided breast cancer with ongoing chemotherapy. EXAM: CT ANGIOGRAPHY ABDOMEN AND PELVIS WITH CONTRAST AND WITHOUT CONTRAST TECHNIQUE: Multidetector CT imaging of the abdomen and pelvis was performed using the standard protocol during bolus administration of intravenous contrast. Multiplanar reconstructed images and MIPs were obtained and reviewed to evaluate the vascular anatomy. CONTRAST:  126m ISOVUE-370 IOPAMIDOL (ISOVUE-370) INJECTION 76% COMPARISON:  None. FINDINGS: VASCULAR Aorta: Minimal amount of atherosclerotic  plaque within normal caliber abdominal aorta, not resulting in hemodynamically significant stenosis. No abdominal aortic dissection or periaortic stranding. Celiac: There is a very minimal amount of eccentric calcified plaque involving the cranial aspect of the origin the celiac artery, not resulting in hemodynamically significant stenosis. SMA: There is a minimal amount eccentric calcified atherosclerotic plaque involving the origin the SMA, not resulting in hemodynamically significant stenosis. There is a suspected either accessory replaced hepatic artery arising from the proximal SMA whose course is difficult to trace secondary to lack of arterial phase timing. The distal tributaries the SMA appear widely patent without discrete intraluminal filling defect to suggest distal embolism. Renals: Solitary bilaterally. There is a moderate amount of eccentric mixed calcified and noncalcified atherosclerotic plaque involving the origin the bilateral renal arteries which approaches 50% luminal narrowing bilaterally, right greater than left. This finding is without associated delayed renal enhancement or asymmetric renal atrophy. IMA: Remains patent. Inflow: There is a very minimal amount of atherosclerotic plaque within the pelvic arterial system, not resulting in hemodynamically significant stenosis. The bilateral common, external and internal iliac arteries are of normal caliber and widely patent without hemodynamically significant stenosis. Proximal Outflow: The bilateral common femoral and imaged portions of the bilateral deep and superficial femoral arteries appear widely patent without hemodynamically significant narrowing. Veins: The imaged proximal thigh, pelvic venous system and IVC appear widely patent without evidence of thrombosis or perivascular stranding. Additionally, there is no definitive mass effect of the right common iliac artery upon the left common iliac vein to suggest the presence of a May Thurner.  Review of the MIP images confirms the above findings. _________________________________________________________ NON-VASCULAR Lower chest: Limited visualization of the lower thorax demonstrates small to moderate-sized bilateral pleural effusions with associated adjacent subsegmental atelectasis, right greater than left. Cardiomegaly. Coronary artery calcifications. No pericardial effusion. Hepatobiliary: Normal hepatic contour.  No discrete hepatic lesions. No radiopaque gallstones. Questioned gallbladder wall thickening is favored to be artifactual due to patient motion. No definitive pericholecystic fluid. No intra extrahepatic biliary duct dilatation. No ascites. Pancreas: Normal appearance of the pancreas. Spleen: Normal appearance of the spleen. Punctate splenules are noted about the tip of the spleen. Adrenals/Urinary Tract: There is symmetric enhancement and excretion of the bilateral kidneys. Note is made of an approximately 1.3 cm hypoattenuating cyst involving the posterior aspect of the right kidney. Subcentimeter left-sided renal lesion is too small to accurately characterize. No definite renal stones on this postcontrast examination. No urinary obstruction or perinephric stranding. Normal appearance the bilateral adrenal glands. Normal appearance of the urinary bladder. Stomach/Bowel: No evidence of enteric obstruction. No pneumoperitoneum, pneumatosis or portal venous gas. Lymphatic: Borderline enlarged porta hepatis, right pelvic bilateral inguinal lymph nodes are presumably reactive etiology with index porta hepatis lymph node measuring  1.6 cm in greatest short axis diameter (image 29, series 3), index right external iliac chain lymph node measuring 1.2 cm (image 90), index right common femoral chain lymph node measuring 1.1 cm (image 112) and index left inguinal lymph node measuring 1.0 cm (image 119), presumably reactive in etiology secondary to diffuse body wall anasarca. No definitive bulky  retroperitoneal, mesenteric, pelvic or inguinal lymphadenopathy. Reproductive: An intrauterine device is seen within a expectedly atrophic uterus. No discrete adnexal lesion. There is a small amount of free fluid in the pelvic cul-de-sac which does not demonstrate peripheral wall enhancement. Other: Diffuse body wall anasarca. Musculoskeletal: Age-indeterminate mild (approximately 25%) compression fracture involving the anterior inferior aspect of the L3 vertebral body (sagittal image 60, series 9; coronal image 63, series 10). Moderate severe multilevel lumbar spine DDD. Grade 1 anterolisthesis of L4 upon L5 without associated pars defects. IMPRESSION: Vascular Impression: 1. Widely IVC, proximal thigh and pelvic venous system without evidence of thrombosis or perivascular stranding. No CT evidence of May Thurner syndrome. 2. Cardiomegaly with small to moderate-sized bilateral pleural effusions, right greater than left, and diffuse body wall anasarca - Constellation of findings are worrisome for congestive heart failure which could contribute to provided history of lower extremity edema. Further evaluation could be performed with cardiac echo as clinically indicated. 3. Coronary calcifications.  Aortic Atherosclerosis (ICD10-I70.0). 4. Potential hemodynamically significant narrowings involving the origin the bilateral renal arteries, right greater than left, suboptimally evaluated secondary to lack of arterial phase imaging and without associated delayed renal enhancement or asymmetric renal atrophy. Nonvascular Impression: 1. Age-indeterminate mild (approximately 25%) compression fracture involving the anterior inferior aspect of the L3 vertebral body. Correlation for point tenderness at this location is advised. Further evaluation could be performed with lumbar spine MRI as indicated. 2. Presumably reactive porta hepatis, pelvic and inguinal lymph nodes in the setting of diffuse body wall anasarca. No definitive  bulky retroperitoneal, mesenteric, pelvic or inguinal lymphadenopathy. These results will be called to the ordering clinician or representative by the Radiologist Assistant, and communication documented in the PACS or zVision Dashboard. Electronically Signed   By: Sandi Mariscal M.D.   On: 11/29/2018 14:38    Impression/Plan: Left Breast IDC  We discussed adjuvant radiotherapy today.  I recommend 4 weeks directed at the left breast in order to reduce risk of local regional recurrence by two thirds.  The risks, benefits and side effects of this treatment were discussed in detail.  She understands that radiotherapy is associated with skin irritation and fatigue in the acute setting. Late effects can include cosmetic changes and rare injury to internal organs.  She is enthusiastic about proceeding with treatment.   I will schedule a CT simulation for the second week of August.  She is pleased with this plan.  This encounter was provided by telemedicine platform telephone due to pandemic precautions; the patient could not access WebEx today. The patient has given verbal consent for this type of encounter and has been advised to only accept a meeting of this type in a secure network environment. The time spent during this encounter was over 10 minutes. The attendants for this meeting include Eppie Gibson  and Lorie Phenix.  During the encounter, Eppie Gibson was located at [facility] Rad[facility] On[facility].  Lorie Phenix was located at home.   _____________________________________   Eppie Gibson, MD   This document serves as a record of services personally performed by Eppie Gibson, MD. It was created on  her behalf by Wilburn Mylar, a trained medical scribe. The creation of this record is based on the scribe's personal observations and the provider's statements to them. This document has been checked and approved by the attending provider.

## 2018-12-24 ENCOUNTER — Telehealth: Payer: Self-pay | Admitting: *Deleted

## 2018-12-24 DIAGNOSIS — I4819 Other persistent atrial fibrillation: Secondary | ICD-10-CM | POA: Diagnosis not present

## 2018-12-24 DIAGNOSIS — Z4801 Encounter for change or removal of surgical wound dressing: Secondary | ICD-10-CM | POA: Diagnosis not present

## 2018-12-24 DIAGNOSIS — E119 Type 2 diabetes mellitus without complications: Secondary | ICD-10-CM | POA: Diagnosis not present

## 2018-12-24 DIAGNOSIS — Z483 Aftercare following surgery for neoplasm: Secondary | ICD-10-CM | POA: Diagnosis not present

## 2018-12-24 DIAGNOSIS — C50412 Malignant neoplasm of upper-outer quadrant of left female breast: Secondary | ICD-10-CM | POA: Diagnosis not present

## 2018-12-24 DIAGNOSIS — Z170 Estrogen receptor positive status [ER+]: Secondary | ICD-10-CM | POA: Diagnosis not present

## 2018-12-24 NOTE — Telephone Encounter (Signed)
Received call from Kathlee Nations, [place] with home health stating that the cellulitis on pt right lower leg is not improving.  States it is now red and warm to the touch and denies fever.  Sandi Mealy, PA with symptom management notified and instructed to have pt come to [facility] tomorrow to be evaluated.  Pt notified and stated she is able to come in tomorrow at 1pm.  High priority message sent to scheduling.

## 2018-12-25 ENCOUNTER — Encounter: Payer: Self-pay | Admitting: Medical

## 2018-12-25 ENCOUNTER — Inpatient Hospital Stay (HOSPITAL_BASED_OUTPATIENT_CLINIC_OR_DEPARTMENT_OTHER): Payer: Medicare Other | Admitting: Medical

## 2018-12-25 ENCOUNTER — Other Ambulatory Visit: Payer: Self-pay

## 2018-12-25 ENCOUNTER — Inpatient Hospital Stay: Payer: Medicare Other

## 2018-12-25 VITALS — BP 139/71 | HR 93 | Temp 98.8°F | Resp 18

## 2018-12-25 DIAGNOSIS — I10 Essential (primary) hypertension: Secondary | ICD-10-CM | POA: Diagnosis not present

## 2018-12-25 DIAGNOSIS — E119 Type 2 diabetes mellitus without complications: Secondary | ICD-10-CM | POA: Diagnosis not present

## 2018-12-25 DIAGNOSIS — C50412 Malignant neoplasm of upper-outer quadrant of left female breast: Secondary | ICD-10-CM | POA: Diagnosis not present

## 2018-12-25 DIAGNOSIS — R197 Diarrhea, unspecified: Secondary | ICD-10-CM | POA: Diagnosis not present

## 2018-12-25 DIAGNOSIS — Z5112 Encounter for antineoplastic immunotherapy: Secondary | ICD-10-CM | POA: Diagnosis not present

## 2018-12-25 DIAGNOSIS — Z170 Estrogen receptor positive status [ER+]: Secondary | ICD-10-CM

## 2018-12-25 DIAGNOSIS — L03115 Cellulitis of right lower limb: Secondary | ICD-10-CM | POA: Diagnosis not present

## 2018-12-25 DIAGNOSIS — I4891 Unspecified atrial fibrillation: Secondary | ICD-10-CM

## 2018-12-25 DIAGNOSIS — Z794 Long term (current) use of insulin: Secondary | ICD-10-CM

## 2018-12-25 DIAGNOSIS — L03119 Cellulitis of unspecified part of limb: Secondary | ICD-10-CM

## 2018-12-25 DIAGNOSIS — Z885 Allergy status to narcotic agent status: Secondary | ICD-10-CM | POA: Diagnosis not present

## 2018-12-25 DIAGNOSIS — Z7901 Long term (current) use of anticoagulants: Secondary | ICD-10-CM | POA: Diagnosis not present

## 2018-12-25 DIAGNOSIS — Z95828 Presence of other vascular implants and grafts: Secondary | ICD-10-CM

## 2018-12-25 DIAGNOSIS — Z8049 Family history of malignant neoplasm of other genital organs: Secondary | ICD-10-CM

## 2018-12-25 DIAGNOSIS — Z79899 Other long term (current) drug therapy: Secondary | ICD-10-CM | POA: Diagnosis not present

## 2018-12-25 DIAGNOSIS — Z803 Family history of malignant neoplasm of breast: Secondary | ICD-10-CM

## 2018-12-25 DIAGNOSIS — Z8249 Family history of ischemic heart disease and other diseases of the circulatory system: Secondary | ICD-10-CM

## 2018-12-25 DIAGNOSIS — Z881 Allergy status to other antibiotic agents status: Secondary | ICD-10-CM

## 2018-12-25 MED ORDER — VANCOMYCIN HCL 1000 MG IV SOLR
1000.0000 mg | Freq: Once | INTRAVENOUS | Status: AC
  Administered 2018-12-25: 14:00:00 1000 mg via INTRAVENOUS
  Filled 2018-12-25: qty 1000

## 2018-12-25 MED ORDER — SULFAMETHOXAZOLE-TRIMETHOPRIM 800-160 MG PO TABS: 1.0000 | ORAL_TABLET | Freq: Two times a day (BID) | ORAL | 0 refills | Status: DC

## 2018-12-25 MED ORDER — SODIUM CHLORIDE 0.9% FLUSH
10.0000 mL | Freq: Once | INTRAVENOUS | Status: AC
  Administered 2018-12-25: 10 mL via INTRAVENOUS
  Filled 2018-12-25: qty 10

## 2018-12-25 MED ORDER — HEPARIN SOD (PORK) LOCK FLUSH 100 UNIT/ML IV SOLN
500.0000 [IU] | Freq: Once | INTRAVENOUS | Status: AC
  Administered 2018-12-25: 500 [IU] via INTRAVENOUS
  Filled 2018-12-25: qty 5

## 2018-12-25 NOTE — Progress Notes (Signed)
[facility] Progress Note   Destiny Nash [id number] 1946/02/23 73 yDestinyo.  Destiny Nash is managed by Dr. Nicholas Lose  Actively treated with chemotherapy/immunotherapy/hormonal therapy: Yes  Current therapy: Herceptin  Last treated: 12/18/2018 (cycle #7)  Next scheduled appointment with provider: 01/31/2019  Assessment: Plan:    Malignant neoplasm of upper-outer quadrant of left breast in female, estrogen receptor positive: The patient continues to be followed by Dr. Nicholas Lose. She continues to receive Herceptin and is status post cycle #7 which was dosed on 12/18/2018. She is scheduled to be seen next on 01/31/2019.  Cellulitis of the right lower extremity: Destiny Nash,. Nash has been on doxycycline twice daily since 12/04/2018. She will discontinue doxycycline and will be dosed with Vancomycin 1 gram IV x 1 today and will be given Bactrim DS PO BID x 30 days. She has been instructed to maintain adequate hydration.  Please see After Visit Summary for patient specific instructions.  Future Appointments  Date Time Provider [place]  01/08/2019 11:00 AM Angelia Mould, MD [facility]  01/13/2019  3:00 PM Eppie Gibson, MD [facility] None  01/20/2019  4:15 PM [facility] LINAC 3 [facility] None  01/21/2019  4:15 PM [facility] LINAC 3 [facility] None  01/22/2019  4:15 PM [facility] LINAC 3 [facility] None  01/23/2019  4:15 PM [facility] LINAC 3 [facility] None  01/24/2019  4:15 PM [facility] LINAC 3 [facility] None  01/27/2019  4:15 PM [facility] LINAC 3 [facility] None  01/28/2019  4:15 PM [facility] LINAC 3 [facility] None  01/29/2019  4:15 PM [facility] LINAC 3 [facility] None  01/30/2019  4:15 PM [facility] LINAC 3 [facility] None  01/31/2019 10:45 AM [facility] ONLY [facility] None  01/31/2019 11:00 AM [facility] [place] FLUSH [facility] None  01/31/2019 11:30 AM Causey, Charlestine Massed, NP [facility] None  01/31/2019 12:30 PM  [facility] INFUSION [facility] None  01/31/2019  4:15 PM [facility] LINAC 3 [facility] None  02/03/2019  4:15 PM [facility] LINAC 3 [facility] None  02/04/2019  4:15 PM [facility] LINAC 3 [facility] None  02/05/2019  4:00 PM [facility] LINAC 3 [facility] None  02/06/2019  4:15 PM [facility] LINAC 3 [facility] None  02/07/2019  4:15 PM [facility] LINAC 3 [facility] None  02/11/2019  4:15 PM [facility] LINAC 3 [facility] None  02/12/2019  4:15 PM [facility] LINAC 3 [facility] None  02/13/2019  4:15 PM [facility] LINAC 3 [facility] None  02/14/2019  4:15 PM [facility] LINAC 3 [facility] None  02/17/2019  4:15 PM [facility] LINAC 3 [facility] None  02/21/2019  8:30 AM [facility] LAB 5 [facility] None  02/21/2019  8:45 AM [facility] [place] FLUSH [facility] None  02/21/2019  9:15 AM Nicholas Lose, MD [facility] None  02/21/2019 10:30 AM [facility] INFUSION [facility] None    No orders of the defined types were placed in this encounter.      Subjective:   Patient ID:  Destiny Nash is a 55 yDestinyo. (DOB [birth year]) female.  Chief Complaint:  Chief Complaint  Patient presents with   Recurrent Skin Infections    HPI Destiny Nash  Is a 73 year old female with an ER positive malignant neoplasm of the upper-outer quadrant of left breast. She is managed by Dr. Nicholas Lose. She continues to receive Herceptin and is status post cycle #7 which was dosed on 12/18/2018.  She has a history of cellulitis of the right lower extremity. She has been treated with doxycycline twice daily since 12/04/2018. She called Dr. Geralyn Flash nurse yesterday stating that the cellulitis on  pt right lower leg is not improving.  She reported that her leg was red and warm to the touch. She denies fever, chills, pain, discharge, drainage, or recent injuries.  She was seen by vascular surgery last week and will be seen again in 2 weeks.  Medications: I have reviewed the patient's current medications.  Allergies:    Allergies  Allergen Reactions   Levaquin [Levofloxacin] Other (See Comments)    Joint "seize" and constipation   Codeine Other (See Comments)    Severe headache    Past Medical History:  Diagnosis Date   Anemia    in the past    Anxiety    Atrial fibrillation ([place])    a. diagnosed in 02/2018.   Bronchitis    easily  develops bronchitis when she has a cold   Cancer ([place])    L breast - [place], Feb. 4th    Cellulitis    Chronic kidney disease    Diabetes mellitus without complication ([place])    type 2   Dyspnea    Dysrhythmia    afib, treating with Eliquis   Family history of breast cancer    Family history of uterine cancer    Heart murmur    Hyperlipidemia    Hypertension     Past Surgical History:  Procedure Laterality Date   BREAST LUMPECTOMY WITH RADIOACTIVE SEED AND SENTINEL LYMPH NODE BIOPSY Left 12/10/2018   Procedure: LEFT BREAST LUMPECTOMY WITH RADIOACTIVE SEED AND LEFT SENTINEL LYMPH NODE MAPPING;  Surgeon: Erroll Luna, MD;  Location: [place];  Service: General;  Laterality: Left;   DILATION AND CURETTAGE OF UTERUS     x2 on same day   PORTACATH PLACEMENT N/A 08/01/2018   Procedure: INSERTION PORT-A-CATH WITH ULTRASOUND;  Surgeon: Erroll Luna, MD;  Location: [place];  Service: General;  Laterality: N/A;   TONSILLECTOMY AND ADENOIDECTOMY      Family History  Problem Relation Age of Onset   CAD Brother    Hypertension Mother    Hyperlipidemia Mother    Hypertension Brother    Uterine cancer Maternal Grandmother    Breast cancer Cousin        dx in her 88s; mat first cousin   Breast cancer Cousin        dx in her 16s-60s; mat first cousin    Social History   Socioeconomic History   Marital status: Widowed    Spouse name: Not on file   Number of children: Not on file   Years of education: Not on file   Highest education level: Not on file  Occupational History   Not on file  Social Needs   Financial resource  strain: Not on file   Food insecurity    Worry: Not on file    Inability: Not on file   Transportation needs    Medical: No    Non-medical: No  Tobacco Use   Smoking status: Never Smoker   Smokeless tobacco: Never Used  Substance and Sexual Activity   Alcohol use: Not Currently   Drug use: Never   Sexual activity: Not Currently  Lifestyle   Physical activity    Days per week: Not on file    Minutes per session: Not on file   Stress: Not on file  Relationships   Social connections    Talks on phone: Not on file    Gets together: Not on file    Attends religious service: Not on file    Active  member of club or organization: Not on file    Attends meetings of clubs or organizations: Not on file    Relationship status: Not on file   Intimate partner violence    Fear of current or ex partner: No    Emotionally abused: No    Physically abused: No    Forced sexual activity: No  Other Topics Concern   Not on file  Social History Narrative   Her husband passed away on 07-08-2018.   Religion: Christian.    Past Medical History, Surgical history, Social history, and Family history were reviewed and updated as appropriate.   Please see review of systems for further details on the patient's review from today.   Review of Systems:  Review of Systems  Constitutional: Negative for chills, diaphoresis and fever.  HENT: Negative for facial swelling and trouble swallowing.   Respiratory: Negative for cough, chest tightness and shortness of breath.   Cardiovascular: Negative for chest pain.  Skin: Positive for color change and rash.    Objective:   Physical Exam:  BP 139/71 (BP Location: Right Wrist, Patient Position: Sitting)    Pulse 93    Temp 98Destiny8 F (37Destiny1 C) (Oral)    Resp 18    SpO2 100%  ECOG: 1  Physical Exam Constitutional:      General: She is not in acute distress.    Appearance: She is not diaphoretic.  HENT:     Head: Normocephalic and atraumatic.   Cardiovascular:     Rate and Rhythm: Normal rate and regular rhythm.     Heart sounds: Normal heart sounds. No murmur. No friction rub. No gallop.   Pulmonary:     Effort: Pulmonary effort is normal. No respiratory distress.     Breath sounds: Normal breath sounds. No wheezing or rales.  Musculoskeletal:        General: No signs of injury.     Right lower leg: Edema present.     Left lower leg: Edema present.  Skin:    General: Skin is warm and dry.     Findings: Erythema and rash present.     Comments: Bilateral lower extremity edema is noted with erythema and increased warmth over each lower extremity.  There is scaling stasis dermatitis present.  No discharge is appreciated.  Neurological:     Mental Status: She is alert.     Gait: Gait abnormal (The patient is ambulating with a wheelchair.).  Psychiatric:        Mood and Affect: Mood normal.        Behavior: Behavior normal.        Thought Content: Thought content normal.        Judgment: Judgment normal.       Lab Review:     Component Value Date/Time   NA 138 12/18/2018 1030   K 4Destiny3 12/18/2018 1030   CL 98 12/18/2018 1030   CO2 29 12/18/2018 1030   GLUCOSE 105 (H) 12/18/2018 1030   BUN 16 12/18/2018 1030   CREATININE 0Destiny77 12/18/2018 1030   CALCIUM 8Destiny0 (L) 12/18/2018 1030   PROT 6Destiny3 (L) 12/18/2018 1030   ALBUMIN 3Destiny0 (L) 12/18/2018 1030   AST 16 12/18/2018 1030   ALT 10 12/18/2018 1030   ALKPHOS 108 12/18/2018 1030   BILITOT 0Destiny4 12/18/2018 1030   GFRNONAA >60 12/18/2018 1030   GFRAA >60 12/18/2018 1030       Component Value Date/Time   WBC 10Destiny9 (H) 12/18/2018 1030  WBC 10Destiny6 (H) 12/09/2018 1430   RBC 3Destiny98 12/18/2018 1030   HGB 10Destiny5 (L) 12/18/2018 1030   HCT 34Destiny5 (L) 12/18/2018 1030   PLT 228 12/18/2018 1030   MCV 86Destiny7 12/18/2018 1030   MCH 26Destiny4 12/18/2018 1030   MCHC 30Destiny4 12/18/2018 1030   RDW 14Destiny8 12/18/2018 1030   LYMPHSABS 1Destiny2 12/18/2018 1030   MONOABS 0Destiny7 12/18/2018 1030   EOSABS 0Destiny2 12/18/2018  1030   BASOSABS 0Destiny0 12/18/2018 1030   -------------------------------  Imaging from last 24 hours (if applicable):  Radiology interpretation: Nm Sentinel Node Inj-no Rpt (breast)  Result Date: 12/10/2018 Sulfur colloid was injected by the nuclear medicine technologist for melanoma sentinel node.   Mm Breast Surgical Specimen  Result Date: 12/10/2018 CLINICAL DATA:  73 year old patient had radioactive seed localization of the left breast on December 09, 2018 prior to lumpectomy. EXAM: SPECIMEN RADIOGRAPH OF THE LEFT BREAST COMPARISON:  Previous exam(s). FINDINGS: Status post excision of the left breast. The radioactive seed and ribbon shaped biopsy marker clip are present, completely intact, and were marked for pathology. The mass is visualized within the specimen. IMPRESSION: Specimen radiograph of the left breast. Electronically Signed   By: Curlene Dolphin MDestinyD.   On: 12/10/2018 09:38   Mm Lt Radioactive Seed Loc Mammo Guide  Result Date: 12/09/2018 CLINICAL DATA:  73 year old female presenting for radioactive seed localization of the left breast prior to lumpectomy. EXAM: MAMMOGRAPHIC GUIDED RADIOACTIVE SEED LOCALIZATION OF THE LEFT BREAST COMPARISON:  Previous exam(s). FINDINGS: Patient presents for radioactive seed localization prior to left breast lumpectomy. I met with the patient and we discussed the procedure of seed localization including benefits and alternatives. We discussed the high likelihood of a successful procedure. We discussed the risks of the procedure including infection, bleeding, tissue injury and further surgery. We discussed the low dose of radioactivity involved in the procedure. Informed, written consent was given. The usual time-out protocol was performed immediately prior to the procedure. Using mammographic guidance, sterile technique, 1% lidocaine and an I-125 radioactive seed, the ribbon shaped biopsy marking clip with in the left breast mass in the upper-outer quadrant was  localized using a lateral approach. The follow-up mammogram images confirm the seed in the expected location and were marked for Dr. Brantley Stage. Follow-up survey of the patient confirms presence of the radioactive seed. Order number of I-125 seed:  [id number]. Total activity:  4Destiny098 millicuries reference Date: 11/19/2018 The patient tolerated the procedure well and was released from the [facility]. She was given instructions regarding seed removal. IMPRESSION: Radioactive seed localization left breast. No apparent complications. Electronically Signed   By: Ammie Ferrier MDestinyD.   On: 12/09/2018 15:34   Ct Angio Abdomen Pelvis  W &/or Wo Contrast  Result Date: 11/29/2018 CLINICAL DATA:  Chronic venous insufficiency. Recently diagnosed left-sided breast cancer with ongoing chemotherapy. EXAM: CT ANGIOGRAPHY ABDOMEN AND PELVIS WITH CONTRAST AND WITHOUT CONTRAST TECHNIQUE: Multidetector CT imaging of the abdomen and pelvis was performed using the standard protocol during bolus administration of intravenous contrast. Multiplanar reconstructed images and MIPs were obtained and reviewed to evaluate the vascular anatomy. CONTRAST:  168m ISOVUE-370 IOPAMIDOL (ISOVUE-370) INJECTION 76% COMPARISON:  None. FINDINGS: VASCULAR Aorta: Minimal amount of atherosclerotic plaque within normal caliber abdominal aorta, not resulting in hemodynamically significant stenosis. No abdominal aortic dissection or periaortic stranding. Celiac: There is a very minimal amount of eccentric calcified plaque involving the cranial aspect of the origin the celiac artery, not resulting in hemodynamically significant stenosis. SMA: There is a  minimal amount eccentric calcified atherosclerotic plaque involving the origin the SMA, not resulting in hemodynamically significant stenosis. There is a suspected either accessory replaced hepatic artery arising from the proximal SMA whose course is difficult to trace secondary to lack of arterial phase  timing. The distal tributaries the SMA appear widely patent without discrete intraluminal filling defect to suggest distal embolism. Renals: Solitary bilaterally. There is a moderate amount of eccentric mixed calcified and noncalcified atherosclerotic plaque involving the origin the bilateral renal arteries which approaches 50% luminal narrowing bilaterally, right greater than left. This finding is without associated delayed renal enhancement or asymmetric renal atrophy. IMA: Remains patent. Inflow: There is a very minimal amount of atherosclerotic plaque within the pelvic arterial system, not resulting in hemodynamically significant stenosis. The bilateral common, external and internal iliac arteries are of normal caliber and widely patent without hemodynamically significant stenosis. Proximal Outflow: The bilateral common femoral and imaged portions of the bilateral deep and superficial femoral arteries appear widely patent without hemodynamically significant narrowing. Veins: The imaged proximal thigh, pelvic venous system and IVC appear widely patent without evidence of thrombosis or perivascular stranding. Additionally, there is no definitive mass effect of the right common iliac artery upon the left common iliac vein to suggest the presence of a May Thurner. Review of the MIP images confirms the above findings. _________________________________________________________ NON-VASCULAR Lower chest: Limited visualization of the lower thorax demonstrates small to moderate-sized bilateral pleural effusions with associated adjacent subsegmental atelectasis, right greater than left. Cardiomegaly. Coronary artery calcifications. No pericardial effusion. Hepatobiliary: Normal hepatic contour.  No discrete hepatic lesions. No radiopaque gallstones. Questioned gallbladder wall thickening is favored to be artifactual due to patient motion. No definitive pericholecystic fluid. No intra extrahepatic biliary duct dilatation. No  ascites. Pancreas: Normal appearance of the pancreas. Spleen: Normal appearance of the spleen. Punctate splenules are noted about the tip of the spleen. Adrenals/Urinary Tract: There is symmetric enhancement and excretion of the bilateral kidneys. Note is made of an approximately 1Destiny3 cm hypoattenuating cyst involving the posterior aspect of the right kidney. Subcentimeter left-sided renal lesion is too small to accurately characterize. No definite renal stones on this postcontrast examination. No urinary obstruction or perinephric stranding. Normal appearance the bilateral adrenal glands. Normal appearance of the urinary bladder. Stomach/Bowel: No evidence of enteric obstruction. No pneumoperitoneum, pneumatosis or portal venous gas. Lymphatic: Borderline enlarged porta hepatis, right pelvic bilateral inguinal lymph nodes are presumably reactive etiology with index porta hepatis lymph node measuring 1Destiny6 cm in greatest short axis diameter (image 29, series 3), index right external iliac chain lymph node measuring 1Destiny2 cm (image 90), index right common femoral chain lymph node measuring 1Destiny1 cm (image 112) and index left inguinal lymph node measuring 1Destiny0 cm (image 119), presumably reactive in etiology secondary to diffuse body wall anasarca. No definitive bulky retroperitoneal, mesenteric, pelvic or inguinal lymphadenopathy. Reproductive: An intrauterine device is seen within a expectedly atrophic uterus. No discrete adnexal lesion. There is a small amount of free fluid in the pelvic cul-de-sac which does not demonstrate peripheral wall enhancement. Other: Diffuse body wall anasarca. Musculoskeletal: Age-indeterminate mild (approximately 25%) compression fracture involving the anterior inferior aspect of the L3 vertebral body (sagittal image 60, series 9; coronal image 63, series 10). Moderate severe multilevel lumbar spine DDD. Grade 1 anterolisthesis of L4 upon L5 without associated pars defects. IMPRESSION: Vascular  Impression: 1. Widely IVC, proximal thigh and pelvic venous system without evidence of thrombosis or perivascular stranding. No CT evidence of May Thurner syndrome. 2.  Cardiomegaly with small to moderate-sized bilateral pleural effusions, right greater than left, and diffuse body wall anasarca - Constellation of findings are worrisome for congestive heart failure which could contribute to provided history of lower extremity edema. Further evaluation could be performed with cardiac echo as clinically indicated. 3. Coronary calcifications.  Aortic Atherosclerosis (ICD10-I70Destiny0). 4. Potential hemodynamically significant narrowings involving the origin the bilateral renal arteries, right greater than left, suboptimally evaluated secondary to lack of arterial phase imaging and without associated delayed renal enhancement or asymmetric renal atrophy. Nonvascular Impression: 1. Age-indeterminate mild (approximately 25%) compression fracture involving the anterior inferior aspect of the L3 vertebral body. Correlation for point tenderness at this location is advised. Further evaluation could be performed with lumbar spine MRI as indicated. 2. Presumably reactive porta hepatis, pelvic and inguinal lymph nodes in the setting of diffuse body wall anasarca. No definitive bulky retroperitoneal, mesenteric, pelvic or inguinal lymphadenopathy. These results will be called to the ordering clinician or representative by the Radiologist Assistant, and communication documented in the PACS or zVision Dashboard. Electronically Signed   By: Sandi Mariscal MDestinyD.   On: 11/29/2018 14:38        This case was discussed with Dr. Lindi Adie. He expresses agreement with my management of this patient.

## 2018-12-25 NOTE — Patient Instructions (Signed)
Vancomycin injection What is this medicine? VANCOMYCIN Lucianne Lei koe MYE sin) is a glycopeptide antibiotic. It is used to treat certain kinds of bacterial infections. It will not work for colds, flu, or other viral infections. This medicine may be used for other purposes; ask your health care provider or pharmacist if you have questions. COMMON BRAND NAME(S): Glo Herring What should I tell my health care provider before I take this medicine? They need to know if you have any of these conditions:  dehydration  hearing loss  kidney disease  other chronic illness  an unusual or allergic reaction to vancomycin, other medicines, foods, dyes, or preservatives  pregnant or trying to get pregnant  breast-feeding How should I use this medicine? This medicine is infused into a vein. It is usually given by a health care provider in a hospital or clinic. If you receive this medicine at home, you will receive special instructions. Take your medicine at regular intervals. Do not take your medicine more often than directed. Take all of your medicine as directed even if you think you are better. Do not skip doses or stop your medicine early. It is important that you put your used needles and syringes in a special sharps container. Do not put them in a trash can. If you do not have a sharps container, call your pharmacist or healthcare provider to get one. Talk to your pediatrician regarding the use of this medicine in children. While this drug may be prescribed for even very young infants for selected conditions, precautions do apply. Overdosage: If you think you have taken too much of this medicine contact a poison control center or emergency room at once. NOTE: This medicine is only for you. Do not share this medicine with others. What if I miss a dose? If you miss a dose, take it as soon as you can. If it is almost time for your next dose, take only that dose. Do not take double or extra  doses. What may interact with this medicine?  amphotericin B  anesthetics  bacitracin  birth control pills  cisplatin  colistin  diuretics  other aminoglycoside antibiotics  polymyxin B This list may not describe all possible interactions. Give your health care provider a list of all the medicines, herbs, non-prescription drugs, or dietary supplements you use. Also tell them if you smoke, drink alcohol, or use illegal drugs. Some items may interact with your medicine. What should I watch for while using this medicine? Tell your doctor or health care provider if your symptoms do not improve or if you get new symptoms. Your condition and lab work will be monitored while you are taking this medicine. Do not treat diarrhea with over the counter products. Contact your doctor if you have diarrhea that lasts more than 2 days or if it is severe and watery. This medicine may cause serious skin reactions. They can happen weeks to months after starting the medicine. Contact your health care provider right away if you notice fevers or flu-like symptoms with a rash. The rash may be red or purple and then turn into blisters or peeling of the skin. Or, you might notice a red rash with swelling of the face, lips or lymph nodes in your neck or under your arms. What side effects may I notice from receiving this medicine? Side effects that you should report to your doctor or health care professional as soon as possible:  allergic reactions like skin rash, itching or hives, swelling of  the face, lips, or tongue  breathing difficulty, wheezing  change in amount, color of urine  change in hearing  chest pain  dizziness  fever, chills  flushing of the face and neck (reddening)  low blood pressure  rash, fever, and swollen lymph nodes  redness, blistering, peeling or loosening of the skin, including inside the mouth  unusual bleeding or bruising  unusually weak or tired Side effects that  usually do not require medical attention (report to your doctor or health care professional if they continue or are bothersome):  nausea, vomiting  pain, swelling where injected  stomach cramps This list may not describe all possible side effects. Call your doctor for medical advice about side effects. You may report side effects to FDA at 1-800-FDA-1088. Where should I keep my medicine? Keep out of the reach of children. You will be instructed on how to store this medicine, if needed. Throw away any unused medicine after the expiration date on the label. NOTE: This sheet is a summary. It may not cover all possible information. If you have questions about this medicine, talk to your doctor, pharmacist, or health care provider.  2020 Elsevier/Gold Standard (2018-08-30 16:14:12)

## 2018-12-25 NOTE — Patient Instructions (Signed)

## 2018-12-25 NOTE — Progress Notes (Signed)
Patient presents with hx of cellulitis, states [place] reported that bilat lower legs appeared "redder and warmer than usual".  Pt denies pain or tenderness at site.  Denies recent injury.  Afebrile.  Bilat lower leg edema at baseline, no increase in swelling or changes in baseline breathing.  Full sensation in both legs, limited strength and ROM at baseline.  No bleeding or drainage present.  Pt to receive IV Vancomycin in infusion suite.  Pt transferred via wc with belongings by PA Lucianne Lei to infusion suite.  PA gave report to Charge RN Amy.

## 2018-12-26 ENCOUNTER — Other Ambulatory Visit: Payer: Self-pay | Admitting: Medical

## 2018-12-26 ENCOUNTER — Encounter: Payer: Self-pay | Admitting: *Deleted

## 2018-12-26 ENCOUNTER — Other Ambulatory Visit: Payer: Self-pay

## 2018-12-26 MED ORDER — POTASSIUM CHLORIDE CRYS ER 20 MEQ PO TBCR: 20.0000 meq | EXTENDED_RELEASE_TABLET | Freq: Two times a day (BID) | ORAL | 0 refills | Status: DC

## 2019-01-03 DIAGNOSIS — E119 Type 2 diabetes mellitus without complications: Secondary | ICD-10-CM | POA: Diagnosis not present

## 2019-01-03 DIAGNOSIS — Z170 Estrogen receptor positive status [ER+]: Secondary | ICD-10-CM | POA: Diagnosis not present

## 2019-01-03 DIAGNOSIS — Z4801 Encounter for change or removal of surgical wound dressing: Secondary | ICD-10-CM | POA: Diagnosis not present

## 2019-01-03 DIAGNOSIS — C50412 Malignant neoplasm of upper-outer quadrant of left female breast: Secondary | ICD-10-CM | POA: Diagnosis not present

## 2019-01-03 DIAGNOSIS — Z483 Aftercare following surgery for neoplasm: Secondary | ICD-10-CM | POA: Diagnosis not present

## 2019-01-03 DIAGNOSIS — I4819 Other persistent atrial fibrillation: Secondary | ICD-10-CM | POA: Diagnosis not present

## 2019-01-08 ENCOUNTER — Other Ambulatory Visit: Payer: Self-pay

## 2019-01-08 ENCOUNTER — Encounter: Payer: Self-pay | Admitting: Vascular Surgery

## 2019-01-08 ENCOUNTER — Ambulatory Visit (INDEPENDENT_AMBULATORY_CARE_PROVIDER_SITE_OTHER): Payer: Medicare Other | Admitting: Vascular Surgery

## 2019-01-08 ENCOUNTER — Encounter: Payer: Self-pay | Admitting: Hematology and Oncology

## 2019-01-08 VITALS — BP 151/89 | HR 101 | Temp 97.5°F | Resp 18 | Ht 66.0 in | Wt 276.0 lb

## 2019-01-08 DIAGNOSIS — I872 Venous insufficiency (chronic) (peripheral): Secondary | ICD-10-CM | POA: Diagnosis not present

## 2019-01-08 DIAGNOSIS — I83893 Varicose veins of bilateral lower extremities with other complications: Secondary | ICD-10-CM | POA: Diagnosis not present

## 2019-01-08 DIAGNOSIS — I890 Lymphedema, not elsewhere classified: Secondary | ICD-10-CM

## 2019-01-08 DIAGNOSIS — C50412 Malignant neoplasm of upper-outer quadrant of left female breast: Secondary | ICD-10-CM

## 2019-01-08 DIAGNOSIS — Q820 Hereditary lymphedema: Secondary | ICD-10-CM

## 2019-01-08 DIAGNOSIS — Z170 Estrogen receptor positive status [ER+]: Secondary | ICD-10-CM

## 2019-01-08 MED ORDER — LORAZEPAM 0.5 MG PO TABS: 0.5000 mg | ORAL_TABLET | Freq: Three times a day (TID) | ORAL | 0 refills | Status: DC

## 2019-01-08 NOTE — Progress Notes (Signed)
Patient name: Destiny Nash MRN: [id number] DOB: [birth year] Sex: female  REASON FOR VISIT:   Follow-up of bilateral lower extremity swelling.  HPI:   Destiny Nash is a pleasant 73 y.o. female who I saw in consultation on 11/14/2018.  The patient had severe bilateral lower extremity swelling.  Based on my exam I felt that she likely had lymphedema and also chronic venous insufficiency.  Her swelling was significantly limiting her mobility.  She was essentially wheelchair-bound.  Given that the swelling went all the way up to the waist I recommended a CT of the abdomen and pelvis to rule out obstruction of the venous or lymphatic system.   In addition, at that time we discussed all the conservative measures that would potentially help including leg elevation.  We specifically discussed the most efficient position for this.  She is unable to get into compression stockings.  She is unable to exercise because of her weight.  She is unable to do self manual lymphatic drainage techniques and there is no massage therapist in our area who can do this unless the patient has lymphedema secondary to cancer.  She states that the swelling has not improved but she has been trying to elevate her legs more.  She describes aching pain and heaviness in her legs associated with the swelling.  Current Outpatient Medications  Medication Sig Dispense Refill   acetaminophen (TYLENOL) 500 MG tablet Take 1,000 mg by mouth every 6 (six) hours as needed for moderate pain or headache.     albuterol (PROVENTIL HFA;VENTOLIN HFA) 108 (90 Base) MCG/ACT inhaler Inhale 2 puffs into the lungs every 6 (six) hours as needed for wheezing or shortness of breath.     apixaban (ELIQUIS) 5 MG TABS tablet Take 1 tablet (5 mg total) by mouth 2 (two) times daily. 60 tablet 0   cholestyramine (QUESTRAN) 4 g packet Take 1 packet (4 g total) by mouth 3 (three) times daily with meals. (Patient taking differently: Take 4 g by mouth  daily. ) 60 each 12   CINNAMON PO Take 1 tablet by mouth daily.     diltiazem (CARDIZEM CD) 300 MG 24 hr capsule Take 1 capsule (300 mg total) by mouth daily. 90 capsule 3   diphenoxylate-atropine (LOMOTIL) 2.5-0.025 MG tablet Take 1 tablet by mouth 4 (four) times daily as needed for diarrhea or loose stools. 120 tablet 3   insulin detemir (LEVEMIR) 100 UNIT/ML injection Inject 0.11 mLs (11 Units total) into the skin at bedtime. (Patient taking differently: Inject 8 Units into the skin at bedtime. )  0   LORazepam (ATIVAN) 0.5 MG tablet Take 1 tablet (0.5 mg total) by mouth 2 (two) times daily. 60 tablet 0   metFORMIN (GLUCOPHAGE) 500 MG tablet Take 1,000 mg by mouth 2 (two) times daily with a meal.      niacin 500 MG tablet Take 500 mg by mouth daily.     NYSTATIN powder Apply 1 Bottle topically daily as needed (irritation).      Omega-3 1000 MG CAPS Take 1,000 mg by mouth 2 (two) times daily.     ondansetron (ZOFRAN) 8 MG tablet Take 1 tablet (8 mg total) by mouth 2 (two) times daily as needed (Nausea or vomiting). 30 tablet 1   pantoprazole (PROTONIX) 40 MG tablet Take 1 tablet (40 mg total) by mouth daily. 30 tablet 3   potassium chloride SA (K-DUR) 20 MEQ tablet Take 1 tablet (20 mEq total) by mouth 2 (two)  times daily. 60 tablet 0   pravastatin (PRAVACHOL) 20 MG tablet Take 20 mg by mouth daily before breakfast.   0   sulfamethoxazole-trimethoprim (BACTRIM DS) 800-160 MG tablet Take 1 tablet by mouth 2 (two) times daily. 60 tablet 0   traMADol (ULTRAM) 50 MG tablet Take 1 tablet (50 mg total) by mouth every 6 (six) hours as needed. 20 tablet 0   No current facility-administered medications for this visit.     REVIEW OF SYSTEMS:  [X]  denotes positive finding, [ ]  denotes negative finding Vascular    Leg swelling    Cardiac    Chest pain or chest pressure:    Shortness of breath upon exertion:    Short of breath when lying flat:    Irregular heart rhythm:      Constitutional    Fever or chills:     PHYSICAL EXAM:   Vitals:   01/08/19 1059  BP: (!) 151/89  Pulse: (!) 101  Resp: 18  Temp: (!) 97.5 F (36.4 C)  SpO2: 100%  Weight: 276 lb (125.2 kg)  Height: 5' 6"  (1.676 m)    GENERAL: The patient is a well-nourished female, in no acute distress. The vital signs are documented above. CARDIOVASCULAR: There is a regular rate and rhythm. PULMONARY: There is good air exchange bilaterally without wheezing or rales. The patient has severe bilateral lower extremity swelling.  The right calf measures 25-1/2 inches in diameter which has increased from 24-1/4 inches.  The left calf measures 26 inches, which has increased from 24-3/4 inches a month ago.  She has hyperpigmentation bilaterally.  DATA:   CT ABDOMEN PELVIS: I reviewed the images of the CT abdomen pelvis that was done on 11/29/2018.  The patient did have an age-indeterminate mild compression fracture involving the anterior inferior aspect of L3.  There was no definitive bulky retroperitoneal, mesenteric, pelvic, or inguinal lymphadenopathy.  There is no evidence of tumor.  MEDICAL ISSUES:   LYMPHEDEMA TARDUM: This patient has disabling lymphedema in both lower extremities and has failed conservative measures.  Her swelling continues to progress.  I think really the only remaining option would be to get her fitted for the Flexi Touch pneumatic compression system.  I have again discussed with her the importance of leg elevation and the proper positioning for this.  She is simply unable to get compression stockings on.  She is unable to ambulate because of her severe bilateral lower extremity swelling.  I plan on seeing her back in 1 year.  She knows to call sooner if she has problems.  Deitra Mayo [facility] [phone]

## 2019-01-13 ENCOUNTER — Other Ambulatory Visit: Payer: Self-pay

## 2019-01-13 ENCOUNTER — Ambulatory Visit
Admission: RE | Admit: 2019-01-13 | Discharge: 2019-01-13 | Disposition: A | Discharge status: Home / Self Care | Payer: Medicare Other | Source: Ambulatory Visit | Attending: Radiation Oncology | Admitting: Radiation Oncology

## 2019-01-13 ENCOUNTER — Other Ambulatory Visit: Payer: Self-pay | Admitting: Radiation Oncology

## 2019-01-13 DIAGNOSIS — Z170 Estrogen receptor positive status [ER+]: Secondary | ICD-10-CM | POA: Insufficient documentation

## 2019-01-13 DIAGNOSIS — C50412 Malignant neoplasm of upper-outer quadrant of left female breast: Secondary | ICD-10-CM

## 2019-01-13 DIAGNOSIS — Z510 Encounter for antineoplastic radiation therapy: Secondary | ICD-10-CM | POA: Insufficient documentation

## 2019-01-13 MED ORDER — LORAZEPAM 1 MG PO TABS
1.0000 mg | ORAL_TABLET | Freq: Once | ORAL | Status: AC
  Administered 2019-01-13: 1 mg via ORAL
  Filled 2019-01-13: qty 1

## 2019-01-13 MED ORDER — LORAZEPAM 1 MG PO TABS: ORAL_TABLET | ORAL | 0 refills | Status: DC

## 2019-01-13 NOTE — Progress Notes (Signed)
  Radiation Oncology         [phone]) [phone] ________________________________  Name: Destiny Nash MRN: [id number]  Date: 01/13/2019  DOB: [birth year]  SIMULATION AND TREATMENT PLANNING NOTE    Outpatient  DIAGNOSIS:     ICD-10-CM   1. Malignant neoplasm of upper-outer quadrant of left breast in female, estrogen receptor positive (HCC)  C50.412 LORazepam (ATIVAN) tablet 1 mg   Z17.0     NARRATIVE:  The patient was brought to the [facility] [place].  Identity was confirmed.  All relevant records and images related to the planned course of therapy were reviewed.  The patient freely provided informed written consent to proceed with treatment after reviewing the details related to the planned course of therapy. The consent form was witnessed and verified by the simulation staff.    Then, the patient was set-up in a stable reproducible supine position for radiation therapy with her ipsilateral arm over her head, and her upper body secured in a custom-made Vac-lok device.  CT images were obtained.  Surface markings were placed.  The CT images were loaded into the planning software.    TREATMENT PLANNING NOTE: Treatment planning then occurred.  The radiation prescription was entered and confirmed.     A total of 3 medically necessary complex treatment devices were fabricated and supervised by me: 2 fields with MLCs for custom blocks to protect heart, and lungs;  and, a Vac-lok. MORE COMPLEX DEVICES MAY BE MADE IN DOSIMETRY FOR FIELD IN FIELD BEAMS FOR DOSE HOMOGENEITY.  I have requested : 3D Simulation which is medically necessary to give adequate dose to at risk tissues while sparing lungs and heart.  I have requested a DVH of the following structures: lungs, heart, left lumpectomy cavity.    The patient will receive 40.05 Gy in 15 fractions to the left breast with 2 tangential fields.  This will be followed by a boost.  Optical Surface Tracking Plan:  Since intensity modulated  radiotherapy (IMRT) and 3D conformal radiation treatment methods are predicated on accurate and precise positioning for treatment, intrafraction motion monitoring is medically necessary to ensure accurate and safe treatment delivery. The ability to quantify intrafraction motion without excessive ionizing radiation dose can only be performed with optical surface tracking. Accordingly, surface imaging offers the opportunity to obtain 3D measurements of patient position throughout IMRT and 3D treatments without excessive radiation exposure. I am ordering optical surface tracking for this patient's upcoming course of radiotherapy.  ________________________________   Reference:  Ursula Alert, J, et al. Surface imaging-based analysis of intrafraction motion for breast radiotherapy patients.Journal of [place], n. 6, nov. 2014. ISSN [id number].  Available at: <http://www.jacmp.org/index.php/jacmp/article/view/4957>.    -----------------------------------  Eppie Gibson, MD

## 2019-01-14 DIAGNOSIS — Z170 Estrogen receptor positive status [ER+]: Secondary | ICD-10-CM | POA: Diagnosis not present

## 2019-01-14 DIAGNOSIS — C50412 Malignant neoplasm of upper-outer quadrant of left female breast: Secondary | ICD-10-CM | POA: Diagnosis not present

## 2019-01-14 DIAGNOSIS — Z510 Encounter for antineoplastic radiation therapy: Secondary | ICD-10-CM | POA: Diagnosis not present

## 2019-01-15 ENCOUNTER — Emergency Department (HOSPITAL_COMMUNITY): Payer: Medicare Other

## 2019-01-15 ENCOUNTER — Other Ambulatory Visit: Payer: Self-pay

## 2019-01-15 ENCOUNTER — Encounter (HOSPITAL_COMMUNITY): Payer: Self-pay

## 2019-01-15 ENCOUNTER — Telehealth: Payer: Self-pay

## 2019-01-15 ENCOUNTER — Emergency Department (HOSPITAL_COMMUNITY)
Admission: EM | Admit: 2019-01-15 | Discharge: 2019-01-15 | Disposition: A | Discharge status: Home / Self Care | Payer: Medicare Other | Attending: Emergency Medicine | Admitting: Emergency Medicine

## 2019-01-15 DIAGNOSIS — E785 Hyperlipidemia, unspecified: Secondary | ICD-10-CM | POA: Diagnosis not present

## 2019-01-15 DIAGNOSIS — Z853 Personal history of malignant neoplasm of breast: Secondary | ICD-10-CM | POA: Insufficient documentation

## 2019-01-15 DIAGNOSIS — N189 Chronic kidney disease, unspecified: Secondary | ICD-10-CM | POA: Insufficient documentation

## 2019-01-15 DIAGNOSIS — R0602 Shortness of breath: Secondary | ICD-10-CM | POA: Diagnosis not present

## 2019-01-15 DIAGNOSIS — E1122 Type 2 diabetes mellitus with diabetic chronic kidney disease: Secondary | ICD-10-CM | POA: Diagnosis not present

## 2019-01-15 DIAGNOSIS — Z79899 Other long term (current) drug therapy: Secondary | ICD-10-CM | POA: Insufficient documentation

## 2019-01-15 DIAGNOSIS — I4891 Unspecified atrial fibrillation: Secondary | ICD-10-CM | POA: Insufficient documentation

## 2019-01-15 DIAGNOSIS — Z885 Allergy status to narcotic agent status: Secondary | ICD-10-CM | POA: Insufficient documentation

## 2019-01-15 DIAGNOSIS — R0609 Other forms of dyspnea: Secondary | ICD-10-CM | POA: Diagnosis not present

## 2019-01-15 DIAGNOSIS — R0600 Dyspnea, unspecified: Secondary | ICD-10-CM

## 2019-01-15 DIAGNOSIS — J9811 Atelectasis: Secondary | ICD-10-CM | POA: Diagnosis not present

## 2019-01-15 DIAGNOSIS — Z881 Allergy status to other antibiotic agents status: Secondary | ICD-10-CM | POA: Insufficient documentation

## 2019-01-15 DIAGNOSIS — I129 Hypertensive chronic kidney disease with stage 1 through stage 4 chronic kidney disease, or unspecified chronic kidney disease: Secondary | ICD-10-CM | POA: Insufficient documentation

## 2019-01-15 DIAGNOSIS — Z20828 Contact with and (suspected) exposure to other viral communicable diseases: Secondary | ICD-10-CM | POA: Diagnosis not present

## 2019-01-15 DIAGNOSIS — Z7901 Long term (current) use of anticoagulants: Secondary | ICD-10-CM | POA: Diagnosis not present

## 2019-01-15 DIAGNOSIS — Z794 Long term (current) use of insulin: Secondary | ICD-10-CM | POA: Diagnosis not present

## 2019-01-15 LAB — CBC
HCT: 32 % — ABNORMAL LOW (ref 36.0–46.0)
Hemoglobin: 9.3 g/dL — ABNORMAL LOW (ref 12.0–15.0)
MCH: 25 pg — ABNORMAL LOW (ref 26.0–34.0)
MCHC: 29.1 g/dL — ABNORMAL LOW (ref 30.0–36.0)
MCV: 86 fL (ref 80.0–100.0)
Platelets: 301 10*3/uL (ref 150–400)
RBC: 3.72 MIL/uL — ABNORMAL LOW (ref 3.87–5.11)
RDW: 14.9 % (ref 11.5–15.5)
WBC: 8.6 10*3/uL (ref 4.0–10.5)
nRBC: 0 % (ref 0.0–0.2)

## 2019-01-15 LAB — BASIC METABOLIC PANEL
Anion gap: 8 (ref 5–15)
BUN: 32 mg/dL — ABNORMAL HIGH (ref 8–23)
CO2: 27 mmol/L (ref 22–32)
Calcium: 8.7 mg/dL — ABNORMAL LOW (ref 8.9–10.3)
Chloride: 101 mmol/L (ref 98–111)
Creatinine, Ser: 1.03 mg/dL — ABNORMAL HIGH (ref 0.44–1.00)
GFR calc Af Amer: >60 mL/min (ref >60)
GFR calc non Af Amer: 55 mL/min — ABNORMAL LOW (ref >60)
Glucose, Bld: 140 mg/dL — ABNORMAL HIGH (ref 70–99)
Potassium: 5.6 mmol/L — ABNORMAL HIGH (ref 3.5–5.1)
Sodium: 136 mmol/L (ref 135–145)

## 2019-01-15 LAB — SARS CORONAVIRUS 2 BY RT PCR (HOSPITAL ORDER, PERFORMED IN ~~LOC~~ HOSPITAL LAB): SARS Coronavirus 2: NEGATIVE

## 2019-01-15 LAB — BRAIN NATRIURETIC PEPTIDE: B Natriuretic Peptide: 71 pg/mL (ref 0.0–100.0)

## 2019-01-15 MED ORDER — FUROSEMIDE 10 MG/ML IJ SOLN: 40.0000 mg | Freq: Once | INTRAMUSCULAR | Status: DC

## 2019-01-15 MED ORDER — FUROSEMIDE 10 MG/ML IJ SOLN
40.0000 mg | Freq: Once | INTRAMUSCULAR | Status: AC
  Administered 2019-01-15: 40 mg via INTRAVENOUS
  Filled 2019-01-15: qty 4

## 2019-01-15 MED ORDER — LORAZEPAM 2 MG/ML IJ SOLN
1.0000 mg | Freq: Once | INTRAMUSCULAR | Status: AC
  Administered 2019-01-15: 20:00:00 1 mg via INTRAVENOUS
  Filled 2019-01-15: qty 1

## 2019-01-15 MED ORDER — IOHEXOL 350 MG/ML SOLN
100.0000 mL | Freq: Once | INTRAVENOUS | Status: AC | PRN
  Administered 2019-01-15: 100 mL via INTRAVENOUS

## 2019-01-15 NOTE — ED Provider Notes (Signed)
[facility] EMERGENCY DEPARTMENT Provider Note   CSN: [id number] Arrival date & time: 01/15/19  1700     History   Chief Complaint Chief Complaint  Patient presents with   Shortness of Breath    HPI Destiny Nash is a 73 y.o. female.     HPI   4yF with with dyspnea. Worsening over past several months but even more so in last few days. Constant. No cough. No fever. No acute pain. Called doctor's office today and advised to come to the ED. Reports compliance with her medications.   Past Medical History:  Diagnosis Date   Anemia    in the past    Anxiety    Atrial fibrillation ([place])    a. diagnosed in 02/2018.   Bronchitis    easily  develops bronchitis when she has a cold   Cancer ([place])    L breast - [place], Feb. 4th    Cellulitis    Chronic kidney disease    Diabetes mellitus without complication ([place])    type 2   Dyspnea    Dysrhythmia    afib, treating with Eliquis   Family history of breast cancer    Family history of uterine cancer    Heart murmur    Hyperlipidemia    Hypertension     Patient Active Problem List   Diagnosis Date Noted   Hypoglycemia 10/20/2018   Atrial fibrillation ([place]) 10/20/2018   Type 2 diabetes mellitus ([place]) 10/20/2018   Genetic testing 10/14/2018   Family history of breast cancer    Family history of uterine cancer    Port-A-Cath in place 08/09/2018   Malignant neoplasm of upper-outer quadrant of left breast in female, estrogen receptor positive ([place]) 07/25/2018   Unspecified atrial fibrillation ([place]) 02/14/2018   Cellulitis, leg 02/13/2018   Uncontrolled type 2 diabetes mellitus with hyperglycemia, with long-term current use of insulin ([place]) 02/13/2018   Essential hypertension 02/13/2018   Hyperlipidemia 02/13/2018   Obesity, Class III, BMI 40-49.9 (morbid obesity) ([place]) 02/13/2018    Past Surgical History:  Procedure Laterality Date   BREAST LUMPECTOMY WITH RADIOACTIVE SEED AND SENTINEL  LYMPH NODE BIOPSY Left 12/10/2018   Procedure: LEFT BREAST LUMPECTOMY WITH RADIOACTIVE SEED AND LEFT SENTINEL LYMPH NODE [place];  Surgeon: Erroll Luna, MD;  Location: [facility];  Service: General;  Laterality: Left;   DILATION AND CURETTAGE OF UTERUS     x2 on same day   PORTACATH PLACEMENT N/A 08/01/2018   Procedure: INSERTION PORT-A-CATH WITH ULTRASOUND;  Surgeon: Erroll Luna, MD;  Location: [facility];  Service: General;  Laterality: N/A;   TONSILLECTOMY AND ADENOIDECTOMY       OB History   No obstetric history on file.      Home Medications    Prior to Admission medications   Medication Sig Start Date End Date Taking? Authorizing Provider  acetaminophen (TYLENOL) 500 MG tablet Take 1,000 mg by mouth every 6 (six) hours as needed for moderate pain or headache.   Yes [provider]  albuterol (PROVENTIL HFA;VENTOLIN HFA) 108 (90 Base) MCG/ACT inhaler Inhale 2 puffs into the lungs every 6 (six) hours as needed for wheezing or shortness of breath.   Yes [provider]  apixaban (ELIQUIS) 5 MG TABS tablet Take 1 tablet (5 mg total) by mouth 2 (two) times daily. 02/15/18  Yes Tat, Shanon Brow, MD  cholestyramine Lucrezia Starch) 4 g packet Take 1 packet (4 g total) by mouth 3 (three) times daily with meals. Patient taking  differently: Take 4 g by mouth daily.  09/06/18  Yes Nicholas Lose, MD  CINNAMON PO Take 1 tablet by mouth daily.   Yes [provider]  diltiazem (CARDIZEM CD) 300 MG 24 hr capsule Take 1 capsule (300 mg total) by mouth daily. 07/01/18  Yes BranchAlphonse Guild, MD  diphenoxylate-atropine (LOMOTIL) 2.5-0.025 MG tablet Take 1 tablet by mouth 4 (four) times daily as needed for diarrhea or loose stools. 09/06/18  Yes Nicholas Lose, MD  insulin detemir (LEVEMIR) 100 UNIT/ML injection Inject 0.11 mLs (11 Units total) into the skin at bedtime. Patient taking differently: Inject 8 Units into the skin at bedtime.  10/21/18  Yes Barton Dubois, MD  LORazepam (ATIVAN) 0.5  MG tablet Take 1 tablet (0.5 mg total) by mouth 3 (three) times daily. 01/08/19  Yes Nicholas Lose, MD  metFORMIN (GLUCOPHAGE) 500 MG tablet Take 1,000 mg by mouth 2 (two) times daily with a meal.    Yes [provider]  niacin 500 MG tablet Take 500 mg by mouth daily.   Yes [provider]  NYSTATIN powder Apply 1 Bottle topically daily as needed (irritation).  10/29/18  Yes [provider]  Omega-3 1000 MG CAPS Take 1,000 mg by mouth 2 (two) times daily.   Yes [provider]  ondansetron (ZOFRAN) 8 MG tablet Take 1 tablet (8 mg total) by mouth 2 (two) times daily as needed (Nausea or vomiting). 07/25/18  Yes Nicholas Lose, MD  pantoprazole (PROTONIX) 40 MG tablet Take 1 tablet (40 mg total) by mouth daily. 12/09/18  Yes Nicholas Lose, MD  potassium chloride SA (K-DUR) 20 MEQ tablet Take 1 tablet (20 mEq total) by mouth 2 (two) times daily. 12/26/18  Yes Nicholas Lose, MD  pravastatin (PRAVACHOL) 20 MG tablet Take 20 mg by mouth daily before breakfast.  01/03/18  Yes [provider]  sulfamethoxazole-trimethoprim (BACTRIM DS) 800-160 MG tablet Take 1 tablet by mouth 2 (two) times daily. 12/25/18  Yes Tanner, Lyndon Code., PA-C  traMADol (ULTRAM) 50 MG tablet Take 1 tablet (50 mg total) by mouth every 6 (six) hours as needed. 12/10/18 12/10/19 Yes Cornett, Marcello Moores, MD  LORazepam (ATIVAN) 1 MG tablet Take 1 tablet 30 min before radiotherapy, PRN anxiety and claustrophobia. 01/13/19   Eppie Gibson, MD    Family History Family History  Problem Relation Age of Onset   CAD Brother    Hypertension Mother    Hyperlipidemia Mother    Hypertension Brother    Uterine cancer Maternal Grandmother    Breast cancer Cousin        dx in her 96s; mat first cousin   Breast cancer Cousin        dx in her 72s-60s; mat first cousin    Social History Social History   Tobacco Use   Smoking status: Never Smoker   Smokeless tobacco: Never Used  Substance Use Topics    Alcohol use: Not Currently   Drug use: Never     Allergies   Levaquin [levofloxacin] and Codeine   Review of Systems Review of Systems  All systems reviewed and negative, other than as noted in HPI.  Physical Exam Updated Vital Signs BP (!) 152/67    Pulse 94    Temp 97.6 F (36.4 C) (Oral)    Resp 18    Ht 5' 7"  (1.702 m)    Wt 125.6 kg    SpO2 100%    BMI 43.38 kg/m   Physical Exam Vitals signs and  nursing note reviewed.  Constitutional:      General: She is not in acute distress.    Appearance: She is well-developed. She is obese.     Comments: Sitting up in chair. NAD. Appears tired but not toxic.   HENT:     Head: Normocephalic and atraumatic.  Eyes:     General:        Right eye: No discharge.        Left eye: No discharge.     Conjunctiva/sclera: Conjunctivae normal.  Neck:     Musculoskeletal: Neck supple.  Cardiovascular:     Rate and Rhythm: Normal rate. Rhythm irregular.     Heart sounds: Normal heart sounds. No murmur. No friction rub. No gallop.   Pulmonary:     Effort: Pulmonary effort is normal. No respiratory distress.     Breath sounds: Normal breath sounds.  Abdominal:     General: There is no distension.     Palpations: Abdomen is soft.     Tenderness: There is no abdominal tenderness.  Musculoskeletal:        General: No tenderness.     Right lower leg: Edema present.     Left lower leg: Edema present.     Comments: Severe LE edema. Symmetric. Venous stasis changes.   Skin:    General: Skin is warm and dry.  Neurological:     Mental Status: She is alert.  Psychiatric:        Behavior: Behavior normal.        Thought Content: Thought content normal.      ED Treatments / Results  Labs (all labs ordered are listed, but only abnormal results are displayed) Labs Reviewed  BASIC METABOLIC PANEL - Abnormal; Notable for the following components:      Result Value   Potassium 5.6 (*)    Glucose, Bld 140 (*)    BUN 32 (*)    Creatinine,  Ser 1.03 (*)    Calcium 8.7 (*)    GFR calc non Af Amer 55 (*)    All other components within normal limits  CBC - Abnormal; Notable for the following components:   RBC 3.72 (*)    Hemoglobin 9.3 (*)    HCT 32.0 (*)    MCH 25.0 (*)    MCHC 29.1 (*)    All other components within normal limits  SARS CORONAVIRUS 2 (HOSPITAL ORDER, [facility])  BRAIN NATRIURETIC PEPTIDE    EKG None   EKG: (not crossing to MUSE) Rhythm: atrial fibrillation Rate: 98 QRS: 88 ms QTc: 421 ST segments: NS ST changes   Radiology Dg Chest 2 View  Result Date: 01/15/2019 CLINICAL DATA:  Shortness of breath EXAM: CHEST - 2 VIEW COMPARISON:  08/01/2018 FINDINGS: Right-sided central venous port tip over the distal SVC. Small bilateral pleural effusions. Mild right basilar airspace disease. Enlarged cardiomediastinal silhouette. Possible right perihilar airspace disease. Clips in the left axilla. Small fluid level overlying left breast shadow possibly due to recent breast surgery. IMPRESSION: 1. Small bilateral pleural effusion with right basilar atelectasis. Possible right perihilar airspace disease which may reflect atelectasis or infiltrate. Radiographic follow-up is advised 2. Cardiomegaly Electronically Signed   By: Donavan Foil M.D.   On: 01/15/2019 18:33   Ct Angio Chest Pe W And/or Wo Contrast  Result Date: 01/15/2019 CLINICAL DATA:  Progressive shortness of breath x2 months, lower extremity swelling EXAM: CT ANGIOGRAPHY CHEST WITH CONTRAST TECHNIQUE: Multidetector CT imaging of  the chest was performed using the standard protocol during bolus administration of intravenous contrast. Multiplanar CT image reconstructions and MIPs were obtained to evaluate the vascular anatomy. CONTRAST:  134m OMNIPAQUE IOHEXOL 350 MG/ML SOLN COMPARISON:  Chest radiographs dated 01/15/2019 FINDINGS: Cardiovascular: Satisfactory opacification the bilateral pulmonary arteries to the lobar level. No  evidence of pulmonary embolism. Enlargement of the main pulmonary artery, suggesting pulmonary arterial hypertension. No evidence of thoracic aortic aneurysm or dissection. Cardiomegaly. No pericardial effusion. Three vessel coronary atherosclerosis. Right chest port terminates in the mid SVC. Mediastinum/Nodes: Small mediastinal lymph nodes measuring up to 10 mm short axis, likely reactive. Visualized thyroid is unremarkable. Lungs/Pleura: Mild mosaic attenuation/ground-glass opacity in the bilateral upper and lower lobes, suggesting mild interstitial edema. Moderate bilateral pleural effusions. Associated mild dependent atelectasis in the bilateral lower lobes. Mild lingular atelectasis. No focal consolidation. No suspicious pulmonary nodules. No pneumothorax. Upper Abdomen: Visualized upper abdomen is grossly unremarkable, noting vascular calcifications. Musculoskeletal: Degenerative changes of the visualized thoracolumbar spine. Review of the MIP images confirms the above findings. IMPRESSION: No evidence of pulmonary embolism. Cardiomegaly with mild interstitial edema and moderate bilateral pleural effusions. Associated mild bilateral lower lobe atelectasis. Enlargement of the main pulmonary artery, suggesting pulmonary arterial hypertension. Electronically Signed   By: SJulian HyM.D.   On: 01/15/2019 21:29    Procedures Procedures (including critical care time)  Medications Ordered in ED Medications  furosemide (LASIX) injection 40 mg (has no administration in time range)  iohexol (OMNIPAQUE) 350 MG/ML injection 100 mL (100 mLs Intravenous Contrast Given 01/15/19 2054)  LORazepam (ATIVAN) injection 1 mg (1 mg Intravenous Given 01/15/19 2027)     Initial Impression / Assessment and Plan / ED Course  I have reviewed the triage vital signs and the nursing notes.  Pertinent labs & imaging results that were available during my care of the patient were reviewed by me and considered in my  medical decision making (see chart for details).     72yF with dyspnea. Likely multifactorial but I think there is a large component of her being very deconditioned. She is certainly in a difficult situation. She is dealing with multiple medical issues and has severe lymphedema that makes it difficult for her to be more active. She is sedentary at home and even in the ED today she didn't get on the stretcher despite [place] assistance and requested she stay in a wheelchair. CTa is negative for PE. There is some edema but it's pretty mild and some effusions. Her o2 sats have been good in the 95% range on RA. BNP is normal although it is going to be lower in an obese patient. Her anemia is stable. She is in afib but has a history of it and is currently rate controlled. I doubt infectious process.   I don't think there is medical necessity to admit her to the hospital at this time. Her daughter is very unhappy about this. I attempted to address her frustrations. It is certainly very difficult to see a loved one deal with significant health problems. Her mother also has significant anxiety that makes it difficult to even get her out of the house sometimes. I tried to reassure them both. Will dose lasix here in the ED. Will have her increase her lasix at home to BID. BUN/Cr are up and need to be followed closely. I think reasonable to follow-up with her PCP or cardiologist.   Final Clinical Impressions(s) / ED Diagnoses   Final diagnoses:  Dyspnea,  unspecified type    ED Discharge Orders    None       Virgel Manifold, MD 01/15/19 2307

## 2019-01-15 NOTE — ED Triage Notes (Signed)
Pt brought in for progressive increasing of SOB for 2 months. Pt has swelling in lower ext. Pt was told by PCP to come in due to Heart failure

## 2019-01-15 NOTE — Telephone Encounter (Signed)
Returned call to pt's daughter Eustaquio Maize) and informed her of Dr. Nelly Laurence recommendations. She voiced understanding of plan.

## 2019-01-15 NOTE — Discharge Instructions (Addendum)
Increase your lasix to twice a day for the next 5 days. Follow-up with either your PCP or Dr Harl Bowie.

## 2019-01-15 NOTE — Telephone Encounter (Signed)
She is fluid overloaded based on the swellng, but also we had gotten a message from her radiation oncologist letting us know that the images they took showed fluid had built up in the lungs. Only way to get that fluid out is with diuretics like lasix. If it gets bad enough her breathing could get very bad and then we may be in an urgent or emergent situation, we need to avoid this. Really only options are fluid pills at home or admitted to hospital for IV lasix. If breathing and weakness is that bad, and given the degree of fluid noted on her imaging I would suggest bringing her into the hospital, Im not sure we can get this under control at home. She likely will need a few days of IV lasix in the hospital to make sure we get this under control before it becomes a bigger more urgent issue   Zandra Abts MD

## 2019-01-15 NOTE — Telephone Encounter (Signed)
Pt's daughter Destiny Nash called in to inform Dr. Harl Bowie that her mother's SOB is worsening. She was asked by pcp to increase her lasix to 20 mg BID when needed but she is not able to do that as it causes her to go to the bathroom too much. She stated that her mother is already so tired and worn out by radiation and the the lasix made it that much worse. The patient does not weigh daily, but daughter states that her legs are swollen. I offered her several appointments, but none of them work for her. She states it is not easy to bring her mother out. I did tell her if her breathing is worsening she may need to be evaluated in the ED. She also did not like that idea, unless they were going to diurese her there. She would like to know what her options are. Please advise.

## 2019-01-17 NOTE — Telephone Encounter (Signed)
I reviewed the ER notes. The ER is its own entity and make there own clinical decisions, we cannot assure anything about there independent evaluation or there clinical management or recommendations. From there evaluation they felt she did not require admission and recommended home lasix. Has she been able to take her lasix more regularly since being in the ER?    Zandra Abts MD

## 2019-01-20 ENCOUNTER — Ambulatory Visit: Payer: Medicare Other | Admitting: Cardiology

## 2019-01-20 ENCOUNTER — Telehealth: Payer: Self-pay | Admitting: Cardiology

## 2019-01-20 ENCOUNTER — Other Ambulatory Visit: Payer: Self-pay

## 2019-01-20 ENCOUNTER — Ambulatory Visit
Admission: RE | Admit: 2019-01-20 | Discharge: 2019-01-20 | Disposition: A | Discharge status: Home / Self Care | Payer: Medicare Other | Source: Ambulatory Visit | Attending: Radiation Oncology | Admitting: Radiation Oncology

## 2019-01-20 DIAGNOSIS — Z510 Encounter for antineoplastic radiation therapy: Secondary | ICD-10-CM | POA: Diagnosis not present

## 2019-01-20 DIAGNOSIS — Z170 Estrogen receptor positive status [ER+]: Secondary | ICD-10-CM | POA: Diagnosis not present

## 2019-01-20 DIAGNOSIS — C50412 Malignant neoplasm of upper-outer quadrant of left female breast: Secondary | ICD-10-CM

## 2019-01-20 MED ORDER — ALRA NON-METALLIC DEODORANT (RAD-ONC)
1.0000 "application " | Freq: Once | TOPICAL | Status: AC
  Administered 2019-01-20: 1 via TOPICAL

## 2019-01-20 MED ORDER — RADIAPLEXRX EX GEL
Freq: Once | CUTANEOUS | Status: AC
  Administered 2019-01-20: 16:00:00 via TOPICAL

## 2019-01-20 NOTE — Telephone Encounter (Signed)
Please give pt's daughter Eustaquio Maize a call concerning email message

## 2019-01-20 NOTE — Telephone Encounter (Signed)
Arnoldo Lenis, MD  Bernita Raisin, RN        Happy to have patient added on but the first step even if I saw her in clinic would be taking her lasix at home. Really what we need to do is evaluate her in 2-3 weeks after taking her lasix daily as everything points to the fluid being the problem. Can we verify she has been taking her lasix since the ER visit. Can add her on Sept 9th at 61pm    J BrancH MD    Daughter states her mother has been taking her medication for "months" and no one is listening to her.They want her seen sooner and she is very frustrated

## 2019-01-21 ENCOUNTER — Ambulatory Visit
Admission: RE | Admit: 2019-01-21 | Discharge: 2019-01-21 | Disposition: A | Discharge status: Home / Self Care | Payer: Medicare Other | Source: Ambulatory Visit | Attending: Radiation Oncology | Admitting: Radiation Oncology

## 2019-01-21 ENCOUNTER — Encounter: Payer: Self-pay | Admitting: Student

## 2019-01-21 ENCOUNTER — Other Ambulatory Visit: Payer: Self-pay

## 2019-01-21 ENCOUNTER — Ambulatory Visit (INDEPENDENT_AMBULATORY_CARE_PROVIDER_SITE_OTHER): Payer: Medicare Other | Admitting: Student

## 2019-01-21 ENCOUNTER — Encounter: Payer: Self-pay | Admitting: Hematology and Oncology

## 2019-01-21 VITALS — BP 140/74 | Temp 97.5°F | Ht 66.0 in

## 2019-01-21 DIAGNOSIS — I272 Pulmonary hypertension, unspecified: Secondary | ICD-10-CM

## 2019-01-21 DIAGNOSIS — C50412 Malignant neoplasm of upper-outer quadrant of left female breast: Secondary | ICD-10-CM | POA: Diagnosis not present

## 2019-01-21 DIAGNOSIS — Z170 Estrogen receptor positive status [ER+]: Secondary | ICD-10-CM

## 2019-01-21 DIAGNOSIS — I890 Lymphedema, not elsewhere classified: Secondary | ICD-10-CM | POA: Diagnosis not present

## 2019-01-21 DIAGNOSIS — J90 Pleural effusion, not elsewhere classified: Secondary | ICD-10-CM

## 2019-01-21 DIAGNOSIS — Z79899 Other long term (current) drug therapy: Secondary | ICD-10-CM

## 2019-01-21 DIAGNOSIS — I4819 Other persistent atrial fibrillation: Secondary | ICD-10-CM

## 2019-01-21 DIAGNOSIS — Z510 Encounter for antineoplastic radiation therapy: Secondary | ICD-10-CM | POA: Diagnosis not present

## 2019-01-21 MED ORDER — FUROSEMIDE 40 MG PO TABS: 40.0000 mg | ORAL_TABLET | Freq: Every day | ORAL | 3 refills | Status: DC

## 2019-01-21 NOTE — Telephone Encounter (Signed)
Patient has 1330 hrs apt today with B.Strader PA-C

## 2019-01-21 NOTE — Patient Instructions (Signed)
Medication Instructions:  Your physician has recommended you make the following change in your medication:  Increase Lasix to 40 mg to Daily    If you need a refill on your cardiac medications before your next appointment, please call your pharmacy.   Lab work: Your physician recommends that you return for lab work in: 2 Week   If you have labs (blood work) drawn today and your tests are completely normal, you will receive your results only by: Marland Kitchen MyChart Message (if you have MyChart) OR . A paper copy in the mail If you have any lab test that is abnormal or we need to change your treatment, we will call you to review the results.  Testing/Procedures: NONE   Follow-Up: At [facility], you and your health needs are our priority.  As part of our continuing mission to provide you with exceptional heart care, we have created designated Provider Care Teams.  These Care Teams include your primary Cardiologist (physician) and Advanced Practice Providers (APPs -  Physician Assistants and Nurse Practitioners) who all work together to provide you with the care you need, when you need it. .   Any Other Special Instructions Will Be Listed Below (If Applicable). Thank you for choosing [facility]!

## 2019-01-21 NOTE — Telephone Encounter (Signed)
From the august 12th telephone note we were told she was not able to increase her lasix to 32m bid as her pcp had recommended. Will look to get it sorted out during her appointment today.   JZandra AbtsMD

## 2019-01-21 NOTE — Progress Notes (Signed)
Cardiology Office Note    Date:  01/21/2019   ID:  Destiny Nash, DOB Apr 17, 1946, MRN [id number]  PCP:  [name], MD  Cardiologist: Carlyle Dolly, MD    Chief Complaint  Patient presents with  . Follow-up    Dyspnea and Lower Extremity Edema    History of Present Illness:    Destiny Nash is a 73 y.o. female with past medical history of persistent atrial fibrillation (diagnosed in 02/2018), pulmonary HTN, HLD, and breast cancer (s/p left breast lumpectomy in 12/2018, undergoing chemotherapy/radiation) who presents to the office today for evaluation of worsening edema.   She most recently had a telehealth visit in 11/2018 with Dr. Harl Bowie and denied any recent palpitations or issues with anticoagulation at that time. Further work-up for her pulmonary hypertension was reviewed and she wished to hold off on a sleep study or PFT's at that time. She had been evaluated by Vascular Surgery for lower extremity edema and this was thought to be secondary to lymphedema and venous insufficiency. Weight was 273 lbs on 11/26/2018 and she was continued on her current medication regimen.  She was evaluated by Dr. Scot Dock on 01/08/2019 and findings were again felt to be consistent with lymphedema. Continued elevation of her lower extremities was recommended with consideration of a pneumatic compression system if symptoms did not improve.  Her daughter called the office on 01/15/2019 reporting worsening edema and dyspnea. Lasix had been increased by her PCP to 20 mg twice daily but frequent urination caused worsening fatigue. ED evaluation was recommended. Lab work at that time showed WBC 8.6, Hgb 9.3, platelets 301, Na+ 136, K+ 5.6, and creatinine 1.03. BNP 71. CXR showed small bilateral pleural effusions. CTA showed no evidence of a PE but she did have cardiomegaly with mild interstitial edema and moderate effusions.  She received IV Lasix while in the ED and was discharged home with close  follow-up recommended.  In talking with the patient and her daughter today, she reports worsening lower extremity edema over the past month. Has also noticed worsening dyspnea on exertion and orthopnea. Now sleeping in her recliner. She has only been taking Lasix 54m daily due to frequent urination and weakness. Denies any specific chest pain or palpitations. Unable to weigh at home as she is unsteady on her feet.   Past Medical History:  Diagnosis Date  . Anemia    in the past   . Anxiety   . Atrial fibrillation ([place]    a. diagnosed in 02/2018.  . Bronchitis    easily  develops bronchitis when she has a cold  . Cancer ([place]    L breast - [place], Feb. 4th   . Cellulitis   . Chronic kidney disease   . Diabetes mellitus without complication ([place]    type 2  . Dyspnea   . Dysrhythmia    afib, treating with Eliquis  . Family history of breast cancer   . Family history of uterine cancer   . Heart murmur   . Hyperlipidemia   . Hypertension     Past Surgical History:  Procedure Laterality Date  . BREAST LUMPECTOMY WITH RADIOACTIVE SEED AND SENTINEL LYMPH NODE BIOPSY Left 12/10/2018   Procedure: LEFT BREAST LUMPECTOMY WITH RADIOACTIVE SEED AND LEFT SENTINEL LYMPH NODE MAPPING;  Surgeon: CErroll Luna MD;  [facility]  Service: General;  Laterality: Left;  . DILATION AND CURETTAGE OF UTERUS     x2 on same day  . PORTACATH PLACEMENT N/A  08/01/2018   Procedure: INSERTION PORT-A-CATH WITH ULTRASOUND;  Surgeon: Erroll Luna, MD;  Location: [facility];  Service: General;  Laterality: N/A;  . TONSILLECTOMY AND ADENOIDECTOMY      Current Medications: Outpatient Medications Prior to Visit  Medication Sig Dispense Refill  . acetaminophen (TYLENOL) 500 MG tablet Take 1,000 mg by mouth every 6 (six) hours as needed for moderate pain or headache.    . albuterol (PROVENTIL HFA;VENTOLIN HFA) 108 (90 Base) MCG/ACT inhaler Inhale 2 puffs into the lungs every 6 (six) hours as needed for wheezing  or shortness of breath.    Marland Kitchen apixaban (ELIQUIS) 5 MG TABS tablet Take 1 tablet (5 mg total) by mouth 2 (two) times daily. 60 tablet 0  . cholestyramine (QUESTRAN) 4 g packet Take 1 packet (4 g total) by mouth 3 (three) times daily with meals. (Patient taking differently: Take 4 g by mouth daily. ) 60 each 12  . CINNAMON PO Take 1 tablet by mouth daily.    Marland Kitchen diltiazem (CARDIZEM CD) 300 MG 24 hr capsule Take 1 capsule (300 mg total) by mouth daily. 90 capsule 3  . diphenoxylate-atropine (LOMOTIL) 2.5-0.025 MG tablet Take 1 tablet by mouth 4 (four) times daily as needed for diarrhea or loose stools. 120 tablet 3  . insulin detemir (LEVEMIR) 100 UNIT/ML injection Inject 0.11 mLs (11 Units total) into the skin at bedtime. (Patient taking differently: Inject 8 Units into the skin at bedtime. )  0  . LORazepam (ATIVAN) 0.5 MG tablet Take 1 tablet (0.5 mg total) by mouth 3 (three) times daily. 90 tablet 0  . LORazepam (ATIVAN) 1 MG tablet Take 1 tablet 30 min before radiotherapy, PRN anxiety and claustrophobia. 20 tablet 0  . metFORMIN (GLUCOPHAGE) 500 MG tablet Take 1,000 mg by mouth 2 (two) times daily with a meal.     . niacin 500 MG tablet Take 500 mg by mouth daily.    . NYSTATIN powder Apply 1 Bottle topically daily as needed (irritation).     . Omega-3 1000 MG CAPS Take 1,000 mg by mouth 2 (two) times daily.    . ondansetron (ZOFRAN) 8 MG tablet Take 1 tablet (8 mg total) by mouth 2 (two) times daily as needed (Nausea or vomiting). 30 tablet 1  . pantoprazole (PROTONIX) 40 MG tablet Take 1 tablet (40 mg total) by mouth daily. 30 tablet 3  . potassium chloride SA (K-DUR) 20 MEQ tablet Take 1 tablet (20 mEq total) by mouth 2 (two) times daily. 60 tablet 0  . pravastatin (PRAVACHOL) 20 MG tablet Take 20 mg by mouth daily before breakfast.   0  . sulfamethoxazole-trimethoprim (BACTRIM DS) 800-160 MG tablet Take 1 tablet by mouth 2 (two) times daily. 60 tablet 0  . traMADol (ULTRAM) 50 MG tablet Take 1  tablet (50 mg total) by mouth every 6 (six) hours as needed. 20 tablet 0   No facility-administered medications prior to visit.      Allergies:   Levaquin [levofloxacin] and Codeine   Social History   Socioeconomic History  . Marital status: Widowed    Spouse name: Not on file  . Number of children: Not on file  . Years of education: Not on file  . Highest education level: Not on file  Occupational History  . Not on file  Social Needs  . Financial resource strain: Not on file  . Food insecurity    Worry: Not on file    Inability: Not on file  .  Transportation needs    Medical: No    Non-medical: No  Tobacco Use  . Smoking status: Never Smoker  . Smokeless tobacco: Never Used  Substance and Sexual Activity  . Alcohol use: Not Currently  . Drug use: Never  . Sexual activity: Not Currently  Lifestyle  . Physical activity    Days per week: Not on file    Minutes per session: Not on file  . Stress: Not on file  Relationships  . Social Herbalist on phone: Not on file    Gets together: Not on file    Attends religious service: Not on file    Active member of club or organization: Not on file    Attends meetings of clubs or organizations: Not on file    Relationship status: Not on file  Other Topics Concern  . Not on file  Social History Narrative   Her husband passed away on Jul 03, 2018.   Religion: Christian.     Family History:  The patient's family history includes Breast cancer in her cousin and cousin; CAD in her brother; Hyperlipidemia in her mother; Hypertension in her brother and mother; Uterine cancer in her maternal grandmother.   Review of Systems:   Please see the history of present illness.     General:  No chills, fever, night sweats or weight changes.  Cardiovascular:  No chest pain, palpitations, paroxysmal nocturnal dyspnea. Positive for edema, orthopnea, and dyspnea on exertion.  Dermatological: No rash, lesions/masses Respiratory: No  cough, dyspnea Urologic: No hematuria, dysuria Abdominal:   No nausea, vomiting, diarrhea, bright red blood per rectum, melena, or hematemesis Neurologic:  No visual changes, wkns, changes in mental status. All other systems reviewed and are otherwise negative except as noted above.   Physical Exam:    VS:  BP 140/74 (BP Location: Right Wrist)   Temp (!) 97.5 F (36.4 C)   Ht 5' 6"  (1.676 m)   BMI 44.71 kg/m    General: Well developed, well nourished,female appearing in no acute distress. Head: Normocephalic, atraumatic, sclera non-icteric, no xanthomas, nares are without discharge.  Neck: No carotid bruits. JVD not elevated.  Lungs: Respirations regular and unlabored, decreased breath sounds along bases bilaterally. Heart: Irregularly irregular. No S3 or S4.  No murmur, no rubs, or gallops appreciated. Abdomen: Soft, non-tender, non-distended with normoactive bowel sounds. No hepatomegaly. No rebound/guarding. No obvious abdominal masses. Msk:  Strength and tone appear normal for age. No joint deformities or effusions. Extremities: No clubbing or cyanosis. 2+ pitting edema up to knees bilaterally.  Distal pedal pulses are 2+ bilaterally. Neuro: Alert and oriented X 3. Moves all extremities spontaneously. No focal deficits noted. Psych:  Responds to questions appropriately with a normal affect. Skin: No rashes or lesions noted  Wt Readings from Last 3 Encounters:  01/15/19 277 lb (125.6 kg)  01/08/19 276 lb (125.2 kg)  12/10/18 273 lb (123.8 kg)     Studies/Labs Reviewed:   EKG:  EKG is not ordered today.   Recent Labs: 02/14/2018: TSH 1.862 02/15/2018: Magnesium 1.7 12/18/2018: ALT 10 01/15/2019: B Natriuretic Peptide 71.0; BUN 32; Creatinine, Ser 1.03; Hemoglobin 9.3; Platelets 301; Potassium 5.6; Sodium 136   Lipid Panel No results found for: CHOL, TRIG, HDL, CHOLHDL, VLDL, LDLCALC, LDLDIRECT  Additional studies/ records that were reviewed today include:    Echocardiogram: 11/2018 IMPRESSIONS   1. The left ventricle has hyperdynamic systolic function, with an ejection fraction of >65%. The cavity size was normal. There  is mildly increased left ventricular wall thickness. Left ventricular diastolic Doppler parameters are indeterminate. No  evidence of left ventricular regional wall motion abnormalities. GLS -12.8%.  2. The right ventricle has normal systolic function. The cavity was normal. There is no increase in right ventricular wall thickness.  3. Left atrial size was mildly dilated.  4. Mild calcification of the mitral valve leaflet. No evidence of mitral valve stenosis. Trivial mitral regurgitation.  5. The aortic valve is tricuspid. Moderate calcification of the aortic valve. No stenosis of the aortic valve.  6. The aortic root is normal in size and structure.  7. The inferior vena cava was dilated in size with <50% respiratory variability. PA systolic pressure 57 mmHg.  8. The patient appeared to be in atrial fibrillation.  Assessment:    1. Lymphedema   2. Pleural effusion, bilateral   3. Persistent atrial fibrillation   4. Pulmonary hypertension, unspecified ([place])   5. Medication management   6. Malignant neoplasm of upper-outer quadrant of left breast in female, estrogen receptor positive ([place])      Plan:   In order of problems listed above:  1. Lymphedema/ Bilateral Pleural Effusions - She has experienced worsening lower extremity edema since being started on chemotherapy/radiation and was referred to Vascular Surgery with conservative measures recommended at that time. Also noted to have pleural effusions by recent CT Imaging. She is unable to weigh at home and did not feel strong enough today to obtain a standing weight in office. She has only been taking Lasix 20 mg daily instead of twice daily due to her frequent office visits. Reviewed her schedule and will plan to have her take 40 mg in the AM (she wakes up at 0500 -  0530) which should work given her radiation appointments for the next few weeks are in the afternoon. Given her significant edema, will order Home Health PT for [facility] placement. Recheck BMET in 2 weeks. If symptoms do not improve with diuretic dose adjustment and [employer], she may require admission but will try to avoid given she is undergoing daily radiation treatments at this time.   2. Persistent Atrial Fibrillation -She denies any recent palpitations. Continue Cardizem CD 300 mg daily for rate control. - No evidence of active bleeding. Hemoglobin stable at 9.3 by recent labs on 01/15/2019. Continue Eliquis 5 mg twice daily.  3. Pulmonary HTN - PASP elevated to 57 mmHg by recent echocardiogram. She wishes to hold off on further testing until after she has finished her breast cancer treatments. Would be unable to have a sleep study at this time given her orthopnea.  4. Breast Cancer - s/p left breast lumpectomy in 12/2018, undergoing chemotherapy/radiation. Followed by Dr. Lindi Adie and Dr. Isidore Moos.    Medication Adjustments/Labs and Tests Ordered: Current medicines are reviewed at length with the patient today.  Concerns regarding medicines are outlined above.  Medication changes, Labs and Tests ordered today are listed in the Patient Instructions below. Patient Instructions  Medication Instructions:  Your physician has recommended you make the following change in your medication:  Increase Lasix to 40 mg to Daily    If you need a refill on your cardiac medications before your next appointment, please call your pharmacy.   Lab work: Your physician recommends that you return for lab work in: 2 Week   If you have labs (blood work) drawn today and your tests are completely normal, you will receive your results only by: Marland Kitchen MyChart Message (if you  have MyChart) OR . A paper copy in the mail If you have any lab test that is abnormal or we need to change your treatment, we will call you to  review the results.  Testing/Procedures: NONE   Follow-Up: At [facility], you and your health needs are our priority.  As part of our continuing mission to provide you with exceptional heart care, we have created designated Provider Care Teams.  These Care Teams include your primary Cardiologist (physician) and Advanced Practice Providers (APPs -  Physician Assistants and Nurse Practitioners) who all work together to provide you with the care you need, when you need it. .   Any Other Special Instructions Will Be Listed Below (If Applicable). Thank you for choosing [facility]!      Signed, Erma Heritage, PA-C  01/21/2019 5:01 PM    [facility] [address] [place], [place] [address] Phone: [phone] Fax: [phone]

## 2019-01-22 ENCOUNTER — Other Ambulatory Visit: Payer: Self-pay

## 2019-01-22 ENCOUNTER — Ambulatory Visit
Admission: RE | Admit: 2019-01-22 | Discharge: 2019-01-22 | Disposition: A | Discharge status: Home / Self Care | Payer: Medicare Other | Source: Ambulatory Visit | Attending: Radiation Oncology | Admitting: Radiation Oncology

## 2019-01-22 DIAGNOSIS — Z170 Estrogen receptor positive status [ER+]: Secondary | ICD-10-CM | POA: Diagnosis not present

## 2019-01-22 DIAGNOSIS — C50412 Malignant neoplasm of upper-outer quadrant of left female breast: Secondary | ICD-10-CM | POA: Diagnosis not present

## 2019-01-22 DIAGNOSIS — Z510 Encounter for antineoplastic radiation therapy: Secondary | ICD-10-CM | POA: Diagnosis not present

## 2019-01-23 ENCOUNTER — Ambulatory Visit
Admission: RE | Admit: 2019-01-23 | Discharge: 2019-01-23 | Disposition: A | Discharge status: Home / Self Care | Payer: Medicare Other | Source: Ambulatory Visit | Attending: Radiation Oncology | Admitting: Radiation Oncology

## 2019-01-23 ENCOUNTER — Other Ambulatory Visit: Payer: Self-pay

## 2019-01-23 DIAGNOSIS — C50412 Malignant neoplasm of upper-outer quadrant of left female breast: Secondary | ICD-10-CM | POA: Diagnosis not present

## 2019-01-23 DIAGNOSIS — Z170 Estrogen receptor positive status [ER+]: Secondary | ICD-10-CM | POA: Diagnosis not present

## 2019-01-23 DIAGNOSIS — Z510 Encounter for antineoplastic radiation therapy: Secondary | ICD-10-CM | POA: Diagnosis not present

## 2019-01-24 ENCOUNTER — Ambulatory Visit
Admission: RE | Admit: 2019-01-24 | Discharge: 2019-01-24 | Disposition: A | Discharge status: Home / Self Care | Payer: Medicare Other | Source: Ambulatory Visit | Attending: Radiation Oncology | Admitting: Radiation Oncology

## 2019-01-24 ENCOUNTER — Other Ambulatory Visit: Payer: Self-pay

## 2019-01-24 DIAGNOSIS — Z170 Estrogen receptor positive status [ER+]: Secondary | ICD-10-CM | POA: Diagnosis not present

## 2019-01-24 DIAGNOSIS — Z510 Encounter for antineoplastic radiation therapy: Secondary | ICD-10-CM | POA: Diagnosis not present

## 2019-01-24 DIAGNOSIS — C50412 Malignant neoplasm of upper-outer quadrant of left female breast: Secondary | ICD-10-CM | POA: Diagnosis not present

## 2019-01-26 ENCOUNTER — Encounter: Payer: Self-pay | Admitting: Hematology and Oncology

## 2019-01-26 DIAGNOSIS — L03119 Cellulitis of unspecified part of limb: Secondary | ICD-10-CM

## 2019-01-27 ENCOUNTER — Other Ambulatory Visit: Payer: Self-pay

## 2019-01-27 ENCOUNTER — Ambulatory Visit
Admission: RE | Admit: 2019-01-27 | Discharge: 2019-01-27 | Disposition: A | Discharge status: Home / Self Care | Payer: Medicare Other | Source: Ambulatory Visit | Attending: Radiation Oncology | Admitting: Radiation Oncology

## 2019-01-27 ENCOUNTER — Telehealth: Payer: Self-pay | Admitting: *Deleted

## 2019-01-27 DIAGNOSIS — C50412 Malignant neoplasm of upper-outer quadrant of left female breast: Secondary | ICD-10-CM | POA: Diagnosis not present

## 2019-01-27 DIAGNOSIS — Z510 Encounter for antineoplastic radiation therapy: Secondary | ICD-10-CM | POA: Diagnosis not present

## 2019-01-27 DIAGNOSIS — Z170 Estrogen receptor positive status [ER+]: Secondary | ICD-10-CM | POA: Diagnosis not present

## 2019-01-27 NOTE — Telephone Encounter (Signed)
Received call from pt daughter Eustaquio Maize stating pt finished the Bactrim antibiotic with no relief in cellulitis symptoms.  Per Dr. Lindi Adie, pt to start taking Doxycycline 100 mg BID and ambulatory referral placed for Infectious Disease evaluation.  Beth states pt still has Doxycycline left from a month ago and will continue to take those.  RN placed call to [facility] for Infectious Disease [phone]) and LVM for referral coordinator.

## 2019-01-28 ENCOUNTER — Other Ambulatory Visit: Payer: Self-pay

## 2019-01-28 ENCOUNTER — Ambulatory Visit
Admission: RE | Admit: 2019-01-28 | Discharge: 2019-01-28 | Disposition: A | Discharge status: Home / Self Care | Payer: Medicare Other | Source: Ambulatory Visit | Attending: Radiation Oncology | Admitting: Radiation Oncology

## 2019-01-28 ENCOUNTER — Telehealth: Payer: Self-pay | Admitting: *Deleted

## 2019-01-28 DIAGNOSIS — Z510 Encounter for antineoplastic radiation therapy: Secondary | ICD-10-CM | POA: Diagnosis not present

## 2019-01-28 DIAGNOSIS — Z170 Estrogen receptor positive status [ER+]: Secondary | ICD-10-CM | POA: Diagnosis not present

## 2019-01-28 DIAGNOSIS — C50412 Malignant neoplasm of upper-outer quadrant of left female breast: Secondary | ICD-10-CM | POA: Diagnosis not present

## 2019-01-28 NOTE — Telephone Encounter (Signed)
Called to notify pt that patient assistance Eliquis has arrived in office. No answer. Left msg to call back.

## 2019-01-29 ENCOUNTER — Other Ambulatory Visit: Payer: Self-pay

## 2019-01-29 ENCOUNTER — Ambulatory Visit
Admission: RE | Admit: 2019-01-29 | Discharge: 2019-01-29 | Disposition: A | Discharge status: Home / Self Care | Payer: Medicare Other | Source: Ambulatory Visit | Attending: Radiation Oncology | Admitting: Radiation Oncology

## 2019-01-29 ENCOUNTER — Other Ambulatory Visit: Payer: Medicare Other

## 2019-01-29 ENCOUNTER — Ambulatory Visit: Payer: Medicare Other

## 2019-01-29 ENCOUNTER — Other Ambulatory Visit: Payer: Self-pay | Admitting: Hematology and Oncology

## 2019-01-29 ENCOUNTER — Ambulatory Visit: Payer: Medicare Other | Admitting: Adult Health

## 2019-01-29 DIAGNOSIS — Z170 Estrogen receptor positive status [ER+]: Secondary | ICD-10-CM | POA: Diagnosis not present

## 2019-01-29 DIAGNOSIS — C50412 Malignant neoplasm of upper-outer quadrant of left female breast: Secondary | ICD-10-CM

## 2019-01-29 DIAGNOSIS — Z510 Encounter for antineoplastic radiation therapy: Secondary | ICD-10-CM | POA: Diagnosis not present

## 2019-01-29 NOTE — Progress Notes (Signed)
DISCONTINUE OFF PATHWAY REGIMEN - Breast   [id number]:Weekly paclitaxel + (trastuzumab + pertuzumab q21 days):   A cycle is every 21 days:     Pertuzumab      Pertuzumab      Trastuzumab-xxxx      Trastuzumab-xxxx      Paclitaxel   **Always confirm dose/schedule in your pharmacy ordering system**  REASON: Other Reason PRIOR TREATMENT: Off Pathway: Weekly paclitaxel + (trastuzumab + pertuzumab q21 days) TREATMENT RESPONSE: Partial Response (PR)  START ON PATHWAY REGIMEN - Breast     A cycle is every 21 days:     Ado-trastuzumab emtansine   **Always confirm dose/schedule in your pharmacy ordering system**  Patient Characteristics: Post-Neoadjuvant Therapy and Resection, HER2 Positive, ER Positive, Residual Disease, Adjuvant Targeted Therapy After Neoadjuvant Chemo/Targeted Therapy Therapeutic Status: Post-Neoadjuvant Therapy and Resection ER Status: Positive (+) HER2 Status: Positive (+) PR Status: Positive (+) Residual Invasive Disease Post-Neoadjuvant Therapy<= Yes Intent of Therapy: Curative Intent, Discussed with Patient

## 2019-01-30 ENCOUNTER — Ambulatory Visit
Admission: RE | Admit: 2019-01-30 | Discharge: 2019-01-30 | Disposition: A | Discharge status: Home / Self Care | Payer: Medicare Other | Source: Ambulatory Visit | Attending: Radiation Oncology | Admitting: Radiation Oncology

## 2019-01-30 ENCOUNTER — Other Ambulatory Visit: Payer: Self-pay

## 2019-01-30 DIAGNOSIS — C50412 Malignant neoplasm of upper-outer quadrant of left female breast: Secondary | ICD-10-CM | POA: Diagnosis not present

## 2019-01-30 DIAGNOSIS — Z510 Encounter for antineoplastic radiation therapy: Secondary | ICD-10-CM | POA: Diagnosis not present

## 2019-01-30 DIAGNOSIS — Z170 Estrogen receptor positive status [ER+]: Secondary | ICD-10-CM | POA: Diagnosis not present

## 2019-01-31 ENCOUNTER — Inpatient Hospital Stay: Payer: Medicare Other

## 2019-01-31 ENCOUNTER — Encounter: Payer: Self-pay | Admitting: Adult Health

## 2019-01-31 ENCOUNTER — Inpatient Hospital Stay (HOSPITAL_BASED_OUTPATIENT_CLINIC_OR_DEPARTMENT_OTHER): Payer: Medicare Other | Admitting: Adult Health

## 2019-01-31 ENCOUNTER — Other Ambulatory Visit: Payer: Self-pay

## 2019-01-31 ENCOUNTER — Ambulatory Visit
Admission: RE | Admit: 2019-01-31 | Discharge: 2019-01-31 | Disposition: A | Discharge status: Home / Self Care | Payer: Medicare Other | Source: Ambulatory Visit | Attending: Radiation Oncology | Admitting: Radiation Oncology

## 2019-01-31 ENCOUNTER — Inpatient Hospital Stay: Payer: Medicare Other | Attending: Hematology and Oncology

## 2019-01-31 VITALS — BP 138/72 | HR 96 | Temp 97.8°F | Resp 17

## 2019-01-31 VITALS — BP 126/64 | HR 78 | Temp 97.7°F | Resp 16

## 2019-01-31 DIAGNOSIS — L03116 Cellulitis of left lower limb: Secondary | ICD-10-CM | POA: Diagnosis not present

## 2019-01-31 DIAGNOSIS — Z8249 Family history of ischemic heart disease and other diseases of the circulatory system: Secondary | ICD-10-CM | POA: Diagnosis not present

## 2019-01-31 DIAGNOSIS — C50412 Malignant neoplasm of upper-outer quadrant of left female breast: Secondary | ICD-10-CM

## 2019-01-31 DIAGNOSIS — Z803 Family history of malignant neoplasm of breast: Secondary | ICD-10-CM | POA: Insufficient documentation

## 2019-01-31 DIAGNOSIS — L03115 Cellulitis of right lower limb: Secondary | ICD-10-CM | POA: Insufficient documentation

## 2019-01-31 DIAGNOSIS — R5383 Other fatigue: Secondary | ICD-10-CM | POA: Insufficient documentation

## 2019-01-31 DIAGNOSIS — Z170 Estrogen receptor positive status [ER+]: Secondary | ICD-10-CM

## 2019-01-31 DIAGNOSIS — I4891 Unspecified atrial fibrillation: Secondary | ICD-10-CM | POA: Insufficient documentation

## 2019-01-31 DIAGNOSIS — Z853 Personal history of malignant neoplasm of breast: Secondary | ICD-10-CM | POA: Diagnosis not present

## 2019-01-31 DIAGNOSIS — M7989 Other specified soft tissue disorders: Secondary | ICD-10-CM | POA: Diagnosis not present

## 2019-01-31 DIAGNOSIS — I872 Venous insufficiency (chronic) (peripheral): Secondary | ICD-10-CM | POA: Insufficient documentation

## 2019-01-31 DIAGNOSIS — Z5112 Encounter for antineoplastic immunotherapy: Secondary | ICD-10-CM | POA: Insufficient documentation

## 2019-01-31 DIAGNOSIS — Z510 Encounter for antineoplastic radiation therapy: Secondary | ICD-10-CM | POA: Diagnosis not present

## 2019-01-31 LAB — CBC WITH DIFFERENTIAL (CANCER CENTER ONLY)
Abs Immature Granulocytes: 0.04 10*3/uL (ref 0.00–0.07)
Basophils Absolute: 0 10*3/uL (ref 0.0–0.1)
Basophils Relative: 0 %
Eosinophils Absolute: 0.2 10*3/uL (ref 0.0–0.5)
Eosinophils Relative: 3 %
HCT: 30 % — ABNORMAL LOW (ref 36.0–46.0)
Hemoglobin: 9.2 g/dL — ABNORMAL LOW (ref 12.0–15.0)
Immature Granulocytes: 1 %
Lymphocytes Relative: 9 %
Lymphs Abs: 0.7 10*3/uL (ref 0.7–4.0)
MCH: 25.7 pg — ABNORMAL LOW (ref 26.0–34.0)
MCHC: 30.7 g/dL (ref 30.0–36.0)
MCV: 83.8 fL (ref 80.0–100.0)
Monocytes Absolute: 0.6 10*3/uL (ref 0.1–1.0)
Monocytes Relative: 7 %
Neutro Abs: 6.6 10*3/uL (ref 1.7–7.7)
Neutrophils Relative %: 80 %
Platelet Count: 221 10*3/uL (ref 150–400)
RBC: 3.58 MIL/uL — ABNORMAL LOW (ref 3.87–5.11)
RDW: 15.9 % — ABNORMAL HIGH (ref 11.5–15.5)
WBC Count: 8.1 10*3/uL (ref 4.0–10.5)
nRBC: 0 % (ref 0.0–0.2)

## 2019-01-31 LAB — CMP (CANCER CENTER ONLY)
ALT: 6 U/L (ref 0–44)
AST: 11 U/L — ABNORMAL LOW (ref 15–41)
Albumin: 3.2 g/dL — ABNORMAL LOW (ref 3.5–5.0)
Alkaline Phosphatase: 110 U/L (ref 38–126)
Anion gap: 10 (ref 5–15)
BUN: 22 mg/dL (ref 8–23)
CO2: 32 mmol/L (ref 22–32)
Calcium: 8 mg/dL — ABNORMAL LOW (ref 8.9–10.3)
Chloride: 98 mmol/L (ref 98–111)
Creatinine: 0.83 mg/dL (ref 0.44–1.00)
GFR, Est AFR Am: >60 mL/min (ref >60)
GFR, Estimated: >60 mL/min (ref >60)
Glucose, Bld: 143 mg/dL — ABNORMAL HIGH (ref 70–99)
Potassium: 3.9 mmol/L (ref 3.5–5.1)
Sodium: 140 mmol/L (ref 135–145)
Total Bilirubin: 0.3 mg/dL (ref 0.3–1.2)
Total Protein: 6.5 g/dL (ref 6.5–8.1)

## 2019-01-31 MED ORDER — DIPHENHYDRAMINE HCL 25 MG PO CAPS
50.0000 mg | ORAL_CAPSULE | Freq: Once | ORAL | Status: AC
  Administered 2019-01-31: 50 mg via ORAL

## 2019-01-31 MED ORDER — SODIUM CHLORIDE 0.9 % IV SOLN
3.6000 mg/kg | Freq: Once | INTRAVENOUS | Status: AC
  Administered 2019-01-31: 460 mg via INTRAVENOUS
  Filled 2019-01-31: qty 15

## 2019-01-31 MED ORDER — ACETAMINOPHEN 325 MG PO TABS
650.0000 mg | ORAL_TABLET | Freq: Once | ORAL | Status: AC
  Administered 2019-01-31: 13:00:00 650 mg via ORAL

## 2019-01-31 MED ORDER — SODIUM CHLORIDE 0.9 % IV SOLN
Freq: Once | INTRAVENOUS | Status: AC
  Administered 2019-01-31: 13:00:00 via INTRAVENOUS
  Filled 2019-01-31: qty 250

## 2019-01-31 MED ORDER — SODIUM CHLORIDE 0.9% FLUSH
10.0000 mL | INTRAVENOUS | Status: DC | PRN
  Administered 2019-01-31: 17:00:00 10 mL
  Filled 2019-01-31: qty 10

## 2019-01-31 MED ORDER — DIPHENHYDRAMINE HCL 25 MG PO CAPS
ORAL_CAPSULE | ORAL | Status: AC
  Filled 2019-01-31: qty 2

## 2019-01-31 MED ORDER — ACETAMINOPHEN 325 MG PO TABS
ORAL_TABLET | ORAL | Status: AC
  Filled 2019-01-31: qty 2

## 2019-01-31 MED ORDER — HEPARIN SOD (PORK) LOCK FLUSH 100 UNIT/ML IV SOLN
500.0000 [IU] | Freq: Once | INTRAVENOUS | Status: AC | PRN
  Administered 2019-01-31: 17:00:00 500 [IU]
  Filled 2019-01-31: qty 5

## 2019-01-31 NOTE — Progress Notes (Signed)
[facility] Follow up:    Destiny Frees, MD [place] [id number]   DIAGNOSIS: Cancer Staging Malignant neoplasm of upper-outer quadrant of left breast in female, estrogen receptor positive ([place]) Staging form: Breast, AJCC 8th Edition - Clinical stage from 07/25/2018: Stage IB (cT2, cN0, cM0, G2, ER+, PR+, HER2+) - Signed by Nicholas Lose, MD on 07/25/2018 - Pathologic stage from 12/10/2018: No Stage Recommended (ypT1c, pN0, cM0, G2, ER+, PR+, HER2-) - Signed by Gardenia Phlegm, NP on 12/18/2018   SUMMARY OF ONCOLOGIC HISTORY: Oncology History  Malignant neoplasm of upper-outer quadrant of left breast in female, estrogen receptor positive ([place])  07/09/2018 Initial Diagnosis   Screening mammogram detected spiculated mass in the left breast upper outer quadrant, 2.6 cm by MRI, axilla negative, biopsy revealed grade 2 IDC with DCIS, ER 100%, PR 70%, Ki-67 10%, HER-2 3+ positive, T2N0 stage Ib clinical stage   07/25/2018 Cancer Staging   Staging form: Breast, AJCC 8th Edition - Clinical stage from 07/25/2018: Stage IB (cT2, cN0, cM0, G2, ER+, PR+, HER2+) - Signed by Nicholas Lose, MD on 07/25/2018   08/02/2018 -  Neo-Adjuvant Chemotherapy   Neoadjuvant chemotherapy with Taxol weekly with Herceptin and Perjeta every 3 weeks   10/13/2018 Genetic Testing   Negative genetic testing on the multicancer panel.  The Multi-Gene Panel offered by [facility] includes sequencing and/or deletion duplication testing of the following 85 genes: AIP, ALK, APC, ATM, AXIN2,BAP1,  BARD1, BLM, BMPR1A, BRCA1, BRCA2, BRIP1, CASR, CDC73, CDH1, CDK4, CDKN1B, CDKN1C, CDKN2A (p14ARF), CDKN2A (p16INK4a), CEBPA, CHEK2, CTNNA1, DICER1, DIS3L2, EGFR (c.2369C>T, p.Thr790Met variant only), EPCAM (Deletion/duplication testing only), FH, FLCN, GATA2, GPC3, GREM1 (Promoter region deletion/duplication testing only), HOXB13 (c.251G>A, p.Gly84Glu), HRAS, KIT, MAX, MEN1, MET, MITF  (c.952G>A, p.Glu318Lys variant only), MLH1, MSH2, MSH3, MSH6, MUTYH, NBN, NF1, NF2, NTHL1, PALB2, PDGFRA, PHOX2B, PMS2, POLD1, POLE, POT1, PRKAR1A, PTCH1, PTEN, RAD50, RAD51C, RAD51D, RB1, RECQL4, RET, RNF43, RUNX1, SDHAF2, SDHA (sequence changes only), SDHB, SDHC, SDHD, SMAD4, SMARCA4, SMARCB1, SMARCE1, STK11, SUFU, TERC, TERT, TMEM127, TP53, TSC1, TSC2, VHL, WRN and WT1.  The report date is Oct 13, 2018.   12/10/2018 Surgery   Left lumpectomy ([facility]): IDC, 1.5cm, grade 2, HER-2 negative, ER+ 100%, PR+ 80%, Ki67 5%, clear margins, 2 lymph nodes negative for carcinoma.    12/10/2018 Cancer Staging   Staging form: Breast, AJCC 8th Edition - Pathologic stage from 12/10/2018: No Stage Recommended (ypT1c, pN0, cM0, G2, ER+, PR+, HER2-) - Signed by Gardenia Phlegm, NP on 12/18/2018   01/31/2019 -  Chemotherapy   The patient had ado-trastuzumab emtansine (KADCYLA) 460 mg in sodium chloride 0.9 % 250 mL chemo infusion, 3.6 mg/kg = 460 mg, Intravenous, Once, 0 of 11 cycles  for chemotherapy treatment.      CURRENT THERAPY: Kadcyla  INTERVAL HISTORY: Destiny Nash 73 y.o. female returns for evaluation prior to starting [place].  Her start was delayed until today due to her increased fatigue.  She has some lower extremity swelling, chronic venous insufficiency, diabetes.  She was placed on bactrim DS for bilateral lower extremity cellulitis.  She is taking that BID and feels they are improving.  She notes she was also seen by Dr. Scot Dock in vascular surgery.  She is waiting on a flexitouch machine to help with the swelling.  She saw him on 8/5 and notes she hasn't heard anything about the status of this.  She notes that she is regaining strength, and is now walking  up the steps into her house without assistance.  She is taking lasix and she says that is helping her swelling as well.  She started radiation a couple of weeks ago.     Patient Active Problem List   Diagnosis Date Noted  . Hypoglycemia  10/20/2018  . Atrial fibrillation ([place]) 10/20/2018  . Type 2 diabetes mellitus ([place]) 10/20/2018  . Genetic testing 10/14/2018  . Family history of breast cancer   . Family history of uterine cancer   . Port-A-Cath in place 08/09/2018  . Malignant neoplasm of upper-outer quadrant of left breast in female, estrogen receptor positive ([place]) 07/25/2018  . Unspecified atrial fibrillation ([place]) 02/14/2018  . Cellulitis, leg 02/13/2018  . Uncontrolled type 2 diabetes mellitus with hyperglycemia, with long-term current use of insulin ([place]) 02/13/2018  . Essential hypertension 02/13/2018  . Hyperlipidemia 02/13/2018  . Obesity, Class III, BMI 40-49.9 (morbid obesity) ([place]) 02/13/2018    is allergic to levaquin [levofloxacin] and codeine.  MEDICAL HISTORY: Past Medical History:  Diagnosis Date  . Anemia    in the past   . Anxiety   . Atrial fibrillation ([place])    a. diagnosed in 02/2018.  . Bronchitis    easily  develops bronchitis when she has a cold  . Cancer ([place])    L breast - CA, Feb. 4th   . Cellulitis   . Chronic kidney disease   . Diabetes mellitus without complication ([place])    type 2  . Dyspnea   . Dysrhythmia    afib, treating with Eliquis  . Family history of breast cancer   . Family history of uterine cancer   . Heart murmur   . Hyperlipidemia   . Hypertension     SURGICAL HISTORY: Past Surgical History:  Procedure Laterality Date  . BREAST LUMPECTOMY WITH RADIOACTIVE SEED AND SENTINEL LYMPH NODE BIOPSY Left 12/10/2018   Procedure: LEFT BREAST LUMPECTOMY WITH RADIOACTIVE SEED AND LEFT SENTINEL LYMPH NODE MAPPING;  Surgeon: Erroll Luna, MD;  Location: [facility];  Service: General;  Laterality: Left;  . DILATION AND CURETTAGE OF UTERUS     x2 on same day  . PORTACATH PLACEMENT N/A 08/01/2018   Procedure: INSERTION PORT-A-CATH WITH ULTRASOUND;  Surgeon: Erroll Luna, MD;  Location: [facility];  Service: General;  Laterality: N/A;  . TONSILLECTOMY AND ADENOIDECTOMY       SOCIAL HISTORY: Social History   Socioeconomic History  . Marital status: Widowed    Spouse name: Not on file  . Number of children: Not on file  . Years of education: Not on file  . Highest education level: Not on file  Occupational History  . Not on file  Social Needs  . Financial resource strain: Not on file  . Food insecurity    Worry: Not on file    Inability: Not on file  . Transportation needs    Medical: No    Non-medical: No  Tobacco Use  . Smoking status: Never Smoker  . Smokeless tobacco: Never Used  Substance and Sexual Activity  . Alcohol use: Not Currently  . Drug use: Never  . Sexual activity: Not Currently  Lifestyle  . Physical activity    Days per week: Not on file    Minutes per session: Not on file  . Stress: Not on file  Relationships  . Social Herbalist on phone: Not on file    Gets together: Not on file    Attends religious service: Not on  file    Active member of club or organization: Not on file    Attends meetings of clubs or organizations: Not on file    Relationship status: Not on file  . Intimate partner violence    Fear of current or ex partner: No    Emotionally abused: No    Physically abused: No    Forced sexual activity: No  Other Topics Concern  . Not on file  Social History Narrative   Her husband passed away on 2018/06/24.   Religion: Christian.    FAMILY HISTORY: Family History  Problem Relation Age of Onset  . CAD Brother   . Hypertension Mother   . Hyperlipidemia Mother   . Hypertension Brother   . Uterine cancer Maternal Grandmother   . Breast cancer Cousin        dx in her 48s; mat first cousin  . Breast cancer Cousin        dx in her 34s-60s; mat first cousin    Review of Systems  Constitutional: Negative for appetite change, chills, fatigue, fever and unexpected weight change.  HENT:   Negative for hearing loss, lump/mass, sore throat and trouble swallowing.   Eyes: Negative for eye  problems and icterus.  Respiratory: Negative for chest tightness, cough and shortness of breath.   Cardiovascular: Negative for chest pain, leg swelling and palpitations.  Gastrointestinal: Negative for abdominal distention, abdominal pain, constipation, diarrhea, nausea and vomiting.  Endocrine: Negative for hot flashes.  Musculoskeletal: Negative for arthralgias.  Skin: Negative for itching and rash.  Neurological: Negative for dizziness, extremity weakness, headaches and numbness.  Hematological: Negative for adenopathy. Does not bruise/bleed easily.  Psychiatric/Behavioral: Negative for depression. The patient is not nervous/anxious.       PHYSICAL EXAMINATION  ECOG PERFORMANCE STATUS: 3 - Symptomatic, >50% confined to bed  Vitals:   01/31/19 1059  BP: 138/72  Pulse: 96  Resp: 17  Temp: 97.8 F (36.6 C)  SpO2: 100%    Physical Exam Constitutional:      General: She is not in acute distress.    Appearance: Normal appearance. She is not toxic-appearing.  HENT:     Head: Normocephalic and atraumatic.     Mouth/Throat:     Mouth: Mucous membranes are moist.     Pharynx: Oropharynx is clear. No oropharyngeal exudate or posterior oropharyngeal erythema.  Eyes:     General: No scleral icterus. Neck:     Musculoskeletal: Neck supple.  Cardiovascular:     Rate and Rhythm: Normal rate and regular rhythm.     Pulses: Normal pulses.     Heart sounds: Normal heart sounds.  Pulmonary:     Effort: Pulmonary effort is normal.     Breath sounds: Normal breath sounds.  Abdominal:     General: Abdomen is flat. There is no distension.     Palpations: Abdomen is soft. There is no mass.     Tenderness: There is no abdominal tenderness.  Musculoskeletal:        General: Swelling present.  Lymphadenopathy:     Cervical: No cervical adenopathy.  Skin:    General: Skin is warm and dry.     Capillary Refill: Capillary refill takes less than 2 seconds.     Findings: No rash.      Comments: Lower extremities slightly swollen, and erythematous and scaling.  No warmth noted.   Neurological:     General: No focal deficit present.     Mental Status: She is  alert.  Psychiatric:        Mood and Affect: Mood normal.        Behavior: Behavior normal.     LABORATORY DATA:  CBC    Component Value Date/Time   WBC 8.1 01/31/2019 1040   WBC 8.6 01/15/2019 1732   RBC 3.58 (L) 01/31/2019 1040   HGB 9.2 (L) 01/31/2019 1040   HCT 30.0 (L) 01/31/2019 1040   PLT 221 01/31/2019 1040   MCV 83.8 01/31/2019 1040   MCH 25.7 (L) 01/31/2019 1040   MCHC 30.7 01/31/2019 1040   RDW 15.9 (H) 01/31/2019 1040   LYMPHSABS 0.7 01/31/2019 1040   MONOABS 0.6 01/31/2019 1040   EOSABS 0.2 01/31/2019 1040   BASOSABS 0.0 01/31/2019 1040    CMP     Component Value Date/Time   NA 140 01/31/2019 1040   K 3.9 01/31/2019 1040   CL 98 01/31/2019 1040   CO2 32 01/31/2019 1040   GLUCOSE 143 (H) 01/31/2019 1040   BUN 22 01/31/2019 1040   CREATININE 0.83 01/31/2019 1040   CALCIUM 8.0 (L) 01/31/2019 1040   PROT 6.5 01/31/2019 1040   ALBUMIN 3.2 (L) 01/31/2019 1040   AST 11 (L) 01/31/2019 1040   ALT 6 01/31/2019 1040   ALKPHOS 110 01/31/2019 1040   BILITOT 0.3 01/31/2019 1040   GFRNONAA >60 01/31/2019 1040   GFRAA >60 01/31/2019 1040       PENDING LABS:   RADIOGRAPHIC STUDIES:  No results found.   PATHOLOGY:     ASSESSMENT and THERAPY PLAN:   Malignant neoplasm of upper-outer quadrant of left breast in female, estrogen receptor positive ([place]) 07/09/2018:Screening mammogram detected spiculated mass in the left breast upper outer quadrant, 2.6 cm by MRI, axilla negative, 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 Neoadjuvant chemotherapy withTaxol weekly x 8 Herceptin Perjeta (Perjeta discontinued due to diarrhea )every 3 weeksfollowed by  Kadcylamaintenance for 1 year 2.  Followed by breast conserving surgery if possible with sentinel lymph node study 12/10/2018 3. Followed by adjuvant radiation therapy  4.Followed by adjuvant antiestrogen therapy -------------------------------------------------------------------------------------------------------------------------------------  12/10/2018:Left lumpectomy ([facility]): IDC, 1.5cm, grade 2, HER-2 negative, ER+ 100%, PR+ 80%, Ki67 5%, clear margins, 2 lymph nodes negative for carcinoma.  8/18: adjuvant radiation started 8/28: Kadcyla   Treatment: Kadcyla Echocardiogram: 11/15/2018, EF greater than 38% Alice is feeling improved today. She has been regaining her strength slowly and this has been encouraging to her.  I recommended that she continue to work at it and progress.  She really would benefit from regaining her mobility.  We did call over to Dr. Nicole Cella office and ask that they follow up with [facility] about the status of the flexitouch compression device.   Alice and I reviewed Kadcyla, risks/benefits, and that it is indicated for her due to her residual breast cancer after surgery.  She understands this and is willing to proceed.  She understands she needs to have continued echocardiograms every 3 weeks.  I placed orders for her next one today.  [name] will continue on adjuvant radiation and will return in 3 weeks for labs, f/u with Dr. Lindi Adie, and her next treatment.     Orders Placed This Encounter  Procedures  . ECHOCARDIOGRAM COMPLETE    Standing Status:   Future    Standing Expiration Date:   05/02/2020    Order Specific Question:   Where should this test  be performed    Answer:   [place]    Order Specific Question:   Perflutren DEFINITY (image enhancing agent) should be administered unless hypersensitivity or allergy exist    Answer:   Administer Perflutren    Order Specific Question:   Reason for exam-Echo    Answer:   Chemotherapy evaluation  v87.41 / v58.11    All questions were answered. The  patient knows to call the clinic with any problems, questions or concerns. We can certainly see the patient much sooner if necessary.  A total of (30) minutes of face-to-face time was spent with this patient with greater than 50% of that time in counseling and care-coordination.  This note was electronically signed. Scot Dock, NP 01/31/2019

## 2019-01-31 NOTE — Assessment & Plan Note (Addendum)
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 Neoadjuvant chemotherapy withTaxol weekly x 8 Herceptin Perjeta (Perjeta discontinued due to diarrhea )every 3 weeksfollowed by  Kadcylamaintenance for 1 year 2. Followed by breast conserving surgery if possible with sentinel lymph node study 12/10/2018 3. Followed by adjuvant radiation therapy  4.Followed by adjuvant antiestrogen therapy -------------------------------------------------------------------------------------------------------------------------------------  12/10/2018:Left lumpectomy ([facility]): IDC, 1.5cm, grade 2, HER-2 negative, ER+ 100%, PR+ 80%, Ki67 5%, clear margins, 2 lymph nodes negative for carcinoma.  8/18: adjuvant radiation started 8/28: Kadcyla   Treatment: Kadcyla Echocardiogram: 11/15/2018, EF greater than 94% Destiny Nash is feeling improved today. She has been regaining her strength slowly and this has been encouraging to her.  I recommended that she continue to work at it and progress.  She really would benefit from regaining her mobility.  We did call over to Dr. Nicole Cella office and ask that they follow up with [facility] about the status of the flexitouch compression device.   Destiny Nash and I reviewed Kadcyla, risks/benefits, and that it is indicated for her due to her residual breast cancer after surgery.  She understands this and is willing to proceed.  She understands she needs to have continued echocardiograms every 3 weeks.  I placed orders for her next one today.  [name] will continue on adjuvant radiation and will return in 3 weeks for labs, f/u with Dr. Lindi Adie, and her next treatment.

## 2019-01-31 NOTE — Patient Instructions (Addendum)
[facility] Discharge Instructions for Patients Receiving Chemotherapy  Today you received the following chemotherapy agent: Ado-Trastuzumab Emtansine Illene Bolus).  To help prevent nausea and vomiting after your treatment, we encourage you to take your nausea medication.   If you develop nausea and vomiting that is not controlled by your nausea medication, call the clinic.   BELOW ARE SYMPTOMS THAT SHOULD BE REPORTED IMMEDIATELY:  *FEVER GREATER THAN 100.5 F  *CHILLS WITH OR WITHOUT FEVER  NAUSEA AND VOMITING THAT IS NOT CONTROLLED WITH YOUR NAUSEA MEDICATION  *UNUSUAL SHORTNESS OF BREATH  *UNUSUAL BRUISING OR BLEEDING  TENDERNESS IN MOUTH AND THROAT WITH OR WITHOUT PRESENCE OF ULCERS  *URINARY PROBLEMS  *BOWEL PROBLEMS  UNUSUAL RASH Items with * indicate a potential emergency and should be followed up as soon as possible.  Feel free to call the clinic should you have any questions or concerns. The clinic phone number is [phone].  Please show the [facility] at check-in to the Emergency Department and triage nurse.  Ado-Trastuzumab Emtansine for injection What is this medicine? ADO-TRASTUZUMAB EMTANSINE (ADD oh traz TOO zuh mab em TAN zine) is a monoclonal antibody combined with chemotherapy. It is used to treat breast cancer. This medicine may be used for other purposes; ask your health care provider or pharmacist if you have questions. COMMON BRAND NAME(S): Kadcyla What should I tell my health care provider before I take this medicine? They need to know if you have any of these conditions:  heart disease  heart failure  infection (especially a virus infection such as chickenpox, cold sores, or herpes)  liver disease  lung or breathing disease, like asthma  tingling of the fingers or toes, or other nerve disorder  an unusual or allergic reaction to ado-trastuzumab emtansine, other medications, foods, dyes, or preservatives  pregnant or  trying to get pregnant  breast-feeding How should I use this medicine? This medicine is for infusion into a vein. It is given by a health care professional in a hospital or clinic setting. Talk to your pediatrician regarding the use of this medicine in children. Special care may be needed. Overdosage: If you think you have taken too much of this medicine contact a poison control center or emergency room at once. NOTE: This medicine is only for you. Do not share this medicine with others. What if I miss a dose? It is important not to miss your dose. Call your doctor or health care professional if you are unable to keep an appointment. What may interact with this medicine? This medicine may also interact with the following medications:  atazanavir  boceprevir  clarithromycin  delavirdine  indinavir  dalfopristin; quinupristin  isoniazid, INH  itraconazole  ketoconazole  nefazodone  nelfinavir  ritonavir  telaprevir  telithromycin  tipranavir  voriconazole This list may not describe all possible interactions. Give your health care provider a list of all the medicines, herbs, non-prescription drugs, or dietary supplements you use. Also tell them if you smoke, drink alcohol, or use illegal drugs. Some items may interact with your medicine. What should I watch for while using this medicine? Visit your doctor for checks on your progress. This drug may make you feel generally unwell. This is not uncommon, as chemotherapy can affect healthy cells as well as cancer cells. Report any side effects. Continue your course of treatment even though you feel ill unless your doctor tells you to stop. You may need blood work done while you are taking this medicine.  Call your doctor or health care professional for advice if you get a fever, chills or sore throat, or other symptoms of a cold or flu. Do not treat yourself. This drug decreases your body's ability to fight infections. Try to  avoid being around people who are sick. Be careful brushing and flossing your teeth or using a toothpick because you may get an infection or bleed more easily. If you have any dental work done, tell your dentist you are receiving this medicine. Avoid taking products that contain aspirin, acetaminophen, ibuprofen, naproxen, or ketoprofen unless instructed by your doctor. These medicines may hide a fever. Do not become pregnant while taking this medicine or for 7 months after stopping it, men with female partners should use contraception during treatment and for 4 months after the last dose. Women should inform their doctor if they wish to become pregnant or think they might be pregnant. There is a potential for serious side effects to an unborn child. Do not breast-feed an infant while taking this medicine or for 7 months after the last dose. Men who have a partner who is pregnant or who is capable of becoming pregnant should use a condom during sexual activity while taking this medicine and for 4 months after stopping it. Men should inform their doctors if they wish to father a child. This medicine may lower sperm counts. Talk to your health care professional or pharmacist for more information. What side effects may I notice from receiving this medicine? Side effects that you should report to your doctor or health care professional as soon as possible:  allergic reactions like skin rash, itching or hives, swelling of the face, lips, or tongue  breathing problems  chest pain or palpitations  fever or chills, sore throat  general ill feeling or flu-like symptoms  light-colored stools  nausea, vomiting  pain, tingling, numbness in the hands or feet  signs and symptoms of bleeding such as bloody or black, tarry stools; red or dark-brown urine; spitting up blood or brown material that looks like coffee grounds; red spots on the skin; unusual bruising or bleeding from the eye, gums, or  nose  swelling of the legs or ankles  yellowing of the eyes or skin Side effects that usually do not require medical attention (report to your doctor or health care professional if they continue or are bothersome):  changes in taste  constipation  dizziness  headache  joint pain  muscle pain  trouble sleeping  unusually weak or tired This list may not describe all possible side effects. Call your doctor for medical advice about side effects. You may report side effects to FDA at 1-800-FDA-1088. Where should I keep my medicine? This drug is given in a hospital or clinic and will not be stored at home. NOTE: This sheet is a summary. It may not cover all possible information. If you have questions about this medicine, talk to your doctor, pharmacist, or health care provider.  2020 Elsevier/Gold Standard (2017-10-19 10:03:15)  Coronavirus (COVID-19) Are you at risk?  Are you at risk for the Coronavirus (COVID-19)?  To be considered HIGH RISK for Coronavirus (COVID-19), you have to meet the following criteria:  . Traveled to [place], [place], [place], [place] or [place]; or in the [place] to [place], [place], [place], or [place]; and have fever, cough, and shortness of breath within the last 2 weeks of travel OR . Been in close contact with a person diagnosed with COVID-19 within  the last 2 weeks and have fever, cough, and shortness of breath . IF YOU DO NOT MEET THESE CRITERIA, YOU ARE CONSIDERED LOW RISK FOR COVID-19.  What to do if you are HIGH RISK for COVID-19?  Marland Kitchen If you are having a medical emergency, call 911. . Seek medical care right away. Before you go to a doctor's office, urgent care or emergency department, call ahead and tell them about your recent travel, contact with someone diagnosed with COVID-19, and your symptoms. You should receive instructions from your physician's office regarding next steps of care.  . When you arrive at healthcare provider,  tell the healthcare staff immediately you have returned from visiting [place], [place], [place], [place] or [place]; or traveled in the [place] to [place], [place], [place], or [place]; in the last two weeks or you have been in close contact with a person diagnosed with COVID-19 in the last 2 weeks.   . Tell the health care staff about your symptoms: fever, cough and shortness of breath. . After you have been seen by a medical provider, you will be either: o Tested for (COVID-19) and discharged home on quarantine except to seek medical care if symptoms worsen, and asked to  - Stay home and avoid contact with others until you get your results (4-5 days)  - Avoid travel on public transportation if possible (such as bus, train, or airplane) or o Sent to the Emergency Department by EMS for evaluation, COVID-19 testing, and possible admission depending on your condition and test results.  What to do if you are LOW RISK for COVID-19?  Reduce your risk of any infection by using the same precautions used for avoiding the common cold or flu:  Marland Kitchen Wash your hands often with soap and warm water for at least 20 seconds.  If soap and water are not readily available, use an alcohol-based hand sanitizer with at least 60% alcohol.  . If coughing or sneezing, cover your mouth and nose by coughing or sneezing into the elbow areas of your shirt or coat, into a tissue or into your sleeve (not your hands). . Avoid shaking hands with others and consider head nods or verbal greetings only. . Avoid touching your eyes, nose, or mouth with unwashed hands.  . Avoid close contact with people who are sick. . Avoid places or events with large numbers of people in one location, like concerts or sporting events. . Carefully consider travel plans you have or are making. . If you are planning any travel outside or inside the [place], visit the CDC's Travelers' Health webpage for the latest health notices. . If you have some  symptoms but not all symptoms, continue to monitor at home and seek medical attention if your symptoms worsen. . If you are having a medical emergency, call 911.   [place] / e-Visit: [email or web address]         [facility]: [facility]: [phone]                   [facility]: [phone]

## 2019-02-02 ENCOUNTER — Encounter: Payer: Self-pay | Admitting: Hematology and Oncology

## 2019-02-03 ENCOUNTER — Other Ambulatory Visit: Payer: Self-pay

## 2019-02-03 ENCOUNTER — Encounter: Payer: Self-pay | Admitting: Adult Health

## 2019-02-03 ENCOUNTER — Ambulatory Visit: Admission: RE | Admit: 2019-02-03 | Payer: Medicare Other | Source: Ambulatory Visit

## 2019-02-03 ENCOUNTER — Ambulatory Visit: Payer: Medicare Other | Admitting: Radiation Oncology

## 2019-02-03 MED ORDER — DOXYCYCLINE HYCLATE 100 MG PO TABS: 100.0000 mg | ORAL_TABLET | Freq: Two times a day (BID) | ORAL | 0 refills | Status: DC

## 2019-02-04 ENCOUNTER — Ambulatory Visit
Admission: RE | Admit: 2019-02-04 | Discharge: 2019-02-04 | Disposition: A | Discharge status: Home / Self Care | Payer: Medicare Other | Source: Ambulatory Visit | Attending: Radiation Oncology | Admitting: Radiation Oncology

## 2019-02-04 ENCOUNTER — Other Ambulatory Visit: Payer: Self-pay

## 2019-02-04 ENCOUNTER — Ambulatory Visit: Payer: Medicare Other

## 2019-02-04 DIAGNOSIS — C50412 Malignant neoplasm of upper-outer quadrant of left female breast: Secondary | ICD-10-CM | POA: Insufficient documentation

## 2019-02-04 DIAGNOSIS — Z510 Encounter for antineoplastic radiation therapy: Secondary | ICD-10-CM | POA: Insufficient documentation

## 2019-02-04 DIAGNOSIS — Z170 Estrogen receptor positive status [ER+]: Secondary | ICD-10-CM | POA: Diagnosis not present

## 2019-02-05 ENCOUNTER — Other Ambulatory Visit: Payer: Self-pay

## 2019-02-05 ENCOUNTER — Ambulatory Visit
Admission: RE | Admit: 2019-02-05 | Discharge: 2019-02-05 | Disposition: A | Discharge status: Home / Self Care | Payer: Medicare Other | Source: Ambulatory Visit | Attending: Radiation Oncology | Admitting: Radiation Oncology

## 2019-02-05 DIAGNOSIS — C50412 Malignant neoplasm of upper-outer quadrant of left female breast: Secondary | ICD-10-CM | POA: Diagnosis not present

## 2019-02-05 DIAGNOSIS — Z510 Encounter for antineoplastic radiation therapy: Secondary | ICD-10-CM | POA: Diagnosis not present

## 2019-02-05 DIAGNOSIS — Z170 Estrogen receptor positive status [ER+]: Secondary | ICD-10-CM | POA: Diagnosis not present

## 2019-02-06 ENCOUNTER — Ambulatory Visit
Admission: RE | Admit: 2019-02-06 | Discharge: 2019-02-06 | Disposition: A | Discharge status: Home / Self Care | Payer: Medicare Other | Source: Ambulatory Visit | Attending: Radiation Oncology | Admitting: Radiation Oncology

## 2019-02-06 ENCOUNTER — Other Ambulatory Visit: Payer: Self-pay

## 2019-02-06 DIAGNOSIS — C50412 Malignant neoplasm of upper-outer quadrant of left female breast: Secondary | ICD-10-CM | POA: Diagnosis not present

## 2019-02-06 DIAGNOSIS — Z170 Estrogen receptor positive status [ER+]: Secondary | ICD-10-CM | POA: Diagnosis not present

## 2019-02-06 DIAGNOSIS — Z510 Encounter for antineoplastic radiation therapy: Secondary | ICD-10-CM | POA: Diagnosis not present

## 2019-02-07 ENCOUNTER — Other Ambulatory Visit: Payer: Self-pay

## 2019-02-07 ENCOUNTER — Ambulatory Visit
Admission: RE | Admit: 2019-02-07 | Discharge: 2019-02-07 | Disposition: A | Discharge status: Home / Self Care | Payer: Medicare Other | Source: Ambulatory Visit | Attending: Radiation Oncology | Admitting: Radiation Oncology

## 2019-02-07 DIAGNOSIS — C50412 Malignant neoplasm of upper-outer quadrant of left female breast: Secondary | ICD-10-CM | POA: Diagnosis not present

## 2019-02-07 DIAGNOSIS — Z170 Estrogen receptor positive status [ER+]: Secondary | ICD-10-CM | POA: Diagnosis not present

## 2019-02-07 DIAGNOSIS — Z510 Encounter for antineoplastic radiation therapy: Secondary | ICD-10-CM | POA: Diagnosis not present

## 2019-02-11 ENCOUNTER — Ambulatory Visit
Admission: RE | Admit: 2019-02-11 | Discharge: 2019-02-11 | Disposition: A | Discharge status: Home / Self Care | Payer: Medicare Other | Source: Ambulatory Visit | Attending: Radiation Oncology | Admitting: Radiation Oncology

## 2019-02-11 ENCOUNTER — Ambulatory Visit (HOSPITAL_COMMUNITY)
Admission: RE | Admit: 2019-02-11 | Discharge: 2019-02-11 | Disposition: A | Discharge status: Home / Self Care | Payer: Medicare Other | Source: Ambulatory Visit | Attending: Adult Health | Admitting: Adult Health

## 2019-02-11 ENCOUNTER — Ambulatory Visit: Payer: Medicare Other

## 2019-02-11 ENCOUNTER — Other Ambulatory Visit: Payer: Self-pay

## 2019-02-11 DIAGNOSIS — I313 Pericardial effusion (noninflammatory): Secondary | ICD-10-CM | POA: Insufficient documentation

## 2019-02-11 DIAGNOSIS — Z510 Encounter for antineoplastic radiation therapy: Secondary | ICD-10-CM | POA: Diagnosis not present

## 2019-02-11 DIAGNOSIS — C50412 Malignant neoplasm of upper-outer quadrant of left female breast: Secondary | ICD-10-CM | POA: Diagnosis not present

## 2019-02-11 DIAGNOSIS — Z170 Estrogen receptor positive status [ER+]: Secondary | ICD-10-CM | POA: Diagnosis not present

## 2019-02-11 DIAGNOSIS — I4892 Unspecified atrial flutter: Secondary | ICD-10-CM | POA: Insufficient documentation

## 2019-02-11 DIAGNOSIS — I4891 Unspecified atrial fibrillation: Secondary | ICD-10-CM | POA: Diagnosis not present

## 2019-02-11 NOTE — Progress Notes (Signed)
  Echocardiogram 2D Echocardiogram has been performed.  Destiny Nash 02/11/2019, 2:02 PM

## 2019-02-12 ENCOUNTER — Ambulatory Visit
Admission: RE | Admit: 2019-02-12 | Discharge: 2019-02-12 | Disposition: A | Discharge status: Home / Self Care | Payer: Medicare Other | Source: Ambulatory Visit | Attending: Radiation Oncology | Admitting: Radiation Oncology

## 2019-02-12 ENCOUNTER — Ambulatory Visit: Payer: Medicare Other

## 2019-02-12 ENCOUNTER — Other Ambulatory Visit: Payer: Self-pay

## 2019-02-12 ENCOUNTER — Encounter: Payer: Self-pay | Admitting: Cardiology

## 2019-02-12 ENCOUNTER — Ambulatory Visit (INDEPENDENT_AMBULATORY_CARE_PROVIDER_SITE_OTHER): Payer: Medicare Other | Admitting: Cardiology

## 2019-02-12 ENCOUNTER — Other Ambulatory Visit: Payer: Self-pay | Admitting: Hematology and Oncology

## 2019-02-12 VITALS — BP 130/80 | HR 68 | Temp 97.8°F | Ht 66.0 in

## 2019-02-12 DIAGNOSIS — Z170 Estrogen receptor positive status [ER+]: Secondary | ICD-10-CM | POA: Diagnosis not present

## 2019-02-12 DIAGNOSIS — C8595 Non-Hodgkin lymphoma, unspecified, lymph nodes of inguinal region and lower limb: Secondary | ICD-10-CM | POA: Diagnosis not present

## 2019-02-12 DIAGNOSIS — Z510 Encounter for antineoplastic radiation therapy: Secondary | ICD-10-CM | POA: Diagnosis not present

## 2019-02-12 DIAGNOSIS — C50412 Malignant neoplasm of upper-outer quadrant of left female breast: Secondary | ICD-10-CM | POA: Diagnosis not present

## 2019-02-12 DIAGNOSIS — Z79899 Other long term (current) drug therapy: Secondary | ICD-10-CM | POA: Diagnosis not present

## 2019-02-12 MED ORDER — FUROSEMIDE 40 MG PO TABS: 60.0000 mg | ORAL_TABLET | Freq: Every day | ORAL | 3 refills | Status: DC

## 2019-02-12 NOTE — Progress Notes (Signed)
Clinical Summary Ms. Achee is a 73 y.o.female seen today for follow up of the following medical problems.   1. Afib - new diagnosis during 02/2018 admission - resistant to cardiversion attempt   -denies any significant palpitations. Tolerating anticoag without issues.     2. Pulmonary HTN - 02/2018 echo PASP 65. Biatrial enlargement would suggests some diasotlic dysfunction though it was indeterminant by the study, LVEF was 55-60%. - 07/2018 echo PASP not reported, a reported RVSP is 28 (TR peak frad 13, RA pressure 15), unclear accuracy as this is reported only in the body of the report - ANA neg, HIV neg, LFTs normal   - needs sleep study, PFTs - she was resistant to sleep study at last visit  - wants to hold off on sleep study or any further evaluation until after her breast cancer treatment.   3. Breast cancer - needs echo every 3 months as requested by her oncologist. Last echo 07/2018 LVEF 60-65%, normal RV function,  - she is on herceptic, taxol, pertuzumab   4. HTN - remains compliant with meds   5. LE edema/Lymphedema/Acute on chronic Diastolic HF/Pleural effusions - seen by vascular, thought to be lymphedema and venous insufficiency as a major contributor to her LE edema - imaging however has also showed signs of pulmonary edema and pleural effusion consistent with diastolic HF though fairly benign BNP last month, though can be misleading in diastolic HF and obesity      - during 8/18 visit with PA Strader lasix was increased to 27m daily. Was also to have home health Unna boots placed.  - repeat BMET showed Cr actually improved from last month.  02/2019 echo LVEF 60-65%, indeterminate diastolic function. Normal RV function. Dilated IVC, PASP is 38 mmhg.Reported GLS 13.5% up from prior study   - takes at least 439mdaily, at times will take 6080m  - reports swelling in legs is improving - breathing is improving.     SH: husband fairly  recently passed away    Past Medical History:  Diagnosis Date  . Anemia    in the past   . Anxiety   . [place]rial [place] ([place]  [place]. diagnosed in 02/2018.  . Bronchitis    easily  develops bronchitis when she has a cold  . [place] breast - [place], Feb. 4th   . Cellulitis   . Chronic kidney disease   . Diabetes mellitus without complication ([place]  type 2  . Dyspnea   . Dysrhythmia    afib, treating with Eliquis  . Family history of breast cancer   . Family history of uterine cancer   . Heart murmur   . Hyperlipidemia   . Hypertension      Allergies  Allergen Reactions  . Levaquin [Levofloxacin] Other (See Comments)    Joint "seize" and constipation  . Codeine Other (See Comments)    Severe headache     Current Outpatient Medications  Medication Sig Dispense Refill  . acetaminophen (TYLENOL) 500 MG tablet Take 1,000 mg by mouth every 6 (six) hours as needed for moderate pain or headache.    . albuterol (PROVENTIL HFA;VENTOLIN HFA) 108 (90 Base) MCG/ACT inhaler Inhale 2 puffs into the lungs every 6 (six) hours as needed for wheezing or shortness of breath.    . aMarland Kitchenixaban (ELIQUIS) 5 MG TABS tablet Take 1 tablet (5 mg total) by mouth 2 (two) times daily. 60[place]  tablet 0  . cholestyramine (QUESTRAN) 4 g packet Take 1 packet (4 g total) by mouth 3 (three) times daily with meals. (Patient taking differently: Take 4 g by mouth daily. ) 60 each 12  . CINNAMON PO Take 1 tablet by mouth daily.    Marland Kitchen diltiazem (CARDIZEM CD) 300 MG 24 hr capsule Take 1 capsule (300 mg total) by mouth daily. 90 capsule 3  . diphenoxylate-atropine (LOMOTIL) 2.5-0.025 MG tablet Take 1 tablet by mouth 4 (four) times daily as needed for diarrhea or loose stools. 120 tablet 3  . doxycycline (VIBRA-TABS) 100 MG tablet Take 1 tablet (100 mg total) by mouth 2 (two) times daily. 60 tablet 0  . furosemide (LASIX) 40 MG tablet Take 1 tablet (40 mg total) by mouth daily. 90 tablet 3  . insulin detemir  (LEVEMIR) 100 UNIT/ML injection Inject 0.11 mLs (11 Units total) into the skin at bedtime. (Patient taking differently: Inject 8 Units into the skin at bedtime. )  0  . LORazepam (ATIVAN) 1 MG tablet Take 1 tablet 30 min before radiotherapy, PRN anxiety and claustrophobia. 20 tablet 0  . metFORMIN (GLUCOPHAGE) 500 MG tablet Take 1,000 mg by mouth 2 (two) times daily with a meal.     . niacin 500 MG tablet Take 500 mg by mouth daily.    . NYSTATIN powder Apply 1 Bottle topically daily as needed (irritation).     . Omega-3 1000 MG CAPS Take 1,000 mg by mouth 2 (two) times daily.    . pantoprazole (PROTONIX) 40 MG tablet Take 1 tablet (40 mg total) by mouth daily. 30 tablet 3  . potassium chloride SA (K-DUR) 20 MEQ tablet Take 1 tablet (20 mEq total) by mouth 2 (two) times daily. 60 tablet 0  . pravastatin (PRAVACHOL) 20 MG tablet Take 20 mg by mouth daily before breakfast.   0  . sulfamethoxazole-trimethoprim (BACTRIM DS) 800-160 MG tablet Take 1 tablet by mouth 2 (two) times daily. 60 tablet 0  . traMADol (ULTRAM) 50 MG tablet Take 1 tablet (50 mg total) by mouth every 6 (six) hours as needed. 20 tablet 0   No current facility-administered medications for this visit.      Past Surgical History:  Procedure Laterality Date  . BREAST LUMPECTOMY WITH RADIOACTIVE SEED AND SENTINEL LYMPH NODE BIOPSY Left 12/10/2018   Procedure: LEFT BREAST LUMPECTOMY WITH RADIOACTIVE SEED AND LEFT SENTINEL LYMPH NODE MAPPING;  Surgeon: Erroll Luna, MD;  Location: [facility];  Service: General;  Laterality: Left;  . DILATION AND CURETTAGE OF UTERUS     x2 on same day  . PORTACATH PLACEMENT N/A 08/01/2018   Procedure: INSERTION PORT-A-CATH WITH ULTRASOUND;  Surgeon: Erroll Luna, MD;  Location: [facility];  Service: General;  Laterality: N/A;  . TONSILLECTOMY AND ADENOIDECTOMY       Allergies  Allergen Reactions  . Levaquin [Levofloxacin] Other (See Comments)    Joint "seize" and constipation  . Codeine Other (See  Comments)    Severe headache      Family History  Problem Relation Age of Onset  . CAD Brother   . Hypertension Mother   . Hyperlipidemia Mother   . Hypertension Brother   . Uterine cancer Maternal Grandmother   . Breast cancer Cousin        dx in her 60s; mat first cousin  . Breast cancer Cousin        dx in her 51s-60s; mat first cousin     Social History [name]  reports that she has never smoked. She has never used smokeless tobacco. Ms. Azzarello reports previous alcohol use.   Review of Systems CONSTITUTIONAL: No weight loss, fever, chills, weakness or fatigue.  HEENT: Eyes: No visual loss, blurred vision, double vision or yellow sclerae.No hearing loss, sneezing, congestion, runny nose or sore throat.  SKIN: No rash or itching.  CARDIOVASCULAR: per hpi RESPIRATORY: per hpi GASTROINTESTINAL: No anorexia, nausea, vomiting or diarrhea. No abdominal pain or blood.  GENITOURINARY: No burning on urination, no polyuria NEUROLOGICAL: No headache, dizziness, syncope, paralysis, ataxia, numbness or tingling in the extremities. No change in bowel or bladder control.  MUSCULOSKELETAL: No muscle, back pain, joint pain or stiffness.  LYMPHATICS: No enlarged nodes. No history of splenectomy.  PSYCHIATRIC: No history of depression or anxiety.  ENDOCRINOLOGIC: No reports of sweating, cold or heat intolerance. No polyuria or polydipsia.  Marland Kitchen   Physical Examination Today's Vitals   02/12/19 1404  BP: 130/80  Pulse: 68  Temp: 97.8 F (36.6 C)  SpO2: 97%  Height: 5' 6"  (1.676 m)   Body mass index is 44.71 kg/m.  Gen: resting comfortably, no acute distress HEENT: no scleral icterus, pupils equal round and reactive, no palptable cervical adenopathy,  CV: irreg, no m/r/g, no jvd Resp: Clear to auscultation bilaterally GI: abdomen is soft, non-tender, non-distended, normal bowel sounds, no hepatosplenomegaly MSK: extremities are warm,2+ nonpitting bilateral edema Skin: warm, no  rash Neuro:  no focal deficits Psych: appropriate affect     Assessment and Plan  1. Afib - no symptoms, continue current meds   2. Acute on chronic diastolic HF - symptoms improving slowly since increasing lasix to 57m daily. Difficult to assess her progress as she is not able to stand for a weight, has severe chronic LE edema related to lymphedema and venous insufficiency. Symptomatically symptoms are improving and subjectively she feels swellign is improving some - increase lasix to 657mdaily, repeat BMET/Mg in 2 weeks.   3. HTN - at goal, continue current meds  4. Lymphedema - per vascular, at last PA visit referal was placed for home health unna boot but does not appear family was contacted, I have asked nursing to look into this  F/u 6 weeks to reassess volume status.   JoArnoldo LenisM.D.

## 2019-02-12 NOTE — Addendum Note (Signed)
Addended by: Barbarann Ehlers A on: 02/12/2019 03:32 PM   Modules accepted: Orders

## 2019-02-12 NOTE — Patient Instructions (Signed)
Medication Instructions: INCREASE Lasix to 60 mg daily  Labwork: Bmet, magnesium in 2 weeks (9/24)  Procedures/Testing: None today  Follow-Up: Virtual Visit with Dr.Branch in 6 weeks  Any Additional Special Instructions Will Be Listed Below (If Applicable).     If you need a refill on your cardiac medications before your next appointment, please call your pharmacy.     Thank you for choosing [facility] !

## 2019-02-13 ENCOUNTER — Ambulatory Visit
Admission: RE | Admit: 2019-02-13 | Discharge: 2019-02-13 | Disposition: A | Discharge status: Home / Self Care | Payer: Medicare Other | Source: Ambulatory Visit | Attending: Radiation Oncology | Admitting: Radiation Oncology

## 2019-02-13 ENCOUNTER — Other Ambulatory Visit: Payer: Self-pay

## 2019-02-13 ENCOUNTER — Ambulatory Visit: Payer: Medicare Other

## 2019-02-13 DIAGNOSIS — Z510 Encounter for antineoplastic radiation therapy: Secondary | ICD-10-CM | POA: Diagnosis not present

## 2019-02-13 DIAGNOSIS — C50412 Malignant neoplasm of upper-outer quadrant of left female breast: Secondary | ICD-10-CM | POA: Diagnosis not present

## 2019-02-13 DIAGNOSIS — Z170 Estrogen receptor positive status [ER+]: Secondary | ICD-10-CM | POA: Diagnosis not present

## 2019-02-14 ENCOUNTER — Ambulatory Visit
Admission: RE | Admit: 2019-02-14 | Discharge: 2019-02-14 | Disposition: A | Discharge status: Home / Self Care | Payer: Medicare Other | Source: Ambulatory Visit | Attending: Radiation Oncology | Admitting: Radiation Oncology

## 2019-02-14 ENCOUNTER — Ambulatory Visit: Payer: Medicare Other

## 2019-02-14 ENCOUNTER — Other Ambulatory Visit: Payer: Self-pay

## 2019-02-14 DIAGNOSIS — C50412 Malignant neoplasm of upper-outer quadrant of left female breast: Secondary | ICD-10-CM | POA: Diagnosis not present

## 2019-02-14 DIAGNOSIS — Z170 Estrogen receptor positive status [ER+]: Secondary | ICD-10-CM | POA: Diagnosis not present

## 2019-02-14 DIAGNOSIS — Z510 Encounter for antineoplastic radiation therapy: Secondary | ICD-10-CM | POA: Diagnosis not present

## 2019-02-17 ENCOUNTER — Ambulatory Visit
Admission: RE | Admit: 2019-02-17 | Discharge: 2019-02-17 | Disposition: A | Discharge status: Home / Self Care | Payer: Medicare Other | Source: Ambulatory Visit | Attending: Radiation Oncology | Admitting: Radiation Oncology

## 2019-02-17 ENCOUNTER — Ambulatory Visit: Payer: Medicare Other

## 2019-02-17 DIAGNOSIS — C50412 Malignant neoplasm of upper-outer quadrant of left female breast: Secondary | ICD-10-CM | POA: Diagnosis not present

## 2019-02-17 DIAGNOSIS — Z170 Estrogen receptor positive status [ER+]: Secondary | ICD-10-CM | POA: Diagnosis not present

## 2019-02-17 DIAGNOSIS — Z510 Encounter for antineoplastic radiation therapy: Secondary | ICD-10-CM | POA: Diagnosis not present

## 2019-02-18 ENCOUNTER — Ambulatory Visit (INDEPENDENT_AMBULATORY_CARE_PROVIDER_SITE_OTHER): Payer: Medicare Other | Admitting: Internal Medicine

## 2019-02-18 ENCOUNTER — Encounter: Payer: Self-pay | Admitting: Internal Medicine

## 2019-02-18 ENCOUNTER — Encounter: Payer: Self-pay | Admitting: *Deleted

## 2019-02-18 ENCOUNTER — Ambulatory Visit
Admission: RE | Admit: 2019-02-18 | Discharge: 2019-02-18 | Disposition: A | Discharge status: Home / Self Care | Payer: Medicare Other | Source: Ambulatory Visit | Attending: Radiation Oncology | Admitting: Radiation Oncology

## 2019-02-18 ENCOUNTER — Other Ambulatory Visit: Payer: Self-pay

## 2019-02-18 VITALS — BP 151/83 | HR 105 | Temp 98.7°F

## 2019-02-18 DIAGNOSIS — I890 Lymphedema, not elsewhere classified: Secondary | ICD-10-CM

## 2019-02-18 DIAGNOSIS — Z170 Estrogen receptor positive status [ER+]: Secondary | ICD-10-CM | POA: Diagnosis not present

## 2019-02-18 DIAGNOSIS — Z510 Encounter for antineoplastic radiation therapy: Secondary | ICD-10-CM | POA: Diagnosis not present

## 2019-02-18 DIAGNOSIS — Z872 Personal history of diseases of the skin and subcutaneous tissue: Secondary | ICD-10-CM

## 2019-02-18 DIAGNOSIS — C50412 Malignant neoplasm of upper-outer quadrant of left female breast: Secondary | ICD-10-CM | POA: Diagnosis not present

## 2019-02-18 NOTE — Progress Notes (Signed)
RFV: recurrent cellulitis  Patient ID: Destiny Nash, female   DOB: 1945-11-20, 73 y.o.   MRN: [id number]  HPI  Destiny Nash is a 86YK F who has had recurrent cellulitis, treatment of such to her right lower leg, though has been concurrently being treated for breast ca. 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 Currently her legs are not necessarily worse, actually improved but she feels that may have still some erythema to right leg  On a month of bactrim - then now back on doxy - 6 wks+ abtx Outpatient Encounter Medications as of 02/18/2019  Medication Sig  . acetaminophen (TYLENOL) 500 MG tablet Take 1,000 mg by mouth every 6 (six) hours as needed for moderate pain or headache.  . albuterol (PROVENTIL HFA;VENTOLIN HFA) 108 (90 Base) MCG/ACT inhaler Inhale 2 puffs into the lungs every 6 (six) hours as needed for wheezing or shortness of breath.  Marland Kitchen apixaban (ELIQUIS) 5 MG TABS tablet Take 1 tablet (5 mg total) by mouth 2 (two) times daily.  . cholestyramine (QUESTRAN) 4 g packet Take 1 packet (4 g total) by mouth 3 (three) times daily with meals. (Patient taking differently: Take 4 g by mouth daily as needed. )  . CINNAMON PO Take 1 tablet by mouth daily.  Marland Kitchen diltiazem (CARDIZEM CD) 300 MG 24 hr capsule Take 1 capsule (300 mg total) by mouth daily.  . diphenoxylate-atropine (LOMOTIL) 2.5-0.025 MG tablet Take 1 tablet by mouth 4 (four) times daily as needed for diarrhea or loose stools.  Marland Kitchen doxycycline (VIBRA-TABS) 100 MG tablet Take 1 tablet (100 mg total) by mouth 2 (two) times daily.  . furosemide (LASIX) 40 MG tablet Take 1.5 tablets (60 mg total) by mouth daily.  . insulin detemir (LEVEMIR) 100 UNIT/ML injection Inject 0.11 mLs (11 Units total) into the skin at bedtime. (Patient taking differently: Inject 8 Units into the skin at bedtime. )  . LORazepam (ATIVAN) 1 MG tablet Take 1 tablet 30 min before radiotherapy, PRN anxiety and claustrophobia.  . metFORMIN  (GLUCOPHAGE) 500 MG tablet Take 1,000 mg by mouth 2 (two) times daily with a meal.   . niacin 500 MG tablet Take 500 mg by mouth daily.  . NYSTATIN powder Apply 1 Bottle topically daily as needed (irritation).   . Omega-3 1000 MG CAPS Take 1,000 mg by mouth 2 (two) times daily.  . pantoprazole (PROTONIX) 40 MG tablet Take 1 tablet (40 mg total) by mouth daily.  . pravastatin (PRAVACHOL) 20 MG tablet Take 20 mg by mouth daily before breakfast.   . traMADol (ULTRAM) 50 MG tablet Take 1 tablet (50 mg total) by mouth every 6 (six) hours as needed.  . potassium chloride SA (K-DUR) 20 MEQ tablet Take 1 tablet (20 mEq total) by mouth 2 (two) times daily. (Patient not taking: Reported on 02/18/2019)  . sulfamethoxazole-trimethoprim (BACTRIM DS) 800-160 MG tablet Take 1 tablet by mouth 2 (two) times daily. (Patient not taking: Reported on 02/18/2019)   No facility-administered encounter medications on file as of 02/18/2019.      Patient Active Problem List   Diagnosis Date Noted  . Hypoglycemia 10/20/2018  . Atrial fibrillation ([place]) 10/20/2018  . Type 2 diabetes mellitus ([place]) 10/20/2018  . Genetic testing 10/14/2018  . Family history of breast cancer   . Family history of uterine cancer   . Port-A-Cath in place 08/09/2018  . Malignant neoplasm of upper-outer quadrant of left breast in female, estrogen receptor positive ([place]) 07/25/2018  .  Unspecified atrial fibrillation ([place]) 02/14/2018  . Cellulitis, leg 02/13/2018  . Uncontrolled type 2 diabetes mellitus with hyperglycemia, with long-term current use of insulin ([place]) 02/13/2018  . Essential hypertension 02/13/2018  . Hyperlipidemia 02/13/2018  . Obesity, Class III, BMI 40-49.9 (morbid obesity) ([place]) 02/13/2018     Health Maintenance Due  Topic Date Due  . Hepatitis C Screening  04/10/48  . FOOT EXAM  05/14/1958  . OPHTHALMOLOGY EXAM  05/14/1958  . URINE MICROALBUMIN  05/14/1958  . TETANUS/TDAP  05/15/1967  . COLONOSCOPY  05/14/1998   . DEXA SCAN  05/14/2013  . PNA vac Low Risk Adult (1 of 2 - PCV13) 05/14/2013  . HEMOGLOBIN A1C  02/09/2019    Social History   Tobacco Use  . Smoking status: Never Smoker  . Smokeless tobacco: Never Used  Substance Use Topics  . Alcohol use: Not Currently  . Drug use: Never  family history includes Breast cancer in her cousin and cousin; CAD in her brother; Hyperlipidemia in her mother; Hypertension in her brother and mother; Uterine cancer in her maternal grandmother. Review of Systems Swelling to legs bilaterally intermittent. 12 point ros is negative Physical Exam   BP (!) 151/83   Pulse (!) 105   Temp 98.7 F (37.1 C) (Oral)   SpO2 98%   Physical Exam  Constitutional:  oriented to person, place, and time. appears well-developed and well-nourished. No distress.  HENT: [place]/AT, PERRLA, no scleral icterus Mouth/Throat: Oropharynx is clear and moist. No oropharyngeal exudate.  Cardiovascular: Normal rate, regular rhythm and normal heart sounds. Exam reveals no gallop and no friction rub.  No murmur heard.  Pulmonary/Chest: Effort normal and breath sounds normal. No respiratory distress.  has no wheezes.  Ext: marked edema to both legs with R>L with chronic venous stasis changes  Skin: Skin is warm and dry. No rash noted. No erythema. Slight blanching to right leg Psychiatric: a normal mood and affect.  behavior is normal.   CBC Lab Results  Component Value Date   WBC 8.1 01/31/2019   RBC 3.58 (L) 01/31/2019   HGB 9.2 (L) 01/31/2019   HCT 30.0 (L) 01/31/2019   PLT 221 01/31/2019   MCV 83.8 01/31/2019   MCH 25.7 (L) 01/31/2019   MCHC 30.7 01/31/2019   RDW 15.9 (H) 01/31/2019   LYMPHSABS 0.7 01/31/2019   MONOABS 0.6 01/31/2019   EOSABS 0.2 01/31/2019    BMET Lab Results  Component Value Date   NA 140 01/31/2019   K 3.9 01/31/2019   CL 98 01/31/2019   CO2 32 01/31/2019   GLUCOSE 143 (H) 01/31/2019   BUN 22 01/31/2019   CREATININE 0.83 01/31/2019   CALCIUM 8.0  (L) 01/31/2019   GFRNONAA >60 01/31/2019   GFRAA >60 01/31/2019      Assessment and Plan  lymphadema with chronic venous changes = recommend lymphadema wraps  If has recurrence cellulitis = would do penicillin/amoxicillin if recurrence occurs. At this time, recommend to stop taking abtx

## 2019-02-19 ENCOUNTER — Ambulatory Visit: Payer: Medicare Other

## 2019-02-19 ENCOUNTER — Ambulatory Visit: Payer: Medicare Other | Admitting: Hematology and Oncology

## 2019-02-19 ENCOUNTER — Other Ambulatory Visit: Payer: Medicare Other

## 2019-02-20 DIAGNOSIS — I4891 Unspecified atrial fibrillation: Secondary | ICD-10-CM | POA: Diagnosis not present

## 2019-02-20 DIAGNOSIS — F419 Anxiety disorder, unspecified: Secondary | ICD-10-CM | POA: Diagnosis not present

## 2019-02-20 DIAGNOSIS — I890 Lymphedema, not elsewhere classified: Secondary | ICD-10-CM | POA: Diagnosis not present

## 2019-02-20 DIAGNOSIS — I130 Hypertensive heart and chronic kidney disease with heart failure and stage 1 through stage 4 chronic kidney disease, or unspecified chronic kidney disease: Secondary | ICD-10-CM | POA: Diagnosis not present

## 2019-02-20 DIAGNOSIS — C50912 Malignant neoplasm of unspecified site of left female breast: Secondary | ICD-10-CM | POA: Diagnosis not present

## 2019-02-20 DIAGNOSIS — I272 Pulmonary hypertension, unspecified: Secondary | ICD-10-CM | POA: Diagnosis not present

## 2019-02-20 DIAGNOSIS — I872 Venous insufficiency (chronic) (peripheral): Secondary | ICD-10-CM | POA: Diagnosis not present

## 2019-02-20 DIAGNOSIS — I5033 Acute on chronic diastolic (congestive) heart failure: Secondary | ICD-10-CM | POA: Diagnosis not present

## 2019-02-20 DIAGNOSIS — E1122 Type 2 diabetes mellitus with diabetic chronic kidney disease: Secondary | ICD-10-CM | POA: Diagnosis not present

## 2019-02-20 DIAGNOSIS — N189 Chronic kidney disease, unspecified: Secondary | ICD-10-CM | POA: Diagnosis not present

## 2019-02-20 NOTE — Progress Notes (Signed)
Patient Care Team: [name], MD as PCP - General (Family Medicine) Harl Bowie Alphonse Guild, MD as PCP - Cardiology (Cardiology)  DIAGNOSIS:    ICD-10-CM   1. Malignant neoplasm of upper-outer quadrant of left breast in female, estrogen receptor positive ([place])  C50.412    Z17.0     SUMMARY OF ONCOLOGIC HISTORY: Oncology History  Malignant neoplasm of upper-outer quadrant of left breast in female, estrogen receptor positive ([place])  07/09/2018 Initial Diagnosis   Screening mammogram detected spiculated mass in the left breast upper outer quadrant, 2.6 cm by MRI, axilla negative, biopsy revealed grade 2 IDC with DCIS, ER 100%, PR 70%, Ki-67 10%, HER-2 3+ positive, T2N0 stage Ib clinical stage   07/25/2018 Cancer Staging   Staging form: Breast, AJCC 8th Edition - Clinical stage from 07/25/2018: Stage IB (cT2, cN0, cM0, G2, ER+, PR+, HER2+) - Signed by Nicholas Lose, MD on 07/25/2018   08/02/2018 -  Neo-Adjuvant Chemotherapy   Neoadjuvant chemotherapy with Taxol weekly with Herceptin and Perjeta every 3 weeks   10/13/2018 Genetic Testing   Negative genetic testing on the multicancer panel.  The Multi-Gene Panel offered by [facility] includes sequencing and/or deletion duplication testing of the following 85 genes: AIP, ALK, APC, ATM, AXIN2,BAP1,  BARD1, BLM, BMPR1A, BRCA1, BRCA2, BRIP1, CASR, CDC73, CDH1, CDK4, CDKN1B, CDKN1C, CDKN2A (p14ARF), CDKN2A (p16INK4a), CEBPA, CHEK2, CTNNA1, DICER1, DIS3L2, EGFR (c.2369C>T, p.Thr790Met variant only), EPCAM (Deletion/duplication testing only), FH, FLCN, GATA2, GPC3, GREM1 (Promoter region deletion/duplication testing only), HOXB13 (c.251G>A, p.Gly84Glu), HRAS, KIT, MAX, MEN1, MET, MITF (c.952G>A, p.Glu318Lys variant only), MLH1, MSH2, MSH3, MSH6, MUTYH, NBN, NF1, NF2, NTHL1, PALB2, PDGFRA, PHOX2B, PMS2, POLD1, POLE, POT1, PRKAR1A, PTCH1, PTEN, RAD50, RAD51C, RAD51D, RB1, RECQL4, RET, RNF43, RUNX1, SDHAF2, SDHA (sequence changes only), SDHB, SDHC, SDHD, SMAD4,  SMARCA4, SMARCB1, SMARCE1, STK11, SUFU, TERC, TERT, TMEM127, TP53, TSC1, TSC2, VHL, WRN and WT1.  The report date is Oct 13, 2018.   12/10/2018 Surgery   Left lumpectomy ([facility]): IDC, 1.5cm, grade 2, HER-2 negative, ER+ 100%, PR+ 80%, Ki67 5%, clear margins, 2 lymph nodes negative for carcinoma.    12/10/2018 Cancer Staging   Staging form: Breast, AJCC 8th Edition - Pathologic stage from 12/10/2018: No Stage Recommended (ypT1c, pN0, cM0, G2, ER+, PR+, HER2-) - Signed by Gardenia Phlegm, NP on 12/18/2018   01/21/2019 - 02/18/2019 Radiation Therapy   Adjuvant radiation   01/31/2019 -  Chemotherapy   The patient had ado-trastuzumab emtansine (KADCYLA) 460 mg in sodium chloride 0.9 % 250 mL chemo infusion, 3.6 mg/kg = 460 mg, Intravenous, Once, 1 of 11 cycles Administration: 460 mg (01/31/2019)  for chemotherapy treatment.      CHIEF COMPLIANT: Kadcyla maintenance  INTERVAL HISTORY: Destiny Nash is a 73 y.o. with above-mentioned history of left breast cancer who underwent neoadjuvant chemotherapy, a left lumpectomy, and completed radiation on 02/18/19. She is currently on Kadcyla maintenance. She presents to the clinic today for treatment. shes had fatigue and weakness since XRT. Not eating as much food. she tells me that her back hurts from the effects of getting on and off the table at radiation therapy and that she thinks that next week or so once the back gets better she will be able to do more activity.  She does not want to stop her treatment at this time.  She was given diuretics and because of that her potassium is running low.   REVIEW OF SYSTEMS:   Constitutional: Generalized fatigue and weakness using a wheelchair her daughters have  been helping her Eyes: Denies blurriness of vision Ears, nose, mouth, throat, and face: Denies mucositis or sore throat Respiratory: Denies cough, dyspnea or wheezes Cardiovascular: Denies palpitation, chest discomfort Gastrointestinal: Denies  nausea, heartburn or change in bowel habits Skin: Denies abnormal skin rashes Lymphatics: Denies new lymphadenopathy or easy bruising Neurological: Denies numbness, tingling or new weaknesses Behavioral/Psych: Mood is stable, no new changes  Extremities: Significant lower extremity edema Breast: denies any pain or lumps or nodules in either breasts All other systems were reviewed with the patient and are negative.  I have reviewed the past medical history, past surgical history, social history and family history with the patient and they are unchanged from previous note.  ALLERGIES:  is allergic to levaquin [levofloxacin] and codeine.  MEDICATIONS:  Current Outpatient Medications  Medication Sig Dispense Refill   acetaminophen (TYLENOL) 500 MG tablet Take 1,000 mg by mouth every 6 (six) hours as needed for moderate pain or headache.     albuterol (PROVENTIL HFA;VENTOLIN HFA) 108 (90 Base) MCG/ACT inhaler Inhale 2 puffs into the lungs every 6 (six) hours as needed for wheezing or shortness of breath.     apixaban (ELIQUIS) 5 MG TABS tablet Take 1 tablet (5 mg total) by mouth 2 (two) times daily. 60 tablet 0   cholestyramine (QUESTRAN) 4 g packet Take 1 packet (4 g total) by mouth 3 (three) times daily with meals. (Patient taking differently: Take 4 g by mouth daily as needed. ) 60 each 12   CINNAMON PO Take 1 tablet by mouth daily.     diltiazem (CARDIZEM CD) 300 MG 24 hr capsule Take 1 capsule (300 mg total) by mouth daily. 90 capsule 3   diphenoxylate-atropine (LOMOTIL) 2.5-0.025 MG tablet Take 1 tablet by mouth 4 (four) times daily as needed for diarrhea or loose stools. 120 tablet 3   doxycycline (VIBRA-TABS) 100 MG tablet Take 1 tablet (100 mg total) by mouth 2 (two) times daily. 60 tablet 0   furosemide (LASIX) 40 MG tablet Take 1.5 tablets (60 mg total) by mouth daily. 135 tablet 3   insulin detemir (LEVEMIR) 100 UNIT/ML injection Inject 0.11 mLs (11 Units total) into the  skin at bedtime. (Patient taking differently: Inject 8 Units into the skin at bedtime. )  0   LORazepam (ATIVAN) 1 MG tablet Take 1 tablet 30 min before radiotherapy, PRN anxiety and claustrophobia. 20 tablet 0   metFORMIN (GLUCOPHAGE) 500 MG tablet Take 1,000 mg by mouth 2 (two) times daily with a meal.      niacin 500 MG tablet Take 500 mg by mouth daily.     NYSTATIN powder Apply 1 Bottle topically daily as needed (irritation).      Omega-3 1000 MG CAPS Take 1,000 mg by mouth 2 (two) times daily.     pantoprazole (PROTONIX) 40 MG tablet Take 1 tablet (40 mg total) by mouth daily. 30 tablet 3   potassium chloride SA (K-DUR) 20 MEQ tablet Take 1 tablet (20 mEq total) by mouth 2 (two) times daily. (Patient not taking: Reported on 02/18/2019) 60 tablet 0   pravastatin (PRAVACHOL) 20 MG tablet Take 20 mg by mouth daily before breakfast.   0   sulfamethoxazole-trimethoprim (BACTRIM DS) 800-160 MG tablet Take 1 tablet by mouth 2 (two) times daily. (Patient not taking: Reported on 02/18/2019) 60 tablet 0   traMADol (ULTRAM) 50 MG tablet Take 1 tablet (50 mg total) by mouth every 6 (six) hours as needed. 20 tablet 0   No  current facility-administered medications for this visit.     PHYSICAL EXAMINATION: ECOG PERFORMANCE STATUS: 3 - Symptomatic, >50% confined to bed  Vitals:   02/21/19 0906  BP: 131/76  Pulse: 99  Resp: 17  Temp: 98 F (36.7 C)  SpO2: 99%   Filed Weights   02/21/19 0906  Weight: 261 lb 9.6 oz (118.7 kg)    GENERAL: alert, no distress and comfortable SKIN: skin color, texture, turgor are normal, no rashes or significant lesions EYES: normal, Conjunctiva are pink and non-injected, sclera clear OROPHARYNX: no exudate, no erythema and lips, buccal mucosa, and tongue normal  NECK: supple, thyroid normal size, non-tender, without nodularity LYMPH: no palpable lymphadenopathy in the cervical, axillary or inguinal LUNGS: clear to auscultation and percussion with normal  breathing effort HEART: regular rate & rhythm and no murmurs and no lower extremity edema ABDOMEN: abdomen soft, non-tender and normal bowel sounds MUSCULOSKELETAL: no cyanosis of digits and no clubbing  NEURO: alert & oriented x 3 with fluent speech, no focal motor/sensory deficits EXTREMITIES: 3+ lower extremity edema  LABORATORY DATA:  I have reviewed the data as listed CMP Latest Ref Rng & Units 01/31/2019 01/15/2019 12/18/2018  Glucose 70 - 99 mg/dL 143(H) 140(H) 105(H)  BUN 8 - 23 mg/dL 22 32(H) 16  Creatinine 0.44 - 1.00 mg/dL 0.83 1.03(H) 0.77  Sodium 135 - 145 mmol/L 140 136 138  Potassium 3.5 - 5.1 mmol/L 3.9 5.6(H) 4.3  Chloride 98 - 111 mmol/L 98 101 98  CO2 22 - 32 mmol/L 32 27 29  Calcium 8.9 - 10.3 mg/dL 8.0(L) 8.7(L) 8.0(L)  Total Protein 6.5 - 8.1 g/dL 6.5 - 6.3(L)  Total Bilirubin 0.3 - 1.2 mg/dL 0.3 - 0.4  Alkaline Phos 38 - 126 U/L 110 - 108  AST 15 - 41 U/L 11(L) - 16  ALT 0 - 44 U/L 6 - 10    Lab Results  Component Value Date   WBC 12.5 (H) 02/21/2019   HGB 8.5 (L) 02/21/2019   HCT 27.7 (L) 02/21/2019   MCV 79.8 (L) 02/21/2019   PLT 201 02/21/2019   NEUTROABS 10.2 (H) 02/21/2019    ASSESSMENT & PLAN:  Malignant neoplasm of upper-outer quadrant of left breast in female, estrogen receptor positive ([place]) 07/09/2018:Screening mammogram detected spiculated mass in the left breast upper outer quadrant, 2.6 cm by MRI, axilla negative, 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 Neoadjuvant chemotherapy withTaxol weekly x8Herceptin Perjeta (Perjeta discontinued due to diarrhea)every 3 weeksfollowed by  Kadcylamaintenance for 1 year 2. 12/10/2018:Left lumpectomy ([facility]): IDC, 1.5cm, grade 2, HER-2 negative, ER+ 100%, PR+ 80%, Ki67 5%, clear margins, 2 lymph nodes negative for carcinoma.  3. Followed by adjuvant radiation therapy started 01/21/2019 4.Followed by  adjuvant antiestrogen therapy ------------------------------------------------------------------------------------------------------------------------------------- Current treatment: Kadcyla maintenance started 01/31/2019 Kadcyla toxicities: No major side effects to Kadcyla. We will reduce the dosage of Kadcyla because of anemia. 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 Return to clinic every 3 weeks for Kadcyla and follow-up.  8/18: adjuvant radiation 8/28: Kadcyla  Treatment: Kadcyla Echocardiogram: 11/15/2018, EF greater than 65% If she continues to decline in the performance status then we will discontinue Kadcyla.  Hypokalemia: Sent prescription for potassium twice a day.  She will get 40 mEq in the infusion. Return to clinic in 3 weeks for next dose. No orders of the defined types were placed  in this encounter.  The patient has a good understanding of the overall plan. she agrees with it. she will call with any problems that may develop before the next visit here.  Nicholas Lose, MD 02/21/2019  Julious Oka Dorshimer am acting as scribe for Dr. Nicholas Lose.  I have reviewed the above documentation for accuracy and completeness, and I agree with the above.

## 2019-02-21 ENCOUNTER — Other Ambulatory Visit: Payer: Self-pay

## 2019-02-21 ENCOUNTER — Inpatient Hospital Stay: Payer: Medicare Other

## 2019-02-21 ENCOUNTER — Inpatient Hospital Stay (HOSPITAL_BASED_OUTPATIENT_CLINIC_OR_DEPARTMENT_OTHER): Payer: Medicare Other | Admitting: Hematology and Oncology

## 2019-02-21 ENCOUNTER — Encounter: Payer: Self-pay | Admitting: Radiation Oncology

## 2019-02-21 ENCOUNTER — Inpatient Hospital Stay: Payer: Medicare Other | Attending: Hematology and Oncology

## 2019-02-21 DIAGNOSIS — Z7984 Long term (current) use of oral hypoglycemic drugs: Secondary | ICD-10-CM | POA: Diagnosis not present

## 2019-02-21 DIAGNOSIS — C50412 Malignant neoplasm of upper-outer quadrant of left female breast: Secondary | ICD-10-CM

## 2019-02-21 DIAGNOSIS — Z7901 Long term (current) use of anticoagulants: Secondary | ICD-10-CM | POA: Insufficient documentation

## 2019-02-21 DIAGNOSIS — R531 Weakness: Secondary | ICD-10-CM | POA: Diagnosis not present

## 2019-02-21 DIAGNOSIS — Z5112 Encounter for antineoplastic immunotherapy: Secondary | ICD-10-CM | POA: Diagnosis not present

## 2019-02-21 DIAGNOSIS — D6481 Anemia due to antineoplastic chemotherapy: Secondary | ICD-10-CM | POA: Insufficient documentation

## 2019-02-21 DIAGNOSIS — Z923 Personal history of irradiation: Secondary | ICD-10-CM | POA: Insufficient documentation

## 2019-02-21 DIAGNOSIS — Z170 Estrogen receptor positive status [ER+]: Secondary | ICD-10-CM | POA: Diagnosis not present

## 2019-02-21 DIAGNOSIS — E119 Type 2 diabetes mellitus without complications: Secondary | ICD-10-CM | POA: Insufficient documentation

## 2019-02-21 DIAGNOSIS — Z794 Long term (current) use of insulin: Secondary | ICD-10-CM | POA: Insufficient documentation

## 2019-02-21 DIAGNOSIS — R5383 Other fatigue: Secondary | ICD-10-CM | POA: Diagnosis not present

## 2019-02-21 DIAGNOSIS — Z79899 Other long term (current) drug therapy: Secondary | ICD-10-CM | POA: Insufficient documentation

## 2019-02-21 DIAGNOSIS — E876 Hypokalemia: Secondary | ICD-10-CM

## 2019-02-21 DIAGNOSIS — Z95828 Presence of other vascular implants and grafts: Secondary | ICD-10-CM

## 2019-02-21 LAB — CMP (CANCER CENTER ONLY)
ALT: 6 U/L (ref 0–44)
AST: 16 U/L (ref 15–41)
Albumin: 2.4 g/dL — ABNORMAL LOW (ref 3.5–5.0)
Alkaline Phosphatase: 143 U/L — ABNORMAL HIGH (ref 38–126)
Anion gap: 12 (ref 5–15)
BUN: 21 mg/dL (ref 8–23)
CO2: 32 mmol/L (ref 22–32)
Calcium: 6.9 mg/dL — ABNORMAL LOW (ref 8.9–10.3)
Chloride: 96 mmol/L — ABNORMAL LOW (ref 98–111)
Creatinine: 0.89 mg/dL (ref 0.44–1.00)
GFR, Est AFR Am: >60 mL/min (ref >60)
GFR, Estimated: >60 mL/min (ref >60)
Glucose, Bld: 130 mg/dL — ABNORMAL HIGH (ref 70–99)
Potassium: 2.9 mmol/L — CL (ref 3.5–5.1)
Sodium: 140 mmol/L (ref 135–145)
Total Bilirubin: 0.4 mg/dL (ref 0.3–1.2)
Total Protein: 6.4 g/dL — ABNORMAL LOW (ref 6.5–8.1)

## 2019-02-21 LAB — CBC WITH DIFFERENTIAL (CANCER CENTER ONLY)
Abs Immature Granulocytes: 0.06 10*3/uL (ref 0.00–0.07)
Basophils Absolute: 0 10*3/uL (ref 0.0–0.1)
Basophils Relative: 0 %
Eosinophils Absolute: 0.1 10*3/uL (ref 0.0–0.5)
Eosinophils Relative: 1 %
HCT: 27.7 % — ABNORMAL LOW (ref 36.0–46.0)
Hemoglobin: 8.5 g/dL — ABNORMAL LOW (ref 12.0–15.0)
Immature Granulocytes: 1 %
Lymphocytes Relative: 7 %
Lymphs Abs: 0.9 10*3/uL (ref 0.7–4.0)
MCH: 24.5 pg — ABNORMAL LOW (ref 26.0–34.0)
MCHC: 30.7 g/dL (ref 30.0–36.0)
MCV: 79.8 fL — ABNORMAL LOW (ref 80.0–100.0)
Monocytes Absolute: 1.2 10*3/uL — ABNORMAL HIGH (ref 0.1–1.0)
Monocytes Relative: 9 %
Neutro Abs: 10.2 10*3/uL — ABNORMAL HIGH (ref 1.7–7.7)
Neutrophils Relative %: 82 %
Platelet Count: 201 10*3/uL (ref 150–400)
RBC: 3.47 MIL/uL — ABNORMAL LOW (ref 3.87–5.11)
RDW: 16.2 % — ABNORMAL HIGH (ref 11.5–15.5)
WBC Count: 12.5 10*3/uL — ABNORMAL HIGH (ref 4.0–10.5)
nRBC: 0 % (ref 0.0–0.2)

## 2019-02-21 MED ORDER — SODIUM CHLORIDE 0.9% FLUSH
10.0000 mL | Freq: Once | INTRAVENOUS | Status: AC
  Administered 2019-02-21: 10 mL
  Filled 2019-02-21: qty 10

## 2019-02-21 MED ORDER — POTASSIUM CHLORIDE CRYS ER 20 MEQ PO TBCR
EXTENDED_RELEASE_TABLET | ORAL | Status: AC
  Filled 2019-02-21: qty 2

## 2019-02-21 MED ORDER — ACETAMINOPHEN 325 MG PO TABS
ORAL_TABLET | ORAL | Status: AC
  Filled 2019-02-21: qty 2

## 2019-02-21 MED ORDER — SODIUM CHLORIDE 0.9 % IV SOLN
Freq: Once | INTRAVENOUS | Status: AC
  Administered 2019-02-21: 10:00:00 via INTRAVENOUS
  Filled 2019-02-21: qty 250

## 2019-02-21 MED ORDER — SODIUM CHLORIDE 0.9 % IV SOLN
2.9000 mg/kg | Freq: Once | INTRAVENOUS | Status: AC
  Administered 2019-02-21: 11:00:00 360 mg via INTRAVENOUS
  Filled 2019-02-21: qty 8

## 2019-02-21 MED ORDER — ACETAMINOPHEN 325 MG PO TABS
650.0000 mg | ORAL_TABLET | Freq: Once | ORAL | Status: AC
  Administered 2019-02-21: 650 mg via ORAL

## 2019-02-21 MED ORDER — HEPARIN SOD (PORK) LOCK FLUSH 100 UNIT/ML IV SOLN
500.0000 [IU] | Freq: Once | INTRAVENOUS | Status: AC | PRN
  Administered 2019-02-21: 500 [IU]
  Filled 2019-02-21: qty 5

## 2019-02-21 MED ORDER — DIPHENHYDRAMINE HCL 25 MG PO CAPS
ORAL_CAPSULE | ORAL | Status: AC
  Filled 2019-02-21: qty 2

## 2019-02-21 MED ORDER — POTASSIUM CHLORIDE CRYS ER 20 MEQ PO TBCR: 20.0000 meq | EXTENDED_RELEASE_TABLET | Freq: Two times a day (BID) | ORAL | 0 refills | Status: DC

## 2019-02-21 MED ORDER — POTASSIUM CHLORIDE CRYS ER 20 MEQ PO TBCR
40.0000 meq | EXTENDED_RELEASE_TABLET | Freq: Once | ORAL | Status: AC
  Administered 2019-02-21: 40 meq via ORAL

## 2019-02-21 MED ORDER — SODIUM CHLORIDE 0.9% FLUSH
10.0000 mL | INTRAVENOUS | Status: DC | PRN
  Administered 2019-02-21: 10 mL
  Filled 2019-02-21: qty 10

## 2019-02-21 MED ORDER — DIPHENHYDRAMINE HCL 25 MG PO CAPS
50.0000 mg | ORAL_CAPSULE | Freq: Once | ORAL | Status: AC
  Administered 2019-02-21: 10:00:00 50 mg via ORAL

## 2019-02-21 NOTE — Patient Instructions (Signed)
[facility] Discharge Instructions for Patients Receiving Chemotherapy  Today you received the following chemotherapy agent: Ado-Trastuzumab Emtansine Illene Bolus).  To help prevent nausea and vomiting after your treatment, we encourage you to take your nausea medication.   If you develop nausea and vomiting that is not controlled by your nausea medication, call the clinic.   BELOW ARE SYMPTOMS THAT SHOULD BE REPORTED IMMEDIATELY:  *FEVER GREATER THAN 100.5 F  *CHILLS WITH OR WITHOUT FEVER  NAUSEA AND VOMITING THAT IS NOT CONTROLLED WITH YOUR NAUSEA MEDICATION  *UNUSUAL SHORTNESS OF BREATH  *UNUSUAL BRUISING OR BLEEDING  TENDERNESS IN MOUTH AND THROAT WITH OR WITHOUT PRESENCE OF ULCERS  *URINARY PROBLEMS  *BOWEL PROBLEMS  UNUSUAL RASH Items with * indicate a potential emergency and should be followed up as soon as possible.  Feel free to call the clinic should you have any questions or concerns. The clinic phone number is [phone].  Please show the [facility] at check-in to the Emergency Department and triage nurse.  Ado-Trastuzumab Emtansine for injection What is this medicine? ADO-TRASTUZUMAB EMTANSINE (ADD oh traz TOO zuh mab em TAN zine) is a monoclonal antibody combined with chemotherapy. It is used to treat breast cancer. This medicine may be used for other purposes; ask your health care provider or pharmacist if you have questions. COMMON BRAND NAME(S): Kadcyla What should I tell my health care provider before I take this medicine? They need to know if you have any of these conditions:  heart disease  heart failure  infection (especially a virus infection such as chickenpox, cold sores, or herpes)  liver disease  lung or breathing disease, like asthma  tingling of the fingers or toes, or other nerve disorder  an unusual or allergic reaction to ado-trastuzumab emtansine, other medications, foods, dyes, or preservatives  pregnant or  trying to get pregnant  breast-feeding How should I use this medicine? This medicine is for infusion into a vein. It is given by a health care professional in a hospital or clinic setting. Talk to your pediatrician regarding the use of this medicine in children. Special care may be needed. Overdosage: If you think you have taken too much of this medicine contact a poison control center or emergency room at once. NOTE: This medicine is only for you. Do not share this medicine with others. What if I miss a dose? It is important not to miss your dose. Call your doctor or health care professional if you are unable to keep an appointment. What may interact with this medicine? This medicine may also interact with the following medications:  atazanavir  boceprevir  clarithromycin  delavirdine  indinavir  dalfopristin; quinupristin  isoniazid, INH  itraconazole  ketoconazole  nefazodone  nelfinavir  ritonavir  telaprevir  telithromycin  tipranavir  voriconazole This list may not describe all possible interactions. Give your health care provider a list of all the medicines, herbs, non-prescription drugs, or dietary supplements you use. Also tell them if you smoke, drink alcohol, or use illegal drugs. Some items may interact with your medicine. What should I watch for while using this medicine? Visit your doctor for checks on your progress. This drug may make you feel generally unwell. This is not uncommon, as chemotherapy can affect healthy cells as well as cancer cells. Report any side effects. Continue your course of treatment even though you feel ill unless your doctor tells you to stop. You may need blood work done while you are taking this medicine.  Call your doctor or health care professional for advice if you get a fever, chills or sore throat, or other symptoms of a cold or flu. Do not treat yourself. This drug decreases your body's ability to fight infections. Try to  avoid being around people who are sick. Be careful brushing and flossing your teeth or using a toothpick because you may get an infection or bleed more easily. If you have any dental work done, tell your dentist you are receiving this medicine. Avoid taking products that contain aspirin, acetaminophen, ibuprofen, naproxen, or ketoprofen unless instructed by your doctor. These medicines may hide a fever. Do not become pregnant while taking this medicine or for 7 months after stopping it, men with female partners should use contraception during treatment and for 4 months after the last dose. Women should inform their doctor if they wish to become pregnant or think they might be pregnant. There is a potential for serious side effects to an unborn child. Do not breast-feed an infant while taking this medicine or for 7 months after the last dose. Men who have a partner who is pregnant or who is capable of becoming pregnant should use a condom during sexual activity while taking this medicine and for 4 months after stopping it. Men should inform their doctors if they wish to father a child. This medicine may lower sperm counts. Talk to your health care professional or pharmacist for more information. What side effects may I notice from receiving this medicine? Side effects that you should report to your doctor or health care professional as soon as possible:  allergic reactions like skin rash, itching or hives, swelling of the face, lips, or tongue  breathing problems  chest pain or palpitations  fever or chills, sore throat  general ill feeling or flu-like symptoms  light-colored stools  nausea, vomiting  pain, tingling, numbness in the hands or feet  signs and symptoms of bleeding such as bloody or black, tarry stools; red or dark-brown urine; spitting up blood or brown material that looks like coffee grounds; red spots on the skin; unusual bruising or bleeding from the eye, gums, or nose   swelling of the legs or ankles  yellowing of the eyes or skin Side effects that usually do not require medical attention (report to your doctor or health care professional if they continue or are bothersome):  changes in taste  constipation  dizziness  headache  joint pain  muscle pain  trouble sleeping  unusually weak or tired This list may not describe all possible side effects. Call your doctor for medical advice about side effects. You may report side effects to FDA at 1-800-FDA-1088. Where should I keep my medicine? This drug is given in a hospital or clinic and will not be stored at home. NOTE: This sheet is a summary. It may not cover all possible information. If you have questions about this medicine, talk to your doctor, pharmacist, or health care provider.  2020 Elsevier/Gold Standard (2017-10-19 10:03:15)  Coronavirus (COVID-19) Are you at risk?  Are you at risk for the Coronavirus (COVID-19)?  To be considered HIGH RISK for Coronavirus (COVID-19), you have to meet the following criteria:  . Traveled to [place], [place], [place], [place] or [place]; or in the [place] to [place], [place], [place], or [place]; and have fever, cough, and shortness of breath within the last 2 weeks of travel OR . Been in close contact with a person diagnosed with COVID-19 within  the last 2 weeks and have fever, cough, and shortness of breath . IF YOU DO NOT MEET THESE CRITERIA, YOU ARE CONSIDERED LOW RISK FOR COVID-19.  What to do if you are HIGH RISK for COVID-19?  Marland Kitchen If you are having a medical emergency, call 911. . Seek medical care right away. Before you go to a doctor's office, urgent care or emergency department, call ahead and tell them about your recent travel, contact with someone diagnosed with COVID-19, and your symptoms. You should receive instructions from your physician's office regarding next steps of care.  . When you arrive at healthcare provider, tell the  healthcare staff immediately you have returned from visiting [place], [place], [place], [place] or [place]; or traveled in the [place] to [place], [place], [place], or [place]; in the last two weeks or you have been in close contact with a person diagnosed with COVID-19 in the last 2 weeks.   . Tell the health care staff about your symptoms: fever, cough and shortness of breath. . After you have been seen by a medical provider, you will be either: o Tested for (COVID-19) and discharged home on quarantine except to seek medical care if symptoms worsen, and asked to  - Stay home and avoid contact with others until you get your results (4-5 days)  - Avoid travel on public transportation if possible (such as bus, train, or airplane) or o Sent to the Emergency Department by EMS for evaluation, COVID-19 testing, and possible admission depending on your condition and test results.  What to do if you are LOW RISK for COVID-19?  Reduce your risk of any infection by using the same precautions used for avoiding the common cold or flu:  Marland Kitchen Wash your hands often with soap and warm water for at least 20 seconds.  If soap and water are not readily available, use an alcohol-based hand sanitizer with at least 60% alcohol.  . If coughing or sneezing, cover your mouth and nose by coughing or sneezing into the elbow areas of your shirt or coat, into a tissue or into your sleeve (not your hands). . Avoid shaking hands with others and consider head nods or verbal greetings only. . Avoid touching your eyes, nose, or mouth with unwashed hands.  . Avoid close contact with people who are sick. . Avoid places or events with large numbers of people in one location, like concerts or sporting events. . Carefully consider travel plans you have or are making. . If you are planning any travel outside or inside the [place], visit the CDC's Travelers' Health webpage for the latest health notices. . If you have some symptoms  but not all symptoms, continue to monitor at home and seek medical attention if your symptoms worsen. . If you are having a medical emergency, call 911.   [place] / e-Visit: [email or web address]         [facility]: [facility]: [phone]                   [facility]: [phone]. Hypokalemia Hypokalemia means that the amount of potassium in the blood is lower than normal. Potassium is a chemical (electrolyte) that helps regulate the amount of fluid in the body. It also stimulates muscle tightening (contraction) and helps nerves work properly. Normally, most of the body's potassium is inside cells, and only a very small amount is  in the blood. Because the amount in the blood is so small, minor changes to potassium levels in the blood can be life-threatening. What are the causes? This condition may be caused by:  Antibiotic medicine.  Diarrhea or vomiting. Taking too much of a medicine that helps you have a bowel movement (laxative) can cause diarrhea and lead to hypokalemia.  Chronic kidney disease (CKD).  Medicines that help the body get rid of excess fluid (diuretics).  Eating disorders, such as bulimia.  Low magnesium levels in the body.  Sweating a lot. What are the signs or symptoms? Symptoms of this condition include:  Weakness.  Constipation.  Fatigue.  Muscle cramps.  Mental confusion.  Skipped heartbeats or irregular heartbeat (palpitations).  Tingling or numbness. How is this diagnosed? This condition is diagnosed with a blood test. How is this treated? This condition may be treated by:  Taking potassium supplements by mouth.  Adjusting the medicines that you take.  Eating more foods that contain a lot of potassium. If your potassium level is very low, you may need to get potassium through an IV and  be monitored in the hospital. Follow these instructions at home:   Take over-the-counter and prescription medicines only as told by your health care provider. This includes vitamins and supplements.  Eat a healthy diet. A healthy diet includes fresh fruits and vegetables, whole grains, healthy fats, and lean proteins.  If instructed, eat more foods that contain a lot of potassium. This includes: ? Nuts, such as peanuts and pistachios. ? Seeds, such as sunflower seeds and pumpkin seeds. ? Peas, lentils, and lima beans. ? Whole grain and bran cereals and breads. ? Fresh fruits and vegetables, such as apricots, avocado, bananas, cantaloupe, kiwi, oranges, tomatoes, asparagus, and potatoes. ? Orange juice. ? Tomato juice. ? Red meats. ? Yogurt.  Keep all follow-up visits as told by your health care provider. This is important. Contact a health care provider if you:  Have weakness that gets worse.  Feel your heart pounding or racing.  Vomit.  Have diarrhea.  Have diabetes (diabetes mellitus) and you have trouble keeping your blood sugar (glucose) in your target range. Get help right away if you:  Have chest pain.  Have shortness of breath.  Have vomiting or diarrhea that lasts for more than 2 days.  Faint. Summary  Hypokalemia means that the amount of potassium in the blood is lower than normal.  This condition is diagnosed with a blood test.  Hypokalemia may be treated by taking potassium supplements, adjusting the medicines that you take, or eating more foods that are high in potassium.  If your potassium level is very low, you may need to get potassium through an IV and be monitored in the hospital. This information is not intended to replace advice given to you by your health care provider. Make sure you discuss any questions you have with your health care provider. Document Released: 05/22/2005 Document Revised: 01/02/2018 Document Reviewed: 01/02/2018 Elsevier  Patient Education  2020 [employer].

## 2019-02-21 NOTE — Assessment & Plan Note (Signed)
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 Neoadjuvant chemotherapy withTaxol weekly x8Herceptin Perjeta (Perjeta discontinued due to diarrhea)every 3 weeksfollowed by  Kadcylamaintenance for 1 year 2. 12/10/2018:Left lumpectomy ([facility]): IDC, 1.5cm, grade 2, HER-2 negative, ER+ 100%, PR+ 80%, Ki67 5%, clear margins, 2 lymph nodes negative for carcinoma.  3. Followed by adjuvant radiation therapy started 01/21/2019 4.Followed by adjuvant antiestrogen therapy ------------------------------------------------------------------------------------------------------------------------------------- Current treatment: Kadcyla maintenance started 01/31/2019 Kadcyla toxicities:  I am worried about patient's performance status. Return to clinic every 3 weeks for Kadcyla and follow-up. Once radiation is complete she will need antiestrogen therapy.   8/18: adjuvant radiation started 8/28: Kadcyla  Treatment: Kadcyla Echocardiogram: 11/15/2018, EF greater than 65% 

## 2019-02-24 ENCOUNTER — Telehealth: Payer: Self-pay | Admitting: Hematology and Oncology

## 2019-02-24 NOTE — Telephone Encounter (Signed)
I left a message regarding schedule  

## 2019-02-26 DIAGNOSIS — I130 Hypertensive heart and chronic kidney disease with heart failure and stage 1 through stage 4 chronic kidney disease, or unspecified chronic kidney disease: Secondary | ICD-10-CM | POA: Diagnosis not present

## 2019-02-26 DIAGNOSIS — E1122 Type 2 diabetes mellitus with diabetic chronic kidney disease: Secondary | ICD-10-CM | POA: Diagnosis not present

## 2019-02-26 DIAGNOSIS — I872 Venous insufficiency (chronic) (peripheral): Secondary | ICD-10-CM | POA: Diagnosis not present

## 2019-02-26 DIAGNOSIS — I5033 Acute on chronic diastolic (congestive) heart failure: Secondary | ICD-10-CM | POA: Diagnosis not present

## 2019-02-26 DIAGNOSIS — N189 Chronic kidney disease, unspecified: Secondary | ICD-10-CM | POA: Diagnosis not present

## 2019-02-26 DIAGNOSIS — I890 Lymphedema, not elsewhere classified: Secondary | ICD-10-CM | POA: Diagnosis not present

## 2019-03-04 ENCOUNTER — Encounter: Payer: Self-pay | Admitting: Hematology and Oncology

## 2019-03-04 DIAGNOSIS — E1122 Type 2 diabetes mellitus with diabetic chronic kidney disease: Secondary | ICD-10-CM | POA: Diagnosis not present

## 2019-03-04 DIAGNOSIS — I890 Lymphedema, not elsewhere classified: Secondary | ICD-10-CM | POA: Diagnosis not present

## 2019-03-04 DIAGNOSIS — I872 Venous insufficiency (chronic) (peripheral): Secondary | ICD-10-CM | POA: Diagnosis not present

## 2019-03-04 DIAGNOSIS — I130 Hypertensive heart and chronic kidney disease with heart failure and stage 1 through stage 4 chronic kidney disease, or unspecified chronic kidney disease: Secondary | ICD-10-CM | POA: Diagnosis not present

## 2019-03-04 DIAGNOSIS — I5033 Acute on chronic diastolic (congestive) heart failure: Secondary | ICD-10-CM | POA: Diagnosis not present

## 2019-03-04 DIAGNOSIS — N189 Chronic kidney disease, unspecified: Secondary | ICD-10-CM | POA: Diagnosis not present

## 2019-03-04 NOTE — Progress Notes (Signed)
Received voicemail from patient regarding applying for [employer] we spoke about in February.  Called patient and advised what is needed to apply. Patient will bring proof of income on 03/14/19 to apply and based on verbal income,she does qualify. Advised I will give her an application for the [employer] as well whom assists patients whom live in [place] with personal expenses as well. She verbalized understanding.  Emailed Social workers regarding [employer] [place] application for assistance with medical bills.  Patient has my card for any additional financial questions or concerns.

## 2019-03-05 ENCOUNTER — Encounter: Payer: Self-pay | Admitting: Hematology and Oncology

## 2019-03-05 ENCOUNTER — Telehealth: Payer: Self-pay | Admitting: Cardiology

## 2019-03-05 ENCOUNTER — Encounter: Payer: Self-pay | Admitting: General Practice

## 2019-03-05 ENCOUNTER — Encounter: Payer: Self-pay | Admitting: *Deleted

## 2019-03-05 NOTE — Telephone Encounter (Signed)
Kathlee Nations w/ [facility] called stating she's seeing pt for the pt's unna boot because of the lymphedema and she's trying to get her a compression pump through a company she deals with that will not cost the patient anything.   The company is [employer] and they will be contacting Dr. Harl Bowie since he's the one that ordered the unna boot, they will need an order for that and they should send the order here for Dr. Harl Bowie to sign.  Any questions please contact Kathlee Nations @ [phone]

## 2019-03-05 NOTE — Progress Notes (Signed)
[facility] CSW Progress Notes  Can mail information to patient on [employer] [place], [place] as well as [place] information.  Left VM but unable to reach patient at this time.  Awaiting patient call and consent to mail this information.  Edwyna Shell, LCSW Clinical Social Worker Phone:  [phone] Cell:  [phone]

## 2019-03-05 NOTE — Progress Notes (Signed)
[place] Work  Clinical Social Work received referral from Estate manager/land agent for additional financial resources.  CSW contacted patient at home to offer support and assess for needs.  CSW left patient a message encouraging patient to return call.    Johnnye Lana, MSW, LCSW, OSW-C Clinical Social Worker [facility] [phone]

## 2019-03-06 ENCOUNTER — Other Ambulatory Visit: Payer: Self-pay

## 2019-03-06 ENCOUNTER — Telehealth: Payer: Self-pay | Admitting: General Practice

## 2019-03-06 DIAGNOSIS — C50412 Malignant neoplasm of upper-outer quadrant of left female breast: Secondary | ICD-10-CM

## 2019-03-06 DIAGNOSIS — Z170 Estrogen receptor positive status [ER+]: Secondary | ICD-10-CM

## 2019-03-06 MED ORDER — ONDANSETRON HCL 8 MG PO TABS: 8.0000 mg | ORAL_TABLET | Freq: Two times a day (BID) | ORAL | 1 refills | Status: AC | PRN

## 2019-03-06 MED ORDER — PROCHLORPERAZINE MALEATE 10 MG PO TABS: 10.0000 mg | ORAL_TABLET | Freq: Four times a day (QID) | ORAL | 1 refills | Status: AC | PRN

## 2019-03-06 NOTE — Telephone Encounter (Signed)
[facility] CSW Progress Notes  Call to daughter Eustaquio Maize, left VM requesting call back.  Can give daughter information on community resources for patient.  Edwyna Shell, LCSW Clinical Social Worker Phone:  [phone]

## 2019-03-07 ENCOUNTER — Telehealth: Payer: Self-pay | Admitting: General Practice

## 2019-03-07 NOTE — Telephone Encounter (Signed)
[facility] CSW Progress Notes  Spoke with daughter, confirmed that patient would like [employer] [place] application - told daughter I would put in mail today.  Edwyna Shell, LCSW Clinical Social Worker Phone:  [phone]

## 2019-03-10 ENCOUNTER — Encounter: Payer: Self-pay | Admitting: Hematology and Oncology

## 2019-03-10 ENCOUNTER — Other Ambulatory Visit: Payer: Self-pay | Admitting: Hematology and Oncology

## 2019-03-10 DIAGNOSIS — C50412 Malignant neoplasm of upper-outer quadrant of left female breast: Secondary | ICD-10-CM

## 2019-03-10 DIAGNOSIS — Z170 Estrogen receptor positive status [ER+]: Secondary | ICD-10-CM

## 2019-03-10 MED ORDER — LORAZEPAM 1 MG PO TABS: ORAL_TABLET | ORAL | 3 refills | Status: DC

## 2019-03-13 DIAGNOSIS — I130 Hypertensive heart and chronic kidney disease with heart failure and stage 1 through stage 4 chronic kidney disease, or unspecified chronic kidney disease: Secondary | ICD-10-CM | POA: Diagnosis not present

## 2019-03-13 DIAGNOSIS — E1122 Type 2 diabetes mellitus with diabetic chronic kidney disease: Secondary | ICD-10-CM | POA: Diagnosis not present

## 2019-03-13 DIAGNOSIS — I5033 Acute on chronic diastolic (congestive) heart failure: Secondary | ICD-10-CM | POA: Diagnosis not present

## 2019-03-13 DIAGNOSIS — N189 Chronic kidney disease, unspecified: Secondary | ICD-10-CM | POA: Diagnosis not present

## 2019-03-13 DIAGNOSIS — I890 Lymphedema, not elsewhere classified: Secondary | ICD-10-CM | POA: Diagnosis not present

## 2019-03-13 DIAGNOSIS — I872 Venous insufficiency (chronic) (peripheral): Secondary | ICD-10-CM | POA: Diagnosis not present

## 2019-03-13 NOTE — Progress Notes (Signed)
Patient Care Team: [name], MD as PCP - General (Family Medicine) Harl Bowie Alphonse Guild, MD as PCP - Cardiology (Cardiology)  DIAGNOSIS:    ICD-10-CM   1. Malignant neoplasm of upper-outer quadrant of left breast in female, estrogen receptor positive ([place])  C50.412    Z17.0     SUMMARY OF ONCOLOGIC HISTORY: Oncology History  Malignant neoplasm of upper-outer quadrant of left breast in female, estrogen receptor positive ([place])  07/09/2018 Initial Diagnosis   Screening mammogram detected spiculated mass in the left breast upper outer quadrant, 2.6 cm by MRI, axilla negative, biopsy revealed grade 2 IDC with DCIS, ER 100%, PR 70%, Ki-67 10%, HER-2 3+ positive, T2N0 stage Ib clinical stage   07/25/2018 Cancer Staging   Staging form: Breast, AJCC 8th Edition - Clinical stage from 07/25/2018: Stage IB (cT2, cN0, cM0, G2, ER+, PR+, HER2+) - Signed by Nicholas Lose, MD on 07/25/2018   08/02/2018 -  Neo-Adjuvant Chemotherapy   Neoadjuvant chemotherapy with Taxol weekly with Herceptin and Perjeta every 3 weeks   10/13/2018 Genetic Testing   Negative genetic testing on the multicancer panel.  The Multi-Gene Panel offered by [facility] includes sequencing and/or deletion duplication testing of the following 85 genes: AIP, ALK, APC, ATM, AXIN2,BAP1,  BARD1, BLM, BMPR1A, BRCA1, BRCA2, BRIP1, CASR, CDC73, CDH1, CDK4, CDKN1B, CDKN1C, CDKN2A (p14ARF), CDKN2A (p16INK4a), CEBPA, CHEK2, CTNNA1, DICER1, DIS3L2, EGFR (c.2369C>T, p.Thr790Met variant only), EPCAM (Deletion/duplication testing only), FH, FLCN, GATA2, GPC3, GREM1 (Promoter region deletion/duplication testing only), HOXB13 (c.251G>A, p.Gly84Glu), HRAS, KIT, MAX, MEN1, MET, MITF (c.952G>A, p.Glu318Lys variant only), MLH1, MSH2, MSH3, MSH6, MUTYH, NBN, NF1, NF2, NTHL1, PALB2, PDGFRA, PHOX2B, PMS2, POLD1, POLE, POT1, PRKAR1A, PTCH1, PTEN, RAD50, RAD51C, RAD51D, RB1, RECQL4, RET, RNF43, RUNX1, SDHAF2, SDHA (sequence changes only), SDHB, SDHC, SDHD, SMAD4,  SMARCA4, SMARCB1, SMARCE1, STK11, SUFU, TERC, TERT, TMEM127, TP53, TSC1, TSC2, VHL, WRN and WT1.  The report date is Oct 13, 2018.   12/10/2018 Surgery   Left lumpectomy ([facility]): IDC, 1.5cm, grade 2, HER-2 negative, ER+ 100%, PR+ 80%, Ki67 5%, clear margins, 2 lymph nodes negative for carcinoma.    12/10/2018 Cancer Staging   Staging form: Breast, AJCC 8th Edition - Pathologic stage from 12/10/2018: No Stage Recommended (ypT1c, pN0, cM0, G2, ER+, PR+, HER2-) - Signed by Gardenia Phlegm, NP on 12/18/2018   01/21/2019 - 02/18/2019 Radiation Therapy   Adjuvant radiation   01/31/2019 -  Chemotherapy   The patient had ado-trastuzumab emtansine (KADCYLA) 460 mg in sodium chloride 0.9 % 250 mL chemo infusion, 3.6 mg/kg = 460 mg, Intravenous, Once, 2 of 11 cycles Dose modification: 3 mg/kg (original dose 3.6 mg/kg, Cycle 2, Reason: Dose not tolerated) Administration: 460 mg (01/31/2019), 360 mg (02/21/2019)  for chemotherapy treatment.      CHIEF COMPLIANT: Kadcyla maintenance  INTERVAL HISTORY: Destiny Nash is a 73 y.o. with above-mentioned history of left breast cancer who underwent neoadjuvant chemotherapy, a left lumpectomy, and completed radiation. She is currently on Kadcyla maintenance. She presents to the clinic today for treatment. 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  Her energy levels are also low and is using a wheelchair when she comes to the clinic but at home she is able to walk without any support.  She however gets very tired and has to rest in the middle of the day but then she feels better and can function in the evening.  She has not noticed any worsening of her situation.  She would  like to continue with the treatment going forwards.  REVIEW OF SYSTEMS:   Constitutional: Denies fevers, chills or abnormal weight loss Eyes: Denies blurriness of vision Ears, nose, mouth, throat, and face: Denies mucositis or sore  throat Respiratory: Denies cough, dyspnea or wheezes Cardiovascular: Denies palpitation, chest discomfort Gastrointestinal: Denies nausea, complains of occasional constipation Skin: Denies abnormal skin rashes Lymphatics: Denies new lymphadenopathy or easy bruising Neurological: Mild peripheral neuropathy Behavioral/Psych: Mood is stable, no new changes  Extremities: Lower extremity edema for which she is wrapping her legs Breast: denies any pain or lumps or nodules in either breasts All other systems were reviewed with the patient and are negative.  I have reviewed the past medical history, past surgical history, social history and family history with the patient and they are unchanged from previous note.  ALLERGIES:  is allergic to levaquin [levofloxacin] and codeine.  MEDICATIONS:  Current Outpatient Medications  Medication Sig Dispense Refill   acetaminophen (TYLENOL) 500 MG tablet Take 1,000 mg by mouth every 6 (six) hours as needed for moderate pain or headache.     albuterol (PROVENTIL HFA;VENTOLIN HFA) 108 (90 Base) MCG/ACT inhaler Inhale 2 puffs into the lungs every 6 (six) hours as needed for wheezing or shortness of breath.     apixaban (ELIQUIS) 5 MG TABS tablet Take 1 tablet (5 mg total) by mouth 2 (two) times daily. 60 tablet 0   cholestyramine (QUESTRAN) 4 g packet Take 1 packet (4 g total) by mouth 3 (three) times daily with meals. (Patient taking differently: Take 4 g by mouth daily as needed. ) 60 each 12   CINNAMON PO Take 1 tablet by mouth daily.     diltiazem (CARDIZEM CD) 300 MG 24 hr capsule Take 1 capsule (300 mg total) by mouth daily. 90 capsule 3   diphenoxylate-atropine (LOMOTIL) 2.5-0.025 MG tablet Take 1 tablet by mouth 4 (four) times daily as needed for diarrhea or loose stools. 120 tablet 3   doxycycline (VIBRA-TABS) 100 MG tablet Take 1 tablet (100 mg total) by mouth 2 (two) times daily. 60 tablet 0   furosemide (LASIX) 40 MG tablet Take 1.5 tablets  (60 mg total) by mouth daily. 135 tablet 3   insulin detemir (LEVEMIR) 100 UNIT/ML injection Inject 0.11 mLs (11 Units total) into the skin at bedtime. (Patient taking differently: Inject 8 Units into the skin at bedtime. )  0   LORazepam (ATIVAN) 1 MG tablet Take 1 tablet 30 min before radiotherapy, PRN anxiety and claustrophobia. 30 tablet 3   metFORMIN (GLUCOPHAGE) 500 MG tablet Take 1,000 mg by mouth 2 (two) times daily with a meal.      niacin 500 MG tablet Take 500 mg by mouth daily.     NYSTATIN powder Apply 1 Bottle topically daily as needed (irritation).      Omega-3 1000 MG CAPS Take 1,000 mg by mouth 2 (two) times daily.     ondansetron (ZOFRAN) 8 MG tablet Take 1 tablet (8 mg total) by mouth 2 (two) times daily as needed (Nausea or vomiting). 30 tablet 1   pantoprazole (PROTONIX) 40 MG tablet Take 1 tablet (40 mg total) by mouth daily. 30 tablet 3   potassium chloride SA (K-DUR) 20 MEQ tablet Take 1 tablet (20 mEq total) by mouth 2 (two) times daily. 60 tablet 0   pravastatin (PRAVACHOL) 20 MG tablet Take 20 mg by mouth daily before breakfast.   0   prochlorperazine (COMPAZINE) 10 MG tablet Take 1 tablet (10 mg  total) by mouth every 6 (six) hours as needed (Nausea or vomiting). 30 tablet 1   sulfamethoxazole-trimethoprim (BACTRIM DS) 800-160 MG tablet Take 1 tablet by mouth 2 (two) times daily. (Patient not taking: Reported on 02/18/2019) 60 tablet 0   traMADol (ULTRAM) 50 MG tablet Take 1 tablet (50 mg total) by mouth every 6 (six) hours as needed. 20 tablet 0   No current facility-administered medications for this visit.     PHYSICAL EXAMINATION: ECOG PERFORMANCE STATUS: 2 - Symptomatic, <50% confined to bed  Vitals:   03/14/19 0846  BP: (!) 153/75  Pulse: 89  Resp: 17  Temp: 98.5 F (36.9 C)  SpO2: 100%   Filed Weights    GENERAL: alert, no distress and comfortable SKIN: skin color, texture, turgor are normal, no rashes or significant lesions EYES:  normal, Conjunctiva are pink and non-injected, sclera clear OROPHARYNX: no exudate, no erythema and lips, buccal mucosa, and tongue normal  NECK: supple, thyroid normal size, non-tender, without nodularity LYMPH: no palpable lymphadenopathy in the cervical, axillary or inguinal LUNGS: clear to auscultation and percussion with normal breathing effort HEART: regular rate & rhythm and no murmurs and no lower extremity edema ABDOMEN: abdomen soft, non-tender and normal bowel sounds MUSCULOSKELETAL: no cyanosis of digits and no clubbing  NEURO: alert & oriented x 3 with fluent speech, no focal motor/sensory deficits EXTREMITIES: No lower extremity edema  LABORATORY DATA:  I have reviewed the data as listed CMP Latest Ref Rng & Units 02/21/2019 01/31/2019 01/15/2019  Glucose 70 - 99 mg/dL 130(H) 143(H) 140(H)  BUN 8 - 23 mg/dL 21 22 32(H)  Creatinine 0.44 - 1.00 mg/dL 0.89 0.83 1.03(H)  Sodium 135 - 145 mmol/L 140 140 136  Potassium 3.5 - 5.1 mmol/L 2.9(LL) 3.9 5.6(H)  Chloride 98 - 111 mmol/L 96(L) 98 101  CO2 22 - 32 mmol/L 32 32 27  Calcium 8.9 - 10.3 mg/dL 6.9(L) 8.0(L) 8.7(L)  Total Protein 6.5 - 8.1 g/dL 6.4(L) 6.5 -  Total Bilirubin 0.3 - 1.2 mg/dL 0.4 0.3 -  Alkaline Phos 38 - 126 U/L 143(H) 110 -  AST 15 - 41 U/L 16 11(L) -  ALT 0 - 44 U/L 6 6 -    Lab Results  Component Value Date   WBC 6.1 03/14/2019   HGB 8.5 (L) 03/14/2019   HCT 28.4 (L) 03/14/2019   MCV 82.1 03/14/2019   PLT 183 03/14/2019   NEUTROABS 4.5 03/14/2019    ASSESSMENT & PLAN:  Malignant neoplasm of upper-outer quadrant of left breast in female, estrogen receptor positive ([place]) 07/09/2018:Screening mammogram detected spiculated mass in the left breast upper outer quadrant, 2.6 cm by MRI, axilla negative, 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 Neoadjuvant chemotherapy withTaxol weekly x8Herceptin Perjeta  (Perjeta discontinued due to diarrhea)every 3 weeksfollowed by Kadcylamaintenance for 1 year 2. 12/10/2018:Left lumpectomy ([facility]): IDC, 1.5cm, grade 2, HER-2 negative, ER+ 100%, PR+ 80%, Ki67 5%, clear margins, 2 lymph nodes negative for carcinoma. 3. Followed by adjuvant radiation therapy started 01/21/2019 4.Followed by adjuvant antiestrogen therapy ------------------------------------------------------------------------------------------------------------------------------------- Current treatment: Kadcyla maintenance started 01/31/2019 Kadcyla toxicities: No major side effects to Kadcyla. We reduced the dosage of Kadcyla because of anemia. Chemotherapy-induced anemia causing her fatigue, today's hemoglobin is 8.5 and is stable.  8/18: adjuvant radiation 8/28: Kadcyla  Treatment: Kadcyla Echocardiogram: 02/11/2019, EF 60 to 65% If she continues to decline in the performance status  then we will discontinue Kadcyla.  I am going to discuss it with each visit and then make a determination.  She is fine to proceed with today's treatment.  Hypokalemia: Currently on potassium twice a day.   Return to clinic in 3 weeks for next dose.    No orders of the defined types were placed in this encounter.  The patient has a good understanding of the overall plan. she agrees with it. she will call with any problems that may develop before the next visit here.  Nicholas Lose, MD 03/14/2019  Julious Oka Dorshimer am acting as scribe for Dr. Nicholas Lose.  I have reviewed the above documentation for accuracy and completeness, and I agree with the above.

## 2019-03-14 ENCOUNTER — Inpatient Hospital Stay (HOSPITAL_BASED_OUTPATIENT_CLINIC_OR_DEPARTMENT_OTHER): Payer: Medicare Other | Admitting: Hematology and Oncology

## 2019-03-14 ENCOUNTER — Inpatient Hospital Stay: Payer: Medicare Other

## 2019-03-14 ENCOUNTER — Other Ambulatory Visit: Payer: Self-pay

## 2019-03-14 ENCOUNTER — Inpatient Hospital Stay: Payer: Medicare Other | Attending: Hematology and Oncology

## 2019-03-14 VITALS — BP 147/75 | HR 99 | Temp 98.5°F | Resp 18

## 2019-03-14 DIAGNOSIS — Z923 Personal history of irradiation: Secondary | ICD-10-CM | POA: Diagnosis not present

## 2019-03-14 DIAGNOSIS — C50412 Malignant neoplasm of upper-outer quadrant of left female breast: Secondary | ICD-10-CM

## 2019-03-14 DIAGNOSIS — Z7901 Long term (current) use of anticoagulants: Secondary | ICD-10-CM | POA: Insufficient documentation

## 2019-03-14 DIAGNOSIS — E119 Type 2 diabetes mellitus without complications: Secondary | ICD-10-CM | POA: Diagnosis not present

## 2019-03-14 DIAGNOSIS — Z79899 Other long term (current) drug therapy: Secondary | ICD-10-CM | POA: Diagnosis not present

## 2019-03-14 DIAGNOSIS — Z170 Estrogen receptor positive status [ER+]: Secondary | ICD-10-CM | POA: Diagnosis not present

## 2019-03-14 DIAGNOSIS — E785 Hyperlipidemia, unspecified: Secondary | ICD-10-CM | POA: Insufficient documentation

## 2019-03-14 DIAGNOSIS — E876 Hypokalemia: Secondary | ICD-10-CM | POA: Insufficient documentation

## 2019-03-14 DIAGNOSIS — Z794 Long term (current) use of insulin: Secondary | ICD-10-CM | POA: Insufficient documentation

## 2019-03-14 DIAGNOSIS — Z95828 Presence of other vascular implants and grafts: Secondary | ICD-10-CM

## 2019-03-14 DIAGNOSIS — Z5112 Encounter for antineoplastic immunotherapy: Secondary | ICD-10-CM | POA: Diagnosis not present

## 2019-03-14 LAB — CBC WITH DIFFERENTIAL (CANCER CENTER ONLY)
Abs Immature Granulocytes: 0.01 10*3/uL (ref 0.00–0.07)
Basophils Absolute: 0.1 10*3/uL (ref 0.0–0.1)
Basophils Relative: 1 %
Eosinophils Absolute: 0.2 10*3/uL (ref 0.0–0.5)
Eosinophils Relative: 3 %
HCT: 28.4 % — ABNORMAL LOW (ref 36.0–46.0)
Hemoglobin: 8.5 g/dL — ABNORMAL LOW (ref 12.0–15.0)
Immature Granulocytes: 0 %
Lymphocytes Relative: 12 %
Lymphs Abs: 0.7 10*3/uL (ref 0.7–4.0)
MCH: 24.6 pg — ABNORMAL LOW (ref 26.0–34.0)
MCHC: 29.9 g/dL — ABNORMAL LOW (ref 30.0–36.0)
MCV: 82.1 fL (ref 80.0–100.0)
Monocytes Absolute: 0.6 10*3/uL (ref 0.1–1.0)
Monocytes Relative: 10 %
Neutro Abs: 4.5 10*3/uL (ref 1.7–7.7)
Neutrophils Relative %: 74 %
Platelet Count: 183 10*3/uL (ref 150–400)
RBC: 3.46 MIL/uL — ABNORMAL LOW (ref 3.87–5.11)
RDW: 18.6 % — ABNORMAL HIGH (ref 11.5–15.5)
WBC Count: 6.1 10*3/uL (ref 4.0–10.5)
nRBC: 0 % (ref 0.0–0.2)

## 2019-03-14 LAB — CMP (CANCER CENTER ONLY)
ALT: 8 U/L (ref 0–44)
AST: 14 U/L — ABNORMAL LOW (ref 15–41)
Albumin: 2.7 g/dL — ABNORMAL LOW (ref 3.5–5.0)
Alkaline Phosphatase: 153 U/L — ABNORMAL HIGH (ref 38–126)
Anion gap: 11 (ref 5–15)
BUN: 19 mg/dL (ref 8–23)
CO2: 26 mmol/L (ref 22–32)
Calcium: 8.9 mg/dL (ref 8.9–10.3)
Chloride: 103 mmol/L (ref 98–111)
Creatinine: 0.9 mg/dL (ref 0.44–1.00)
GFR, Est AFR Am: >60 mL/min (ref >60)
GFR, Estimated: >60 mL/min (ref >60)
Glucose, Bld: 210 mg/dL — ABNORMAL HIGH (ref 70–99)
Potassium: 4.1 mmol/L (ref 3.5–5.1)
Sodium: 140 mmol/L (ref 135–145)
Total Bilirubin: 0.4 mg/dL (ref 0.3–1.2)
Total Protein: 6.7 g/dL (ref 6.5–8.1)

## 2019-03-14 MED ORDER — DIPHENHYDRAMINE HCL 25 MG PO CAPS
50.0000 mg | ORAL_CAPSULE | Freq: Once | ORAL | Status: AC
  Administered 2019-03-14: 10:00:00 50 mg via ORAL

## 2019-03-14 MED ORDER — SODIUM CHLORIDE 0.9 % IV SOLN
Freq: Once | INTRAVENOUS | Status: AC
  Administered 2019-03-14: 09:00:00 via INTRAVENOUS
  Filled 2019-03-14: qty 250

## 2019-03-14 MED ORDER — HEPARIN SOD (PORK) LOCK FLUSH 100 UNIT/ML IV SOLN
500.0000 [IU] | Freq: Once | INTRAVENOUS | Status: AC | PRN
  Administered 2019-03-14: 500 [IU]
  Filled 2019-03-14: qty 5

## 2019-03-14 MED ORDER — ACETAMINOPHEN 325 MG PO TABS
650.0000 mg | ORAL_TABLET | Freq: Once | ORAL | Status: AC
  Administered 2019-03-14: 10:00:00 650 mg via ORAL

## 2019-03-14 MED ORDER — SODIUM CHLORIDE 0.9% FLUSH
10.0000 mL | INTRAVENOUS | Status: DC | PRN
  Administered 2019-03-14: 11:00:00 10 mL
  Filled 2019-03-14: qty 10

## 2019-03-14 MED ORDER — SODIUM CHLORIDE 0.9% FLUSH
10.0000 mL | Freq: Once | INTRAVENOUS | Status: AC
  Administered 2019-03-14: 08:00:00 10 mL
  Filled 2019-03-14: qty 10

## 2019-03-14 MED ORDER — DIPHENHYDRAMINE HCL 25 MG PO CAPS
ORAL_CAPSULE | ORAL | Status: AC
  Filled 2019-03-14: qty 2

## 2019-03-14 MED ORDER — SODIUM CHLORIDE 0.9 % IV SOLN
2.9000 mg/kg | Freq: Once | INTRAVENOUS | Status: AC
  Administered 2019-03-14: 10:00:00 360 mg via INTRAVENOUS
  Filled 2019-03-14: qty 8

## 2019-03-14 MED ORDER — ACETAMINOPHEN 325 MG PO TABS
ORAL_TABLET | ORAL | Status: AC
  Filled 2019-03-14: qty 2

## 2019-03-14 NOTE — Patient Instructions (Signed)
[facility] Discharge Instructions for Patients Receiving Chemotherapy  Today you received the following chemotherapy agent: Ado-Trastuzumab Emtansine Illene Bolus).  To help prevent nausea and vomiting after your treatment, we encourage you to take your nausea medication.   If you develop nausea and vomiting that is not controlled by your nausea medication, call the clinic.   BELOW ARE SYMPTOMS THAT SHOULD BE REPORTED IMMEDIATELY:  *FEVER GREATER THAN 100.5 F  *CHILLS WITH OR WITHOUT FEVER  NAUSEA AND VOMITING THAT IS NOT CONTROLLED WITH YOUR NAUSEA MEDICATION  *UNUSUAL SHORTNESS OF BREATH  *UNUSUAL BRUISING OR BLEEDING  TENDERNESS IN MOUTH AND THROAT WITH OR WITHOUT PRESENCE OF ULCERS  *URINARY PROBLEMS  *BOWEL PROBLEMS  UNUSUAL RASH Items with * indicate a potential emergency and should be followed up as soon as possible.  Feel free to call the clinic should you have any questions or concerns. The clinic phone number is [phone].  Please show the [facility] at check-in to the Emergency Department and triage nurse.  Ado-Trastuzumab Emtansine for injection What is this medicine? ADO-TRASTUZUMAB EMTANSINE (ADD oh traz TOO zuh mab em TAN zine) is a monoclonal antibody combined with chemotherapy. It is used to treat breast cancer. This medicine may be used for other purposes; ask your health care provider or pharmacist if you have questions. COMMON BRAND NAME(S): Kadcyla What should I tell my health care provider before I take this medicine? They need to know if you have any of these conditions:  heart disease  heart failure  infection (especially a virus infection such as chickenpox, cold sores, or herpes)  liver disease  lung or breathing disease, like asthma  tingling of the fingers or toes, or other nerve disorder  an unusual or allergic reaction to ado-trastuzumab emtansine, other medications, foods, dyes, or preservatives  pregnant or  trying to get pregnant  breast-feeding How should I use this medicine? This medicine is for infusion into a vein. It is given by a health care professional in a hospital or clinic setting. Talk to your pediatrician regarding the use of this medicine in children. Special care may be needed. Overdosage: If you think you have taken too much of this medicine contact a poison control center or emergency room at once. NOTE: This medicine is only for you. Do not share this medicine with others. What if I miss a dose? It is important not to miss your dose. Call your doctor or health care professional if you are unable to keep an appointment. What may interact with this medicine? This medicine may also interact with the following medications:  atazanavir  boceprevir  clarithromycin  delavirdine  indinavir  dalfopristin; quinupristin  isoniazid, INH  itraconazole  ketoconazole  nefazodone  nelfinavir  ritonavir  telaprevir  telithromycin  tipranavir  voriconazole This list may not describe all possible interactions. Give your health care provider a list of all the medicines, herbs, non-prescription drugs, or dietary supplements you use. Also tell them if you smoke, drink alcohol, or use illegal drugs. Some items may interact with your medicine. What should I watch for while using this medicine? Visit your doctor for checks on your progress. This drug may make you feel generally unwell. This is not uncommon, as chemotherapy can affect healthy cells as well as cancer cells. Report any side effects. Continue your course of treatment even though you feel ill unless your doctor tells you to stop. You may need blood work done while you are taking this medicine.  Call your doctor or health care professional for advice if you get a fever, chills or sore throat, or other symptoms of a cold or flu. Do not treat yourself. This drug decreases your body's ability to fight infections. Try to  avoid being around people who are sick. Be careful brushing and flossing your teeth or using a toothpick because you may get an infection or bleed more easily. If you have any dental work done, tell your dentist you are receiving this medicine. Avoid taking products that contain aspirin, acetaminophen, ibuprofen, naproxen, or ketoprofen unless instructed by your doctor. These medicines may hide a fever. Do not become pregnant while taking this medicine or for 7 months after stopping it, men with female partners should use contraception during treatment and for 4 months after the last dose. Women should inform their doctor if they wish to become pregnant or think they might be pregnant. There is a potential for serious side effects to an unborn child. Do not breast-feed an infant while taking this medicine or for 7 months after the last dose. Men who have a partner who is pregnant or who is capable of becoming pregnant should use a condom during sexual activity while taking this medicine and for 4 months after stopping it. Men should inform their doctors if they wish to father a child. This medicine may lower sperm counts. Talk to your health care professional or pharmacist for more information. What side effects may I notice from receiving this medicine? Side effects that you should report to your doctor or health care professional as soon as possible:  allergic reactions like skin rash, itching or hives, swelling of the face, lips, or tongue  breathing problems  chest pain or palpitations  fever or chills, sore throat  general ill feeling or flu-like symptoms  light-colored stools  nausea, vomiting  pain, tingling, numbness in the hands or feet  signs and symptoms of bleeding such as bloody or black, tarry stools; red or dark-brown urine; spitting up blood or brown material that looks like coffee grounds; red spots on the skin; unusual bruising or bleeding from the eye, gums, or nose   swelling of the legs or ankles  yellowing of the eyes or skin Side effects that usually do not require medical attention (report to your doctor or health care professional if they continue or are bothersome):  changes in taste  constipation  dizziness  headache  joint pain  muscle pain  trouble sleeping  unusually weak or tired This list may not describe all possible side effects. Call your doctor for medical advice about side effects. You may report side effects to FDA at 1-800-FDA-1088. Where should I keep my medicine? This drug is given in a hospital or clinic and will not be stored at home. NOTE: This sheet is a summary. It may not cover all possible information. If you have questions about this medicine, talk to your doctor, pharmacist, or health care provider.  2020 Elsevier/Gold Standard (2017-10-19 10:03:15)  Coronavirus (COVID-19) Are you at risk?  Are you at risk for the Coronavirus (COVID-19)?  To be considered HIGH RISK for Coronavirus (COVID-19), you have to meet the following criteria:  . Traveled to [place], [place], [place], [place] or [place]; or in the [place] to [place], [place], [place], or [place]; and have fever, cough, and shortness of breath within the last 2 weeks of travel OR . Been in close contact with a person diagnosed with COVID-19 within  the last 2 weeks and have fever, cough, and shortness of breath . IF YOU DO NOT MEET THESE CRITERIA, YOU ARE CONSIDERED LOW RISK FOR COVID-19.  What to do if you are HIGH RISK for COVID-19?  Marland Kitchen If you are having a medical emergency, call 911. . Seek medical care right away. Before you go to a doctor's office, urgent care or emergency department, call ahead and tell them about your recent travel, contact with someone diagnosed with COVID-19, and your symptoms. You should receive instructions from your physician's office regarding next steps of care.  . When you arrive at healthcare provider, tell the  healthcare staff immediately you have returned from visiting [place], [place], [place], [place] or [place]; or traveled in the [place] to [place], [place], [place], or [place]; in the last two weeks or you have been in close contact with a person diagnosed with COVID-19 in the last 2 weeks.   . Tell the health care staff about your symptoms: fever, cough and shortness of breath. . After you have been seen by a medical provider, you will be either: o Tested for (COVID-19) and discharged home on quarantine except to seek medical care if symptoms worsen, and asked to  - Stay home and avoid contact with others until you get your results (4-5 days)  - Avoid travel on public transportation if possible (such as bus, train, or airplane) or o Sent to the Emergency Department by EMS for evaluation, COVID-19 testing, and possible admission depending on your condition and test results.  What to do if you are LOW RISK for COVID-19?  Reduce your risk of any infection by using the same precautions used for avoiding the common cold or flu:  Marland Kitchen Wash your hands often with soap and warm water for at least 20 seconds.  If soap and water are not readily available, use an alcohol-based hand sanitizer with at least 60% alcohol.  . If coughing or sneezing, cover your mouth and nose by coughing or sneezing into the elbow areas of your shirt or coat, into a tissue or into your sleeve (not your hands). . Avoid shaking hands with others and consider head nods or verbal greetings only. . Avoid touching your eyes, nose, or mouth with unwashed hands.  . Avoid close contact with people who are sick. . Avoid places or events with large numbers of people in one location, like concerts or sporting events. . Carefully consider travel plans you have or are making. . If you are planning any travel outside or inside the [place], visit the CDC's Travelers' Health webpage for the latest health notices. . If you have some symptoms  but not all symptoms, continue to monitor at home and seek medical attention if your symptoms worsen. . If you are having a medical emergency, call 911.   [place] / e-Visit: [email or web address]         [facility]: [facility]: [phone]                   [facility]: [phone]. Hypokalemia Hypokalemia means that the amount of potassium in the blood is lower than normal. Potassium is a chemical (electrolyte) that helps regulate the amount of fluid in the body. It also stimulates muscle tightening (contraction) and helps nerves work properly. Normally, most of the body's potassium is inside cells, and only a very small amount is  in the blood. Because the amount in the blood is so small, minor changes to potassium levels in the blood can be life-threatening. What are the causes? This condition may be caused by:  Antibiotic medicine.  Diarrhea or vomiting. Taking too much of a medicine that helps you have a bowel movement (laxative) can cause diarrhea and lead to hypokalemia.  Chronic kidney disease (CKD).  Medicines that help the body get rid of excess fluid (diuretics).  Eating disorders, such as bulimia.  Low magnesium levels in the body.  Sweating a lot. What are the signs or symptoms? Symptoms of this condition include:  Weakness.  Constipation.  Fatigue.  Muscle cramps.  Mental confusion.  Skipped heartbeats or irregular heartbeat (palpitations).  Tingling or numbness. How is this diagnosed? This condition is diagnosed with a blood test. How is this treated? This condition may be treated by:  Taking potassium supplements by mouth.  Adjusting the medicines that you take.  Eating more foods that contain a lot of potassium. If your potassium level is very low, you may need to get potassium through an IV and  be monitored in the hospital. Follow these instructions at home:   Take over-the-counter and prescription medicines only as told by your health care provider. This includes vitamins and supplements.  Eat a healthy diet. A healthy diet includes fresh fruits and vegetables, whole grains, healthy fats, and lean proteins.  If instructed, eat more foods that contain a lot of potassium. This includes: ? Nuts, such as peanuts and pistachios. ? Seeds, such as sunflower seeds and pumpkin seeds. ? Peas, lentils, and lima beans. ? Whole grain and bran cereals and breads. ? Fresh fruits and vegetables, such as apricots, avocado, bananas, cantaloupe, kiwi, oranges, tomatoes, asparagus, and potatoes. ? Orange juice. ? Tomato juice. ? Red meats. ? Yogurt.  Keep all follow-up visits as told by your health care provider. This is important. Contact a health care provider if you:  Have weakness that gets worse.  Feel your heart pounding or racing.  Vomit.  Have diarrhea.  Have diabetes (diabetes mellitus) and you have trouble keeping your blood sugar (glucose) in your target range. Get help right away if you:  Have chest pain.  Have shortness of breath.  Have vomiting or diarrhea that lasts for more than 2 days.  Faint. Summary  Hypokalemia means that the amount of potassium in the blood is lower than normal.  This condition is diagnosed with a blood test.  Hypokalemia may be treated by taking potassium supplements, adjusting the medicines that you take, or eating more foods that are high in potassium.  If your potassium level is very low, you may need to get potassium through an IV and be monitored in the hospital. This information is not intended to replace advice given to you by your health care provider. Make sure you discuss any questions you have with your health care provider. Document Released: 05/22/2005 Document Revised: 01/02/2018 Document Reviewed: 01/02/2018 Elsevier  Patient Education  2020 [employer].

## 2019-03-14 NOTE — Assessment & Plan Note (Signed)
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 Neoadjuvant chemotherapy withTaxol weekly x8Herceptin Perjeta (Perjeta discontinued due to diarrhea)every 3 weeksfollowed by Kadcylamaintenance for 1 year 2. 12/10/2018:Left lumpectomy ([facility]): IDC, 1.5cm, grade 2, HER-2 negative, ER+ 100%, PR+ 80%, Ki67 5%, clear margins, 2 lymph nodes negative for carcinoma. 3. Followed by adjuvant radiation therapy started 01/21/2019 4.Followed by adjuvant antiestrogen therapy ------------------------------------------------------------------------------------------------------------------------------------- Current treatment: Kadcyla maintenance started 01/31/2019 Kadcyla toxicities: No major side effects to Kadcyla. We will reduce the dosage of Kadcyla because of anemia. 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 Return to clinic every 3 weeks for Kadcyla and follow-up.  8/18: adjuvant radiation 8/28: Kadcyla  Treatment: Kadcyla Echocardiogram: 02/11/2019, EF 60 to 65% If she continues to decline in the performance status then we will discontinue Kadcyla.  Hypokalemia: Currently on potassium twice a day.   Return to clinic in 3 weeks for next dose.

## 2019-03-17 ENCOUNTER — Telehealth: Payer: Self-pay | Admitting: Hematology and Oncology

## 2019-03-17 NOTE — Telephone Encounter (Signed)
NO LOS

## 2019-03-18 ENCOUNTER — Other Ambulatory Visit: Payer: Self-pay | Admitting: Hematology and Oncology

## 2019-03-19 DIAGNOSIS — I890 Lymphedema, not elsewhere classified: Secondary | ICD-10-CM | POA: Diagnosis not present

## 2019-03-19 DIAGNOSIS — I130 Hypertensive heart and chronic kidney disease with heart failure and stage 1 through stage 4 chronic kidney disease, or unspecified chronic kidney disease: Secondary | ICD-10-CM | POA: Diagnosis not present

## 2019-03-19 DIAGNOSIS — E1122 Type 2 diabetes mellitus with diabetic chronic kidney disease: Secondary | ICD-10-CM | POA: Diagnosis not present

## 2019-03-19 DIAGNOSIS — I5033 Acute on chronic diastolic (congestive) heart failure: Secondary | ICD-10-CM | POA: Diagnosis not present

## 2019-03-19 DIAGNOSIS — N189 Chronic kidney disease, unspecified: Secondary | ICD-10-CM | POA: Diagnosis not present

## 2019-03-19 DIAGNOSIS — I872 Venous insufficiency (chronic) (peripheral): Secondary | ICD-10-CM | POA: Diagnosis not present

## 2019-03-19 MED ORDER — POTASSIUM CHLORIDE CRYS ER 20 MEQ PO TBCR: 20.0000 meq | EXTENDED_RELEASE_TABLET | Freq: Two times a day (BID) | ORAL | 0 refills | Status: DC

## 2019-03-19 NOTE — Progress Notes (Signed)
I called the patient today about her upcoming follow-up appointment in radiation oncology.   Given concerns about the COVID-19 pandemic, I offered a phone assessment with the patient to determine if coming to the clinic was necessary. She accepted.  I let the patient know that I had spoken with Dr. Isidore Moos, and she wanted them to know the importance of washing their hands for at least 20 seconds at a time, especially after going out in public, and before they eat.  Limit going out in public whenever possible. Do not touch your face, unless your hands are clean, such as when bathing. Get plenty of rest, eat well, and stay hydrated.   The patient denies any symptomatic concerns.  Specifically, they report good healing of their skin in the radiation fields.  Skin is intact at radiation site.   I recommended that she continue skin care by applying oil or lotion with vitamin E to the skin in the radiation fields, BID, for 2 more months.  Continue follow-up with medical oncology - follow-up is scheduled on 04/04/19 for infusion and to see Dr. Lindi Adie .  I explained that yearly mammograms are important for patients with intact breast tissue, and physical exams are important after mastectomy for patients that cannot undergo mammography.  I encouraged her to call if she had further questions or concerns about her healing. Otherwise, she will follow-up PRN in radiation oncology. Patient is pleased with this plan, and we will cancel her upcoming follow-up to reduce the risk of COVID-19 transmission.

## 2019-03-20 ENCOUNTER — Other Ambulatory Visit: Payer: Self-pay

## 2019-03-20 MED ORDER — POTASSIUM CHLORIDE CRYS ER 20 MEQ PO TBCR: 20.0000 meq | EXTENDED_RELEASE_TABLET | Freq: Two times a day (BID) | ORAL | 2 refills | Status: DC

## 2019-03-21 ENCOUNTER — Ambulatory Visit
Admission: RE | Admit: 2019-03-21 | Discharge: 2019-03-21 | Disposition: A | Discharge status: Home / Self Care | Payer: Medicare Other | Source: Ambulatory Visit | Attending: Radiation Oncology | Admitting: Radiation Oncology

## 2019-03-22 DIAGNOSIS — I272 Pulmonary hypertension, unspecified: Secondary | ICD-10-CM | POA: Diagnosis not present

## 2019-03-22 DIAGNOSIS — E1122 Type 2 diabetes mellitus with diabetic chronic kidney disease: Secondary | ICD-10-CM | POA: Diagnosis not present

## 2019-03-22 DIAGNOSIS — F419 Anxiety disorder, unspecified: Secondary | ICD-10-CM | POA: Diagnosis not present

## 2019-03-22 DIAGNOSIS — C50912 Malignant neoplasm of unspecified site of left female breast: Secondary | ICD-10-CM | POA: Diagnosis not present

## 2019-03-22 DIAGNOSIS — I5033 Acute on chronic diastolic (congestive) heart failure: Secondary | ICD-10-CM | POA: Diagnosis not present

## 2019-03-22 DIAGNOSIS — N189 Chronic kidney disease, unspecified: Secondary | ICD-10-CM | POA: Diagnosis not present

## 2019-03-22 DIAGNOSIS — I4891 Unspecified atrial fibrillation: Secondary | ICD-10-CM | POA: Diagnosis not present

## 2019-03-22 DIAGNOSIS — I130 Hypertensive heart and chronic kidney disease with heart failure and stage 1 through stage 4 chronic kidney disease, or unspecified chronic kidney disease: Secondary | ICD-10-CM | POA: Diagnosis not present

## 2019-03-22 DIAGNOSIS — I890 Lymphedema, not elsewhere classified: Secondary | ICD-10-CM | POA: Diagnosis not present

## 2019-03-22 DIAGNOSIS — I872 Venous insufficiency (chronic) (peripheral): Secondary | ICD-10-CM | POA: Diagnosis not present

## 2019-03-25 ENCOUNTER — Encounter: Payer: Self-pay | Admitting: *Deleted

## 2019-03-25 DIAGNOSIS — I890 Lymphedema, not elsewhere classified: Secondary | ICD-10-CM | POA: Diagnosis not present

## 2019-03-25 DIAGNOSIS — E1122 Type 2 diabetes mellitus with diabetic chronic kidney disease: Secondary | ICD-10-CM | POA: Diagnosis not present

## 2019-03-25 DIAGNOSIS — I130 Hypertensive heart and chronic kidney disease with heart failure and stage 1 through stage 4 chronic kidney disease, or unspecified chronic kidney disease: Secondary | ICD-10-CM | POA: Diagnosis not present

## 2019-03-25 DIAGNOSIS — N189 Chronic kidney disease, unspecified: Secondary | ICD-10-CM | POA: Diagnosis not present

## 2019-03-25 DIAGNOSIS — I5033 Acute on chronic diastolic (congestive) heart failure: Secondary | ICD-10-CM | POA: Diagnosis not present

## 2019-03-25 DIAGNOSIS — I872 Venous insufficiency (chronic) (peripheral): Secondary | ICD-10-CM | POA: Diagnosis not present

## 2019-03-25 NOTE — Progress Notes (Signed)
[place] [employer] representative left patient a voicemail encouraging her to return/complete patient portion of financial assistance applications.  CSW left contact information and encouraged patient to call with questions or concerns.    Johnnye Lana, MSW, LCSW, OSW-C Clinical Social Worker [facility] [phone]

## 2019-03-26 ENCOUNTER — Other Ambulatory Visit: Payer: Self-pay

## 2019-03-26 ENCOUNTER — Telehealth: Payer: Medicare Other | Admitting: Cardiology

## 2019-03-26 NOTE — Progress Notes (Unsigned)
{Choose 1 Note Type (Telehealth Visit or Telephone Visit):[phone]}   Date:  03/26/2019   ID:  Destiny Nash, DOB [birth year], MRN [id number]  {Patient [phone]::"Home"} {Provider Location:[phone]::"Home"}  PCP:  [name], MD  Cardiologist:  Carlyle Dolly, MD *** Electrophysiologist:  None   Evaluation Performed:  {Choose Visit Type:[phone]::"Follow-Up Visit"}  Chief Complaint:  ***  History of Present Illness:    Destiny Nash is a 73 y.o. female seen today for follow up of the following medical problems.  1. Afib - new diagnosis during 02/2018 admission - resistant to cardiversion attempt   -denies any significant palpitations. Tolerating anticoag without issues.     2. Pulmonary HTN - 02/2018 echo PASP 65. Biatrial enlargement would suggests some diasotlic dysfunction though it was indeterminant by the study, LVEF was 55-60%. - 07/2018 echo PASP not reported, a reported RVSP is 28 (TR peak frad 13, RA pressure 15), unclear accuracy as this is reported only in the body of the report - ANA neg, HIV neg, LFTs normal   - needs sleep study, PFTs - she was resistant to sleep study at last visit  - wants to hold off on sleep study or any further evaluation until after her breast cancer treatment.   3. Breast cancer - needs echo every 3 months as requested by her oncologist. Last echo 07/2018 LVEF 60-65%, normal RV function,  - she is on herceptic, taxol, pertuzumab   4. HTN - remains compliant with meds   5. LE edema/Lymphedema/Acute on chronic Diastolic HF/Pleural effusions - seen by vascular, thought to be lymphedema and venous insufficiency as a major contributor to her LE edema - imaging however has also showed signs of pulmonary edema and pleural effusion consistent with diastolic HF though fairly benign BNP last month, though can be misleading in diastolic HF and obesity   - Last visit we increased lasix to 43m  daily - follow up labs have been stable, frequently checked at cancer center   - during 8/18 visit with PA Strader lasix was increased to 465mdaily. Was also to have home health Unna boots placed.  - repeat BMET showed Cr actually improved from last month.  02/2019 echo LVEF 60-65%, indeterminate diastolic function. Normal RV function. Dilated IVC, PASP is 38 mmhg.Reported GLS 13.5% up from prior study   - takes at least 407maily, at times will take 36m62m - reports swelling in legs is improving - breathing is improving.     6. 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    Past Medical History:  Diagnosis Date  . Anemia    in the past   . Anxiety   . Atrial fibrillation (HCC)[place] a. diagnosed in 02/2018.  . Bronchitis    easily  develops bronchitis when she has a cold  . Cancer (HCC)[place] breast - [place], Feb. 4th   . Cellulitis   . Chronic kidney disease   . Diabetes mellitus without complication (HCC)[place] type 2  . Dyspnea   . Dysrhythmia    afib, treating with Eliquis  . Family history of breast cancer   . Family history of uterine cancer   . Heart murmur   . Hyperlipidemia   . Hypertension  Past Surgical History:  Procedure Laterality Date  . BREAST LUMPECTOMY WITH RADIOACTIVE SEED AND SENTINEL LYMPH NODE BIOPSY Left 12/10/2018   Procedure: LEFT BREAST LUMPECTOMY WITH RADIOACTIVE SEED AND LEFT SENTINEL LYMPH NODE MAPPING;  Surgeon: Erroll Luna, MD;  Location: [facility];  Service: General;  Laterality: Left;  . DILATION AND CURETTAGE OF UTERUS     x2 on same day  . PORTACATH PLACEMENT N/A 08/01/2018   Procedure: INSERTION PORT-A-CATH WITH ULTRASOUND;  Surgeon: Erroll Luna, MD;  Location: [facility];   Service: General;  Laterality: N/A;  . TONSILLECTOMY AND ADENOIDECTOMY       No outpatient medications have been marked as taking for the 03/26/19 encounter (Appointment) with Arnoldo Lenis, MD.     Allergies:   Levaquin [levofloxacin] and Codeine   Social History   Tobacco Use  . Smoking status: Never Smoker  . Smokeless tobacco: Never Used  Substance Use Topics  . Alcohol use: Not Currently  . Drug use: Never     Family Hx: The patient's family history includes Breast cancer in her cousin and cousin; CAD in her brother; Hyperlipidemia in her mother; Hypertension in her brother and mother; Uterine cancer in her maternal grandmother.  ROS:   Please see the history of present illness.    *** All other systems reviewed and are negative.   Prior CV studies:   The following studies were reviewed today:  ***  Labs/Other Tests and Data Reviewed:    EKG:  {EKG/Telemetry Strips Reviewed:[phone]}  Recent Labs: 01/15/2019: B Natriuretic Peptide 71.0 03/14/2019: ALT 8; BUN 19; Creatinine 0.90; Hemoglobin 8.5; Platelet Count 183; Potassium 4.1; Sodium 140   Recent Lipid Panel No results found for: CHOL, TRIG, HDL, CHOLHDL, LDLCALC, LDLDIRECT  Wt Readings from Last 3 Encounters:  02/21/19 261 lb 9.6 oz (118.7 kg)  01/15/19 277 lb (125.6 kg)  01/08/19 276 lb (125.2 kg)     Objective:    Vital Signs:  There were no vitals taken for this visit.   {[facility] Virtual Exam (Optional):[phone]::"VITAL SIGNS:  reviewed"}  ASSESSMENT & PLAN:    1. Afib - no symptoms, continue current meds   2. Acute on chronic diastolic HF - symptoms improving slowly since increasing lasix to 88m daily. Difficult to assess her progress as she is not able to stand for a weight, has severe chronic LE edema related to lymphedema and venous insufficiency. Symptomatically symptoms are improving and subjectively she feels swellign is improving some - increase lasix to 67mdaily, repeat  BMET/Mg in 2 weeks.   3. HTN - at goal, continue current meds  4. Lymphedema - per vascular, at last PA visit referal was placed for home health unna boot but does not appear family was contacted, I have asked nursing to look into this  F/u 6 weeks to reassess volume status.   COVID-19 Education: The signs and symptoms of COVID-19 were discussed with the patient and how to seek care for testing (follow up with PCP or arrange E-visit).  ***The importance of social distancing was discussed today.  Time:   Today, I have spent *** minutes with the patient with telehealth technology discussing the above problems.     Medication Adjustments/Labs and Tests Ordered: Current medicines are reviewed at length with the patient today.  Concerns regarding medicines are outlined above.   Tests Ordered: No orders of the defined types were placed in this encounter.   Medication Changes: No orders of the defined types were placed in this  encounter.   Follow Up:  {F/U Format:[id number]} {follow up:15908}  Signed, Carlyle Dolly, MD  03/26/2019 11:31 AM    [facility]

## 2019-03-27 DIAGNOSIS — Z23 Encounter for immunization: Secondary | ICD-10-CM | POA: Diagnosis not present

## 2019-03-31 ENCOUNTER — Encounter: Payer: Self-pay | Admitting: Radiation Oncology

## 2019-03-31 NOTE — Progress Notes (Signed)
Patient Name: Destiny Nash MRN: [id number] DOB: [birth year] Referring Physician: Nicholas Lose (Profile Not Attached) Date of Service: 02/18/2019 [facility], [place]                                                        End Of Treatment Note  Diagnoses: C50.412-Malignant neoplasm of upper-outer quadrant of left female breast  Cancer Staging:   Cancer Staging Malignant neoplasm of upper-outer quadrant of left breast in female, estrogen receptor positive ([place]) Staging form: Breast, AJCC 8th Edition - Clinical stage from 07/25/2018: Stage IB (cT2, cN0, cM0, G2, ER+, PR+, HER2+) - Signed by Nicholas Lose, MD on 07/25/2018 - Pathologic stage from 12/10/2018: No Stage Recommended (ypT1c, pN0, cM0, G2, ER+, PR+, HER2-) - Signed by Gardenia Phlegm, NP on 12/18/2018  Intent: Curative  Radiation Treatment Dates: 01/20/2019 through 02/18/2019 Site Technique Total Dose Dose per Fx Completed Fx Beam Energies  Breast: Breast_Lt 3D 40.05/40.05 2.67 15/15 6X, 10X  Breast: Breast_Lt_Bst 3D 10/10 2 5/5 6X   Narrative: The patient tolerated radiation therapy relatively well.   Plan: The patient will follow-up with radiation oncology in 1 mo . -----------------------------------  Eppie Gibson, MD

## 2019-04-02 DIAGNOSIS — E1122 Type 2 diabetes mellitus with diabetic chronic kidney disease: Secondary | ICD-10-CM | POA: Diagnosis not present

## 2019-04-02 DIAGNOSIS — I5033 Acute on chronic diastolic (congestive) heart failure: Secondary | ICD-10-CM | POA: Diagnosis not present

## 2019-04-02 DIAGNOSIS — I130 Hypertensive heart and chronic kidney disease with heart failure and stage 1 through stage 4 chronic kidney disease, or unspecified chronic kidney disease: Secondary | ICD-10-CM | POA: Diagnosis not present

## 2019-04-02 DIAGNOSIS — N390 Urinary tract infection, site not specified: Secondary | ICD-10-CM | POA: Diagnosis not present

## 2019-04-02 DIAGNOSIS — I872 Venous insufficiency (chronic) (peripheral): Secondary | ICD-10-CM | POA: Diagnosis not present

## 2019-04-02 DIAGNOSIS — I890 Lymphedema, not elsewhere classified: Secondary | ICD-10-CM | POA: Diagnosis not present

## 2019-04-02 DIAGNOSIS — N189 Chronic kidney disease, unspecified: Secondary | ICD-10-CM | POA: Diagnosis not present

## 2019-04-03 NOTE — Progress Notes (Signed)
Patient Care Team: [name], MD as PCP - General (Family Medicine) Harl Bowie Alphonse Guild, MD as PCP - Cardiology (Cardiology)  DIAGNOSIS:    ICD-10-CM   1. Malignant neoplasm of upper-outer quadrant of left breast in female, estrogen receptor positive ([place])  C50.412    Z17.0     SUMMARY OF ONCOLOGIC HISTORY: Oncology History  Malignant neoplasm of upper-outer quadrant of left breast in female, estrogen receptor positive ([place])  07/09/2018 Initial Diagnosis   Screening mammogram detected spiculated mass in the left breast upper outer quadrant, 2.6 cm by MRI, axilla negative, biopsy revealed grade 2 IDC with DCIS, ER 100%, PR 70%, Ki-67 10%, HER-2 3+ positive, T2N0 stage Ib clinical stage   07/25/2018 Cancer Staging   Staging form: Breast, AJCC 8th Edition - Clinical stage from 07/25/2018: Stage IB (cT2, cN0, cM0, G2, ER+, PR+, HER2+) - Signed by Nicholas Lose, MD on 07/25/2018   08/02/2018 -  Neo-Adjuvant Chemotherapy   Neoadjuvant chemotherapy with Taxol weekly with Herceptin and Perjeta every 3 weeks   10/13/2018 Genetic Testing   Negative genetic testing on the multicancer panel.  The Multi-Gene Panel offered by [facility] includes sequencing and/or deletion duplication testing of the following 85 genes: AIP, ALK, APC, ATM, AXIN2,BAP1,  BARD1, BLM, BMPR1A, BRCA1, BRCA2, BRIP1, CASR, CDC73, CDH1, CDK4, CDKN1B, CDKN1C, CDKN2A (p14ARF), CDKN2A (p16INK4a), CEBPA, CHEK2, CTNNA1, DICER1, DIS3L2, EGFR (c.2369C>T, p.Thr790Met variant only), EPCAM (Deletion/duplication testing only), FH, FLCN, GATA2, GPC3, GREM1 (Promoter region deletion/duplication testing only), HOXB13 (c.251G>A, p.Gly84Glu), HRAS, KIT, MAX, MEN1, MET, MITF (c.952G>A, p.Glu318Lys variant only), MLH1, MSH2, MSH3, MSH6, MUTYH, NBN, NF1, NF2, NTHL1, PALB2, PDGFRA, PHOX2B, PMS2, POLD1, POLE, POT1, PRKAR1A, PTCH1, PTEN, RAD50, RAD51C, RAD51D, RB1, RECQL4, RET, RNF43, RUNX1, SDHAF2, SDHA (sequence changes only), SDHB, SDHC, SDHD, SMAD4,  SMARCA4, SMARCB1, SMARCE1, STK11, SUFU, TERC, TERT, TMEM127, TP53, TSC1, TSC2, VHL, WRN and WT1.  The report date is Oct 13, 2018.   12/10/2018 Surgery   Left lumpectomy ([facility]): IDC, 1.5cm, grade 2, HER-2 negative, ER+ 100%, PR+ 80%, Ki67 5%, clear margins, 2 lymph nodes negative for carcinoma.    12/10/2018 Cancer Staging   Staging form: Breast, AJCC 8th Edition - Pathologic stage from 12/10/2018: No Stage Recommended (ypT1c, pN0, cM0, G2, ER+, PR+, HER2-) - Signed by Gardenia Phlegm, NP on 12/18/2018   01/21/2019 - 02/18/2019 Radiation Therapy   Adjuvant radiation   01/31/2019 -  Chemotherapy   The patient had ado-trastuzumab emtansine (KADCYLA) 460 mg in sodium chloride 0.9 % 250 mL chemo infusion, 3.6 mg/kg = 460 mg, Intravenous, Once, 3 of 11 cycles Dose modification: 3 mg/kg (original dose 3.6 mg/kg, Cycle 2, Reason: Dose not tolerated) Administration: 460 mg (01/31/2019), 360 mg (02/21/2019), 360 mg (03/14/2019)  for chemotherapy treatment.      CHIEF COMPLIANT: Kadcyla maintenance  INTERVAL HISTORY: Destiny Nash is a 73 y.o. with above-mentioned history of left breast cancer who underwent neoadjuvant chemotherapy,a left lumpectomy, and completed radiation. She is currently on Kadcyla maintenance.She presents to the clinic todayfor treatment.   REVIEW OF SYSTEMS:   Constitutional: Denies fevers, chills or abnormal weight loss Eyes: Denies blurriness of vision Ears, nose, mouth, throat, and face: Denies mucositis or sore throat Respiratory: Denies cough, dyspnea or wheezes Cardiovascular: Denies palpitation, chest discomfort Gastrointestinal: Denies nausea, heartburn or change in bowel habits Skin: Denies abnormal skin rashes Lymphatics: Denies new lymphadenopathy or easy bruising Neurological: Denies numbness, tingling or new weaknesses Behavioral/Psych: Mood is stable, no new changes  Extremities: No lower extremity edema  Breast: denies any pain or lumps or nodules  in either breasts All other systems were reviewed with the patient and are negative.  I have reviewed the past medical history, past surgical history, social history and family history with the patient and they are unchanged from previous note.  ALLERGIES:  is allergic to levaquin [levofloxacin] and codeine.  MEDICATIONS:  Current Outpatient Medications  Medication Sig Dispense Refill   acetaminophen (TYLENOL) 500 MG tablet Take 1,000 mg by mouth every 6 (six) hours as needed for moderate pain or headache.     albuterol (PROVENTIL HFA;VENTOLIN HFA) 108 (90 Base) MCG/ACT inhaler Inhale 2 puffs into the lungs every 6 (six) hours as needed for wheezing or shortness of breath.     apixaban (ELIQUIS) 5 MG TABS tablet Take 1 tablet (5 mg total) by mouth 2 (two) times daily. 60 tablet 0   cholestyramine (QUESTRAN) 4 g packet Take 1 packet (4 g total) by mouth 3 (three) times daily with meals. (Patient taking differently: Take 4 g by mouth daily as needed. ) 60 each 12   CINNAMON PO Take 1 tablet by mouth daily.     diltiazem (CARDIZEM CD) 300 MG 24 hr capsule Take 1 capsule (300 mg total) by mouth daily. 90 capsule 3   diphenoxylate-atropine (LOMOTIL) 2.5-0.025 MG tablet Take 1 tablet by mouth 4 (four) times daily as needed for diarrhea or loose stools. 120 tablet 3   doxycycline (VIBRA-TABS) 100 MG tablet Take 1 tablet (100 mg total) by mouth 2 (two) times daily. 60 tablet 0   furosemide (LASIX) 40 MG tablet Take 1.5 tablets (60 mg total) by mouth daily. 135 tablet 3   insulin detemir (LEVEMIR) 100 UNIT/ML injection Inject 0.11 mLs (11 Units total) into the skin at bedtime. (Patient taking differently: Inject 8 Units into the skin at bedtime. )  0   LORazepam (ATIVAN) 1 MG tablet Take 1 tablet 30 min before radiotherapy, PRN anxiety and claustrophobia. 30 tablet 3   metFORMIN (GLUCOPHAGE) 500 MG tablet Take 1,000 mg by mouth 2 (two) times daily with a meal.      niacin 500 MG tablet Take  500 mg by mouth daily.     NYSTATIN powder Apply 1 Bottle topically daily as needed (irritation).      Omega-3 1000 MG CAPS Take 1,000 mg by mouth 2 (two) times daily.     ondansetron (ZOFRAN) 8 MG tablet Take 1 tablet (8 mg total) by mouth 2 (two) times daily as needed (Nausea or vomiting). 30 tablet 1   pantoprazole (PROTONIX) 40 MG tablet Take 1 tablet (40 mg total) by mouth daily. 30 tablet 3   potassium chloride SA (KLOR-CON) 20 MEQ tablet Take 1 tablet (20 mEq total) by mouth 2 (two) times daily. 60 tablet 2   pravastatin (PRAVACHOL) 20 MG tablet Take 20 mg by mouth daily before breakfast.   0   prochlorperazine (COMPAZINE) 10 MG tablet Take 1 tablet (10 mg total) by mouth every 6 (six) hours as needed (Nausea or vomiting). 30 tablet 1   sulfamethoxazole-trimethoprim (BACTRIM DS) 800-160 MG tablet Take 1 tablet by mouth 2 (two) times daily. (Patient not taking: Reported on 02/18/2019) 60 tablet 0   traMADol (ULTRAM) 50 MG tablet Take 1 tablet (50 mg total) by mouth every 6 (six) hours as needed. 20 tablet 0   No current facility-administered medications for this visit.     PHYSICAL EXAMINATION: ECOG PERFORMANCE STATUS: 2 - Symptomatic, <50% confined to bed  There were no vitals filed for this visit. There were no vitals filed for this visit.  GENERAL: alert, no distress and comfortable SKIN: skin color, texture, turgor are normal, no rashes or significant lesions EYES: normal, Conjunctiva are pink and non-injected, sclera clear OROPHARYNX: no exudate, no erythema and lips, buccal mucosa, and tongue normal  NECK: supple, thyroid normal size, non-tender, without nodularity LYMPH: no palpable lymphadenopathy in the cervical, axillary or inguinal LUNGS: clear to auscultation and percussion with normal breathing effort HEART: regular rate & rhythm and no murmurs and no lower extremity edema ABDOMEN: abdomen soft, non-tender and normal bowel sounds MUSCULOSKELETAL: no cyanosis of  digits and no clubbing  NEURO: alert & oriented x 3 with fluent speech, no focal motor/sensory deficits EXTREMITIES: No lower extremity edema  LABORATORY DATA:  I have reviewed the data as listed CMP Latest Ref Rng & Units 03/14/2019 02/21/2019 01/31/2019  Glucose 70 - 99 mg/dL 210(H) 130(H) 143(H)  BUN 8 - 23 mg/dL _0 Creatinine 0.44 - 1.00 mg/dL 0.90 0.89 0.83  Sodium 135 - 145 mmol/L 140 140 140  Potassium 3.5 - 5.1 mmol/L 4.1 2.9(LL) 3.9  Chloride 98 - 111 mmol/L 103 96(L) 98  CO2 22 - 32 mmol/L 26 32 32  Calcium 8.9 - 10.3 mg/dL 8.9 6.9(L) 8.0(L)  Total Protein 6.5 - 8.1 g/dL 6.7 6.4(L) 6.5  Total Bilirubin 0.3 - 1.2 mg/dL 0.4 0.4 0.3  Alkaline Phos 38 - 126 U/L 153(H) 143(H) 110  AST 15 - 41 U/L 14(L) 16 11(L)  ALT 0 - 44 U/L _1 Lab Results  Component Value Date   WBC 6.6 04/04/2019   HGB 9.5 (L) 04/04/2019   HCT 31.0 (L) 04/04/2019   MCV 77.1 (L) 04/04/2019   PLT 171 04/04/2019   NEUTROABS 4.6 04/04/2019    ASSESSMENT & PLAN:  Malignant neoplasm of upper-outer quadrant of left breast in female, estrogen receptor positive ([place]) 07/09/2018:Screening mammogram detected spiculated mass in the left breast upper outer quadrant, 2.6 cm by MRI, axilla negative, 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 Neoadjuvant chemotherapy withTaxol weekly x8Herceptin Perjeta (Perjeta discontinued due to diarrhea)every 3 weeksfollowed by Kadcylamaintenance for 1 year 2. 12/10/2018:Left lumpectomy ([facility]): IDC, 1.5cm, grade 2, HER-2 negative, ER+ 100%, PR+ 80%, Ki67 5%, clear margins, 2 lymph nodes negative for carcinoma. 3. Followed by adjuvant radiation therapystarted 01/21/2019 4.Followed by adjuvant antiestrogen therapy ------------------------------------------------------------------------------------------------------------------------------------- Current treatment:  Kadcyla maintenance started 01/31/2019 Kadcyla toxicities:No major side effects to Kadcyla. We reduced the dosage of Kadcyla because of anemia. Chemotherapy-induced anemia causing her fatigue, today's hemoglobin is 8.5 and is stable.  8/18: adjuvant radiation 8/28: Kadcyla  Treatment: Kadcyla Echocardiogram: 02/11/2019, EF 60 to 65% If she continues to decline in the performance status then we will discontinue Kadcyla.  I am going to discuss it with each visit and then make a determination.  She is fine to proceed with today's treatment.  Hypokalemia: Currently on potassium twice a day.   Return to clinic in 3 weeks for next dose.    No orders of the defined types were placed in this encounter.  The patient has a good understanding of the overall plan. she agrees with it. she will call with any problems that may develop before the next visit here.  Nicholas Lose, MD 04/04/2019  Julious Oka Dorshimer am acting as scribe for Dr.  Shaili Donalson. ° °I have reviewed the above documentation for accuracy and completeness, and I agree with the above. ° ° ° ° ° ° °

## 2019-04-04 ENCOUNTER — Other Ambulatory Visit: Payer: Self-pay

## 2019-04-04 ENCOUNTER — Inpatient Hospital Stay: Payer: Medicare Other

## 2019-04-04 ENCOUNTER — Inpatient Hospital Stay (HOSPITAL_BASED_OUTPATIENT_CLINIC_OR_DEPARTMENT_OTHER): Payer: Medicare Other | Admitting: Hematology and Oncology

## 2019-04-04 VITALS — BP 155/81 | HR 96 | Temp 98.3°F | Resp 17 | Ht 66.0 in | Wt 242.5 lb

## 2019-04-04 VITALS — BP 131/66 | HR 86 | Temp 97.7°F | Resp 16

## 2019-04-04 DIAGNOSIS — Z5112 Encounter for antineoplastic immunotherapy: Secondary | ICD-10-CM | POA: Diagnosis not present

## 2019-04-04 DIAGNOSIS — D630 Anemia in neoplastic disease: Secondary | ICD-10-CM

## 2019-04-04 DIAGNOSIS — E119 Type 2 diabetes mellitus without complications: Secondary | ICD-10-CM | POA: Diagnosis not present

## 2019-04-04 DIAGNOSIS — C50412 Malignant neoplasm of upper-outer quadrant of left female breast: Secondary | ICD-10-CM

## 2019-04-04 DIAGNOSIS — Z170 Estrogen receptor positive status [ER+]: Secondary | ICD-10-CM

## 2019-04-04 DIAGNOSIS — Z95828 Presence of other vascular implants and grafts: Secondary | ICD-10-CM

## 2019-04-04 DIAGNOSIS — E785 Hyperlipidemia, unspecified: Secondary | ICD-10-CM | POA: Diagnosis not present

## 2019-04-04 DIAGNOSIS — E876 Hypokalemia: Secondary | ICD-10-CM | POA: Diagnosis not present

## 2019-04-04 LAB — CBC WITH DIFFERENTIAL (CANCER CENTER ONLY)
Abs Immature Granulocytes: 0.02 10*3/uL (ref 0.00–0.07)
Basophils Absolute: 0.1 10*3/uL (ref 0.0–0.1)
Basophils Relative: 1 %
Eosinophils Absolute: 0.3 10*3/uL (ref 0.0–0.5)
Eosinophils Relative: 5 %
HCT: 31 % — ABNORMAL LOW (ref 36.0–46.0)
Hemoglobin: 9.5 g/dL — ABNORMAL LOW (ref 12.0–15.0)
Immature Granulocytes: 0 %
Lymphocytes Relative: 14 %
Lymphs Abs: 0.9 10*3/uL (ref 0.7–4.0)
MCH: 23.6 pg — ABNORMAL LOW (ref 26.0–34.0)
MCHC: 30.6 g/dL (ref 30.0–36.0)
MCV: 77.1 fL — ABNORMAL LOW (ref 80.0–100.0)
Monocytes Absolute: 0.7 10*3/uL (ref 0.1–1.0)
Monocytes Relative: 10 %
Neutro Abs: 4.6 10*3/uL (ref 1.7–7.7)
Neutrophils Relative %: 70 %
Platelet Count: 171 10*3/uL (ref 150–400)
RBC: 4.02 MIL/uL (ref 3.87–5.11)
RDW: 17.5 % — ABNORMAL HIGH (ref 11.5–15.5)
WBC Count: 6.6 10*3/uL (ref 4.0–10.5)
nRBC: 0 % (ref 0.0–0.2)

## 2019-04-04 LAB — CMP (CANCER CENTER ONLY)
ALT: 10 U/L (ref 0–44)
AST: 21 U/L (ref 15–41)
Albumin: 2.8 g/dL — ABNORMAL LOW (ref 3.5–5.0)
Alkaline Phosphatase: 160 U/L — ABNORMAL HIGH (ref 38–126)
Anion gap: 11 (ref 5–15)
BUN: 21 mg/dL (ref 8–23)
CO2: 25 mmol/L (ref 22–32)
Calcium: 9.1 mg/dL (ref 8.9–10.3)
Chloride: 102 mmol/L (ref 98–111)
Creatinine: 0.81 mg/dL (ref 0.44–1.00)
GFR, Est AFR Am: >60 mL/min (ref >60)
GFR, Estimated: >60 mL/min (ref >60)
Glucose, Bld: 173 mg/dL — ABNORMAL HIGH (ref 70–99)
Potassium: 4.1 mmol/L (ref 3.5–5.1)
Sodium: 138 mmol/L (ref 135–145)
Total Bilirubin: 0.5 mg/dL (ref 0.3–1.2)
Total Protein: 7.1 g/dL (ref 6.5–8.1)

## 2019-04-04 LAB — IRON AND TIBC
Iron: 20 ug/dL — ABNORMAL LOW (ref 28–170)
Saturation Ratios: 5 % — ABNORMAL LOW (ref 10.4–31.8)
TIBC: 368 ug/dL (ref 250–450)
UIBC: 348 ug/dL

## 2019-04-04 LAB — MAGNESIUM: Magnesium: 1 mg/dL — CL (ref 1.7–2.4)

## 2019-04-04 LAB — FERRITIN: Ferritin: 55 ng/mL (ref 11–307)

## 2019-04-04 MED ORDER — DIPHENHYDRAMINE HCL 25 MG PO CAPS
50.0000 mg | ORAL_CAPSULE | Freq: Once | ORAL | Status: AC
  Administered 2019-04-04: 09:00:00 50 mg via ORAL

## 2019-04-04 MED ORDER — HEPARIN SOD (PORK) LOCK FLUSH 100 UNIT/ML IV SOLN
500.0000 [IU] | Freq: Once | INTRAVENOUS | Status: DC | PRN
  Filled 2019-04-04: qty 5

## 2019-04-04 MED ORDER — DIPHENHYDRAMINE HCL 25 MG PO CAPS
ORAL_CAPSULE | ORAL | Status: AC
  Filled 2019-04-04: qty 2

## 2019-04-04 MED ORDER — SODIUM CHLORIDE 0.9 % IV SOLN
Freq: Once | INTRAVENOUS | Status: AC
  Administered 2019-04-04: 09:00:00 via INTRAVENOUS
  Filled 2019-04-04: qty 250

## 2019-04-04 MED ORDER — ACETAMINOPHEN 325 MG PO TABS
650.0000 mg | ORAL_TABLET | Freq: Once | ORAL | Status: AC
  Administered 2019-04-04: 650 mg via ORAL

## 2019-04-04 MED ORDER — SODIUM CHLORIDE 0.9% FLUSH
10.0000 mL | Freq: Once | INTRAVENOUS | Status: AC
  Administered 2019-04-04: 10 mL
  Filled 2019-04-04: qty 10

## 2019-04-04 MED ORDER — SODIUM CHLORIDE 0.9 % IV SOLN
360.0000 mg | Freq: Once | INTRAVENOUS | Status: AC
  Administered 2019-04-04: 10:00:00 360 mg via INTRAVENOUS
  Filled 2019-04-04: qty 10

## 2019-04-04 MED ORDER — MAGNESIUM SULFATE 2 GM/50ML IV SOLN
2.0000 g | Freq: Once | INTRAVENOUS | Status: DC
  Administered 2019-04-04: 12:00:00 2 g via INTRAVENOUS
  Filled 2019-04-04: qty 50

## 2019-04-04 MED ORDER — SODIUM CHLORIDE 0.9% FLUSH
10.0000 mL | INTRAVENOUS | Status: DC | PRN
  Filled 2019-04-04: qty 10

## 2019-04-04 MED ORDER — ACETAMINOPHEN 325 MG PO TABS
ORAL_TABLET | ORAL | Status: AC
  Filled 2019-04-04: qty 2

## 2019-04-04 MED ORDER — HEPARIN SOD (PORK) LOCK FLUSH 100 UNIT/ML IV SOLN
500.0000 [IU] | Freq: Once | INTRAVENOUS | Status: AC
  Administered 2019-04-04: 500 [IU]
  Filled 2019-04-04: qty 5

## 2019-04-04 NOTE — Patient Instructions (Signed)
[facility] Discharge Instructions for Patients Receiving Chemotherapy  Today you received the following chemotherapy agent: Ado-Trastuzumab Emtansine Illene Bolus).  To help prevent nausea and vomiting after your treatment, we encourage you to take your nausea medication.   If you develop nausea and vomiting that is not controlled by your nausea medication, call the clinic.   BELOW ARE SYMPTOMS THAT SHOULD BE REPORTED IMMEDIATELY:  *FEVER GREATER THAN 100.5 F  *CHILLS WITH OR WITHOUT FEVER  NAUSEA AND VOMITING THAT IS NOT CONTROLLED WITH YOUR NAUSEA MEDICATION  *UNUSUAL SHORTNESS OF BREATH  *UNUSUAL BRUISING OR BLEEDING  TENDERNESS IN MOUTH AND THROAT WITH OR WITHOUT PRESENCE OF ULCERS  *URINARY PROBLEMS  *BOWEL PROBLEMS  UNUSUAL RASH Items with * indicate a potential emergency and should be followed up as soon as possible.  Feel free to call the clinic should you have any questions or concerns. The clinic phone number is [phone].  Please show the [place] at check-in to the Emergency Department and triage nurse.  Ado-Trastuzumab Emtansine for injection What is this medicine? ADO-TRASTUZUMAB EMTANSINE (ADD oh traz TOO zuh mab em TAN zine) is a monoclonal antibody combined with chemotherapy. It is used to treat breast cancer. This medicine may be used for other purposes; ask your health care provider or pharmacist if you have questions. COMMON BRAND NAME(S): Kadcyla What should I tell my health care provider before I take this medicine? They need to know if you have any of these conditions:  heart disease  heart failure  infection (especially a virus infection such as chickenpox, cold sores, or herpes)  liver disease  lung or breathing disease, like asthma  tingling of the fingers or toes, or other nerve disorder  an unusual or allergic reaction to ado-trastuzumab emtansine, other medications, foods, dyes, or preservatives  pregnant or  trying to get pregnant  breast-feeding How should I use this medicine? This medicine is for infusion into a vein. It is given by a health care professional in a hospital or clinic setting. Talk to your pediatrician regarding the use of this medicine in children. Special care may be needed. Overdosage: If you think you have taken too much of this medicine contact a poison control center or emergency room at once. NOTE: This medicine is only for you. Do not share this medicine with others. What if I miss a dose? It is important not to miss your dose. Call your doctor or health care professional if you are unable to keep an appointment. What may interact with this medicine? This medicine may also interact with the following medications:  atazanavir  boceprevir  clarithromycin  delavirdine  indinavir  dalfopristin; quinupristin  isoniazid, INH  itraconazole  ketoconazole  nefazodone  nelfinavir  ritonavir  telaprevir  telithromycin  tipranavir  voriconazole This list may not describe all possible interactions. Give your health care provider a list of all the medicines, herbs, non-prescription drugs, or dietary supplements you use. Also tell them if you smoke, drink alcohol, or use illegal drugs. Some items may interact with your medicine. What should I watch for while using this medicine? Visit your doctor for checks on your progress. This drug may make you feel generally unwell. This is not uncommon, as chemotherapy can affect healthy cells as well as cancer cells. Report any side effects. Continue your course of treatment even though you feel ill unless your doctor tells you to stop. You may need blood work done while you are taking this medicine.  Call your doctor or health care professional for advice if you get a fever, chills or sore throat, or other symptoms of a cold or flu. Do not treat yourself. This drug decreases your body's ability to fight infections. Try to  avoid being around people who are sick. Be careful brushing and flossing your teeth or using a toothpick because you may get an infection or bleed more easily. If you have any dental work done, tell your dentist you are receiving this medicine. Avoid taking products that contain aspirin, acetaminophen, ibuprofen, naproxen, or ketoprofen unless instructed by your doctor. These medicines may hide a fever. Do not become pregnant while taking this medicine or for 7 months after stopping it, men with female partners should use contraception during treatment and for 4 months after the last dose. Women should inform their doctor if they wish to become pregnant or think they might be pregnant. There is a potential for serious side effects to an unborn child. Do not breast-feed an infant while taking this medicine or for 7 months after the last dose. Men who have a partner who is pregnant or who is capable of becoming pregnant should use a condom during sexual activity while taking this medicine and for 4 months after stopping it. Men should inform their doctors if they wish to father a child. This medicine may lower sperm counts. Talk to your health care professional or pharmacist for more information. What side effects may I notice from receiving this medicine? Side effects that you should report to your doctor or health care professional as soon as possible:  allergic reactions like skin rash, itching or hives, swelling of the face, lips, or tongue  breathing problems  chest pain or palpitations  fever or chills, sore throat  general ill feeling or flu-like symptoms  light-colored stools  nausea, vomiting  pain, tingling, numbness in the hands or feet  signs and symptoms of bleeding such as bloody or black, tarry stools; red or dark-brown urine; spitting up blood or brown material that looks like coffee grounds; red spots on the skin; unusual bruising or bleeding from the eye, gums, or nose   swelling of the legs or ankles  yellowing of the eyes or skin Side effects that usually do not require medical attention (report to your doctor or health care professional if they continue or are bothersome):  changes in taste  constipation  dizziness  headache  joint pain  muscle pain  trouble sleeping  unusually weak or tired This list may not describe all possible side effects. Call your doctor for medical advice about side effects. You may report side effects to FDA at 1-800-FDA-1088. Where should I keep my medicine? This drug is given in a hospital or clinic and will not be stored at home. NOTE: This sheet is a summary. It may not cover all possible information. If you have questions about this medicine, talk to your doctor, pharmacist, or health care provider.  2020 Elsevier/Gold Standard (2017-10-19 10:03:15)  Coronavirus (COVID-19) Are you at risk?  Are you at risk for the Coronavirus (COVID-19)?  To be considered HIGH RISK for Coronavirus (COVID-19), you have to meet the following criteria:  . Traveled to [place], [place], [place], [place] or [place]; or in the [place] to [place], [place], [place], or [place]; and have fever, cough, and shortness of breath within the last 2 weeks of travel OR . Been in close contact with a person diagnosed with COVID-19 within  the last 2 weeks and have fever, cough, and shortness of breath . IF YOU DO NOT MEET THESE CRITERIA, YOU ARE CONSIDERED LOW RISK FOR COVID-19.  What to do if you are HIGH RISK for COVID-19?  Marland Kitchen If you are having a medical emergency, call 911. . Seek medical care right away. Before you go to a doctor's office, urgent care or emergency department, call ahead and tell them about your recent travel, contact with someone diagnosed with COVID-19, and your symptoms. You should receive instructions from your physician's office regarding next steps of care.  . When you arrive at healthcare provider, tell the  healthcare staff immediately you have returned from visiting [place], [place], [place], [place] or [place]; or traveled in the [place] to [place], [place], [place], or [place]; in the last two weeks or you have been in close contact with a person diagnosed with COVID-19 in the last 2 weeks.   . Tell the health care staff about your symptoms: fever, cough and shortness of breath. . After you have been seen by a medical provider, you will be either: o Tested for (COVID-19) and discharged home on quarantine except to seek medical care if symptoms worsen, and asked to  - Stay home and avoid contact with others until you get your results (4-5 days)  - Avoid travel on public transportation if possible (such as bus, train, or airplane) or o Sent to the Emergency Department by EMS for evaluation, COVID-19 testing, and possible admission depending on your condition and test results.  What to do if you are LOW RISK for COVID-19?  Reduce your risk of any infection by using the same precautions used for avoiding the common cold or flu:  Marland Kitchen Wash your hands often with soap and warm water for at least 20 seconds.  If soap and water are not readily available, use an alcohol-based hand sanitizer with at least 60% alcohol.  . If coughing or sneezing, cover your mouth and nose by coughing or sneezing into the elbow areas of your shirt or coat, into a tissue or into your sleeve (not your hands). . Avoid shaking hands with others and consider head nods or verbal greetings only. . Avoid touching your eyes, nose, or mouth with unwashed hands.  . Avoid close contact with people who are sick. . Avoid places or events with large numbers of people in one location, like concerts or sporting events. . Carefully consider travel plans you have or are making. . If you are planning any travel outside or inside the [place], visit the CDC's Travelers' Health webpage for the latest health notices. . If you have some symptoms  but not all symptoms, continue to monitor at home and seek medical attention if your symptoms worsen. . If you are having a medical emergency, call 911.   [place] / e-Visit: [email or web address]         [facility]: [facility]: [phone]                   [facility]: [phone]. Hypokalemia Hypokalemia means that the amount of potassium in the blood is lower than normal. Potassium is a chemical (electrolyte) that helps regulate the amount of fluid in the body. It also stimulates muscle tightening (contraction) and helps nerves work properly. Normally, most of the body's potassium is inside cells, and only a very small amount is  in the blood. Because the amount in the blood is so small, minor changes to potassium levels in the blood can be life-threatening. What are the causes? This condition may be caused by:  Antibiotic medicine.  Diarrhea or vomiting. Taking too much of a medicine that helps you have a bowel movement (laxative) can cause diarrhea and lead to hypokalemia.  Chronic kidney disease (CKD).  Medicines that help the body get rid of excess fluid (diuretics).  Eating disorders, such as bulimia.  Low magnesium levels in the body.  Sweating a lot. What are the signs or symptoms? Symptoms of this condition include:  Weakness.  Constipation.  Fatigue.  Muscle cramps.  Mental confusion.  Skipped heartbeats or irregular heartbeat (palpitations).  Tingling or numbness. How is this diagnosed? This condition is diagnosed with a blood test. How is this treated? This condition may be treated by:  Taking potassium supplements by mouth.  Adjusting the medicines that you take.  Eating more foods that contain a lot of potassium. If your potassium level is very low, you may need to get potassium through an IV and  be monitored in the hospital. Follow these instructions at home:   Take over-the-counter and prescription medicines only as told by your health care provider. This includes vitamins and supplements.  Eat a healthy diet. A healthy diet includes fresh fruits and vegetables, whole grains, healthy fats, and lean proteins.  If instructed, eat more foods that contain a lot of potassium. This includes: ? Nuts, such as peanuts and pistachios. ? Seeds, such as sunflower seeds and pumpkin seeds. ? Peas, lentils, and lima beans. ? Whole grain and bran cereals and breads. ? Fresh fruits and vegetables, such as apricots, avocado, bananas, cantaloupe, kiwi, oranges, tomatoes, asparagus, and potatoes. ? Orange juice. ? Tomato juice. ? Red meats. ? Yogurt.  Keep all follow-up visits as told by your health care provider. This is important. Contact a health care provider if you:  Have weakness that gets worse.  Feel your heart pounding or racing.  Vomit.  Have diarrhea.  Have diabetes (diabetes mellitus) and you have trouble keeping your blood sugar (glucose) in your target range. Get help right away if you:  Have chest pain.  Have shortness of breath.  Have vomiting or diarrhea that lasts for more than 2 days.  Faint. Summary  Hypokalemia means that the amount of potassium in the blood is lower than normal.  This condition is diagnosed with a blood test.  Hypokalemia may be treated by taking potassium supplements, adjusting the medicines that you take, or eating more foods that are high in potassium.  If your potassium level is very low, you may need to get potassium through an IV and be monitored in the hospital. This information is not intended to replace advice given to you by your health care provider. Make sure you discuss any questions you have with your health care provider. Document Released: 05/22/2005 Document Revised: 01/02/2018 Document Reviewed: 01/02/2018 Elsevier  Patient Education  2020 [employer].

## 2019-04-04 NOTE — Assessment & Plan Note (Signed)
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 Neoadjuvant chemotherapy withTaxol weekly x8Herceptin Perjeta (Perjeta discontinued due to diarrhea)every 3 weeksfollowed by Kadcylamaintenance for 1 year 2. 12/10/2018:Left lumpectomy ([facility]): IDC, 1.5cm, grade 2, HER-2 negative, ER+ 100%, PR+ 80%, Ki67 5%, clear margins, 2 lymph nodes negative for carcinoma. 3. Followed by adjuvant radiation therapystarted 01/21/2019 4.Followed by adjuvant antiestrogen therapy ------------------------------------------------------------------------------------------------------------------------------------- Current treatment: Kadcyla maintenance started 01/31/2019 Kadcyla toxicities:No major side effects to Kadcyla. We reduced the dosage of Kadcyla because of anemia. Chemotherapy-induced anemia causing her fatigue, today's hemoglobin is 8.5 and is stable.  8/18: adjuvant radiation 8/28: Kadcyla  Treatment: Kadcyla Echocardiogram: 02/11/2019, EF 60 to 65% If she continues to decline in the performance status then we will discontinue Kadcyla.  I am going to discuss it with each visit and then make a determination.  She is fine to proceed with today's treatment.  Hypokalemia: Currently on potassium twice a day.   Return to clinic in 3 weeks for next dose.

## 2019-04-04 NOTE — Patient Instructions (Signed)
Hypomagnesemia Hypomagnesemia is a condition in which the level of magnesium in the blood is low. Magnesium is a mineral that is found in many foods. It is used in many different processes in the body. Hypomagnesemia can affect every organ in the body. In severe cases, it can cause life-threatening problems. What are the causes? This condition may be caused by:  Not getting enough magnesium in your diet.  Malnutrition.  Problems with absorbing magnesium from the intestines.  Dehydration.  Alcohol abuse.  Vomiting.  Severe or chronic diarrhea.  Some medicines, including medicines that make you urinate more (diuretics).  Certain diseases, such as kidney disease, diabetes, celiac disease, and overactive thyroid. What are the signs or symptoms? Symptoms of this condition include:  Loss of appetite.  Nausea and vomiting.  Involuntary shaking or trembling of a body part (tremor).  Muscle weakness.  Tingling in the arms and legs.  Sudden tightening of muscles (muscle spasms).  Confusion.  Psychiatric issues, such as depression, irritability, or psychosis.  A feeling of fluttering of the heart.  Seizures. These symptoms are more severe if magnesium levels drop suddenly. How is this diagnosed? This condition may be diagnosed based on:  Your symptoms and medical history.  A physical exam.  Blood and urine tests. How is this treated? Treatment depends on the cause and the severity of the condition. It may be treated with:  A magnesium supplement. This can be taken in pill form. If the condition is severe, magnesium is usually given through an IV.  Changes to your diet. You may be directed to eat foods that have a lot of magnesium, such as green leafy vegetables, peas, beans, and nuts.  Stopping any intake of alcohol. Follow these instructions at home:      Make sure that your diet includes foods with magnesium. Foods that have a lot of magnesium in them include:  ? Green leafy vegetables, such as spinach and broccoli. ? Beans and peas. ? Nuts and seeds, such as almonds and sunflower seeds. ? Whole grains, such as whole grain bread and fortified cereals.  Take magnesium supplements if your health care provider tells you to do that. Take them as directed.  Take over-the-counter and prescription medicines only as told by your health care provider.  Have your magnesium levels monitored as told by your health care provider.  When you are active, drink fluids that contain electrolytes.  Avoid drinking alcohol.  Keep all follow-up visits as told by your health care provider. This is important. Contact a health care provider if:  You get worse instead of better.  Your symptoms return. Get help right away if you:  Develop severe muscle weakness.  Have trouble breathing.  Feel that your heart is racing. Summary  Hypomagnesemia is a condition in which the level of magnesium in the blood is low.  Hypomagnesemia can affect every organ in the body.  Treatment may include eating more foods that contain magnesium, taking magnesium supplements, and not drinking alcohol.  Have your magnesium levels monitored as told by your health care provider. This information is not intended to replace advice given to you by your health care provider. Make sure you discuss any questions you have with your health care provider. Document Released: 02/15/2005 Document Revised: 05/04/2017 Document Reviewed: 04/23/2017 Elsevier Patient Education  2020 [employer].

## 2019-04-04 NOTE — Patient Instructions (Signed)

## 2019-04-08 DIAGNOSIS — I5033 Acute on chronic diastolic (congestive) heart failure: Secondary | ICD-10-CM | POA: Diagnosis not present

## 2019-04-08 DIAGNOSIS — I872 Venous insufficiency (chronic) (peripheral): Secondary | ICD-10-CM | POA: Diagnosis not present

## 2019-04-08 DIAGNOSIS — I130 Hypertensive heart and chronic kidney disease with heart failure and stage 1 through stage 4 chronic kidney disease, or unspecified chronic kidney disease: Secondary | ICD-10-CM | POA: Diagnosis not present

## 2019-04-08 DIAGNOSIS — N189 Chronic kidney disease, unspecified: Secondary | ICD-10-CM | POA: Diagnosis not present

## 2019-04-08 DIAGNOSIS — E1122 Type 2 diabetes mellitus with diabetic chronic kidney disease: Secondary | ICD-10-CM | POA: Diagnosis not present

## 2019-04-08 DIAGNOSIS — I890 Lymphedema, not elsewhere classified: Secondary | ICD-10-CM | POA: Diagnosis not present

## 2019-04-17 ENCOUNTER — Other Ambulatory Visit: Payer: Self-pay | Admitting: Hematology and Oncology

## 2019-04-17 ENCOUNTER — Telehealth: Payer: Self-pay | Admitting: *Deleted

## 2019-04-17 DIAGNOSIS — I130 Hypertensive heart and chronic kidney disease with heart failure and stage 1 through stage 4 chronic kidney disease, or unspecified chronic kidney disease: Secondary | ICD-10-CM | POA: Diagnosis not present

## 2019-04-17 DIAGNOSIS — I872 Venous insufficiency (chronic) (peripheral): Secondary | ICD-10-CM | POA: Diagnosis not present

## 2019-04-17 DIAGNOSIS — N189 Chronic kidney disease, unspecified: Secondary | ICD-10-CM | POA: Diagnosis not present

## 2019-04-17 DIAGNOSIS — I5033 Acute on chronic diastolic (congestive) heart failure: Secondary | ICD-10-CM | POA: Diagnosis not present

## 2019-04-17 DIAGNOSIS — E1122 Type 2 diabetes mellitus with diabetic chronic kidney disease: Secondary | ICD-10-CM | POA: Diagnosis not present

## 2019-04-17 DIAGNOSIS — I890 Lymphedema, not elsewhere classified: Secondary | ICD-10-CM | POA: Diagnosis not present

## 2019-04-17 NOTE — Telephone Encounter (Signed)
Called to notify pt that pt assistance [employer] has arrived in office.

## 2019-04-21 DIAGNOSIS — I872 Venous insufficiency (chronic) (peripheral): Secondary | ICD-10-CM | POA: Diagnosis not present

## 2019-04-21 DIAGNOSIS — I4891 Unspecified atrial fibrillation: Secondary | ICD-10-CM | POA: Diagnosis not present

## 2019-04-21 DIAGNOSIS — I130 Hypertensive heart and chronic kidney disease with heart failure and stage 1 through stage 4 chronic kidney disease, or unspecified chronic kidney disease: Secondary | ICD-10-CM | POA: Diagnosis not present

## 2019-04-21 DIAGNOSIS — I890 Lymphedema, not elsewhere classified: Secondary | ICD-10-CM | POA: Diagnosis not present

## 2019-04-21 DIAGNOSIS — I272 Pulmonary hypertension, unspecified: Secondary | ICD-10-CM | POA: Diagnosis not present

## 2019-04-21 DIAGNOSIS — F419 Anxiety disorder, unspecified: Secondary | ICD-10-CM | POA: Diagnosis not present

## 2019-04-21 DIAGNOSIS — C50912 Malignant neoplasm of unspecified site of left female breast: Secondary | ICD-10-CM | POA: Diagnosis not present

## 2019-04-21 DIAGNOSIS — Z483 Aftercare following surgery for neoplasm: Secondary | ICD-10-CM | POA: Diagnosis not present

## 2019-04-21 DIAGNOSIS — N189 Chronic kidney disease, unspecified: Secondary | ICD-10-CM | POA: Diagnosis not present

## 2019-04-21 DIAGNOSIS — E1122 Type 2 diabetes mellitus with diabetic chronic kidney disease: Secondary | ICD-10-CM | POA: Diagnosis not present

## 2019-04-21 DIAGNOSIS — I5033 Acute on chronic diastolic (congestive) heart failure: Secondary | ICD-10-CM | POA: Diagnosis not present

## 2019-04-22 ENCOUNTER — Encounter: Payer: Self-pay | Admitting: *Deleted

## 2019-04-22 NOTE — Progress Notes (Signed)
[name]  Clinical Social Work received phone call from patient wanting to follow up on her financial assistance applications.  CSW returned patients phone call.  CSW left a message offering support and encouraging patient to call back.  Destiny Nash, MSW, LCSW, OSW-C Clinical Social Worker [facility] [phone]

## 2019-04-23 DIAGNOSIS — I5033 Acute on chronic diastolic (congestive) heart failure: Secondary | ICD-10-CM | POA: Diagnosis not present

## 2019-04-23 DIAGNOSIS — I130 Hypertensive heart and chronic kidney disease with heart failure and stage 1 through stage 4 chronic kidney disease, or unspecified chronic kidney disease: Secondary | ICD-10-CM | POA: Diagnosis not present

## 2019-04-23 DIAGNOSIS — I890 Lymphedema, not elsewhere classified: Secondary | ICD-10-CM | POA: Diagnosis not present

## 2019-04-23 DIAGNOSIS — C50912 Malignant neoplasm of unspecified site of left female breast: Secondary | ICD-10-CM | POA: Diagnosis not present

## 2019-04-23 DIAGNOSIS — E1122 Type 2 diabetes mellitus with diabetic chronic kidney disease: Secondary | ICD-10-CM | POA: Diagnosis not present

## 2019-04-23 DIAGNOSIS — Z483 Aftercare following surgery for neoplasm: Secondary | ICD-10-CM | POA: Diagnosis not present

## 2019-04-24 ENCOUNTER — Telehealth (INDEPENDENT_AMBULATORY_CARE_PROVIDER_SITE_OTHER): Payer: Medicare Other | Admitting: Cardiology

## 2019-04-24 ENCOUNTER — Telehealth: Payer: Self-pay | Admitting: General Practice

## 2019-04-24 ENCOUNTER — Encounter: Payer: Self-pay | Admitting: General Practice

## 2019-04-24 ENCOUNTER — Encounter: Payer: Self-pay | Admitting: Cardiology

## 2019-04-24 VITALS — BP 149/79 | HR 92 | Ht 66.0 in | Wt 249.0 lb

## 2019-04-24 DIAGNOSIS — I890 Lymphedema, not elsewhere classified: Secondary | ICD-10-CM

## 2019-04-24 DIAGNOSIS — I5032 Chronic diastolic (congestive) heart failure: Secondary | ICD-10-CM

## 2019-04-24 MED ORDER — MAGNESIUM OXIDE 400 MG PO CAPS: 400.0000 mg | ORAL_CAPSULE | Freq: Two times a day (BID) | ORAL | 3 refills | Status: DC

## 2019-04-24 MED ORDER — MAGNESIUM OXIDE 400 MG PO CAPS: 400.0000 mg | ORAL_CAPSULE | Freq: Every day | ORAL | 3 refills | Status: AC

## 2019-04-24 NOTE — Progress Notes (Signed)
Patient Care Team: [name], MD as PCP - General (Family Medicine) Harl Bowie Alphonse Guild, MD as PCP - Cardiology (Cardiology)  DIAGNOSIS:    ICD-10-CM   1. Malignant neoplasm of upper-outer quadrant of left breast in female, estrogen receptor positive ([place])  C50.412    Z17.0     SUMMARY OF ONCOLOGIC HISTORY: Oncology History  Malignant neoplasm of upper-outer quadrant of left breast in female, estrogen receptor positive ([place])  07/09/2018 Initial Diagnosis   Screening mammogram detected spiculated mass in the left breast upper outer quadrant, 2.6 cm by MRI, axilla negative, biopsy revealed grade 2 IDC with DCIS, ER 100%, PR 70%, Ki-67 10%, HER-2 3+ positive, T2N0 stage Ib clinical stage   07/25/2018 Cancer Staging   Staging form: Breast, AJCC 8th Edition - Clinical stage from 07/25/2018: Stage IB (cT2, cN0, cM0, G2, ER+, PR+, HER2+) - Signed by Nicholas Lose, MD on 07/25/2018   08/02/2018 -  Neo-Adjuvant Chemotherapy   Neoadjuvant chemotherapy with Taxol weekly with Herceptin and Perjeta every 3 weeks   10/13/2018 Genetic Testing   Negative genetic testing on the multicancer panel.  The Multi-Gene Panel offered by [facility] includes sequencing and/or deletion duplication testing of the following 85 genes: AIP, ALK, APC, ATM, AXIN2,BAP1,  BARD1, BLM, BMPR1A, BRCA1, BRCA2, BRIP1, CASR, CDC73, CDH1, CDK4, CDKN1B, CDKN1C, CDKN2A (p14ARF), CDKN2A (p16INK4a), CEBPA, CHEK2, CTNNA1, DICER1, DIS3L2, EGFR (c.2369C>T, p.Thr790Met variant only), EPCAM (Deletion/duplication testing only), FH, FLCN, GATA2, GPC3, GREM1 (Promoter region deletion/duplication testing only), HOXB13 (c.251G>A, p.Gly84Glu), HRAS, KIT, MAX, MEN1, MET, MITF (c.952G>A, p.Glu318Lys variant only), MLH1, MSH2, MSH3, MSH6, MUTYH, NBN, NF1, NF2, NTHL1, PALB2, PDGFRA, PHOX2B, PMS2, POLD1, POLE, POT1, PRKAR1A, PTCH1, PTEN, RAD50, RAD51C, RAD51D, RB1, RECQL4, RET, RNF43, RUNX1, SDHAF2, SDHA (sequence changes only), SDHB, SDHC, SDHD, SMAD4,  SMARCA4, SMARCB1, SMARCE1, STK11, SUFU, TERC, TERT, TMEM127, TP53, TSC1, TSC2, VHL, WRN and WT1.  The report date is Oct 13, 2018.   12/10/2018 Surgery   Left lumpectomy ([facility]): IDC, 1.5cm, grade 2, HER-2 negative, ER+ 100%, PR+ 80%, Ki67 5%, clear margins, 2 lymph nodes negative for carcinoma.    12/10/2018 Cancer Staging   Staging form: Breast, AJCC 8th Edition - Pathologic stage from 12/10/2018: No Stage Recommended (ypT1c, pN0, cM0, G2, ER+, PR+, HER2-) - Signed by Gardenia Phlegm, NP on 12/18/2018   01/21/2019 - 02/18/2019 Radiation Therapy   Adjuvant radiation   01/31/2019 -  Chemotherapy   The patient had ado-trastuzumab emtansine (KADCYLA) 460 mg in sodium chloride 0.9 % 250 mL chemo infusion, 3.6 mg/kg = 460 mg, Intravenous, Once, 4 of 11 cycles Dose modification: 3 mg/kg (original dose 3.6 mg/kg, Cycle 2, Reason: Dose not tolerated) Administration: 460 mg (01/31/2019), 360 mg (02/21/2019), 360 mg (03/14/2019), 360 mg (04/04/2019)  for chemotherapy treatment.      CHIEF COMPLIANT: Kadcyla maintenance  INTERVAL HISTORY: Destiny Nash is a 73 y.o. with above-mentioned history of left breast cancer who underwent neoadjuvant chemotherapy,a left lumpectomy, and completed radiation. She is currently on Kadcyla maintenance.She presents to the clinic todayfor treatment.   She gets tired after each chemotherapy but denies neuropathy.  She has slightly decreased appetite.  Denies any nausea vomiting denies any diarrhea or constipation.  REVIEW OF SYSTEMS:   Constitutional: Fatigue Eyes: Denies blurriness of vision Ears, nose, mouth, throat, and face: Denies mucositis or sore throat Respiratory: Denies cough, dyspnea or wheezes Cardiovascular: Denies palpitation, chest discomfort Gastrointestinal: Denies nausea, heartburn or change in bowel habits Skin: Denies abnormal skin rashes Lymphatics: Denies new lymphadenopathy  or easy bruising Neurological: Denies numbness, tingling or  new weaknesses Behavioral/Psych: Mood is stable, no new changes  Extremities: No lower extremity edema Breast: denies any pain or lumps or nodules in either breasts All other systems were reviewed with the patient and are negative.  I have reviewed the past medical history, past surgical history, social history and family history with the patient and they are unchanged from previous note.  ALLERGIES:  is allergic to levaquin [levofloxacin] and codeine.  MEDICATIONS:  Current Outpatient Medications  Medication Sig Dispense Refill   acetaminophen (TYLENOL) 500 MG tablet Take 1,000 mg by mouth every 6 (six) hours as needed for moderate pain or headache.     albuterol (PROVENTIL HFA;VENTOLIN HFA) 108 (90 Base) MCG/ACT inhaler Inhale 2 puffs into the lungs every 6 (six) hours as needed for wheezing or shortness of breath.     apixaban (ELIQUIS) 5 MG TABS tablet Take 1 tablet (5 mg total) by mouth 2 (two) times daily. 60 tablet 0   cholestyramine (QUESTRAN) 4 g packet Take 1 packet (4 g total) by mouth 3 (three) times daily with meals. (Patient taking differently: Take 4 g by mouth daily as needed. ) 60 each 12   CINNAMON PO Take 1 tablet by mouth daily.     diltiazem (CARDIZEM CD) 300 MG 24 hr capsule Take 1 capsule (300 mg total) by mouth daily. 90 capsule 3   diphenoxylate-atropine (LOMOTIL) 2.5-0.025 MG tablet Take 1 tablet by mouth 4 (four) times daily as needed for diarrhea or loose stools. 120 tablet 3   furosemide (LASIX) 40 MG tablet Take 1.5 tablets (60 mg total) by mouth daily. 135 tablet 3   insulin detemir (LEVEMIR) 100 UNIT/ML injection Inject 0.11 mLs (11 Units total) into the skin at bedtime. (Patient taking differently: Inject 8 Units into the skin at bedtime. )  0   LORazepam (ATIVAN) 1 MG tablet Take 1 tablet 30 min before radiotherapy, PRN anxiety and claustrophobia. 30 tablet 3   Magnesium Oxide 400 MG CAPS Take 1 capsule (400 mg total) by mouth daily. 90 capsule 3     metFORMIN (GLUCOPHAGE) 500 MG tablet Take 1,000 mg by mouth 2 (two) times daily with a meal.      niacin 500 MG tablet Take 500 mg by mouth daily.     NYSTATIN powder Apply 1 Bottle topically daily as needed (irritation).      Omega-3 1000 MG CAPS Take 1,000 mg by mouth 2 (two) times daily.     ondansetron (ZOFRAN) 8 MG tablet Take 1 tablet (8 mg total) by mouth 2 (two) times daily as needed (Nausea or vomiting). 30 tablet 1   pantoprazole (PROTONIX) 40 MG tablet Take 1 tablet by mouth once daily 90 tablet 0   potassium chloride SA (KLOR-CON) 20 MEQ tablet Take 1 tablet (20 mEq total) by mouth 2 (two) times daily. 60 tablet 2   pravastatin (PRAVACHOL) 20 MG tablet Take 20 mg by mouth daily before breakfast.   0   prochlorperazine (COMPAZINE) 10 MG tablet Take 1 tablet (10 mg total) by mouth every 6 (six) hours as needed (Nausea or vomiting). 30 tablet 1   traMADol (ULTRAM) 50 MG tablet Take 1 tablet (50 mg total) by mouth every 6 (six) hours as needed. 20 tablet 0   No current facility-administered medications for this visit.     PHYSICAL EXAMINATION: ECOG PERFORMANCE STATUS: 1 - Symptomatic but completely ambulatory  Vitals:   04/25/19 0855  BP: Marland Kitchen)  148/78  Pulse: 95  Resp: 18  Temp: 98.3 F (36.8 C)  SpO2: 100%   Filed Weights   04/25/19 0855  Weight: 252 lb 14.4 oz (114.7 kg)    GENERAL: alert, no distress and comfortable SKIN: skin color, texture, turgor are normal, no rashes or significant lesions EYES: normal, Conjunctiva are pink and non-injected, sclera clear OROPHARYNX: no exudate, no erythema and lips, buccal mucosa, and tongue normal  NECK: supple, thyroid normal size, non-tender, without nodularity LYMPH: no palpable lymphadenopathy in the cervical, axillary or inguinal LUNGS: clear to auscultation and percussion with normal breathing effort HEART: regular rate & rhythm and no murmurs and no lower extremity edema ABDOMEN: abdomen soft, non-tender and  normal bowel sounds MUSCULOSKELETAL: no cyanosis of digits and no clubbing  NEURO: alert & oriented x 3 with fluent speech, no focal motor/sensory deficits EXTREMITIES: No lower extremity edema  LABORATORY DATA:  I have reviewed the data as listed CMP Latest Ref Rng & Units 04/04/2019 03/14/2019 02/21/2019  Glucose 70 - 99 mg/dL 173(H) 210(H) 130(H)  BUN 8 - 23 mg/dL 21 19 21   Creatinine 0.44 - 1.00 mg/dL 0.81 0.90 0.89  Sodium 135 - 145 mmol/L 138 140 140  Potassium 3.5 - 5.1 mmol/L 4.1 4.1 2.9(LL)  Chloride 98 - 111 mmol/L 102 103 96(L)  CO2 22 - 32 mmol/L 25 26 32  Calcium 8.9 - 10.3 mg/dL 9.1 8.9 6.9(L)  Total Protein 6.5 - 8.1 g/dL 7.1 6.7 6.4(L)  Total Bilirubin 0.3 - 1.2 mg/dL 0.5 0.4 0.4  Alkaline Phos 38 - 126 U/L 160(H) 153(H) 143(H)  AST 15 - 41 U/L 21 14(L) 16  ALT 0 - 44 U/L 10 8 6     Lab Results  Component Value Date   WBC 6.6 04/04/2019   HGB 9.5 (L) 04/04/2019   HCT 31.0 (L) 04/04/2019   MCV 77.1 (L) 04/04/2019   PLT 171 04/04/2019   NEUTROABS 4.6 04/04/2019    ASSESSMENT & PLAN:  Malignant neoplasm of upper-outer quadrant of left breast in female, estrogen receptor positive ([place]) 07/09/2018:Screening mammogram detected spiculated mass in the left breast upper outer quadrant, 2.6 cm by MRI, axilla negative, 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 Neoadjuvant chemotherapy withTaxol weekly x8Herceptin Perjeta (Perjeta discontinued due to diarrhea)every 3 weeksfollowed by Kadcylamaintenance for 1 year 2. 12/10/2018:Left lumpectomy ([facility]): IDC, 1.5cm, grade 2, HER-2 negative, ER+ 100%, PR+ 80%, Ki67 5%, clear margins, 2 lymph nodes negative for carcinoma. 3. Followed by adjuvant radiation therapystarted 01/21/2019 4.Followed by adjuvant antiestrogen  therapy ------------------------------------------------------------------------------------------------------------------------------------- Current treatment: Kadcyla maintenance started 01/31/2019 Kadcyla toxicities:No major side effects to Kadcyla. Wereducedthe dosage of Kadcyla because of anemia. Chemotherapy-induced anemia causing her fatigue, today's hemoglobin is 8.5 and is stable.  8/18: adjuvant radiation 8/28: Kadcyla  Treatment: Kadcyla Echocardiogram:02/11/2019,EF 60 to 65%    Hypokalemia:Currently onpotassium twice a day. Hypomagnesemia: On magnesium replacement therapy. Iron deficiency anemia: Will add Venofer with teacher for next 4 Kadcyla treatments.  Return to clinic in 3 weeks for next dose.    No orders of the defined types were placed in this encounter.  The patient has a good understanding of the overall plan. she agrees with it. she will call with any problems that may develop before the next visit here.  Nicholas Lose, MD 04/25/2019  Julious Oka Dorshimer, am acting as scribe for Dr. Nicholas Lose.  I have reviewed the  above documentation for accuracy and completeness, and I agree with the above.

## 2019-04-24 NOTE — Progress Notes (Signed)
Virtual Visit via Telephone Note   This visit type was conducted due to national recommendations for restrictions regarding the COVID-19 Pandemic (e.g. social distancing) in an effort to limit this patient's exposure and mitigate transmission in our community.  Due to her co-morbid illnesses, this patient is at least at moderate risk for complications without adequate follow up.  This format is felt to be most appropriate for this patient at this time.  The patient did not have access to video technology/had technical difficulties with video requiring transitioning to audio format only (telephone).  All issues noted in this document were discussed and addressed.  No physical exam could be performed with this format.  Please refer to the patient's chart for her  consent to telehealth for [facility].   Date:  04/24/2019   ID:  Destiny Nash, DOB Aug 20, 1945, MRN [id number]  Patient Location: Home Provider Location: Office  PCP:  [name], MD  Cardiologist:  Carlyle Dolly, MD  Electrophysiologist:  None   Evaluation Performed:  Follow-Up Visit  Chief Complaint:  Follow up  History of Present Illness:    Destiny Nash is a 73 y.o. female seen today for follow up of the following medical problems.This is a focused visit on her history of diastolic HF and leg edema.     1. LE edema/Lymphedema/Acute on chronic Diastolic HF/Pleural effusions - seen by vascular, thought to be lymphedema and venous insufficiency as a major contributor to her LE edema - imaging however has also showed signs of pulmonary edema and pleural effusion consistent with diastolic HF though fairly benign BNP last month, though can be misleading in diastolic HF and obesity  - during 8/18 visit with PA Strader lasix was increased to 60m daily. Was also to have home health Unna boots placed.  - repeat BMET showed Cr actually improved from last month.      02/2019 echo LVEF 60-65%,  indeterminate diastolic function. Normal RV function. Dilated IVC, PASP is 38 mmhg.Reported GLS 13.5% up from prior study    - during 02/12/19 visit increased lasix to 632mdaily.  -03/2019 labs Cr 0.8, BUN 21. Mg was 1.0 - much improved. Weight was 273 lbs --> 249 lbs. Some ongoing swelling but much improved. Breathing is doing ok. No recent lightheadedness. - was given IV Mg at cancer center.     The patient does not have symptoms concerning for COVID-19 infection (fever, chills, cough, or new shortness of breath).    Past Medical History:  Diagnosis Date  . Anemia    in the past   . Anxiety   . Atrial fibrillation ([facility]   a. diagnosed in 02/2018.  . Bronchitis    easily  develops bronchitis when she has a cold  . Cancer ([facility]   L breast - CA, Feb. 4th   . Cellulitis   . Chronic kidney disease   . Diabetes mellitus without complication ([place]   type 2  . Dyspnea   . Dysrhythmia    afib, treating with Eliquis  . Family history of breast cancer   . Family history of uterine cancer   . Heart murmur   . Hyperlipidemia   . Hypertension    Past Surgical History:  Procedure Laterality Date  . BREAST LUMPECTOMY WITH RADIOACTIVE SEED AND SENTINEL LYMPH NODE BIOPSY Left 12/10/2018   Procedure: LEFT BREAST LUMPECTOMY WITH RADIOACTIVE SEED AND LEFT SENTINEL LYMPH NODE MAPPING;  Surgeon: CoErroll LunaMD;  [facility]  Service: General;  Laterality: Left;  . DILATION AND CURETTAGE OF UTERUS     x2 on same day  . PORTACATH PLACEMENT N/A 08/01/2018   Procedure: INSERTION PORT-A-CATH WITH ULTRASOUND;  Surgeon: Erroll Luna, MD;  Location: [facility];  Service: General;  Laterality: N/A;  . TONSILLECTOMY AND ADENOIDECTOMY       No outpatient medications have been marked as taking for the 04/24/19 encounter (Appointment) with Arnoldo Lenis, MD.     Allergies:   Levaquin [levofloxacin] and Codeine   Social History   Tobacco Use  . Smoking status: Never Smoker  .  Smokeless tobacco: Never Used  Substance Use Topics  . Alcohol use: Not Currently  . Drug use: Never     Family Hx: The patient's family history includes Breast cancer in her cousin and cousin; CAD in her brother; Hyperlipidemia in her mother; Hypertension in her brother and mother; Uterine cancer in her maternal grandmother.  ROS:   Please see the history of present illness.     All other systems reviewed and are negative.   Prior CV studies:   The following studies were reviewed today:  Labs/Other Tests and Data Reviewed:    EKG:  No ECG reviewed.  Recent Labs: 01/15/2019: B Natriuretic Peptide 71.0 04/04/2019: ALT 10; BUN 21; Creatinine 0.81; Hemoglobin 9.5; Magnesium 1.0; Platelet Count 171; Potassium 4.1; Sodium 138   Recent Lipid Panel No results found for: CHOL, TRIG, HDL, CHOLHDL, LDLCALC, LDLDIRECT  Wt Readings from Last 3 Encounters:  04/04/19 242 lb 8 oz (110 kg)  02/21/19 261 lb 9.6 oz (118.7 kg)  01/15/19 277 lb (125.6 kg)     Objective:    Vital Signs:   Today's Vitals   04/24/19 1433  BP: (!) 149/79  Pulse: 92  Weight: 249 lb (112.9 kg)  Height: 5' 6"  (1.676 m)   Body mass index is 40.19 kg/m. Normal affect. Normal speech pattern and tone. Comfortable, no apparent distress. No audible signs of SOB or wheezing.   ASSESSMENT & PLAN:    1. Chronic diastolic HF - significant improvement on higher dose diuretic, labs have been stable. Edema is a combination of diastolic HF and lymphedema, suspect will not be able to completely resolve but has dramatically improved - continue current meds. Recent low Mg replaced by IV during cancer center appt, we will start magnesium oxide 461m daily at home.     2. Lymphedema - per vascular    COVID-19 Education: The signs and symptoms of COVID-19 were discussed with the patient and how to seek care for testing (follow up with PCP or arrange E-visit).  The importance of social distancing was discussed today.   Time:   Today, I have spent 15 minutes with the patient with telehealth technology discussing the above problems.     Medication Adjustments/Labs and Tests Ordered: Current medicines are reviewed at length with the patient today.  Concerns regarding medicines are outlined above.   Tests Ordered: No orders of the defined types were placed in this encounter.   Medication Changes: No orders of the defined types were placed in this encounter.   Follow Up:  In Person in 4 month(s)  Signed, BCarlyle Dolly MD  04/24/2019 1:34 PM    [facility]

## 2019-04-24 NOTE — Telephone Encounter (Signed)
[facility] CSW Progress Notes  Call from patient, wants to know if there is deadline for submitting applications for outside financial assistance, states her income situation is changing.  Called her and left VM stating that she can let us know when she is ready to proceed.  Edwyna Shell, LCSW Clinical Social Worker Phone:  [phone]

## 2019-04-24 NOTE — Patient Instructions (Signed)
Medication Instructions:  Start magnesium oxide 400 mg daily   Labwork: none  Testing/Procedures: none  Follow-Up: Your physician recommends that you schedule a follow-up appointment in: 4 months    Any Other Special Instructions Will Be Listed Below (If Applicable).     If you need a refill on your cardiac medications before your next appointment, please call your pharmacy.

## 2019-04-24 NOTE — Progress Notes (Signed)
[facility] CSW Progress Notes  Call to patients home/mobile numbers as well as daughter's contact number.  Patient has called asking for help w financial aid applications ([employer] [place] specifically) for help w unpaid medical bills.  CSW has mailed this application to patient and is awaiting completion and receipt of patient's portion of application.  Left Vms requesting calls back.  Edwyna Shell, LCSW Clinical Social Worker Phone:  [phone]

## 2019-04-25 ENCOUNTER — Inpatient Hospital Stay: Payer: Medicare Other

## 2019-04-25 ENCOUNTER — Other Ambulatory Visit: Payer: Self-pay

## 2019-04-25 ENCOUNTER — Inpatient Hospital Stay: Payer: Medicare Other | Attending: Hematology and Oncology

## 2019-04-25 ENCOUNTER — Inpatient Hospital Stay (HOSPITAL_BASED_OUTPATIENT_CLINIC_OR_DEPARTMENT_OTHER): Payer: Medicare Other | Admitting: Hematology and Oncology

## 2019-04-25 DIAGNOSIS — Z7901 Long term (current) use of anticoagulants: Secondary | ICD-10-CM | POA: Insufficient documentation

## 2019-04-25 DIAGNOSIS — Z170 Estrogen receptor positive status [ER+]: Secondary | ICD-10-CM

## 2019-04-25 DIAGNOSIS — Z5112 Encounter for antineoplastic immunotherapy: Secondary | ICD-10-CM | POA: Diagnosis not present

## 2019-04-25 DIAGNOSIS — Z794 Long term (current) use of insulin: Secondary | ICD-10-CM | POA: Insufficient documentation

## 2019-04-25 DIAGNOSIS — Z923 Personal history of irradiation: Secondary | ICD-10-CM | POA: Diagnosis not present

## 2019-04-25 DIAGNOSIS — E119 Type 2 diabetes mellitus without complications: Secondary | ICD-10-CM | POA: Diagnosis not present

## 2019-04-25 DIAGNOSIS — E785 Hyperlipidemia, unspecified: Secondary | ICD-10-CM | POA: Diagnosis not present

## 2019-04-25 DIAGNOSIS — R5383 Other fatigue: Secondary | ICD-10-CM | POA: Diagnosis not present

## 2019-04-25 DIAGNOSIS — D6481 Anemia due to antineoplastic chemotherapy: Secondary | ICD-10-CM | POA: Diagnosis not present

## 2019-04-25 DIAGNOSIS — C50412 Malignant neoplasm of upper-outer quadrant of left female breast: Secondary | ICD-10-CM

## 2019-04-25 DIAGNOSIS — Z95828 Presence of other vascular implants and grafts: Secondary | ICD-10-CM

## 2019-04-25 DIAGNOSIS — E876 Hypokalemia: Secondary | ICD-10-CM | POA: Insufficient documentation

## 2019-04-25 DIAGNOSIS — Z79899 Other long term (current) drug therapy: Secondary | ICD-10-CM | POA: Diagnosis not present

## 2019-04-25 LAB — CBC WITH DIFFERENTIAL (CANCER CENTER ONLY)
Abs Immature Granulocytes: 0.01 10*3/uL (ref 0.00–0.07)
Basophils Absolute: 0.1 10*3/uL (ref 0.0–0.1)
Basophils Relative: 1 %
Eosinophils Absolute: 0.2 10*3/uL (ref 0.0–0.5)
Eosinophils Relative: 4 %
HCT: 29 % — ABNORMAL LOW (ref 36.0–46.0)
Hemoglobin: 8.7 g/dL — ABNORMAL LOW (ref 12.0–15.0)
Immature Granulocytes: 0 %
Lymphocytes Relative: 17 %
Lymphs Abs: 1 10*3/uL (ref 0.7–4.0)
MCH: 22.7 pg — ABNORMAL LOW (ref 26.0–34.0)
MCHC: 30 g/dL (ref 30.0–36.0)
MCV: 75.5 fL — ABNORMAL LOW (ref 80.0–100.0)
Monocytes Absolute: 0.7 10*3/uL (ref 0.1–1.0)
Monocytes Relative: 12 %
Neutro Abs: 4.1 10*3/uL (ref 1.7–7.7)
Neutrophils Relative %: 66 %
Platelet Count: 165 10*3/uL (ref 150–400)
RBC: 3.84 MIL/uL — ABNORMAL LOW (ref 3.87–5.11)
RDW: 18.1 % — ABNORMAL HIGH (ref 11.5–15.5)
WBC Count: 6.1 10*3/uL (ref 4.0–10.5)
nRBC: 0 % (ref 0.0–0.2)

## 2019-04-25 LAB — CMP (CANCER CENTER ONLY)
ALT: 12 U/L (ref 0–44)
AST: 23 U/L (ref 15–41)
Albumin: 2.9 g/dL — ABNORMAL LOW (ref 3.5–5.0)
Alkaline Phosphatase: 140 U/L — ABNORMAL HIGH (ref 38–126)
Anion gap: 9 (ref 5–15)
BUN: 19 mg/dL (ref 8–23)
CO2: 24 mmol/L (ref 22–32)
Calcium: 8.7 mg/dL — ABNORMAL LOW (ref 8.9–10.3)
Chloride: 103 mmol/L (ref 98–111)
Creatinine: 0.86 mg/dL (ref 0.44–1.00)
GFR, Est AFR Am: >60 mL/min (ref >60)
GFR, Estimated: >60 mL/min (ref >60)
Glucose, Bld: 186 mg/dL — ABNORMAL HIGH (ref 70–99)
Potassium: 4.6 mmol/L (ref 3.5–5.1)
Sodium: 136 mmol/L (ref 135–145)
Total Bilirubin: 0.6 mg/dL (ref 0.3–1.2)
Total Protein: 6.9 g/dL (ref 6.5–8.1)

## 2019-04-25 MED ORDER — SODIUM CHLORIDE 0.9 % IV SOLN
2.9000 mg/kg | Freq: Once | INTRAVENOUS | Status: AC
  Administered 2019-04-25: 360 mg via INTRAVENOUS
  Filled 2019-04-25: qty 10

## 2019-04-25 MED ORDER — DIPHENHYDRAMINE HCL 25 MG PO CAPS
50.0000 mg | ORAL_CAPSULE | Freq: Once | ORAL | Status: AC
  Administered 2019-04-25: 50 mg via ORAL

## 2019-04-25 MED ORDER — ACETAMINOPHEN 325 MG PO TABS
650.0000 mg | ORAL_TABLET | Freq: Once | ORAL | Status: AC
  Administered 2019-04-25: 650 mg via ORAL

## 2019-04-25 MED ORDER — SODIUM CHLORIDE 0.9 % IV SOLN
Freq: Once | INTRAVENOUS | Status: AC
  Administered 2019-04-25: 10:00:00 via INTRAVENOUS
  Filled 2019-04-25: qty 250

## 2019-04-25 MED ORDER — SODIUM CHLORIDE 0.9% FLUSH
10.0000 mL | Freq: Once | INTRAVENOUS | Status: AC
  Administered 2019-04-25: 09:00:00 10 mL
  Filled 2019-04-25: qty 10

## 2019-04-25 MED ORDER — SODIUM CHLORIDE 0.9 % IV SOLN
200.0000 mg | Freq: Once | INTRAVENOUS | Status: AC
  Administered 2019-04-25: 200 mg via INTRAVENOUS
  Filled 2019-04-25: qty 10

## 2019-04-25 MED ORDER — ACETAMINOPHEN 325 MG PO TABS
ORAL_TABLET | ORAL | Status: AC
  Filled 2019-04-25: qty 2

## 2019-04-25 MED ORDER — SODIUM CHLORIDE 0.9% FLUSH
10.0000 mL | INTRAVENOUS | Status: DC | PRN
  Administered 2019-04-25: 10 mL
  Filled 2019-04-25: qty 10

## 2019-04-25 MED ORDER — SODIUM CHLORIDE 0.9 % IV SOLN
Freq: Once | INTRAVENOUS | Status: DC
  Filled 2019-04-25: qty 250

## 2019-04-25 MED ORDER — HEPARIN SOD (PORK) LOCK FLUSH 100 UNIT/ML IV SOLN
500.0000 [IU] | Freq: Once | INTRAVENOUS | Status: AC | PRN
  Administered 2019-04-25: 500 [IU]
  Filled 2019-04-25: qty 5

## 2019-04-25 MED ORDER — DIPHENHYDRAMINE HCL 25 MG PO CAPS
ORAL_CAPSULE | ORAL | Status: AC
  Filled 2019-04-25: qty 2

## 2019-04-25 NOTE — Patient Instructions (Signed)
[facility] Discharge Instructions for Patients Receiving Chemotherapy  Today you received the following chemotherapy agent: Ado-Trastuzumab Emtansine Illene Bolus). And Venofer.  To help prevent nausea and vomiting after your treatment, we encourage you to take your nausea medication.   If you develop nausea and vomiting that is not controlled by your nausea medication, call the clinic.   BELOW ARE SYMPTOMS THAT SHOULD BE REPORTED IMMEDIATELY:  *FEVER GREATER THAN 100.5 F  *CHILLS WITH OR WITHOUT FEVER  NAUSEA AND VOMITING THAT IS NOT CONTROLLED WITH YOUR NAUSEA MEDICATION  *UNUSUAL SHORTNESS OF BREATH  *UNUSUAL BRUISING OR BLEEDING  TENDERNESS IN MOUTH AND THROAT WITH OR WITHOUT PRESENCE OF ULCERS  *URINARY PROBLEMS  *BOWEL PROBLEMS  UNUSUAL RASH Items with * indicate a potential emergency and should be followed up as soon as possible.  Feel free to call the clinic should you have any questions or concerns. The clinic phone number is [phone].  Please show the [facility] at check-in to the Emergency Department and triage nurse.  Iron Sucrose injection What is this medicine? IRON SUCROSE (AHY ern SOO krohs) is an iron complex. Iron is used to make healthy red blood cells, which carry oxygen and nutrients throughout the body. This medicine is used to treat iron deficiency anemia in people with chronic kidney disease. This medicine may be used for other purposes; ask your health care provider or pharmacist if you have questions. COMMON BRAND NAME(S): Venofer What should I tell my health care provider before I take this medicine? They need to know if you have any of these conditions:  anemia not caused by low iron levels  heart disease  high levels of iron in the blood  kidney disease  liver disease  an unusual or allergic reaction to iron, other medicines, foods, dyes, or preservatives  pregnant or trying to get  pregnant  breast-feeding How should I use this medicine? This medicine is for infusion into a vein. It is given by a health care professional in a hospital or clinic setting. Talk to your pediatrician regarding the use of this medicine in children. While this drug may be prescribed for children as young as 2 years for selected conditions, precautions do apply. Overdosage: If you think you have taken too much of this medicine contact a poison control center or emergency room at once. NOTE: This medicine is only for you. Do not share this medicine with others. What if I miss a dose? It is important not to miss your dose. Call your doctor or health care professional if you are unable to keep an appointment. What may interact with this medicine? Do not take this medicine with any of the following medications:  deferoxamine  dimercaprol  other iron products This medicine may also interact with the following medications:  chloramphenicol  deferasirox This list may not describe all possible interactions. Give your health care provider a list of all the medicines, herbs, non-prescription drugs, or dietary supplements you use. Also tell them if you smoke, drink alcohol, or use illegal drugs. Some items may interact with your medicine. What should I watch for while using this medicine? Visit your doctor or healthcare professional regularly. Tell your doctor or healthcare professional if your symptoms do not start to get better or if they get worse. You may need blood work done while you are taking this medicine. You may need to follow a special diet. Talk to your doctor. Foods that contain iron include: whole grains/cereals, dried  fruits, beans, or peas, leafy green vegetables, and organ meats (liver, kidney). What side effects may I notice from receiving this medicine? Side effects that you should report to your doctor or health care professional as soon as possible:  allergic reactions like  skin rash, itching or hives, swelling of the face, lips, or tongue  breathing problems  changes in blood pressure  cough  fast, irregular heartbeat  feeling faint or lightheaded, falls  fever or chills  flushing, sweating, or hot feelings  joint or muscle aches/pains  seizures  swelling of the ankles or feet  unusually weak or tired Side effects that usually do not require medical attention (report to your doctor or health care professional if they continue or are bothersome):  diarrhea  feeling achy  headache  irritation at site where injected  nausea, vomiting  stomach upset  tiredness This list may not describe all possible side effects. Call your doctor for medical advice about side effects. You may report side effects to FDA at 1-800-FDA-1088. Where should I keep my medicine? This drug is given in a hospital or clinic and will not be stored at home. NOTE: This sheet is a summary. It may not cover all possible information. If you have questions about this medicine, talk to your doctor, pharmacist, or health care provider.  2020 Elsevier/Gold Standard (2011-03-02 17:14:35)

## 2019-04-25 NOTE — Assessment & Plan Note (Signed)
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 Neoadjuvant chemotherapy withTaxol weekly x8Herceptin Perjeta (Perjeta discontinued due to diarrhea)every 3 weeksfollowed by Kadcylamaintenance for 1 year 2. 12/10/2018:Left lumpectomy ([facility]): IDC, 1.5cm, grade 2, HER-2 negative, ER+ 100%, PR+ 80%, Ki67 5%, clear margins, 2 lymph nodes negative for carcinoma. 3. Followed by adjuvant radiation therapystarted 01/21/2019 4.Followed by adjuvant antiestrogen therapy ------------------------------------------------------------------------------------------------------------------------------------- Current treatment: Kadcyla maintenance started 01/31/2019 Kadcyla toxicities:No major side effects to Kadcyla. Wereducedthe dosage of Kadcyla because of anemia. Chemotherapy-induced anemia causing her fatigue, today's hemoglobin is 8.5 and is stable.  8/18: adjuvant radiation 8/28: Kadcyla  Treatment: Kadcyla Echocardiogram:02/11/2019,EF 60 to 65% If she continues to decline in the performance status then we will discontinue Kadcyla.I am going to discuss it with each visit and then make a determination. She is fine to proceed with today's treatment.  Hypokalemia:Currently onpotassium twice a day.   Return to clinic in 3 weeks for next dose.

## 2019-04-25 NOTE — Progress Notes (Signed)
Patient declined to stay for 30 min post-Kadcyla observation period. Assisted out of treatment area via wheelchair without incident

## 2019-04-30 DIAGNOSIS — E1122 Type 2 diabetes mellitus with diabetic chronic kidney disease: Secondary | ICD-10-CM | POA: Diagnosis not present

## 2019-04-30 DIAGNOSIS — C50912 Malignant neoplasm of unspecified site of left female breast: Secondary | ICD-10-CM | POA: Diagnosis not present

## 2019-04-30 DIAGNOSIS — I130 Hypertensive heart and chronic kidney disease with heart failure and stage 1 through stage 4 chronic kidney disease, or unspecified chronic kidney disease: Secondary | ICD-10-CM | POA: Diagnosis not present

## 2019-04-30 DIAGNOSIS — I890 Lymphedema, not elsewhere classified: Secondary | ICD-10-CM | POA: Diagnosis not present

## 2019-04-30 DIAGNOSIS — I5033 Acute on chronic diastolic (congestive) heart failure: Secondary | ICD-10-CM | POA: Diagnosis not present

## 2019-04-30 DIAGNOSIS — Z483 Aftercare following surgery for neoplasm: Secondary | ICD-10-CM | POA: Diagnosis not present

## 2019-05-06 DIAGNOSIS — E1122 Type 2 diabetes mellitus with diabetic chronic kidney disease: Secondary | ICD-10-CM | POA: Diagnosis not present

## 2019-05-06 DIAGNOSIS — C50912 Malignant neoplasm of unspecified site of left female breast: Secondary | ICD-10-CM | POA: Diagnosis not present

## 2019-05-06 DIAGNOSIS — I5033 Acute on chronic diastolic (congestive) heart failure: Secondary | ICD-10-CM | POA: Diagnosis not present

## 2019-05-06 DIAGNOSIS — Z483 Aftercare following surgery for neoplasm: Secondary | ICD-10-CM | POA: Diagnosis not present

## 2019-05-06 DIAGNOSIS — I890 Lymphedema, not elsewhere classified: Secondary | ICD-10-CM | POA: Diagnosis not present

## 2019-05-06 DIAGNOSIS — I130 Hypertensive heart and chronic kidney disease with heart failure and stage 1 through stage 4 chronic kidney disease, or unspecified chronic kidney disease: Secondary | ICD-10-CM | POA: Diagnosis not present

## 2019-05-09 ENCOUNTER — Telehealth: Payer: Self-pay | Admitting: Cardiology

## 2019-05-09 NOTE — Telephone Encounter (Signed)
Patient called asking for help with her patient assistance form

## 2019-05-09 NOTE — Telephone Encounter (Signed)
Pt calling to ask when her patient assistance was started. And where to find her OOP for 2021. Pt notified and voiced understanding.

## 2019-05-14 DIAGNOSIS — I5033 Acute on chronic diastolic (congestive) heart failure: Secondary | ICD-10-CM | POA: Diagnosis not present

## 2019-05-14 DIAGNOSIS — C50912 Malignant neoplasm of unspecified site of left female breast: Secondary | ICD-10-CM | POA: Diagnosis not present

## 2019-05-14 DIAGNOSIS — I130 Hypertensive heart and chronic kidney disease with heart failure and stage 1 through stage 4 chronic kidney disease, or unspecified chronic kidney disease: Secondary | ICD-10-CM | POA: Diagnosis not present

## 2019-05-14 DIAGNOSIS — E1122 Type 2 diabetes mellitus with diabetic chronic kidney disease: Secondary | ICD-10-CM | POA: Diagnosis not present

## 2019-05-14 DIAGNOSIS — I890 Lymphedema, not elsewhere classified: Secondary | ICD-10-CM | POA: Diagnosis not present

## 2019-05-14 DIAGNOSIS — Z483 Aftercare following surgery for neoplasm: Secondary | ICD-10-CM | POA: Diagnosis not present

## 2019-05-15 NOTE — Progress Notes (Signed)
Patient Care Team: [name], MD as PCP - General (Family Medicine) Harl Bowie, Alphonse Guild, MD as PCP - Cardiology (Cardiology) Mauro Kaufmann, RN as Oncology Nurse Navigator Rockwell Germany, RN as Oncology Nurse Navigator  DIAGNOSIS:    ICD-10-CM   1. 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  The Multi-Gene Panel offered by [facility] includes sequencing and/or deletion duplication testing of the following 85 genes: AIP, ALK, APC, ATM, AXIN2,BAP1,  BARD1, BLM, BMPR1A, BRCA1, BRCA2, BRIP1, CASR, CDC73, CDH1, CDK4, CDKN1B, CDKN1C, CDKN2A (p14ARF), CDKN2A (p16INK4a), CEBPA, CHEK2, CTNNA1, DICER1, DIS3L2, EGFR (c.2369C>T, p.Thr790Met variant only), EPCAM (Deletion/duplication testing only), FH, FLCN, GATA2, GPC3, GREM1 (Promoter region deletion/duplication testing only), HOXB13 (c.251G>A, p.Gly84Glu), HRAS, KIT, MAX, MEN1, MET, MITF (c.952G>A, p.Glu318Lys variant only), MLH1, MSH2, MSH3, MSH6, MUTYH, NBN, NF1, NF2, NTHL1, PALB2, PDGFRA, PHOX2B, PMS2, POLD1, POLE, POT1, PRKAR1A, PTCH1, PTEN, RAD50, RAD51C,  RAD51D, RB1, RECQL4, RET, RNF43, RUNX1, SDHAF2, SDHA (sequence changes only), SDHB, SDHC, SDHD, SMAD4, SMARCA4, SMARCB1, SMARCE1, STK11, SUFU, TERC, TERT, TMEM127, TP53, TSC1, TSC2, VHL, WRN and WT1.  The report date is Oct 13, 2018.   12/10/2018 Surgery   Left lumpectomy ([facility]): IDC, 1.5cm, grade 2, HER-2 negative, ER+ 100%, PR+ 80%, Ki67 5%, clear margins, 2 lymph nodes negative for carcinoma.    12/10/2018 Cancer Staging   Staging form: Breast, AJCC 8th Edition - Pathologic stage from 12/10/2018: No Stage Recommended (ypT1c, pN0, cM0, G2, ER+, PR+, HER2-) - Signed by Gardenia Phlegm, NP on 12/18/2018   01/21/2019 - 02/18/2019 Radiation Therapy   Adjuvant radiation   01/31/2019 -  Chemotherapy   The patient had ado-trastuzumab emtansine (KADCYLA) 460 mg in sodium chloride 0.9 % 250 mL chemo infusion, 3.6 mg/kg = 460 mg, Intravenous, Once, 5 of 11 cycles Dose modification: 3 mg/kg (original dose 3.6 mg/kg, Cycle 2, Reason: Dose not tolerated) Administration: 460 mg (01/31/2019), 360 mg (02/21/2019), 360 mg (04/25/2019), 360 mg (03/14/2019), 360 mg (04/04/2019)  for chemotherapy treatment.      CHIEF COMPLIANT: Kadcyla maintenance  INTERVAL HISTORY: Destiny Nash is a 73 y.o. with above-mentioned history of left breast cancer who underwent neoadjuvant chemotherapy,a left lumpectomy, and completed radiation. She is currently on Kadcyla maintenance.She presents to the clinic todayfor treatment. She continues to feel moderately tired.  Denies any nausea or vomiting.  She has occasional loose stools.  She tells me that her energy levels are improving.  REVIEW OF SYSTEMS:   Constitutional: Denies fevers, chills or abnormal weight loss, fatigue and wheelchair ambulation Eyes: Denies blurriness of vision Ears, nose, mouth, throat, and face: Denies mucositis or sore  throat Respiratory: Denies cough, dyspnea or wheezes Cardiovascular: Denies palpitation, chest discomfort Gastrointestinal:  Denies nausea, heartburn or change in bowel habits Skin: Denies abnormal skin rashes Lymphatics: Denies new lymphadenopathy or easy bruising Neurological: Denies numbness, tingling or new weaknesses Behavioral/Psych: Mood is stable, no new changes  Extremities: No lower extremity edema Breast: denies any pain or lumps or nodules in either breasts All other systems were reviewed with the patient and are negative.  I have reviewed the past medical history, past surgical history, social history and family history with the patient and they are unchanged from previous note.  ALLERGIES:  is allergic to levaquin [levofloxacin] and codeine.  MEDICATIONS:  Current Outpatient Medications  Medication Sig Dispense Refill  . acetaminophen (TYLENOL) 500 MG tablet Take 1,000 mg by mouth every 6 (six) hours as needed for moderate pain or headache.    . albuterol (PROVENTIL HFA;VENTOLIN HFA) 108 (90 Base) MCG/ACT inhaler Inhale 2 puffs into the lungs every 6 (six) hours as needed for wheezing or shortness of breath.    Marland Kitchen apixaban (ELIQUIS) 5 MG TABS tablet Take 1 tablet (5 mg total) by mouth 2 (two) times daily. 60 tablet 0  . cholestyramine (QUESTRAN) 4 g packet Take 1 packet (4 g total) by mouth 3 (three) times daily with meals. (Patient taking differently: Take 4 g by mouth daily as needed. ) 60 each 12  . CINNAMON PO Take 1 tablet by mouth daily.    Marland Kitchen diltiazem (CARDIZEM CD) 300 MG 24 hr capsule Take 1 capsule (300 mg total) by mouth daily. 90 capsule 3  . diphenoxylate-atropine (LOMOTIL) 2.5-0.025 MG tablet Take 1 tablet by mouth 4 (four) times daily as needed for diarrhea or loose stools. 120 tablet 3  . furosemide (LASIX) 40 MG tablet Take 1.5 tablets (60 mg total) by mouth daily. 135 tablet 3  . insulin detemir (LEVEMIR) 100 UNIT/ML injection Inject 0.11 mLs (11 Units total) into the skin at bedtime. (Patient taking differently: Inject 8 Units into the skin at bedtime. )  0  . LORazepam (ATIVAN) 1 MG  tablet Take 1 tablet 30 min before radiotherapy, PRN anxiety and claustrophobia. 30 tablet 3  . Magnesium Oxide 400 MG CAPS Take 1 capsule (400 mg total) by mouth daily. 90 capsule 3  . metFORMIN (GLUCOPHAGE) 500 MG tablet Take 1,000 mg by mouth 2 (two) times daily with a meal.     . niacin 500 MG tablet Take 500 mg by mouth daily.    . NYSTATIN powder Apply 1 Bottle topically daily as needed (irritation).     . Omega-3 1000 MG CAPS Take 1,000 mg by mouth 2 (two) times daily.    . ondansetron (ZOFRAN) 8 MG tablet Take 1 tablet (8 mg total) by mouth 2 (two) times daily as needed (Nausea or vomiting). 30 tablet 1  . pantoprazole (PROTONIX) 40 MG tablet Take 1 tablet by mouth once daily 90 tablet 0  . potassium chloride SA (KLOR-CON) 20 MEQ tablet Take 1 tablet (20 mEq total) by mouth 2 (two) times daily. 60 tablet 2  . pravastatin (PRAVACHOL) 20 MG tablet Take 20 mg by mouth daily before breakfast.   0  . prochlorperazine (COMPAZINE) 10 MG tablet Take 1 tablet (10 mg total) by mouth every 6 (six) hours as needed (Nausea or vomiting). 30 tablet 1  . traMADol (ULTRAM) 50 MG tablet Take 1 tablet (50 mg total) by mouth every 6 (six) hours as needed. 20 tablet 0   No current  facility-administered medications for this visit.    PHYSICAL EXAMINATION: ECOG PERFORMANCE STATUS: 1 - Symptomatic but completely ambulatory  Vitals:   05/16/19 0918  BP: (!) 145/68  Pulse: 94  Resp: 17  Temp: 97.7 F (36.5 C)  SpO2: 100%   Filed Weights    GENERAL: alert, no distress and comfortable SKIN: skin color, texture, turgor are normal, no rashes or significant lesions EYES: normal, Conjunctiva are pink and non-injected, sclera clear OROPHARYNX: no exudate, no erythema and lips, buccal mucosa, and tongue normal  NECK: supple, thyroid normal size, non-tender, without nodularity LYMPH: no palpable lymphadenopathy in the cervical, axillary or inguinal LUNGS: clear to auscultation and percussion with normal  breathing effort HEART: regular rate & rhythm and no murmurs and no lower extremity edema ABDOMEN: abdomen soft, non-tender and normal bowel sounds MUSCULOSKELETAL: no cyanosis of digits and no clubbing  NEURO: alert & oriented x 3 with fluent speech, no focal motor/sensory deficits EXTREMITIES: 1+ lower extremity edema  LABORATORY DATA:  I have reviewed the data as listed CMP Latest Ref Rng & Units 04/25/2019 04/04/2019 03/14/2019  Glucose 70 - 99 mg/dL 186(H) 173(H) 210(H)  BUN 8 - 23 mg/dL 19 21 19   Creatinine 0.44 - 1.00 mg/dL 0.86 0.81 0.90  Sodium 135 - 145 mmol/L 136 138 140  Potassium 3.5 - 5.1 mmol/L 4.6 4.1 4.1  Chloride 98 - 111 mmol/L 103 102 103  CO2 22 - 32 mmol/L 24 25 26   Calcium 8.9 - 10.3 mg/dL 8.7(L) 9.1 8.9  Total Protein 6.5 - 8.1 g/dL 6.9 7.1 6.7  Total Bilirubin 0.3 - 1.2 mg/dL 0.6 0.5 0.4  Alkaline Phos 38 - 126 U/L 140(H) 160(H) 153(H)  AST 15 - 41 U/L 23 21 14(L)  ALT 0 - 44 U/L 12 10 8     Lab Results  Component Value Date   WBC 7.9 05/16/2019   HGB 8.4 (L) 05/16/2019   HCT 28.2 (L) 05/16/2019   MCV 74.6 (L) 05/16/2019   PLT 156 05/16/2019   NEUTROABS 6.4 05/16/2019    ASSESSMENT & PLAN:  Malignant neoplasm of upper-outer quadrant of left breast in female, estrogen receptor positive ([place]) 07/09/2018:Screening mammogram detected spiculated mass in the left breast upper outer quadrant, 2.6 cm by MRI, axilla negative, 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 Neoadjuvant chemotherapy withTaxol weekly x8Herceptin Perjeta (Perjeta discontinued due to diarrhea)every 3 weeksfollowed by Kadcylamaintenance for 1 year 2. 12/10/2018:Left lumpectomy ([facility]): IDC, 1.5cm, grade 2, HER-2 negative, ER+ 100%, PR+ 80%, Ki67 5%, clear margins, 2 lymph nodes negative for carcinoma. 3. Followed by adjuvant radiation therapystarted 01/21/2019 4.Followed by adjuvant  antiestrogen therapy ------------------------------------------------------------------------------------------------------------------------------------- Current treatment: Kadcyla maintenance started 01/31/2019 Kadcyla toxicities:No major side effects to Kadcyla. Wereducedthe dosage of Kadcyla because of anemia. Chemotherapy-induced anemia/iron deficiency anemia: Causing her fatigue, today's hemoglobin is 8.4.  Currently on IV iron with each treatment  8/18: adjuvant radiation 8/28: Kadcyla  Treatment: Kadcyla Echocardiogram:02/11/2019,EF 60 to 65%   Hypokalemia:Currently onpotassium twice a day. Hypomagnesemia: On magnesium replacement therapy.    Return to clinic in 3 weeks for next dose.    No orders of the defined types were placed in this encounter.  The patient has a good understanding of the overall plan. she agrees with it. she will call with any problems that may develop before the next visit here.  Nicholas Lose, MD 05/16/2019  Julious Oka Dorshimer, am acting  as scribe for Dr. Nicholas Lose.  I have reviewed the above document for accuracy and completeness, and I agree with the above.

## 2019-05-16 ENCOUNTER — Other Ambulatory Visit: Payer: Self-pay

## 2019-05-16 ENCOUNTER — Inpatient Hospital Stay (HOSPITAL_BASED_OUTPATIENT_CLINIC_OR_DEPARTMENT_OTHER): Payer: Medicare Other | Admitting: Hematology and Oncology

## 2019-05-16 ENCOUNTER — Inpatient Hospital Stay: Payer: Medicare Other

## 2019-05-16 ENCOUNTER — Inpatient Hospital Stay: Payer: Medicare Other | Attending: Hematology and Oncology

## 2019-05-16 DIAGNOSIS — D6481 Anemia due to antineoplastic chemotherapy: Secondary | ICD-10-CM | POA: Insufficient documentation

## 2019-05-16 DIAGNOSIS — Z923 Personal history of irradiation: Secondary | ICD-10-CM | POA: Insufficient documentation

## 2019-05-16 DIAGNOSIS — Z170 Estrogen receptor positive status [ER+]: Secondary | ICD-10-CM

## 2019-05-16 DIAGNOSIS — C50412 Malignant neoplasm of upper-outer quadrant of left female breast: Secondary | ICD-10-CM | POA: Insufficient documentation

## 2019-05-16 DIAGNOSIS — E785 Hyperlipidemia, unspecified: Secondary | ICD-10-CM | POA: Insufficient documentation

## 2019-05-16 DIAGNOSIS — Z7901 Long term (current) use of anticoagulants: Secondary | ICD-10-CM | POA: Diagnosis not present

## 2019-05-16 DIAGNOSIS — E876 Hypokalemia: Secondary | ICD-10-CM | POA: Insufficient documentation

## 2019-05-16 DIAGNOSIS — Z95828 Presence of other vascular implants and grafts: Secondary | ICD-10-CM

## 2019-05-16 DIAGNOSIS — Z5112 Encounter for antineoplastic immunotherapy: Secondary | ICD-10-CM | POA: Diagnosis not present

## 2019-05-16 DIAGNOSIS — Z79899 Other long term (current) drug therapy: Secondary | ICD-10-CM | POA: Insufficient documentation

## 2019-05-16 DIAGNOSIS — E119 Type 2 diabetes mellitus without complications: Secondary | ICD-10-CM | POA: Diagnosis not present

## 2019-05-16 DIAGNOSIS — R5383 Other fatigue: Secondary | ICD-10-CM | POA: Insufficient documentation

## 2019-05-16 DIAGNOSIS — Z794 Long term (current) use of insulin: Secondary | ICD-10-CM | POA: Diagnosis not present

## 2019-05-16 DIAGNOSIS — R197 Diarrhea, unspecified: Secondary | ICD-10-CM | POA: Diagnosis not present

## 2019-05-16 LAB — CBC WITH DIFFERENTIAL (CANCER CENTER ONLY)
Abs Immature Granulocytes: 0.03 10*3/uL (ref 0.00–0.07)
Basophils Absolute: 0 10*3/uL (ref 0.0–0.1)
Basophils Relative: 1 %
Eosinophils Absolute: 0.1 10*3/uL (ref 0.0–0.5)
Eosinophils Relative: 2 %
HCT: 28.2 % — ABNORMAL LOW (ref 36.0–46.0)
Hemoglobin: 8.4 g/dL — ABNORMAL LOW (ref 12.0–15.0)
Immature Granulocytes: 0 %
Lymphocytes Relative: 8 %
Lymphs Abs: 0.6 10*3/uL — ABNORMAL LOW (ref 0.7–4.0)
MCH: 22.2 pg — ABNORMAL LOW (ref 26.0–34.0)
MCHC: 29.8 g/dL — ABNORMAL LOW (ref 30.0–36.0)
MCV: 74.6 fL — ABNORMAL LOW (ref 80.0–100.0)
Monocytes Absolute: 0.6 10*3/uL (ref 0.1–1.0)
Monocytes Relative: 8 %
Neutro Abs: 6.4 10*3/uL (ref 1.7–7.7)
Neutrophils Relative %: 81 %
Platelet Count: 156 10*3/uL (ref 150–400)
RBC: 3.78 MIL/uL — ABNORMAL LOW (ref 3.87–5.11)
RDW: 19.9 % — ABNORMAL HIGH (ref 11.5–15.5)
WBC Count: 7.9 10*3/uL (ref 4.0–10.5)
nRBC: 0 % (ref 0.0–0.2)

## 2019-05-16 LAB — CMP (CANCER CENTER ONLY)
ALT: 11 U/L (ref 0–44)
AST: 24 U/L (ref 15–41)
Albumin: 2.7 g/dL — ABNORMAL LOW (ref 3.5–5.0)
Alkaline Phosphatase: 159 U/L — ABNORMAL HIGH (ref 38–126)
Anion gap: 10 (ref 5–15)
BUN: 20 mg/dL (ref 8–23)
CO2: 25 mmol/L (ref 22–32)
Calcium: 8.3 mg/dL — ABNORMAL LOW (ref 8.9–10.3)
Chloride: 102 mmol/L (ref 98–111)
Creatinine: 0.82 mg/dL (ref 0.44–1.00)
GFR, Est AFR Am: >60 mL/min (ref >60)
GFR, Estimated: >60 mL/min (ref >60)
Glucose, Bld: 223 mg/dL — ABNORMAL HIGH (ref 70–99)
Potassium: 4.4 mmol/L (ref 3.5–5.1)
Sodium: 137 mmol/L (ref 135–145)
Total Bilirubin: 0.7 mg/dL (ref 0.3–1.2)
Total Protein: 6.9 g/dL (ref 6.5–8.1)

## 2019-05-16 MED ORDER — SODIUM CHLORIDE 0.9% FLUSH
10.0000 mL | INTRAVENOUS | Status: DC | PRN
  Administered 2019-05-16: 12:00:00 10 mL
  Filled 2019-05-16: qty 10

## 2019-05-16 MED ORDER — SODIUM CHLORIDE 0.9 % IV SOLN
Freq: Once | INTRAVENOUS | Status: AC
  Administered 2019-05-16: 10:00:00 via INTRAVENOUS
  Filled 2019-05-16: qty 250

## 2019-05-16 MED ORDER — DIPHENHYDRAMINE HCL 25 MG PO CAPS
ORAL_CAPSULE | ORAL | Status: AC
  Filled 2019-05-16: qty 2

## 2019-05-16 MED ORDER — SODIUM CHLORIDE 0.9 % IV SOLN
2.9000 mg/kg | Freq: Once | INTRAVENOUS | Status: AC
  Administered 2019-05-16: 360 mg via INTRAVENOUS
  Filled 2019-05-16: qty 10

## 2019-05-16 MED ORDER — DIPHENHYDRAMINE HCL 25 MG PO CAPS
50.0000 mg | ORAL_CAPSULE | Freq: Once | ORAL | Status: AC
  Administered 2019-05-16: 50 mg via ORAL

## 2019-05-16 MED ORDER — SODIUM CHLORIDE 0.9% FLUSH
10.0000 mL | Freq: Once | INTRAVENOUS | Status: AC
  Administered 2019-05-16: 09:00:00 10 mL
  Filled 2019-05-16: qty 10

## 2019-05-16 MED ORDER — HEPARIN SOD (PORK) LOCK FLUSH 100 UNIT/ML IV SOLN
500.0000 [IU] | Freq: Once | INTRAVENOUS | Status: AC | PRN
  Administered 2019-05-16: 12:00:00 500 [IU]
  Filled 2019-05-16: qty 5

## 2019-05-16 MED ORDER — ACETAMINOPHEN 325 MG PO TABS
650.0000 mg | ORAL_TABLET | Freq: Once | ORAL | Status: AC
  Administered 2019-05-16: 10:00:00 650 mg via ORAL

## 2019-05-16 MED ORDER — ACETAMINOPHEN 325 MG PO TABS
ORAL_TABLET | ORAL | Status: AC
  Filled 2019-05-16: qty 2

## 2019-05-16 MED ORDER — SODIUM CHLORIDE 0.9 % IV SOLN
200.0000 mg | Freq: Once | INTRAVENOUS | Status: AC
  Administered 2019-05-16: 11:00:00 200 mg via INTRAVENOUS
  Filled 2019-05-16: qty 10

## 2019-05-16 NOTE — Assessment & Plan Note (Signed)
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 Neoadjuvant chemotherapy withTaxol weekly x8Herceptin Perjeta (Perjeta discontinued due to diarrhea)every 3 weeksfollowed by Kadcylamaintenance for 1 year 2. 12/10/2018:Left lumpectomy ([facility]): IDC, 1.5cm, grade 2, HER-2 negative, ER+ 100%, PR+ 80%, Ki67 5%, clear margins, 2 lymph nodes negative for carcinoma. 3. Followed by adjuvant radiation therapystarted 01/21/2019 4.Followed by adjuvant antiestrogen therapy ------------------------------------------------------------------------------------------------------------------------------------- Current treatment: Kadcyla maintenance started 01/31/2019 Kadcyla toxicities:No major side effects to Kadcyla. Wereducedthe dosage of Kadcyla because of anemia. Chemotherapy-induced anemia causing her fatigue, today's hemoglobin is 8.5 and is stable.  8/18: adjuvant radiation 8/28: Kadcyla  Treatment: Kadcyla Echocardiogram:02/11/2019,EF 60 to 65%   Hypokalemia:Currently onpotassium twice a day. Hypomagnesemia: On magnesium replacement therapy. Iron deficiency anemia: Venofer started 04/25/2019 1 dose with each infusion  Return to clinic in 3 weeks for next dose.

## 2019-05-16 NOTE — Patient Instructions (Signed)

## 2019-05-16 NOTE — Patient Instructions (Signed)
[facility] Discharge Instructions for Patients Receiving Chemotherapy  Today you received the following chemotherapy agent: Ado-Trastuzumab Emtansine Illene Bolus). And Venofer.  To help prevent nausea and vomiting after your treatment, we encourage you to take your nausea medication.   If you develop nausea and vomiting that is not controlled by your nausea medication, call the clinic.   BELOW ARE SYMPTOMS THAT SHOULD BE REPORTED IMMEDIATELY:  *FEVER GREATER THAN 100.5 F  *CHILLS WITH OR WITHOUT FEVER  NAUSEA AND VOMITING THAT IS NOT CONTROLLED WITH YOUR NAUSEA MEDICATION  *UNUSUAL SHORTNESS OF BREATH  *UNUSUAL BRUISING OR BLEEDING  TENDERNESS IN MOUTH AND THROAT WITH OR WITHOUT PRESENCE OF ULCERS  *URINARY PROBLEMS  *BOWEL PROBLEMS  UNUSUAL RASH Items with * indicate a potential emergency and should be followed up as soon as possible.  Feel free to call the clinic should you have any questions or concerns. The clinic phone number is [phone].  Please show the [facility] at check-in to the Emergency Department and triage nurse.  Iron Sucrose injection What is this medicine? IRON SUCROSE (AHY ern SOO krohs) is an iron complex. Iron is used to make healthy red blood cells, which carry oxygen and nutrients throughout the body. This medicine is used to treat iron deficiency anemia in people with chronic kidney disease. This medicine may be used for other purposes; ask your health care provider or pharmacist if you have questions. COMMON BRAND NAME(S): Venofer What should I tell my health care provider before I take this medicine? They need to know if you have any of these conditions:  anemia not caused by low iron levels  heart disease  high levels of iron in the blood  kidney disease  liver disease  an unusual or allergic reaction to iron, other medicines, foods, dyes, or preservatives  pregnant or trying to get  pregnant  breast-feeding How should I use this medicine? This medicine is for infusion into a vein. It is given by a health care professional in a hospital or clinic setting. Talk to your pediatrician regarding the use of this medicine in children. While this drug may be prescribed for children as young as 2 years for selected conditions, precautions do apply. Overdosage: If you think you have taken too much of this medicine contact a poison control center or emergency room at once. NOTE: This medicine is only for you. Do not share this medicine with others. What if I miss a dose? It is important not to miss your dose. Call your doctor or health care professional if you are unable to keep an appointment. What may interact with this medicine? Do not take this medicine with any of the following medications:  deferoxamine  dimercaprol  other iron products This medicine may also interact with the following medications:  chloramphenicol  deferasirox This list may not describe all possible interactions. Give your health care provider a list of all the medicines, herbs, non-prescription drugs, or dietary supplements you use. Also tell them if you smoke, drink alcohol, or use illegal drugs. Some items may interact with your medicine. What should I watch for while using this medicine? Visit your doctor or healthcare professional regularly. Tell your doctor or healthcare professional if your symptoms do not start to get better or if they get worse. You may need blood work done while you are taking this medicine. You may need to follow a special diet. Talk to your doctor. Foods that contain iron include: whole grains/cereals, dried  fruits, beans, or peas, leafy green vegetables, and organ meats (liver, kidney). What side effects may I notice from receiving this medicine? Side effects that you should report to your doctor or health care professional as soon as possible:  allergic reactions like  skin rash, itching or hives, swelling of the face, lips, or tongue  breathing problems  changes in blood pressure  cough  fast, irregular heartbeat  feeling faint or lightheaded, falls  fever or chills  flushing, sweating, or hot feelings  joint or muscle aches/pains  seizures  swelling of the ankles or feet  unusually weak or tired Side effects that usually do not require medical attention (report to your doctor or health care professional if they continue or are bothersome):  diarrhea  feeling achy  headache  irritation at site where injected  nausea, vomiting  stomach upset  tiredness This list may not describe all possible side effects. Call your doctor for medical advice about side effects. You may report side effects to FDA at 1-800-FDA-1088. Where should I keep my medicine? This drug is given in a hospital or clinic and will not be stored at home. NOTE: This sheet is a summary. It may not cover all possible information. If you have questions about this medicine, talk to your doctor, pharmacist, or health care provider.  2020 Elsevier/Gold Standard (2011-03-02 17:14:35)

## 2019-05-20 ENCOUNTER — Encounter: Payer: Self-pay | Admitting: Hematology and Oncology

## 2019-05-20 DIAGNOSIS — Z483 Aftercare following surgery for neoplasm: Secondary | ICD-10-CM | POA: Diagnosis not present

## 2019-05-20 DIAGNOSIS — I890 Lymphedema, not elsewhere classified: Secondary | ICD-10-CM | POA: Diagnosis not present

## 2019-05-20 DIAGNOSIS — I130 Hypertensive heart and chronic kidney disease with heart failure and stage 1 through stage 4 chronic kidney disease, or unspecified chronic kidney disease: Secondary | ICD-10-CM | POA: Diagnosis not present

## 2019-05-20 DIAGNOSIS — C50912 Malignant neoplasm of unspecified site of left female breast: Secondary | ICD-10-CM | POA: Diagnosis not present

## 2019-05-20 DIAGNOSIS — E1122 Type 2 diabetes mellitus with diabetic chronic kidney disease: Secondary | ICD-10-CM | POA: Diagnosis not present

## 2019-05-20 DIAGNOSIS — I5033 Acute on chronic diastolic (congestive) heart failure: Secondary | ICD-10-CM | POA: Diagnosis not present

## 2019-05-20 NOTE — Telephone Encounter (Signed)
Please look into the assistance and her eliquis. For allergies/sinuses she wants to avoid anything with a decongestant in it (phenylephrine or pseudoephredirine). Claritin, zyrtec, allegra are all fine, want to avoid the versions that have "D" on the end standing for decongestants. Nasal sprays like flonase are fine.    Carlyle Dolly MD

## 2019-05-21 DIAGNOSIS — N189 Chronic kidney disease, unspecified: Secondary | ICD-10-CM | POA: Diagnosis not present

## 2019-05-21 DIAGNOSIS — I890 Lymphedema, not elsewhere classified: Secondary | ICD-10-CM | POA: Diagnosis not present

## 2019-05-21 DIAGNOSIS — E1122 Type 2 diabetes mellitus with diabetic chronic kidney disease: Secondary | ICD-10-CM | POA: Diagnosis not present

## 2019-05-21 DIAGNOSIS — C50912 Malignant neoplasm of unspecified site of left female breast: Secondary | ICD-10-CM | POA: Diagnosis not present

## 2019-05-21 DIAGNOSIS — I4891 Unspecified atrial fibrillation: Secondary | ICD-10-CM | POA: Diagnosis not present

## 2019-05-21 DIAGNOSIS — F419 Anxiety disorder, unspecified: Secondary | ICD-10-CM | POA: Diagnosis not present

## 2019-05-21 DIAGNOSIS — I872 Venous insufficiency (chronic) (peripheral): Secondary | ICD-10-CM | POA: Diagnosis not present

## 2019-05-21 DIAGNOSIS — I130 Hypertensive heart and chronic kidney disease with heart failure and stage 1 through stage 4 chronic kidney disease, or unspecified chronic kidney disease: Secondary | ICD-10-CM | POA: Diagnosis not present

## 2019-05-21 DIAGNOSIS — Z483 Aftercare following surgery for neoplasm: Secondary | ICD-10-CM | POA: Diagnosis not present

## 2019-05-21 DIAGNOSIS — I272 Pulmonary hypertension, unspecified: Secondary | ICD-10-CM | POA: Diagnosis not present

## 2019-05-21 DIAGNOSIS — I5033 Acute on chronic diastolic (congestive) heart failure: Secondary | ICD-10-CM | POA: Diagnosis not present

## 2019-05-28 DIAGNOSIS — I5033 Acute on chronic diastolic (congestive) heart failure: Secondary | ICD-10-CM | POA: Diagnosis not present

## 2019-05-28 DIAGNOSIS — Z483 Aftercare following surgery for neoplasm: Secondary | ICD-10-CM | POA: Diagnosis not present

## 2019-05-28 DIAGNOSIS — C50912 Malignant neoplasm of unspecified site of left female breast: Secondary | ICD-10-CM | POA: Diagnosis not present

## 2019-05-28 DIAGNOSIS — I890 Lymphedema, not elsewhere classified: Secondary | ICD-10-CM | POA: Diagnosis not present

## 2019-05-28 DIAGNOSIS — I130 Hypertensive heart and chronic kidney disease with heart failure and stage 1 through stage 4 chronic kidney disease, or unspecified chronic kidney disease: Secondary | ICD-10-CM | POA: Diagnosis not present

## 2019-05-28 DIAGNOSIS — E1122 Type 2 diabetes mellitus with diabetic chronic kidney disease: Secondary | ICD-10-CM | POA: Diagnosis not present

## 2019-06-04 DIAGNOSIS — C50912 Malignant neoplasm of unspecified site of left female breast: Secondary | ICD-10-CM | POA: Diagnosis not present

## 2019-06-04 DIAGNOSIS — I130 Hypertensive heart and chronic kidney disease with heart failure and stage 1 through stage 4 chronic kidney disease, or unspecified chronic kidney disease: Secondary | ICD-10-CM | POA: Diagnosis not present

## 2019-06-04 DIAGNOSIS — Z483 Aftercare following surgery for neoplasm: Secondary | ICD-10-CM | POA: Diagnosis not present

## 2019-06-04 DIAGNOSIS — I5033 Acute on chronic diastolic (congestive) heart failure: Secondary | ICD-10-CM | POA: Diagnosis not present

## 2019-06-04 DIAGNOSIS — E1122 Type 2 diabetes mellitus with diabetic chronic kidney disease: Secondary | ICD-10-CM | POA: Diagnosis not present

## 2019-06-04 DIAGNOSIS — I890 Lymphedema, not elsewhere classified: Secondary | ICD-10-CM | POA: Diagnosis not present

## 2019-06-05 ENCOUNTER — Encounter: Payer: Self-pay | Admitting: *Deleted

## 2019-06-05 ENCOUNTER — Inpatient Hospital Stay: Payer: Medicare Other | Admitting: Hematology and Oncology

## 2019-06-05 ENCOUNTER — Inpatient Hospital Stay: Payer: Medicare Other

## 2019-06-05 ENCOUNTER — Encounter: Payer: Self-pay | Admitting: Hematology and Oncology

## 2019-06-05 NOTE — Assessment & Plan Note (Deleted)
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 Neoadjuvant chemotherapy withTaxol weekly x8Herceptin Perjeta (Perjeta discontinued due to diarrhea)every 3 weeksfollowed by Kadcylamaintenance for 1 year 2. 12/10/2018:Left lumpectomy ([facility]): IDC, 1.5cm, grade 2, HER-2 negative, ER+ 100%, PR+ 80%, Ki67 5%, clear margins, 2 lymph nodes negative for carcinoma. 3. Followed by adjuvant radiation therapystarted 01/21/2019 4.Followed by adjuvant antiestrogen therapy ------------------------------------------------------------------------------------------------------------------------------------- Current treatment: Kadcyla maintenance started 01/31/2019 Kadcyla toxicities:No major side effects to Kadcyla. Wereducedthe dosage of Kadcyla because of anemia. Chemotherapy-induced anemia/iron deficiency anemia: Causing her fatigue, today's hemoglobin is 8.4.  Currently on IV iron with each treatment  8/18: adjuvant radiation 8/28: Kadcyla  Treatment: Kadcyla Echocardiogram:02/11/2019,EF 60 to 65%  Hypokalemia:Currently onpotassium twice a day. Hypomagnesemia: On magnesium replacement therapy.    Return to clinic in 3 weeks for next dose.

## 2019-06-09 ENCOUNTER — Telehealth: Payer: Self-pay | Admitting: *Deleted

## 2019-06-09 NOTE — Telephone Encounter (Signed)
Pt assistance forms faxed to [facility]

## 2019-06-10 ENCOUNTER — Telehealth: Payer: Self-pay | Admitting: Hematology and Oncology

## 2019-06-10 ENCOUNTER — Encounter: Payer: Self-pay | Admitting: *Deleted

## 2019-06-10 NOTE — Telephone Encounter (Signed)
Patient's daughter returned phone call regarding voicemail that was left, rescheduled 12/31 cancelled appointment to 01/08. Rescheduled patient's further appointments as well.

## 2019-06-10 NOTE — Telephone Encounter (Signed)
Called patient to reschedule missed 12/31 appointments, left a voicemail.

## 2019-06-12 DIAGNOSIS — C50912 Malignant neoplasm of unspecified site of left female breast: Secondary | ICD-10-CM | POA: Diagnosis not present

## 2019-06-12 DIAGNOSIS — I130 Hypertensive heart and chronic kidney disease with heart failure and stage 1 through stage 4 chronic kidney disease, or unspecified chronic kidney disease: Secondary | ICD-10-CM | POA: Diagnosis not present

## 2019-06-12 DIAGNOSIS — I5033 Acute on chronic diastolic (congestive) heart failure: Secondary | ICD-10-CM | POA: Diagnosis not present

## 2019-06-12 DIAGNOSIS — I890 Lymphedema, not elsewhere classified: Secondary | ICD-10-CM | POA: Diagnosis not present

## 2019-06-12 DIAGNOSIS — E1122 Type 2 diabetes mellitus with diabetic chronic kidney disease: Secondary | ICD-10-CM | POA: Diagnosis not present

## 2019-06-12 DIAGNOSIS — Z483 Aftercare following surgery for neoplasm: Secondary | ICD-10-CM | POA: Diagnosis not present

## 2019-06-12 NOTE — Progress Notes (Signed)
Patient Care Team: [name], MD as PCP - General (Family Medicine) Harl Bowie, Alphonse Guild, MD as PCP - Cardiology (Cardiology) Mauro Kaufmann, RN as Oncology Nurse Navigator Rockwell Germany, RN as Oncology Nurse Navigator  DIAGNOSIS:    ICD-10-CM   1. Malignant neoplasm of upper-outer quadrant of left breast in female, estrogen receptor positive ([place])  C50.412    Z17.0     SUMMARY OF ONCOLOGIC HISTORY: Oncology History  Malignant neoplasm of upper-outer quadrant of left breast in female, estrogen receptor positive ([place])  07/09/2018 Initial Diagnosis   Screening mammogram detected spiculated mass in the left breast upper outer quadrant, 2.6 cm by MRI, axilla negative, biopsy revealed grade 2 IDC with DCIS, ER 100%, PR 70%, Ki-67 10%, HER-2 3+ positive, T2N0 stage Ib clinical stage   07/25/2018 Cancer Staging   Staging form: Breast, AJCC 8th Edition - Clinical stage from 07/25/2018: Stage IB (cT2, cN0, cM0, G2, ER+, PR+, HER2+) - Signed by Nicholas Lose, MD on 07/25/2018   08/02/2018 -  Neo-Adjuvant Chemotherapy   Neoadjuvant chemotherapy with Taxol weekly with Herceptin and Perjeta every 3 weeks   10/13/2018 Genetic Testing   Negative genetic testing on the multicancer panel.  The Multi-Gene Panel offered by [facility] includes sequencing and/or deletion duplication testing of the following 85 genes: AIP, ALK, APC, ATM, AXIN2,BAP1,  BARD1, BLM, BMPR1A, BRCA1, BRCA2, BRIP1, CASR, CDC73, CDH1, CDK4, CDKN1B, CDKN1C, CDKN2A (p14ARF), CDKN2A (p16INK4a), CEBPA, CHEK2, CTNNA1, DICER1, DIS3L2, EGFR (c.2369C>T, p.Thr790Met variant only), EPCAM (Deletion/duplication testing only), FH, FLCN, GATA2, GPC3, GREM1 (Promoter region deletion/duplication testing only), HOXB13 (c.251G>A, p.Gly84Glu), HRAS, KIT, MAX, MEN1, MET, MITF (c.952G>A, p.Glu318Lys variant only), MLH1, MSH2, MSH3, MSH6, MUTYH, NBN, NF1, NF2, NTHL1, PALB2, PDGFRA, PHOX2B, PMS2, POLD1, POLE, POT1, PRKAR1A, PTCH1, PTEN, RAD50, RAD51C,  RAD51D, RB1, RECQL4, RET, RNF43, RUNX1, SDHAF2, SDHA (sequence changes only), SDHB, SDHC, SDHD, SMAD4, SMARCA4, SMARCB1, SMARCE1, STK11, SUFU, TERC, TERT, TMEM127, TP53, TSC1, TSC2, VHL, WRN and WT1.  The report date is Oct 13, 2018.   12/10/2018 Surgery   Left lumpectomy ([facility]): IDC, 1.5cm, grade 2, HER-2 negative, ER+ 100%, PR+ 80%, Ki67 5%, clear margins, 2 lymph nodes negative for carcinoma.    12/10/2018 Cancer Staging   Staging form: Breast, AJCC 8th Edition - Pathologic stage from 12/10/2018: No Stage Recommended (ypT1c, pN0, cM0, G2, ER+, PR+, HER2-) - Signed by Gardenia Phlegm, NP on 12/18/2018   01/21/2019 - 02/18/2019 Radiation Therapy   Adjuvant radiation   01/31/2019 -  Chemotherapy   The patient had ado-trastuzumab emtansine (KADCYLA) 460 mg in sodium chloride 0.9 % 250 mL chemo infusion, 3.6 mg/kg = 460 mg, Intravenous, Once, 6 of 11 cycles Dose modification: 3 mg/kg (original dose 3.6 mg/kg, Cycle 2, Reason: Dose not tolerated) Administration: 460 mg (01/31/2019), 360 mg (02/21/2019), 360 mg (04/25/2019), 360 mg (05/16/2019), 360 mg (03/14/2019), 360 mg (04/04/2019)  for chemotherapy treatment.      CHIEF COMPLIANT: Kadcyla maintenance  INTERVAL HISTORY: Destiny Nash is a 74 y.o. with above-mentioned history of left breast cancer who underwent neoadjuvant chemotherapy,a left lumpectomy, and completed radiation. She is currently on Kadcyla maintenance.She presents to the clinic todayfor treatment.  She continues to have mild fatigue and uses a wheelchair for ambulation.  Denies any nausea or vomiting.  Mild leg swelling which is chronic in nature.  ALLERGIES:  is allergic to levaquin [levofloxacin] and codeine.  MEDICATIONS:  Current Outpatient Medications  Medication Sig Dispense Refill  . acetaminophen (TYLENOL) 500 MG tablet Take 1,000  mg by mouth every 6 (six) hours as needed for moderate pain or headache.    . albuterol (PROVENTIL HFA;VENTOLIN HFA) 108 (90  Base) MCG/ACT inhaler Inhale 2 puffs into the lungs every 6 (six) hours as needed for wheezing or shortness of breath.    Marland Kitchen apixaban (ELIQUIS) 5 MG TABS tablet Take 1 tablet (5 mg total) by mouth 2 (two) times daily. 60 tablet 0  . cholestyramine (QUESTRAN) 4 g packet Take 1 packet (4 g total) by mouth 3 (three) times daily with meals. (Patient taking differently: Take 4 g by mouth daily as needed. ) 60 each 12  . CINNAMON PO Take 1 tablet by mouth daily.    Marland Kitchen diltiazem (CARDIZEM CD) 300 MG 24 hr capsule Take 1 capsule (300 mg total) by mouth daily. 90 capsule 3  . diphenoxylate-atropine (LOMOTIL) 2.5-0.025 MG tablet Take 1 tablet by mouth 4 (four) times daily as needed for diarrhea or loose stools. 120 tablet 3  . furosemide (LASIX) 40 MG tablet Take 1.5 tablets (60 mg total) by mouth daily. 135 tablet 3  . insulin detemir (LEVEMIR) 100 UNIT/ML injection Inject 0.11 mLs (11 Units total) into the skin at bedtime. (Patient taking differently: Inject 8 Units into the skin at bedtime. )  0  . LORazepam (ATIVAN) 1 MG tablet Take 1 tablet 30 min before radiotherapy, PRN anxiety and claustrophobia. 30 tablet 3  . Magnesium Oxide 400 MG CAPS Take 1 capsule (400 mg total) by mouth daily. 90 capsule 3  . metFORMIN (GLUCOPHAGE) 500 MG tablet Take 1,000 mg by mouth 2 (two) times daily with a meal.     . niacin 500 MG tablet Take 500 mg by mouth daily.    . NYSTATIN powder Apply 1 Bottle topically daily as needed (irritation).     . Omega-3 1000 MG CAPS Take 1,000 mg by mouth 2 (two) times daily.    . ondansetron (ZOFRAN) 8 MG tablet Take 1 tablet (8 mg total) by mouth 2 (two) times daily as needed (Nausea or vomiting). 30 tablet 1  . pantoprazole (PROTONIX) 40 MG tablet Take 1 tablet by mouth once daily 90 tablet 0  . potassium chloride SA (KLOR-CON) 20 MEQ tablet Take 1 tablet (20 mEq total) by mouth 2 (two) times daily. 60 tablet 2  . pravastatin (PRAVACHOL) 20 MG tablet Take 20 mg by mouth daily before  breakfast.   0  . prochlorperazine (COMPAZINE) 10 MG tablet Take 1 tablet (10 mg total) by mouth every 6 (six) hours as needed (Nausea or vomiting). 30 tablet 1  . traMADol (ULTRAM) 50 MG tablet Take 1 tablet (50 mg total) by mouth every 6 (six) hours as needed. 20 tablet 0   No current facility-administered medications for this visit.    PHYSICAL EXAMINATION: ECOG PERFORMANCE STATUS: 2 - Symptomatic, <50% confined to bed  Vitals:   06/13/19 1035  BP: 111/82  Pulse: 79  Resp: 18  Temp: 97.7 F (36.5 C)  SpO2: 100%   Filed Weights    LABORATORY DATA:  I have reviewed the data as listed CMP Latest Ref Rng & Units 05/16/2019 04/25/2019 04/04/2019  Glucose 70 - 99 mg/dL 223(H) 186(H) 173(H)  BUN 8 - 23 mg/dL _0 Creatinine 0.44 - 1.00 mg/dL 0.82 0.86 0.81  Sodium 135 - 145 mmol/L 137 136 138  Potassium 3.5 - 5.1 mmol/L 4.4 4.6 4.1  Chloride 98 - 111 mmol/L 102 103 102  CO2 22 - 32 mmol/L 25  24 25  Calcium 8.9 - 10.3 mg/dL 8.3(L) 8.7(L) 9.1  Total Protein 6.5 - 8.1 g/dL 6.9 6.9 7.1  Total Bilirubin 0.3 - 1.2 mg/dL 0.7 0.6 0.5  Alkaline Phos 38 - 126 U/L 159(H) 140(H) 160(H)  AST 15 - 41 U/L _0 ALT 0 - 44 U/L _1 Lab Results  Component Value Date   WBC 9.9 06/13/2019   HGB 8.5 (L) 06/13/2019   HCT 28.5 (L) 06/13/2019   MCV 70.9 (L) 06/13/2019   PLT 137 (L) 06/13/2019   NEUTROABS 7.7 06/13/2019    ASSESSMENT & PLAN:  Malignant neoplasm of upper-outer quadrant of left breast in female, estrogen receptor positive ([place]) 07/09/2018:Screening mammogram detected spiculated mass in the left breast upper outer quadrant, 2.6 cm by MRI, axilla negative, 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 Neoadjuvant chemotherapy withTaxol weekly x8Herceptin Perjeta (Perjeta discontinued due to diarrhea)every 3 weeksfollowed by Kadcylamaintenance for 1 year 2.  12/10/2018:Left lumpectomy ([facility]): IDC, 1.5cm, grade 2, HER-2 negative, ER+ 100%, PR+ 80%, Ki67 5%, clear margins, 2 lymph nodes negative for carcinoma. 3. Followed by adjuvant radiation therapystarted 01/21/2019 4.Followed by adjuvant antiestrogen therapy ------------------------------------------------------------------------------------------------------------------------------------- Current treatment: Kadcyla maintenance started 01/31/2019 Kadcyla toxicities:No major side effects to Kadcyla. 1. Chemotherapy-induced anemia/iron deficiency anemia: Today's hemoglobin is 8.5 and it is microcytic Currently receiving IV iron with each infusion of Kadcyla. 2.  Chemotherapy-induced fatigue 3.  Loose stools and decreased appetite  Denies neuropathy Echocardiogram:02/11/2019,EF 60 to 65%, patient will need a new echocardiogram.  Hypokalemia:On potassium twice daily.  Intermittent loose stools is the cause of the hypokalemia. Hypomagnesemia: On magnesium replacement.  She has 4 more treatments left after today.  Return to clinic in 3 weeks for next dose.    No orders of the defined types were placed in this encounter.  The patient has a good understanding of the overall plan. she agrees with it. she will call with any problems that may develop before the next visit here.  Total time spent: 30 mins including face to face time and time spent for planning, charting and coordination of care  Nicholas Lose, MD 06/13/2019  I, Cloyde Reams Dorshimer, am acting as scribe for Dr. Nicholas Lose.  I have reviewed the above documentation for accuracy and completeness, and I agree with the above.

## 2019-06-13 ENCOUNTER — Inpatient Hospital Stay (HOSPITAL_BASED_OUTPATIENT_CLINIC_OR_DEPARTMENT_OTHER): Payer: Medicare Other | Admitting: Hematology and Oncology

## 2019-06-13 ENCOUNTER — Inpatient Hospital Stay: Payer: Medicare Other

## 2019-06-13 ENCOUNTER — Other Ambulatory Visit: Payer: Self-pay

## 2019-06-13 ENCOUNTER — Inpatient Hospital Stay: Payer: Medicare Other | Attending: Hematology and Oncology

## 2019-06-13 ENCOUNTER — Telehealth: Payer: Self-pay | Admitting: Hematology and Oncology

## 2019-06-13 VITALS — BP 121/53 | HR 79 | Temp 97.8°F | Resp 18

## 2019-06-13 DIAGNOSIS — E119 Type 2 diabetes mellitus without complications: Secondary | ICD-10-CM | POA: Insufficient documentation

## 2019-06-13 DIAGNOSIS — R5383 Other fatigue: Secondary | ICD-10-CM | POA: Diagnosis not present

## 2019-06-13 DIAGNOSIS — C50412 Malignant neoplasm of upper-outer quadrant of left female breast: Secondary | ICD-10-CM

## 2019-06-13 DIAGNOSIS — Z923 Personal history of irradiation: Secondary | ICD-10-CM | POA: Diagnosis not present

## 2019-06-13 DIAGNOSIS — D6481 Anemia due to antineoplastic chemotherapy: Secondary | ICD-10-CM | POA: Insufficient documentation

## 2019-06-13 DIAGNOSIS — E876 Hypokalemia: Secondary | ICD-10-CM | POA: Diagnosis not present

## 2019-06-13 DIAGNOSIS — M7989 Other specified soft tissue disorders: Secondary | ICD-10-CM | POA: Diagnosis not present

## 2019-06-13 DIAGNOSIS — Z7901 Long term (current) use of anticoagulants: Secondary | ICD-10-CM | POA: Insufficient documentation

## 2019-06-13 DIAGNOSIS — Z170 Estrogen receptor positive status [ER+]: Secondary | ICD-10-CM | POA: Diagnosis not present

## 2019-06-13 DIAGNOSIS — D500 Iron deficiency anemia secondary to blood loss (chronic): Secondary | ICD-10-CM | POA: Insufficient documentation

## 2019-06-13 DIAGNOSIS — Z5112 Encounter for antineoplastic immunotherapy: Secondary | ICD-10-CM | POA: Insufficient documentation

## 2019-06-13 DIAGNOSIS — Z79899 Other long term (current) drug therapy: Secondary | ICD-10-CM | POA: Diagnosis not present

## 2019-06-13 DIAGNOSIS — Z95828 Presence of other vascular implants and grafts: Secondary | ICD-10-CM

## 2019-06-13 DIAGNOSIS — Z794 Long term (current) use of insulin: Secondary | ICD-10-CM | POA: Insufficient documentation

## 2019-06-13 DIAGNOSIS — Z9221 Personal history of antineoplastic chemotherapy: Secondary | ICD-10-CM | POA: Diagnosis not present

## 2019-06-13 DIAGNOSIS — R200 Anesthesia of skin: Secondary | ICD-10-CM | POA: Diagnosis not present

## 2019-06-13 DIAGNOSIS — E785 Hyperlipidemia, unspecified: Secondary | ICD-10-CM | POA: Diagnosis not present

## 2019-06-13 DIAGNOSIS — D649 Anemia, unspecified: Secondary | ICD-10-CM

## 2019-06-13 LAB — CBC WITH DIFFERENTIAL (CANCER CENTER ONLY)
Abs Immature Granulocytes: 0.03 10*3/uL (ref 0.00–0.07)
Basophils Absolute: 0 10*3/uL (ref 0.0–0.1)
Basophils Relative: 0 %
Eosinophils Absolute: 0.2 10*3/uL (ref 0.0–0.5)
Eosinophils Relative: 2 %
HCT: 28.5 % — ABNORMAL LOW (ref 36.0–46.0)
Hemoglobin: 8.5 g/dL — ABNORMAL LOW (ref 12.0–15.0)
Immature Granulocytes: 0 %
Lymphocytes Relative: 12 %
Lymphs Abs: 1.1 10*3/uL (ref 0.7–4.0)
MCH: 21.1 pg — ABNORMAL LOW (ref 26.0–34.0)
MCHC: 29.8 g/dL — ABNORMAL LOW (ref 30.0–36.0)
MCV: 70.9 fL — ABNORMAL LOW (ref 80.0–100.0)
Monocytes Absolute: 0.8 10*3/uL (ref 0.1–1.0)
Monocytes Relative: 8 %
Neutro Abs: 7.7 10*3/uL (ref 1.7–7.7)
Neutrophils Relative %: 78 %
Platelet Count: 137 10*3/uL — ABNORMAL LOW (ref 150–400)
RBC: 4.02 MIL/uL (ref 3.87–5.11)
RDW: 21.6 % — ABNORMAL HIGH (ref 11.5–15.5)
WBC Count: 9.9 10*3/uL (ref 4.0–10.5)
nRBC: 0 % (ref 0.0–0.2)

## 2019-06-13 LAB — CMP (CANCER CENTER ONLY)
ALT: 13 U/L (ref 0–44)
AST: 21 U/L (ref 15–41)
Albumin: 2.7 g/dL — ABNORMAL LOW (ref 3.5–5.0)
Alkaline Phosphatase: 156 U/L — ABNORMAL HIGH (ref 38–126)
Anion gap: 9 (ref 5–15)
BUN: 31 mg/dL — ABNORMAL HIGH (ref 8–23)
CO2: 24 mmol/L (ref 22–32)
Calcium: 8.6 mg/dL — ABNORMAL LOW (ref 8.9–10.3)
Chloride: 104 mmol/L (ref 98–111)
Creatinine: 0.85 mg/dL (ref 0.44–1.00)
GFR, Est AFR Am: >60 mL/min (ref >60)
GFR, Estimated: >60 mL/min (ref >60)
Glucose, Bld: 102 mg/dL — ABNORMAL HIGH (ref 70–99)
Potassium: 5.1 mmol/L (ref 3.5–5.1)
Sodium: 137 mmol/L (ref 135–145)
Total Bilirubin: 0.7 mg/dL (ref 0.3–1.2)
Total Protein: 6.5 g/dL (ref 6.5–8.1)

## 2019-06-13 MED ORDER — SODIUM CHLORIDE 0.9 % IV SOLN
360.0000 mg | Freq: Once | INTRAVENOUS | Status: AC
  Administered 2019-06-13: 360 mg via INTRAVENOUS
  Filled 2019-06-13: qty 10

## 2019-06-13 MED ORDER — HEPARIN SOD (PORK) LOCK FLUSH 100 UNIT/ML IV SOLN
500.0000 [IU] | Freq: Once | INTRAVENOUS | Status: AC | PRN
  Administered 2019-06-13: 500 [IU]
  Filled 2019-06-13: qty 5

## 2019-06-13 MED ORDER — DIPHENHYDRAMINE HCL 25 MG PO CAPS
ORAL_CAPSULE | ORAL | Status: AC
  Filled 2019-06-13: qty 2

## 2019-06-13 MED ORDER — ACETAMINOPHEN 325 MG PO TABS
650.0000 mg | ORAL_TABLET | Freq: Once | ORAL | Status: AC
  Administered 2019-06-13: 650 mg via ORAL

## 2019-06-13 MED ORDER — DIPHENHYDRAMINE HCL 25 MG PO CAPS
50.0000 mg | ORAL_CAPSULE | Freq: Once | ORAL | Status: AC
  Administered 2019-06-13: 50 mg via ORAL

## 2019-06-13 MED ORDER — SODIUM CHLORIDE 0.9 % IV SOLN
200.0000 mg | Freq: Once | INTRAVENOUS | Status: AC
  Administered 2019-06-13: 200 mg via INTRAVENOUS
  Filled 2019-06-13: qty 200

## 2019-06-13 MED ORDER — SODIUM CHLORIDE 0.9 % IV SOLN
Freq: Once | INTRAVENOUS | Status: AC
  Filled 2019-06-13: qty 250

## 2019-06-13 MED ORDER — ACETAMINOPHEN 325 MG PO TABS
ORAL_TABLET | ORAL | Status: AC
  Filled 2019-06-13: qty 2

## 2019-06-13 MED ORDER — SODIUM CHLORIDE 0.9% FLUSH
10.0000 mL | INTRAVENOUS | Status: DC | PRN
  Administered 2019-06-13: 10 mL
  Filled 2019-06-13: qty 10

## 2019-06-13 MED ORDER — SODIUM CHLORIDE 0.9 % IV SOLN: 200.0000 mg | Freq: Once | INTRAVENOUS | Status: DC

## 2019-06-13 MED ORDER — SODIUM CHLORIDE 0.9% FLUSH
10.0000 mL | Freq: Once | INTRAVENOUS | Status: AC
  Administered 2019-06-13: 10 mL
  Filled 2019-06-13: qty 10

## 2019-06-13 NOTE — Patient Instructions (Signed)
[facility] Discharge Instructions for Patients Receiving Chemotherapy  Today you received the following chemotherapy agents: Kadcyla  To help prevent nausea and vomiting after your treatment, we encourage you to take your nausea medication as directed.   If you develop nausea and vomiting that is not controlled by your nausea medication, call the clinic.   BELOW ARE SYMPTOMS THAT SHOULD BE REPORTED IMMEDIATELY:  *FEVER GREATER THAN 100.5 F  *CHILLS WITH OR WITHOUT FEVER  NAUSEA AND VOMITING THAT IS NOT CONTROLLED WITH YOUR NAUSEA MEDICATION  *UNUSUAL SHORTNESS OF BREATH  *UNUSUAL BRUISING OR BLEEDING  TENDERNESS IN MOUTH AND THROAT WITH OR WITHOUT PRESENCE OF ULCERS  *URINARY PROBLEMS  *BOWEL PROBLEMS  UNUSUAL RASH Items with * indicate a potential emergency and should be followed up as soon as possible.  Feel free to call the clinic should you have any questions or concerns. The clinic phone number is [phone].  Please show the [facility] at check-in to the Emergency Department and triage nurse.  Iron Sucrose injection What is this medicine? IRON SUCROSE (AHY ern SOO krohs) is an iron complex. Iron is used to make healthy red blood cells, which carry oxygen and nutrients throughout the body. This medicine is used to treat iron deficiency anemia in people with chronic kidney disease. This medicine may be used for other purposes; ask your health care provider or pharmacist if you have questions. COMMON BRAND NAME(S): Venofer What should I tell my health care provider before I take this medicine? They need to know if you have any of these conditions:  anemia not caused by low iron levels  heart disease  high levels of iron in the blood  kidney disease  liver disease  an unusual or allergic reaction to iron, other medicines, foods, dyes, or preservatives  pregnant or trying to get pregnant  breast-feeding How should I use this  medicine? This medicine is for infusion into a vein. It is given by a health care professional in a hospital or clinic setting. Talk to your pediatrician regarding the use of this medicine in children. While this drug may be prescribed for children as young as 2 years for selected conditions, precautions do apply. Overdosage: If you think you have taken too much of this medicine contact a poison control center or emergency room at once. NOTE: This medicine is only for you. Do not share this medicine with others. What if I miss a dose? It is important not to miss your dose. Call your doctor or health care professional if you are unable to keep an appointment. What may interact with this medicine? Do not take this medicine with any of the following medications:  deferoxamine  dimercaprol  other iron products This medicine may also interact with the following medications:  chloramphenicol  deferasirox This list may not describe all possible interactions. Give your health care provider a list of all the medicines, herbs, non-prescription drugs, or dietary supplements you use. Also tell them if you smoke, drink alcohol, or use illegal drugs. Some items may interact with your medicine. What should I watch for while using this medicine? Visit your doctor or healthcare professional regularly. Tell your doctor or healthcare professional if your symptoms do not start to get better or if they get worse. You may need blood work done while you are taking this medicine. You may need to follow a special diet. Talk to your doctor. Foods that contain iron include: whole grains/cereals, dried fruits, beans,  or peas, leafy green vegetables, and organ meats (liver, kidney). What side effects may I notice from receiving this medicine? Side effects that you should report to your doctor or health care professional as soon as possible:  allergic reactions like skin rash, itching or hives, swelling of the face,  lips, or tongue  breathing problems  changes in blood pressure  cough  fast, irregular heartbeat  feeling faint or lightheaded, falls  fever or chills  flushing, sweating, or hot feelings  joint or muscle aches/pains  seizures  swelling of the ankles or feet  unusually weak or tired Side effects that usually do not require medical attention (report to your doctor or health care professional if they continue or are bothersome):  diarrhea  feeling achy  headache  irritation at site where injected  nausea, vomiting  stomach upset  tiredness This list may not describe all possible side effects. Call your doctor for medical advice about side effects. You may report side effects to FDA at 1-800-FDA-1088. Where should I keep my medicine? This drug is given in a hospital or clinic and will not be stored at home. NOTE: This sheet is a summary. It may not cover all possible information. If you have questions about this medicine, talk to your doctor, pharmacist, or health care provider.  2020 Elsevier/Gold Standard (2011-03-02 17:14:35)

## 2019-06-13 NOTE — Assessment & Plan Note (Signed)
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 Neoadjuvant chemotherapy withTaxol weekly x8Herceptin Perjeta (Perjeta discontinued due to diarrhea)every 3 weeksfollowed by Kadcylamaintenance for 1 year 2. 12/10/2018:Left lumpectomy ([facility]): IDC, 1.5cm, grade 2, HER-2 negative, ER+ 100%, PR+ 80%, Ki67 5%, clear margins, 2 lymph nodes negative for carcinoma. 3. Followed by adjuvant radiation therapystarted 01/21/2019 4.Followed by adjuvant antiestrogen therapy ------------------------------------------------------------------------------------------------------------------------------------- Current treatment: Kadcyla maintenance started 01/31/2019 Kadcyla toxicities:No major side effects to Kadcyla. 1. Chemotherapy-induced anemia/iron deficiency anemia: Today's hemoglobin is Currently receiving IV iron with each infusion of Kadcyla.  Echocardiogram:02/11/2019,EF 60 to 65%, patient will need a new echocardiogram.  Hypokalemia:On potassium twice daily. Hypomagnesemia: On magnesium replacement.    Return to clinic in 3 weeks for next dose.

## 2019-06-13 NOTE — Telephone Encounter (Signed)
R/s appt per 1/8 sch message - unable to reach pt . Left message with new appt date and time

## 2019-06-17 DIAGNOSIS — C50912 Malignant neoplasm of unspecified site of left female breast: Secondary | ICD-10-CM | POA: Diagnosis not present

## 2019-06-17 DIAGNOSIS — Z483 Aftercare following surgery for neoplasm: Secondary | ICD-10-CM | POA: Diagnosis not present

## 2019-06-17 DIAGNOSIS — I890 Lymphedema, not elsewhere classified: Secondary | ICD-10-CM | POA: Diagnosis not present

## 2019-06-17 DIAGNOSIS — E1122 Type 2 diabetes mellitus with diabetic chronic kidney disease: Secondary | ICD-10-CM | POA: Diagnosis not present

## 2019-06-17 DIAGNOSIS — I5033 Acute on chronic diastolic (congestive) heart failure: Secondary | ICD-10-CM | POA: Diagnosis not present

## 2019-06-17 DIAGNOSIS — I130 Hypertensive heart and chronic kidney disease with heart failure and stage 1 through stage 4 chronic kidney disease, or unspecified chronic kidney disease: Secondary | ICD-10-CM | POA: Diagnosis not present

## 2019-06-26 ENCOUNTER — Ambulatory Visit: Payer: Medicare Other

## 2019-06-26 ENCOUNTER — Ambulatory Visit: Payer: Medicare Other | Admitting: Hematology and Oncology

## 2019-06-26 ENCOUNTER — Other Ambulatory Visit: Payer: Medicare Other

## 2019-06-27 ENCOUNTER — Other Ambulatory Visit: Payer: Medicare Other

## 2019-06-27 ENCOUNTER — Ambulatory Visit: Payer: Medicare Other

## 2019-06-27 ENCOUNTER — Ambulatory Visit: Payer: Medicare Other | Admitting: Hematology and Oncology

## 2019-07-03 NOTE — Progress Notes (Signed)
Patient Care Team: [name], MD as PCP - General (Family Medicine) Harl Bowie, Alphonse Guild, MD as PCP - Cardiology (Cardiology) Mauro Kaufmann, RN as Oncology Nurse Navigator Rockwell Germany, RN as Oncology Nurse Navigator  DIAGNOSIS:    ICD-10-CM   1. Malignant neoplasm of upper-outer quadrant of left breast in female, estrogen receptor positive ([place])  C50.412    Z17.0     SUMMARY OF ONCOLOGIC HISTORY: Oncology History  Malignant neoplasm of upper-outer quadrant of left breast in female, estrogen receptor positive ([place])  07/09/2018 Initial Diagnosis   Screening mammogram detected spiculated mass in the left breast upper outer quadrant, 2.6 cm by MRI, axilla negative, biopsy revealed grade 2 IDC with DCIS, ER 100%, PR 70%, Ki-67 10%, HER-2 3+ positive, T2N0 stage Ib clinical stage   07/25/2018 Cancer Staging   Staging form: Breast, AJCC 8th Edition - Clinical stage from 07/25/2018: Stage IB (cT2, cN0, cM0, G2, ER+, PR+, HER2+) - Signed by Nicholas Lose, MD on 07/25/2018   08/02/2018 -  Neo-Adjuvant Chemotherapy   Neoadjuvant chemotherapy with Taxol weekly with Herceptin and Perjeta every 3 weeks   10/13/2018 Genetic Testing   Negative genetic testing on the multicancer panel.  The Multi-Gene Panel offered by [facility] includes sequencing and/or deletion duplication testing of the following 85 genes: AIP, ALK, APC, ATM, AXIN2,BAP1,  BARD1, BLM, BMPR1A, BRCA1, BRCA2, BRIP1, CASR, CDC73, CDH1, CDK4, CDKN1B, CDKN1C, CDKN2A (p14ARF), CDKN2A (p16INK4a), CEBPA, CHEK2, CTNNA1, DICER1, DIS3L2, EGFR (c.2369C>T, p.Thr790Met variant only), EPCAM (Deletion/duplication testing only), FH, FLCN, GATA2, GPC3, GREM1 (Promoter region deletion/duplication testing only), HOXB13 (c.251G>A, p.Gly84Glu), HRAS, KIT, MAX, MEN1, MET, MITF (c.952G>A, p.Glu318Lys variant only), MLH1, MSH2, MSH3, MSH6, MUTYH, NBN, NF1, NF2, NTHL1, PALB2, PDGFRA, PHOX2B, PMS2, POLD1, POLE, POT1, PRKAR1A, PTCH1, PTEN, RAD50, RAD51C,  RAD51D, RB1, RECQL4, RET, RNF43, RUNX1, SDHAF2, SDHA (sequence changes only), SDHB, SDHC, SDHD, SMAD4, SMARCA4, SMARCB1, SMARCE1, STK11, SUFU, TERC, TERT, TMEM127, TP53, TSC1, TSC2, VHL, WRN and WT1.  The report date is Oct 13, 2018.   12/10/2018 Surgery   Left lumpectomy ([facility]): IDC, 1.5cm, grade 2, HER-2 negative, ER+ 100%, PR+ 80%, Ki67 5%, clear margins, 2 lymph nodes negative for carcinoma.    12/10/2018 Cancer Staging   Staging form: Breast, AJCC 8th Edition - Pathologic stage from 12/10/2018: No Stage Recommended (ypT1c, pN0, cM0, G2, ER+, PR+, HER2-) - Signed by Gardenia Phlegm, NP on 12/18/2018   01/21/2019 - 02/18/2019 Radiation Therapy   Adjuvant radiation   01/31/2019 -  Chemotherapy   The patient had ado-trastuzumab emtansine (KADCYLA) 460 mg in sodium chloride 0.9 % 250 mL chemo infusion, 3.6 mg/kg = 460 mg, Intravenous, Once, 7 of 11 cycles Dose modification: 3 mg/kg (original dose 3.6 mg/kg, Cycle 2, Reason: Dose not tolerated) Administration: 460 mg (01/31/2019), 360 mg (02/21/2019), 360 mg (04/25/2019), 360 mg (06/13/2019), 360 mg (05/16/2019), 360 mg (03/14/2019), 360 mg (04/04/2019)  for chemotherapy treatment.      CHIEF COMPLIANT: Kadcyla maintenance  INTERVAL HISTORY: Destiny Nash is a 73 y.o. with above-mentioned history of left breast cancer who underwent neoadjuvant chemotherapy,a left lumpectomy, and completed radiation. She is currently on Kadcyla maintenance.She presents to the clinic todayfor treatment. She has Rt LE cellulitis and is taking Doxycycline. Her BP is slightly low today, some numbness intermittantly  ALLERGIES:  is allergic to levaquin [levofloxacin] and codeine.  MEDICATIONS:  Current Outpatient Medications  Medication Sig Dispense Refill   acetaminophen (TYLENOL) 500 MG tablet Take 1,000 mg by mouth every 6 (six)  hours as needed for moderate pain or headache.     albuterol (PROVENTIL HFA;VENTOLIN HFA) 108 (90 Base) MCG/ACT inhaler  Inhale 2 puffs into the lungs every 6 (six) hours as needed for wheezing or shortness of breath.     apixaban (ELIQUIS) 5 MG TABS tablet Take 1 tablet (5 mg total) by mouth 2 (two) times daily. 60 tablet 0   cholestyramine (QUESTRAN) 4 g packet Take 1 packet (4 g total) by mouth 3 (three) times daily with meals. (Patient taking differently: Take 4 g by mouth daily as needed. ) 60 each 12   CINNAMON PO Take 1 tablet by mouth daily.     diltiazem (CARDIZEM CD) 300 MG 24 hr capsule Take 1 capsule (300 mg total) by mouth daily. 90 capsule 3   diphenoxylate-atropine (LOMOTIL) 2.5-0.025 MG tablet Take 1 tablet by mouth 4 (four) times daily as needed for diarrhea or loose stools. 120 tablet 3   furosemide (LASIX) 40 MG tablet Take 1.5 tablets (60 mg total) by mouth daily. 135 tablet 3   insulin detemir (LEVEMIR) 100 UNIT/ML injection Inject 0.11 mLs (11 Units total) into the skin at bedtime. (Patient taking differently: Inject 8 Units into the skin at bedtime. )  0   LORazepam (ATIVAN) 1 MG tablet Take 1 tablet 30 min before radiotherapy, PRN anxiety and claustrophobia. 30 tablet 3   Magnesium Oxide 400 MG CAPS Take 1 capsule (400 mg total) by mouth daily. 90 capsule 3   metFORMIN (GLUCOPHAGE) 500 MG tablet Take 1,000 mg by mouth 2 (two) times daily with a meal.      niacin 500 MG tablet Take 500 mg by mouth daily.     NYSTATIN powder Apply 1 Bottle topically daily as needed (irritation).      Omega-3 1000 MG CAPS Take 1,000 mg by mouth 2 (two) times daily.     ondansetron (ZOFRAN) 8 MG tablet Take 1 tablet (8 mg total) by mouth 2 (two) times daily as needed (Nausea or vomiting). 30 tablet 1   pantoprazole (PROTONIX) 40 MG tablet Take 1 tablet by mouth once daily 90 tablet 0   potassium chloride SA (KLOR-CON) 20 MEQ tablet Take 1 tablet (20 mEq total) by mouth 2 (two) times daily. 60 tablet 2   pravastatin (PRAVACHOL) 20 MG tablet Take 20 mg by mouth daily before breakfast.   0    prochlorperazine (COMPAZINE) 10 MG tablet Take 1 tablet (10 mg total) by mouth every 6 (six) hours as needed (Nausea or vomiting). 30 tablet 1   traMADol (ULTRAM) 50 MG tablet Take 1 tablet (50 mg total) by mouth every 6 (six) hours as needed. 20 tablet 0   No current facility-administered medications for this visit.    PHYSICAL EXAMINATION: ECOG PERFORMANCE STATUS: 1 - Symptomatic but completely ambulatory  Vitals:   07/04/19 1110  BP: (!) 100/50  Pulse: 92  Resp: 18  Temp: 98 F (36.7 C)  SpO2: 100%   Filed Weights    LABORATORY DATA:  I have reviewed the data as listed CMP Latest Ref Rng & Units 06/13/2019 05/16/2019 04/25/2019  Glucose 70 - 99 mg/dL 102(H) 223(H) 186(H)  BUN 8 - 23 mg/dL 31(H) 20 19  Creatinine 0.44 - 1.00 mg/dL 0.85 0.82 0.86  Sodium 135 - 145 mmol/L 137 137 136  Potassium 3.5 - 5.1 mmol/L 5.1 4.4 4.6  Chloride 98 - 111 mmol/L 104 102 103  CO2 22 - 32 mmol/L _0 Calcium 8.9 -  mg/dL 8.6(L) 8.3(L) 8.7(L)  °Total Protein 6.5 - 8.1 g/dL 6.5 6.9 6.9  °Total Bilirubin 0.3 - 1.2 mg/dL 0.7 0.7 0.6  °Alkaline Phos 38 - 126 U/L 156(H) 159(H) 140(H)  °AST 15 - 41 U/L 21 24 23  °ALT 0 - 44 U/L 13 11 12  ° ° °Lab Results  °Component Value Date  ° WBC 9.8 07/04/2019  ° HGB 8.6 (L) 07/04/2019  ° HCT 28.3 (L) 07/04/2019  ° MCV 71.5 (L) 07/04/2019  ° PLT 136 (L) 07/04/2019  ° NEUTROABS 7.7 07/04/2019  ° ° °ASSESSMENT & PLAN:  °Malignant neoplasm of upper-outer quadrant of left breast in female, estrogen receptor positive ([facility]) °07/09/2018: Screening mammogram detected spiculated mass in the left breast upper outer quadrant, 2.6 cm by MRI, axilla negative, biopsy revealed grade 2 IDC with DCIS, ER 100%, PR 70%, Ki-67 10%, HER-2 3+ positive, T2N0 stage Ib clinical stage °  °Treatment plan: °1. Neoadjuvant chemotherapy with Taxol weekly x 8 Herceptin Perjeta (Perjeta discontinued due to diarrhea )every 3 weeks followed by  Kadcyla maintenance for 1 year °2. 12/10/2018:Left  lumpectomy ([facility]): IDC, 1.5cm, grade 2, HER-2 negative, ER+ 100%, PR+ 80%, Ki67 5%, clear margins, 2 lymph nodes negative for carcinoma.  °3. Followed by adjuvant radiation therapy started 01/21/2019 °4.  Followed by adjuvant antiestrogen therapy °------------------------------------------------------------------------------------------------------------------------------------- °Current treatment: Kadcyla maintenance started 01/31/2019 °Kadcyla toxicities: No major side effects to Kadcyla. °1. Chemotherapy-induced anemia/iron deficiency anemia:  Receiving IV iron with each Kadcyla treatment  °2.  Chemotherapy-induced fatigue: Stable °3.    Intermittent diarrhea °4. Rt LE cellulitis: refilled Doxy °  °Mild neuropathy °Echocardiogram: 02/11/2019, EF 60 to 65%, patient will need a new echocardiogram. °  °Hypokalemia: Secondary to diarrhea, on potassium replacement °Hypomagnesemia:  On magnesium supplements. ° She has 3 more treatments left after today. ° Patient is very motivated to finish all of her treatments. °Return to clinic in 3 weeks for next dose. We will see if she can stop after the next treatment. ° ° ° °No orders of the defined types were placed in this encounter. ° °The patient has a good understanding of the overall plan. she agrees with it. she will call with any problems that may develop before the next visit here. ° °Total time spent: 30 mins including face to face time and time spent for planning, charting and coordination of care ° °, , MD °07/04/2019 ° °I, Molly Dorshimer, am acting as scribe for Dr.  . ° °I have reviewed the above documentation for accuracy and completeness, and I agree with the above. ° ° ° ° ° ° °

## 2019-07-04 ENCOUNTER — Inpatient Hospital Stay: Payer: Medicare Other

## 2019-07-04 ENCOUNTER — Other Ambulatory Visit: Payer: Self-pay

## 2019-07-04 ENCOUNTER — Inpatient Hospital Stay (HOSPITAL_BASED_OUTPATIENT_CLINIC_OR_DEPARTMENT_OTHER): Payer: Medicare Other | Admitting: Hematology and Oncology

## 2019-07-04 DIAGNOSIS — C50412 Malignant neoplasm of upper-outer quadrant of left female breast: Secondary | ICD-10-CM

## 2019-07-04 DIAGNOSIS — Z95828 Presence of other vascular implants and grafts: Secondary | ICD-10-CM

## 2019-07-04 DIAGNOSIS — D649 Anemia, unspecified: Secondary | ICD-10-CM

## 2019-07-04 DIAGNOSIS — E876 Hypokalemia: Secondary | ICD-10-CM | POA: Diagnosis not present

## 2019-07-04 DIAGNOSIS — Z170 Estrogen receptor positive status [ER+]: Secondary | ICD-10-CM

## 2019-07-04 DIAGNOSIS — Z5112 Encounter for antineoplastic immunotherapy: Secondary | ICD-10-CM | POA: Diagnosis not present

## 2019-07-04 DIAGNOSIS — R200 Anesthesia of skin: Secondary | ICD-10-CM | POA: Diagnosis not present

## 2019-07-04 LAB — CMP (CANCER CENTER ONLY)
ALT: 15 U/L (ref 0–44)
AST: 27 U/L (ref 15–41)
Albumin: 2.7 g/dL — ABNORMAL LOW (ref 3.5–5.0)
Alkaline Phosphatase: 196 U/L — ABNORMAL HIGH (ref 38–126)
Anion gap: 8 (ref 5–15)
BUN: 35 mg/dL — ABNORMAL HIGH (ref 8–23)
CO2: 25 mmol/L (ref 22–32)
Calcium: 8.1 mg/dL — ABNORMAL LOW (ref 8.9–10.3)
Chloride: 102 mmol/L (ref 98–111)
Creatinine: 1.05 mg/dL — ABNORMAL HIGH (ref 0.44–1.00)
GFR, Est AFR Am: >60 mL/min (ref >60)
GFR, Estimated: 53 mL/min — ABNORMAL LOW (ref >60)
Glucose, Bld: 127 mg/dL — ABNORMAL HIGH (ref 70–99)
Potassium: 4.7 mmol/L (ref 3.5–5.1)
Sodium: 135 mmol/L (ref 135–145)
Total Bilirubin: 1.3 mg/dL — ABNORMAL HIGH (ref 0.3–1.2)
Total Protein: 6.7 g/dL (ref 6.5–8.1)

## 2019-07-04 LAB — CBC WITH DIFFERENTIAL (CANCER CENTER ONLY)
Abs Immature Granulocytes: 0.03 10*3/uL (ref 0.00–0.07)
Basophils Absolute: 0 10*3/uL (ref 0.0–0.1)
Basophils Relative: 0 %
Eosinophils Absolute: 0.1 10*3/uL (ref 0.0–0.5)
Eosinophils Relative: 1 %
HCT: 28.3 % — ABNORMAL LOW (ref 36.0–46.0)
Hemoglobin: 8.6 g/dL — ABNORMAL LOW (ref 12.0–15.0)
Immature Granulocytes: 0 %
Lymphocytes Relative: 10 %
Lymphs Abs: 0.9 10*3/uL (ref 0.7–4.0)
MCH: 21.7 pg — ABNORMAL LOW (ref 26.0–34.0)
MCHC: 30.4 g/dL (ref 30.0–36.0)
MCV: 71.5 fL — ABNORMAL LOW (ref 80.0–100.0)
Monocytes Absolute: 1.1 10*3/uL — ABNORMAL HIGH (ref 0.1–1.0)
Monocytes Relative: 11 %
Neutro Abs: 7.7 10*3/uL (ref 1.7–7.7)
Neutrophils Relative %: 78 %
Platelet Count: 136 10*3/uL — ABNORMAL LOW (ref 150–400)
RBC: 3.96 MIL/uL (ref 3.87–5.11)
RDW: 24.4 % — ABNORMAL HIGH (ref 11.5–15.5)
WBC Count: 9.8 10*3/uL (ref 4.0–10.5)
nRBC: 0 % (ref 0.0–0.2)

## 2019-07-04 MED ORDER — ACETAMINOPHEN 325 MG PO TABS
650.0000 mg | ORAL_TABLET | Freq: Once | ORAL | Status: AC
  Administered 2019-07-04: 650 mg via ORAL

## 2019-07-04 MED ORDER — DOXYCYCLINE HYCLATE 100 MG PO TABS: 100.0000 mg | ORAL_TABLET | Freq: Two times a day (BID) | ORAL | 3 refills | Status: DC

## 2019-07-04 MED ORDER — HEPARIN SOD (PORK) LOCK FLUSH 100 UNIT/ML IV SOLN
500.0000 [IU] | Freq: Once | INTRAVENOUS | Status: AC | PRN
  Administered 2019-07-04: 500 [IU]
  Filled 2019-07-04: qty 5

## 2019-07-04 MED ORDER — ACETAMINOPHEN 325 MG PO TABS
ORAL_TABLET | ORAL | Status: AC
  Filled 2019-07-04: qty 2

## 2019-07-04 MED ORDER — DIPHENHYDRAMINE HCL 25 MG PO CAPS
ORAL_CAPSULE | ORAL | Status: AC
  Filled 2019-07-04: qty 2

## 2019-07-04 MED ORDER — SODIUM CHLORIDE 0.9 % IV SOLN: 200.0000 mg | Freq: Once | INTRAVENOUS | Status: DC

## 2019-07-04 MED ORDER — SODIUM CHLORIDE 0.9% FLUSH
10.0000 mL | INTRAVENOUS | Status: DC | PRN
  Administered 2019-07-04: 10 mL
  Filled 2019-07-04: qty 10

## 2019-07-04 MED ORDER — SODIUM CHLORIDE 0.9 % IV SOLN
200.0000 mg | Freq: Once | INTRAVENOUS | Status: AC
  Administered 2019-07-04: 200 mg via INTRAVENOUS
  Filled 2019-07-04: qty 200

## 2019-07-04 MED ORDER — SODIUM CHLORIDE 0.9 % IV SOLN
Freq: Once | INTRAVENOUS | Status: AC
  Filled 2019-07-04: qty 250

## 2019-07-04 MED ORDER — SODIUM CHLORIDE 0.9 % IV SOLN
Freq: Once | INTRAVENOUS | Status: DC
  Filled 2019-07-04: qty 250

## 2019-07-04 MED ORDER — DIPHENHYDRAMINE HCL 25 MG PO CAPS
50.0000 mg | ORAL_CAPSULE | Freq: Once | ORAL | Status: AC
  Administered 2019-07-04: 50 mg via ORAL

## 2019-07-04 MED ORDER — SODIUM CHLORIDE 0.9 % IV SOLN
2.8000 mg/kg | Freq: Once | INTRAVENOUS | Status: AC
  Administered 2019-07-04: 360 mg via INTRAVENOUS
  Filled 2019-07-04: qty 10

## 2019-07-04 MED ORDER — SODIUM CHLORIDE 0.9% FLUSH
10.0000 mL | Freq: Once | INTRAVENOUS | Status: AC
  Administered 2019-07-04: 10 mL
  Filled 2019-07-04: qty 10

## 2019-07-04 NOTE — Patient Instructions (Signed)
[facility] Discharge Instructions for Patients Receiving Chemotherapy  Today you received the following chemotherapy agents: Kadcyla  To help prevent nausea and vomiting after your treatment, we encourage you to take your nausea medication as directed.   If you develop nausea and vomiting that is not controlled by your nausea medication, call the clinic.   BELOW ARE SYMPTOMS THAT SHOULD BE REPORTED IMMEDIATELY:  *FEVER GREATER THAN 100.5 F  *CHILLS WITH OR WITHOUT FEVER  NAUSEA AND VOMITING THAT IS NOT CONTROLLED WITH YOUR NAUSEA MEDICATION  *UNUSUAL SHORTNESS OF BREATH  *UNUSUAL BRUISING OR BLEEDING  TENDERNESS IN MOUTH AND THROAT WITH OR WITHOUT PRESENCE OF ULCERS  *URINARY PROBLEMS  *BOWEL PROBLEMS  UNUSUAL RASH Items with * indicate a potential emergency and should be followed up as soon as possible.  Feel free to call the clinic should you have any questions or concerns. The clinic phone number is [phone].  Please show the [place] [facility] at check-in to the Emergency Department and triage nurse.  Iron Sucrose injection What is this medicine? IRON SUCROSE (AHY ern SOO krohs) is an iron complex. Iron is used to make healthy red blood cells, which carry oxygen and nutrients throughout the body. This medicine is used to treat iron deficiency anemia in people with chronic kidney disease. This medicine may be used for other purposes; ask your health care provider or pharmacist if you have questions. COMMON BRAND NAME(S): Venofer What should I tell my health care provider before I take this medicine? They need to know if you have any of these conditions:  anemia not caused by low iron levels  heart disease  high levels of iron in the blood  kidney disease  liver disease  an unusual or allergic reaction to iron, other medicines, foods, dyes, or preservatives  pregnant or trying to get pregnant  breast-feeding How should I use this  medicine? This medicine is for infusion into a vein. It is given by a health care professional in a hospital or clinic setting. Talk to your pediatrician regarding the use of this medicine in children. While this drug may be prescribed for children as young as 2 years for selected conditions, precautions do apply. Overdosage: If you think you have taken too much of this medicine contact a poison control center or emergency room at once. NOTE: This medicine is only for you. Do not share this medicine with others. What if I miss a dose? It is important not to miss your dose. Call your doctor or health care professional if you are unable to keep an appointment. What may interact with this medicine? Do not take this medicine with any of the following medications:  deferoxamine  dimercaprol  other iron products This medicine may also interact with the following medications:  chloramphenicol  deferasirox This list may not describe all possible interactions. Give your health care provider a list of all the medicines, herbs, non-prescription drugs, or dietary supplements you use. Also tell them if you smoke, drink alcohol, or use illegal drugs. Some items may interact with your medicine. What should I watch for while using this medicine? Visit your doctor or healthcare professional regularly. Tell your doctor or healthcare professional if your symptoms do not start to get better or if they get worse. You may need blood work done while you are taking this medicine. You may need to follow a special diet. Talk to your doctor. Foods that contain iron include: whole grains/cereals, dried fruits, beans,  or peas, leafy green vegetables, and organ meats (liver, kidney). What side effects may I notice from receiving this medicine? Side effects that you should report to your doctor or health care professional as soon as possible:  allergic reactions like skin rash, itching or hives, swelling of the face,  lips, or tongue  breathing problems  changes in blood pressure  cough  fast, irregular heartbeat  feeling faint or lightheaded, falls  fever or chills  flushing, sweating, or hot feelings  joint or muscle aches/pains  seizures  swelling of the ankles or feet  unusually weak or tired Side effects that usually do not require medical attention (report to your doctor or health care professional if they continue or are bothersome):  diarrhea  feeling achy  headache  irritation at site where injected  nausea, vomiting  stomach upset  tiredness This list may not describe all possible side effects. Call your doctor for medical advice about side effects. You may report side effects to FDA at 1-800-FDA-1088. Where should I keep my medicine? This drug is given in a hospital or clinic and will not be stored at home. NOTE: This sheet is a summary. It may not cover all possible information. If you have questions about this medicine, talk to your doctor, pharmacist, or health care provider.  2020 [employer]vier/Gold Standard (2011-03-02 17:14:35)

## 2019-07-04 NOTE — Assessment & Plan Note (Signed)
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 Neoadjuvant chemotherapy withTaxol weekly x8Herceptin Perjeta (Perjeta discontinued due to diarrhea)every 3 weeksfollowed by Kadcylamaintenance for 1 year 2. 12/10/2018:Left lumpectomy ([facility]): IDC, 1.5cm, grade 2, HER-2 negative, ER+ 100%, PR+ 80%, Ki67 5%, clear margins, 2 lymph nodes negative for carcinoma. 3. Followed by adjuvant radiation therapystarted 01/21/2019 4.Followed by adjuvant antiestrogen therapy ------------------------------------------------------------------------------------------------------------------------------------- Current treatment: Kadcyla maintenance started 01/31/2019 Kadcyla toxicities:No major side effects to Kadcyla. 1. Chemotherapy-induced anemia/iron deficiency anemia:  Receiving IV iron with each Kadcyla treatment  2.  Chemotherapy-induced fatigue: Stable 3.    Intermittent diarrhea  Denies neuropathy Echocardiogram:02/11/2019,EF 60 to 65%, patient will need a new echocardiogram.  Hypokalemia:Secondary to diarrhea, on potassium replacement Hypomagnesemia:  On magnesium supplements. She has 3 more treatments left after today.  Return to clinic in 3 weeks for next dose.  

## 2019-07-07 ENCOUNTER — Encounter: Payer: Self-pay | Admitting: Hematology and Oncology

## 2019-07-07 ENCOUNTER — Telehealth: Payer: Self-pay | Admitting: Hematology and Oncology

## 2019-07-07 ENCOUNTER — Other Ambulatory Visit: Payer: Self-pay | Admitting: Hematology and Oncology

## 2019-07-07 DIAGNOSIS — R531 Weakness: Secondary | ICD-10-CM | POA: Diagnosis not present

## 2019-07-07 DIAGNOSIS — C50912 Malignant neoplasm of unspecified site of left female breast: Secondary | ICD-10-CM | POA: Diagnosis not present

## 2019-07-07 NOTE — Telephone Encounter (Signed)
Lomotil Refill

## 2019-07-07 NOTE — Telephone Encounter (Signed)
I left a message regarding schedule  

## 2019-07-08 ENCOUNTER — Encounter (HOSPITAL_COMMUNITY): Payer: Self-pay | Admitting: Emergency Medicine

## 2019-07-08 ENCOUNTER — Emergency Department (HOSPITAL_COMMUNITY): Payer: Medicare Other

## 2019-07-08 ENCOUNTER — Inpatient Hospital Stay (HOSPITAL_COMMUNITY): Payer: Medicare Other

## 2019-07-08 ENCOUNTER — Inpatient Hospital Stay (HOSPITAL_COMMUNITY)
Admission: EM | Admit: 2019-07-08 | Discharge: 2019-07-14 | DRG: 871 | Disposition: A | Discharge status: Skilled Nursing Facility | Payer: Medicare Other | Attending: Family Medicine | Admitting: Family Medicine

## 2019-07-08 ENCOUNTER — Other Ambulatory Visit: Payer: Self-pay

## 2019-07-08 DIAGNOSIS — R531 Weakness: Secondary | ICD-10-CM

## 2019-07-08 DIAGNOSIS — I10 Essential (primary) hypertension: Secondary | ICD-10-CM | POA: Diagnosis present

## 2019-07-08 DIAGNOSIS — I5032 Chronic diastolic (congestive) heart failure: Secondary | ICD-10-CM | POA: Diagnosis not present

## 2019-07-08 DIAGNOSIS — N183 Chronic kidney disease, stage 3 unspecified: Secondary | ICD-10-CM | POA: Diagnosis present

## 2019-07-08 DIAGNOSIS — Z794 Long term (current) use of insulin: Secondary | ICD-10-CM

## 2019-07-08 DIAGNOSIS — F411 Generalized anxiety disorder: Secondary | ICD-10-CM | POA: Diagnosis not present

## 2019-07-08 DIAGNOSIS — K8000 Calculus of gallbladder with acute cholecystitis without obstruction: Secondary | ICD-10-CM | POA: Diagnosis not present

## 2019-07-08 DIAGNOSIS — Z20822 Contact with and (suspected) exposure to covid-19: Secondary | ICD-10-CM | POA: Diagnosis present

## 2019-07-08 DIAGNOSIS — R0989 Other specified symptoms and signs involving the circulatory and respiratory systems: Secondary | ICD-10-CM | POA: Diagnosis not present

## 2019-07-08 DIAGNOSIS — R932 Abnormal findings on diagnostic imaging of liver and biliary tract: Secondary | ICD-10-CM

## 2019-07-08 DIAGNOSIS — Z741 Need for assistance with personal care: Secondary | ICD-10-CM | POA: Diagnosis not present

## 2019-07-08 DIAGNOSIS — C50912 Malignant neoplasm of unspecified site of left female breast: Secondary | ICD-10-CM | POA: Diagnosis not present

## 2019-07-08 DIAGNOSIS — E1149 Type 2 diabetes mellitus with other diabetic neurological complication: Secondary | ICD-10-CM | POA: Diagnosis present

## 2019-07-08 DIAGNOSIS — Z8349 Family history of other endocrine, nutritional and metabolic diseases: Secondary | ICD-10-CM

## 2019-07-08 DIAGNOSIS — E1165 Type 2 diabetes mellitus with hyperglycemia: Secondary | ICD-10-CM | POA: Diagnosis present

## 2019-07-08 DIAGNOSIS — R748 Abnormal levels of other serum enzymes: Secondary | ICD-10-CM

## 2019-07-08 DIAGNOSIS — N179 Acute kidney failure, unspecified: Secondary | ICD-10-CM | POA: Diagnosis present

## 2019-07-08 DIAGNOSIS — R471 Dysarthria and anarthria: Secondary | ICD-10-CM | POA: Diagnosis not present

## 2019-07-08 DIAGNOSIS — A419 Sepsis, unspecified organism: Secondary | ICD-10-CM | POA: Diagnosis not present

## 2019-07-08 DIAGNOSIS — M7989 Other specified soft tissue disorders: Secondary | ICD-10-CM

## 2019-07-08 DIAGNOSIS — R1111 Vomiting without nausea: Secondary | ICD-10-CM | POA: Diagnosis not present

## 2019-07-08 DIAGNOSIS — K810 Acute cholecystitis: Secondary | ICD-10-CM | POA: Diagnosis present

## 2019-07-08 DIAGNOSIS — E1122 Type 2 diabetes mellitus with diabetic chronic kidney disease: Secondary | ICD-10-CM | POA: Diagnosis present

## 2019-07-08 DIAGNOSIS — I4891 Unspecified atrial fibrillation: Secondary | ICD-10-CM | POA: Diagnosis present

## 2019-07-08 DIAGNOSIS — R1110 Vomiting, unspecified: Secondary | ICD-10-CM | POA: Diagnosis not present

## 2019-07-08 DIAGNOSIS — D849 Immunodeficiency, unspecified: Secondary | ICD-10-CM | POA: Diagnosis present

## 2019-07-08 DIAGNOSIS — E872 Acidosis: Secondary | ICD-10-CM | POA: Diagnosis present

## 2019-07-08 DIAGNOSIS — R0902 Hypoxemia: Secondary | ICD-10-CM | POA: Diagnosis not present

## 2019-07-08 DIAGNOSIS — K5900 Constipation, unspecified: Secondary | ICD-10-CM | POA: Diagnosis present

## 2019-07-08 DIAGNOSIS — I482 Chronic atrial fibrillation, unspecified: Secondary | ICD-10-CM | POA: Diagnosis not present

## 2019-07-08 DIAGNOSIS — C50412 Malignant neoplasm of upper-outer quadrant of left female breast: Secondary | ICD-10-CM

## 2019-07-08 DIAGNOSIS — R6521 Severe sepsis with septic shock: Secondary | ICD-10-CM | POA: Diagnosis not present

## 2019-07-08 DIAGNOSIS — E785 Hyperlipidemia, unspecified: Secondary | ICD-10-CM | POA: Diagnosis present

## 2019-07-08 DIAGNOSIS — Z86711 Personal history of pulmonary embolism: Secondary | ICD-10-CM

## 2019-07-08 DIAGNOSIS — I739 Peripheral vascular disease, unspecified: Secondary | ICD-10-CM | POA: Diagnosis not present

## 2019-07-08 DIAGNOSIS — R112 Nausea with vomiting, unspecified: Secondary | ICD-10-CM | POA: Diagnosis not present

## 2019-07-08 DIAGNOSIS — Z853 Personal history of malignant neoplasm of breast: Secondary | ICD-10-CM

## 2019-07-08 DIAGNOSIS — K529 Noninfective gastroenteritis and colitis, unspecified: Secondary | ICD-10-CM | POA: Diagnosis not present

## 2019-07-08 DIAGNOSIS — M6281 Muscle weakness (generalized): Secondary | ICD-10-CM | POA: Diagnosis not present

## 2019-07-08 DIAGNOSIS — Z79899 Other long term (current) drug therapy: Secondary | ICD-10-CM

## 2019-07-08 DIAGNOSIS — R188 Other ascites: Secondary | ICD-10-CM

## 2019-07-08 DIAGNOSIS — Z9181 History of falling: Secondary | ICD-10-CM | POA: Diagnosis not present

## 2019-07-08 DIAGNOSIS — Z7951 Long term (current) use of inhaled steroids: Secondary | ICD-10-CM

## 2019-07-08 DIAGNOSIS — C50919 Malignant neoplasm of unspecified site of unspecified female breast: Secondary | ICD-10-CM | POA: Diagnosis present

## 2019-07-08 DIAGNOSIS — K7469 Other cirrhosis of liver: Secondary | ICD-10-CM | POA: Diagnosis present

## 2019-07-08 DIAGNOSIS — E119 Type 2 diabetes mellitus without complications: Secondary | ICD-10-CM

## 2019-07-08 DIAGNOSIS — Z881 Allergy status to other antibiotic agents status: Secondary | ICD-10-CM

## 2019-07-08 DIAGNOSIS — Z7901 Long term (current) use of anticoagulants: Secondary | ICD-10-CM | POA: Diagnosis not present

## 2019-07-08 DIAGNOSIS — E876 Hypokalemia: Secondary | ICD-10-CM | POA: Diagnosis present

## 2019-07-08 DIAGNOSIS — W19XXXA Unspecified fall, initial encounter: Secondary | ICD-10-CM | POA: Diagnosis present

## 2019-07-08 DIAGNOSIS — R600 Localized edema: Secondary | ICD-10-CM | POA: Diagnosis not present

## 2019-07-08 DIAGNOSIS — Z888 Allergy status to other drugs, medicaments and biological substances status: Secondary | ICD-10-CM

## 2019-07-08 DIAGNOSIS — E871 Hypo-osmolality and hyponatremia: Secondary | ICD-10-CM | POA: Diagnosis present

## 2019-07-08 DIAGNOSIS — K7460 Unspecified cirrhosis of liver: Secondary | ICD-10-CM | POA: Diagnosis not present

## 2019-07-08 DIAGNOSIS — J90 Pleural effusion, not elsewhere classified: Secondary | ICD-10-CM | POA: Diagnosis not present

## 2019-07-08 DIAGNOSIS — Z515 Encounter for palliative care: Secondary | ICD-10-CM | POA: Diagnosis present

## 2019-07-08 DIAGNOSIS — Z6841 Body Mass Index (BMI) 40.0 and over, adult: Secondary | ICD-10-CM

## 2019-07-08 DIAGNOSIS — K8020 Calculus of gallbladder without cholecystitis without obstruction: Secondary | ICD-10-CM | POA: Diagnosis not present

## 2019-07-08 DIAGNOSIS — R918 Other nonspecific abnormal finding of lung field: Secondary | ICD-10-CM | POA: Diagnosis not present

## 2019-07-08 DIAGNOSIS — I5033 Acute on chronic diastolic (congestive) heart failure: Secondary | ICD-10-CM | POA: Diagnosis present

## 2019-07-08 DIAGNOSIS — E875 Hyperkalemia: Secondary | ICD-10-CM | POA: Diagnosis not present

## 2019-07-08 DIAGNOSIS — I5030 Unspecified diastolic (congestive) heart failure: Secondary | ICD-10-CM | POA: Diagnosis present

## 2019-07-08 DIAGNOSIS — R0600 Dyspnea, unspecified: Secondary | ICD-10-CM

## 2019-07-08 DIAGNOSIS — C3490 Malignant neoplasm of unspecified part of unspecified bronchus or lung: Secondary | ICD-10-CM | POA: Diagnosis present

## 2019-07-08 DIAGNOSIS — I5031 Acute diastolic (congestive) heart failure: Secondary | ICD-10-CM | POA: Diagnosis not present

## 2019-07-08 DIAGNOSIS — I130 Hypertensive heart and chronic kidney disease with heart failure and stage 1 through stage 4 chronic kidney disease, or unspecified chronic kidney disease: Secondary | ICD-10-CM | POA: Diagnosis present

## 2019-07-08 DIAGNOSIS — K7581 Nonalcoholic steatohepatitis (NASH): Secondary | ICD-10-CM | POA: Diagnosis present

## 2019-07-08 DIAGNOSIS — Z9981 Dependence on supplemental oxygen: Secondary | ICD-10-CM | POA: Diagnosis not present

## 2019-07-08 DIAGNOSIS — E11649 Type 2 diabetes mellitus with hypoglycemia without coma: Secondary | ICD-10-CM | POA: Diagnosis not present

## 2019-07-08 DIAGNOSIS — L8990 Pressure ulcer of unspecified site, unspecified stage: Secondary | ICD-10-CM | POA: Diagnosis present

## 2019-07-08 DIAGNOSIS — L0390 Cellulitis, unspecified: Secondary | ICD-10-CM | POA: Diagnosis present

## 2019-07-08 DIAGNOSIS — Z8049 Family history of malignant neoplasm of other genital organs: Secondary | ICD-10-CM

## 2019-07-08 DIAGNOSIS — D72829 Elevated white blood cell count, unspecified: Secondary | ICD-10-CM | POA: Diagnosis not present

## 2019-07-08 DIAGNOSIS — N189 Chronic kidney disease, unspecified: Secondary | ICD-10-CM | POA: Diagnosis not present

## 2019-07-08 DIAGNOSIS — K8010 Calculus of gallbladder with chronic cholecystitis without obstruction: Secondary | ICD-10-CM | POA: Diagnosis not present

## 2019-07-08 DIAGNOSIS — E669 Obesity, unspecified: Secondary | ICD-10-CM | POA: Diagnosis present

## 2019-07-08 DIAGNOSIS — Z885 Allergy status to narcotic agent status: Secondary | ICD-10-CM

## 2019-07-08 DIAGNOSIS — Z8249 Family history of ischemic heart disease and other diseases of the circulatory system: Secondary | ICD-10-CM

## 2019-07-08 DIAGNOSIS — R262 Difficulty in walking, not elsewhere classified: Secondary | ICD-10-CM | POA: Diagnosis not present

## 2019-07-08 LAB — COMPREHENSIVE METABOLIC PANEL
ALT: 23 U/L (ref 0–44)
AST: 51 U/L — ABNORMAL HIGH (ref 15–41)
Albumin: 2.7 g/dL — ABNORMAL LOW (ref 3.5–5.0)
Alkaline Phosphatase: 218 U/L — ABNORMAL HIGH (ref 38–126)
Anion gap: 11 (ref 5–15)
BUN: 47 mg/dL — ABNORMAL HIGH (ref 8–23)
CO2: 20 mmol/L — ABNORMAL LOW (ref 22–32)
Calcium: 8.5 mg/dL — ABNORMAL LOW (ref 8.9–10.3)
Chloride: 99 mmol/L (ref 98–111)
Creatinine, Ser: 1.42 mg/dL — ABNORMAL HIGH (ref 0.44–1.00)
GFR calc Af Amer: 43 mL/min — ABNORMAL LOW (ref >60)
GFR calc non Af Amer: 37 mL/min — ABNORMAL LOW (ref >60)
Glucose, Bld: 171 mg/dL — ABNORMAL HIGH (ref 70–99)
Potassium: 6.2 mmol/L — ABNORMAL HIGH (ref 3.5–5.1)
Sodium: 130 mmol/L — ABNORMAL LOW (ref 135–145)
Total Bilirubin: 1.8 mg/dL — ABNORMAL HIGH (ref 0.3–1.2)
Total Protein: 6.6 g/dL (ref 6.5–8.1)

## 2019-07-08 LAB — CBC
HCT: 28.7 % — ABNORMAL LOW (ref 36.0–46.0)
Hemoglobin: 8.6 g/dL — ABNORMAL LOW (ref 12.0–15.0)
MCH: 22.4 pg — ABNORMAL LOW (ref 26.0–34.0)
MCHC: 30 g/dL (ref 30.0–36.0)
MCV: 74.7 fL — ABNORMAL LOW (ref 80.0–100.0)
Platelets: 136 10*3/uL — ABNORMAL LOW (ref 150–400)
RBC: 3.84 MIL/uL — ABNORMAL LOW (ref 3.87–5.11)
RDW: 25.9 % — ABNORMAL HIGH (ref 11.5–15.5)
WBC: 14.6 10*3/uL — ABNORMAL HIGH (ref 4.0–10.5)
nRBC: 0 % (ref 0.0–0.2)

## 2019-07-08 LAB — BRAIN NATRIURETIC PEPTIDE: B Natriuretic Peptide: 78 pg/mL (ref 0.0–100.0)

## 2019-07-08 LAB — RESPIRATORY PANEL BY RT PCR (FLU A&B, COVID)
Influenza A by PCR: NEGATIVE
Influenza B by PCR: NEGATIVE
SARS Coronavirus 2 by RT PCR: NEGATIVE

## 2019-07-08 LAB — LIPASE, BLOOD: Lipase: 17 U/L (ref 11–51)

## 2019-07-08 LAB — LACTIC ACID, PLASMA
Lactic Acid, Venous: 4.4 mmol/L (ref 0.5–1.9)
Lactic Acid, Venous: 3.5 mmol/L (ref 0.5–1.9)

## 2019-07-08 LAB — GLUCOSE, CAPILLARY: Glucose-Capillary: 148 mg/dL — ABNORMAL HIGH (ref 70–99)

## 2019-07-08 MED ORDER — ONDANSETRON 4 MG PO TBDP
4.0000 mg | ORAL_TABLET | Freq: Once | ORAL | Status: AC | PRN
  Administered 2019-07-08: 15:00:00 4 mg via ORAL
  Filled 2019-07-08: qty 1

## 2019-07-08 MED ORDER — ONDANSETRON HCL 4 MG/2ML IJ SOLN
4.0000 mg | Freq: Four times a day (QID) | INTRAMUSCULAR | Status: DC | PRN
  Administered 2019-07-09 – 2019-07-11 (×2): 4 mg via INTRAVENOUS
  Filled 2019-07-08 (×2): qty 2

## 2019-07-08 MED ORDER — ACETAMINOPHEN 650 MG RE SUPP: 650.0000 mg | Freq: Four times a day (QID) | RECTAL | Status: DC | PRN

## 2019-07-08 MED ORDER — DEXTROSE 50 % IV SOLN
12.5000 g | Freq: Once | INTRAVENOUS | Status: AC
  Administered 2019-07-08: 12.5 g via INTRAVENOUS
  Filled 2019-07-08: qty 50

## 2019-07-08 MED ORDER — INSULIN ASPART 100 UNIT/ML ~~LOC~~ SOLN
0.0000 [IU] | Freq: Three times a day (TID) | SUBCUTANEOUS | Status: DC
  Administered 2019-07-11: 2 [IU] via SUBCUTANEOUS
  Administered 2019-07-11: 5 [IU] via SUBCUTANEOUS
  Administered 2019-07-12: 2 [IU] via SUBCUTANEOUS
  Administered 2019-07-12 (×2): 3 [IU] via SUBCUTANEOUS
  Administered 2019-07-13 – 2019-07-14 (×5): 2 [IU] via SUBCUTANEOUS

## 2019-07-08 MED ORDER — LACTATED RINGERS IV BOLUS
500.0000 mL | Freq: Once | INTRAVENOUS | Status: AC
  Administered 2019-07-08: 17:00:00 500 mL via INTRAVENOUS

## 2019-07-08 MED ORDER — ACETAMINOPHEN 325 MG PO TABS: 650.0000 mg | ORAL_TABLET | Freq: Four times a day (QID) | ORAL | Status: DC | PRN

## 2019-07-08 MED ORDER — SODIUM CHLORIDE 0.9 % IV SOLN
2.0000 g | Freq: Two times a day (BID) | INTRAVENOUS | Status: DC
  Administered 2019-07-09 – 2019-07-10 (×3): 2 g via INTRAVENOUS
  Filled 2019-07-08 (×5): qty 2

## 2019-07-08 MED ORDER — SODIUM ZIRCONIUM CYCLOSILICATE 5 G PO PACK: 5.0000 g | PACK | Freq: Once | ORAL | Status: DC

## 2019-07-08 MED ORDER — METRONIDAZOLE IN NACL 5-0.79 MG/ML-% IV SOLN
500.0000 mg | Freq: Three times a day (TID) | INTRAVENOUS | Status: AC
  Administered 2019-07-09 – 2019-07-14 (×15): 500 mg via INTRAVENOUS
  Filled 2019-07-08 (×15): qty 100

## 2019-07-08 MED ORDER — ONDANSETRON HCL 4 MG PO TABS: 4.0000 mg | ORAL_TABLET | Freq: Four times a day (QID) | ORAL | Status: DC | PRN

## 2019-07-08 MED ORDER — SODIUM CHLORIDE 0.9 % IV SOLN
1000.0000 mL | INTRAVENOUS | Status: DC
  Administered 2019-07-08: 1000 mL via INTRAVENOUS

## 2019-07-08 MED ORDER — FUROSEMIDE 10 MG/ML IJ SOLN
40.0000 mg | Freq: Two times a day (BID) | INTRAMUSCULAR | Status: DC
  Administered 2019-07-08 – 2019-07-09 (×2): 40 mg via INTRAVENOUS
  Filled 2019-07-08 (×2): qty 4

## 2019-07-08 MED ORDER — SODIUM CHLORIDE 0.9 % IV SOLN
2.0000 g | Freq: Once | INTRAVENOUS | Status: AC
  Administered 2019-07-08: 2 g via INTRAVENOUS
  Filled 2019-07-08: qty 2

## 2019-07-08 MED ORDER — POLYETHYLENE GLYCOL 3350 17 G PO PACK: 17.0000 g | PACK | Freq: Every day | ORAL | Status: DC | PRN

## 2019-07-08 MED ORDER — APIXABAN 5 MG PO TABS
5.0000 mg | ORAL_TABLET | Freq: Two times a day (BID) | ORAL | Status: DC
  Administered 2019-07-08 – 2019-07-09 (×3): 5 mg via ORAL
  Filled 2019-07-08 (×3): qty 1

## 2019-07-08 MED ORDER — VANCOMYCIN HCL 2000 MG/400ML IV SOLN
2000.0000 mg | Freq: Once | INTRAVENOUS | Status: AC
  Administered 2019-07-08: 19:00:00 2000 mg via INTRAVENOUS
  Filled 2019-07-08: qty 400

## 2019-07-08 MED ORDER — ALBUTEROL SULFATE (2.5 MG/3ML) 0.083% IN NEBU: 3.0000 mL | INHALATION_SOLUTION | Freq: Four times a day (QID) | RESPIRATORY_TRACT | Status: DC | PRN

## 2019-07-08 MED ORDER — VANCOMYCIN HCL IN DEXTROSE 1-5 GM/200ML-% IV SOLN: 1000.0000 mg | Freq: Once | INTRAVENOUS | Status: DC

## 2019-07-08 MED ORDER — VANCOMYCIN HCL 1250 MG/250ML IV SOLN: 1250.0000 mg | INTRAVENOUS | Status: DC

## 2019-07-08 MED ORDER — SODIUM CHLORIDE 0.9 % IV BOLUS (SEPSIS)
500.0000 mL | Freq: Once | INTRAVENOUS | Status: AC
  Administered 2019-07-08: 18:00:00 500 mL via INTRAVENOUS

## 2019-07-08 MED ORDER — PROMETHAZINE HCL 25 MG/ML IJ SOLN
12.5000 mg | Freq: Once | INTRAMUSCULAR | Status: AC
  Administered 2019-07-08: 12.5 mg via INTRAVENOUS
  Filled 2019-07-08: qty 1

## 2019-07-08 MED ORDER — INSULIN GLARGINE 100 UNIT/ML ~~LOC~~ SOLN
8.0000 [IU] | Freq: Every day | SUBCUTANEOUS | Status: DC
  Administered 2019-07-08 – 2019-07-09 (×2): 8 [IU] via SUBCUTANEOUS
  Filled 2019-07-08 (×3): qty 0.08

## 2019-07-08 MED ORDER — SODIUM CHLORIDE 0.9% FLUSH
3.0000 mL | INTRAVENOUS | Status: DC | PRN
  Administered 2019-07-09 – 2019-07-11 (×2): 3 mL via INTRAVENOUS

## 2019-07-08 MED ORDER — DILTIAZEM HCL ER COATED BEADS 180 MG PO CP24
300.0000 mg | ORAL_CAPSULE | Freq: Every day | ORAL | Status: DC
  Administered 2019-07-09 – 2019-07-14 (×6): 300 mg via ORAL
  Filled 2019-07-08 (×6): qty 1

## 2019-07-08 MED ORDER — SODIUM ZIRCONIUM CYCLOSILICATE 5 G PO PACK
5.0000 g | PACK | Freq: Once | ORAL | Status: AC
  Administered 2019-07-08: 5 g via ORAL
  Filled 2019-07-08: qty 1

## 2019-07-08 MED ORDER — INSULIN ASPART 100 UNIT/ML ~~LOC~~ SOLN
10.0000 [IU] | Freq: Once | SUBCUTANEOUS | Status: AC
  Administered 2019-07-08: 10 [IU] via SUBCUTANEOUS

## 2019-07-08 MED ORDER — INSULIN ASPART 100 UNIT/ML ~~LOC~~ SOLN
0.0000 [IU] | Freq: Every day | SUBCUTANEOUS | Status: DC
  Administered 2019-07-11 – 2019-07-13 (×3): 2 [IU] via SUBCUTANEOUS

## 2019-07-08 MED ORDER — METRONIDAZOLE IN NACL 5-0.79 MG/ML-% IV SOLN
500.0000 mg | Freq: Once | INTRAVENOUS | Status: AC
  Administered 2019-07-08: 18:00:00 500 mg via INTRAVENOUS
  Filled 2019-07-08: qty 100

## 2019-07-08 NOTE — H&P (Addendum)
History and Physical    LIZETT CHOWNING QPY:[id number] DOB: 16-Jul-1945 DOA: 07/08/2019  PCP: [name], MD   Patient coming from: Home  I have personally briefly reviewed patient's old medical records in [place]  Chief Complaint: Generalized weakness, difficulty breathing  HPI: Destiny Nash is a 74 y.o. female with medical history significant for breast cancer on chemotherapy, diabetes mellitus, hypertension, atrial fibrillation.  Patient presented to the ED with complaints of generalized weakness over the past several days.  Her son reports difficulty breathing that started today.  No cough.  No chest pain.  She had has bilateral chronic lower extremity swelling, that she thinks has worsened over the past 2 to 3 weeks. She reports about 4 episodes of vomiting today, none prior.  She also reports chronic loose stools-averaging 2 episodes per day which is unchanged and related to her chemotherapy.  No abdominal pain.  No dysuria. She reports yesterday she was so weak that she fell while trying to climb the stairs.  She landed on her buttock.  She denies pain to pelvic area.  She did not lose consciousness and she did not hit her head. Patient is on treatment for cellulitis involving her lower extremity, doxycycline, for several days.  She has chronic redness of her lower extremity which has not significantly changed.  ED Course: Mild intermittent tachycardia to 102, temperature 97.7.  Blood pressure 10 7-1 29, O2 sats down to 84% on room air, placed on 3 L nasal cannula.  Creatinine elevated at 1.4, baseline 0.8-1.  Potassium elevated at 6.2.  Significant lactic acidosis to 4.4.  Portable chest x-ray shows mild cardiomegaly with vascular congestion and probable trace bilateral pleural effusions.  1 L bolus normal saline given in the ED.  UA and urine cultures pending.  Broad spectrum antibiotics IV vancomycin, cefepime and metronidazole started for possible sepsis.  Lokelma 5 mg  given.  Hospitalist to admit for further evaluation and treatment.  Review of Systems: As per HPI all other systems reviewed and negative.  Past Medical History:  Diagnosis Date  . Anemia    in the past   . Anxiety   . Atrial fibrillation ([place])    a. diagnosed in 02/2018.  . Bronchitis    easily  develops bronchitis when she has a cold  . Cancer ([place])    L breast - CA, Feb. 4th   . Cellulitis   . Chronic kidney disease   . Diabetes mellitus without complication ([place])    type 2  . Dyspnea   . Dysrhythmia    afib, treating with Eliquis  . Family history of breast cancer   . Family history of uterine cancer   . Heart murmur   . Hyperlipidemia   . Hypertension     Past Surgical History:  Procedure Laterality Date  . BREAST LUMPECTOMY WITH RADIOACTIVE SEED AND SENTINEL LYMPH NODE BIOPSY Left 12/10/2018   Procedure: LEFT BREAST LUMPECTOMY WITH RADIOACTIVE SEED AND LEFT SENTINEL LYMPH NODE MAPPING;  Surgeon: Erroll Luna, MD;  Location: [facility];  Service: General;  Laterality: Left;  . DILATION AND CURETTAGE OF UTERUS     x2 on same day  . PORTACATH PLACEMENT N/A 08/01/2018   Procedure: INSERTION PORT-A-CATH WITH ULTRASOUND;  Surgeon: Erroll Luna, MD;  Location: [facility];  Service: General;  Laterality: N/A;  . TONSILLECTOMY AND ADENOIDECTOMY       reports that she has never smoked. She has never used smokeless tobacco. She reports previous  alcohol use. She reports that she does not use drugs.  Allergies  Allergen Reactions  . Levaquin [Levofloxacin] Other (See Comments)    Joint "seize" and constipation  . Codeine Other (See Comments)    Severe headache    Family History  Problem Relation Age of Onset  . CAD Brother   . Hypertension Mother   . Hyperlipidemia Mother   . Hypertension Brother   . Uterine cancer Maternal Grandmother   . Breast cancer Cousin        dx in her 53s; mat first cousin  . Breast cancer Cousin        dx in her 63s-60s; mat first cousin     Prior to Admission medications   Medication Sig Start Date End Date Taking? Authorizing Provider  acetaminophen (TYLENOL) 500 MG tablet Take 1,000 mg by mouth every 6 (six) hours as needed for moderate pain or headache.   Yes [provider]  albuterol (PROVENTIL HFA;VENTOLIN HFA) 108 (90 Base) MCG/ACT inhaler Inhale 2 puffs into the lungs every 6 (six) hours as needed for wheezing or shortness of breath.    [provider]  apixaban (ELIQUIS) 5 MG TABS tablet Take 1 tablet (5 mg total) by mouth 2 (two) times daily. 02/15/18   Orson Eva, MD  cholestyramine Lucrezia Starch) 4 g packet Take 1 packet (4 g total) by mouth 3 (three) times daily with meals. Patient taking differently: Take 4 g by mouth daily as needed.  09/06/18   Nicholas Lose, MD  CINNAMON PO Take 1 tablet by mouth daily.    [provider]  diltiazem (CARDIZEM CD) 300 MG 24 hr capsule Take 1 capsule (300 mg total) by mouth daily. 07/01/18   Arnoldo Lenis, MD  diphenoxylate-atropine (LOMOTIL) 2.5-0.025 MG tablet TAKE 1 TABLET BY MOUTH 4 TIMES DAILY AS NEEDED FOR DIARRHEA OR  LOOSE  STOOLS 07/07/19   Nicholas Lose, MD  doxycycline (VIBRA-TABS) 100 MG tablet Take 1 tablet (100 mg total) by mouth 2 (two) times daily. 07/04/19   Nicholas Lose, MD  furosemide (LASIX) 40 MG tablet Take 1.5 tablets (60 mg total) by mouth daily. 02/12/19 05/13/19  Arnoldo Lenis, MD  insulin detemir (LEVEMIR) 100 UNIT/ML injection Inject 0.11 mLs (11 Units total) into the skin at bedtime. Patient taking differently: Inject 8 Units into the skin at bedtime.  10/21/18   Barton Dubois, MD  LORazepam (ATIVAN) 1 MG tablet Take 1 tablet 30 min before radiotherapy, PRN anxiety and claustrophobia. 03/10/19   Nicholas Lose, MD  Magnesium Oxide 400 MG CAPS Take 1 capsule (400 mg total) by mouth daily. 04/24/19   Arnoldo Lenis, MD  metFORMIN (GLUCOPHAGE) 500 MG tablet Take 1,000 mg by mouth 2 (two) times daily with a meal.     [provider]  niacin 500 MG tablet Take 500 mg by mouth daily.    [provider]  NYSTATIN powder Apply 1 Bottle topically daily as needed (irritation).  10/29/18   [provider]  Omega-3 1000 MG CAPS Take 1,000 mg by mouth 2 (two) times daily.    [provider]  ondansetron (ZOFRAN) 8 MG tablet Take 1 tablet (8 mg total) by mouth 2 (two) times daily as needed (Nausea or vomiting). 03/06/19   Nicholas Lose, MD  pantoprazole (PROTONIX) 40 MG tablet Take 1 tablet by mouth once daily 04/17/19   Nicholas Lose, MD  potassium chloride SA (KLOR-CON) 20 MEQ tablet Take 1 tablet (20 mEq total) by  mouth 2 (two) times daily. 03/20/19   Nicholas Lose, MD  pravastatin (PRAVACHOL) 20 MG tablet Take 20 mg by mouth daily before breakfast.  01/03/18   [provider]  prochlorperazine (COMPAZINE) 10 MG tablet Take 1 tablet (10 mg total) by mouth every 6 (six) hours as needed (Nausea or vomiting). 03/06/19   Nicholas Lose, MD  traMADol (ULTRAM) 50 MG tablet Take 1 tablet (50 mg total) by mouth every 6 (six) hours as needed. 12/10/18 12/10/19  Erroll Luna, MD    Physical Exam: Vitals:   07/08/19 1700 07/08/19 1715 07/08/19 1730 07/08/19 1800  BP: (!) 120/51   109/90  Pulse: (!) 102   85  Resp: (!) 31 20 19 18   Temp:      TempSrc:      SpO2:    (!) 85%  Weight:      Height:        Constitutional: NAD, calm, comfortable Vitals:   07/08/19 1700 07/08/19 1715 07/08/19 1730 07/08/19 1800  BP: (!) 120/51   109/90  Pulse: (!) 102   85  Resp: (!) 31 20 19 18   Temp:      TempSrc:      SpO2:    (!) 85%  Weight:      Height:       Eyes: PERRL, lids and conjunctivae normal ENMT: Mucous membranes are moist.  Neck: normal, supple, no masses, no thyromegaly Respiratory: Normal respiratory effort. No accessory muscle use.  Cardiovascular: Regular rate and rhythm, no murmurs / rubs / gallops.  3 + Bilateral pitting extremity edema to knees with chronic stasis changes mid  leg.  Bilateral lower extremities warm Abdomen: pannus, no tenderness, soft, no masses palpated. No hepatosplenomegaly. Bowel sounds positive.  Musculoskeletal: no clubbing / cyanosis. No joint deformity upper and lower extremities. Good ROM, no contractures.  Skin: Chronic stasis changes to mid legs bilaterally, no ulcers. No induration Neurologic: No gross cranial nerve abnormality, bilateral upper and lower extremity strength 4/5, Psychiatric: Normal judgment and insight. Alert and oriented x 3. Normal mood.   Labs on Admission: I have personally reviewed following labs and imaging studies  CBC: Recent Labs  Lab 07/04/19 1047 07/08/19 1527  WBC 9.8 14.6*  NEUTROABS 7.7  --   HGB 8.6* 8.6*  HCT 28.3* 28.7*  MCV 71.5* 74.7*  PLT 136* 038*   Basic Metabolic Panel: Recent Labs  Lab 07/04/19 1047 07/08/19 1527  NA 135 130*  K 4.7 6.2*  CL 102 99  CO2 25 20*  GLUCOSE 127* 171*  BUN 35* 47*  CREATININE 1.05* 1.42*  CALCIUM 8.1* 8.5*   Liver Function Tests: Recent Labs  Lab 07/04/19 1047 07/08/19 1527  AST 27 51*  ALT 15 23  ALKPHOS 196* 218*  BILITOT 1.3* 1.8*  PROT 6.7 6.6  ALBUMIN 2.7* 2.7*   Recent Labs  Lab 07/08/19 1527  LIPASE 17    Radiological Exams on Admission: DG Chest Portable 1 View  Result Date: 07/08/2019 CLINICAL DATA:  74 year old female with weakness. EXAM: PORTABLE CHEST 1 VIEW COMPARISON:  Chest radiograph dated 01/15/2019 and CT dated 01/15/2019. FINDINGS: Right sided Port-A-Cath in similar position. There is mild cardiomegaly with vascular congestion and probable trace bilateral pleural effusions. No focal consolidation or pneumothorax. No acute osseous pathology. Surgical clips of the left breast noted. IMPRESSION: Mild cardiomegaly with vascular congestion and probable trace bilateral pleural effusions. No focal consolidation. Electronically Signed   By: Anner Crete M.D.   On: 07/08/2019  16:26    EKG:   Pending  Assessment/Plan Principal Problem:   Diastolic congestive heart failure (HCC) Active Problems:   Essential hypertension   Unspecified atrial fibrillation (HCC)   Atrial fibrillation (HCC)   Type 2 diabetes mellitus (HCC)   Generalized weakness   Breast cancer (HCC)    Decompensated diastolic CHF - 3+ pitting pedal edema, chest x-ray showing vascular congestion, patient hypoxic with dyspnea, O2 sats 84% on room air.  Not on home O2.  Resp panel negative for COVID-19 infection.  Albumin also low at 2.7.  BNP unremarkable at 78, but patient is obese.  Last echo 02/2019-EF 60 to 65%, mild increased LV wall thickness.  Home medications Lasix 60 mg daily.  Pulmonary embolism as she is on Eliquis. Follows with Dr. Harl Bowie - Follow-up BNP - Bilateral lower extremity venous Dopplers -Obtain echocardiogram -IV Lasix 40 mg every 12 hourly - BMP a.m -Input output, daily weights  Hypervolemic hyponatremia-sodium 130.  Baseline sodium 135-137. -IV Lasix -BMP a.m.  Hyperkalemia-6.2.  Likely from home potassium supplements 20 mg twice daily. -Lokelma 5 mg given in ED -NovoLog 10 units with 12.46m D50 -BMP a.m. -Hold home potassium supplements  AKI On CKD 3- Cr 1.4, baseline 0.8-1.  Likely cardiorenal. -IV Lasix -BMP a.m.  Generalized weakness- likely from volume overload.  -PT evaluation when able   SIRs- Mild initial tachycardia, with leukocytosis 14.6, with lactic acidosis of 4.4.  Chest x-ray showing vascular congestion, small pleural effusions.  At this time I doubt infectious etiology, but she is immunocompromised with lung cancer on chemotherapy.  Lower extremity do not appear cellulitic, but rather with chronic stasis changes.  Has been on doxycycline -Follow-up blood cultures -Follow-up UA and urine cultures -For now will continue broad-spectrum IV antibiotics vancomycin cefepime metronidazole started in ED -CBC, BMP a.m. - Hold doxycycline -With persistent lactic  acidosis, will obtain CT chest and abdomen without contrast.  Elevated liver enzymes-AST 51,  ALP 218, total bilirubin 1.8.  Prior mild elevations in ALP.  No abdominal tenderness. -Right upper quadrant ultrasound  -Continue broad-spectrum antibiotics.  Diabetes mellitus-random glucose 171.  - Hgba1c - SSI -Hold home Metformin, -Resume home Lantus  Hypertension-stable. 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 follows with Dr. GLindi Adie  S/p left lumpectomy, adjuvant radiation and adjuvant antiestrogen. Currently on chemotherapy q3 week with kadcyla.  Atrial fibrillation- rate controlled and on anticoagulation with eliquis. -Resume home Cardizem and Eliquis.   DVT prophylaxis: Eliquis Code Status: Full code Family Communication: None at bedside Disposition Plan: Pending diuresis and improvement in respiratory status Consults called: None Admission status: Inpatient, telemetry I certify that at the point of admission it is my clinical judgment that the patient will require inpatient hospital care spanning beyond 2 midnights from the point of admission due to high intensity of service, high risk for further deterioration and high frequency of surveillance required. The following factors support the patient status of inpatient: IV diuresis and ruling out possible sepsis.   EBethena RoysMD [facility]  07/08/2019, 8:26 PM

## 2019-07-08 NOTE — Progress Notes (Signed)
Pharmacy Antibiotic Note  Destiny Nash is a 73 y.o. female admitted on 07/08/2019 with sepsis.  Pharmacy has been consulted for vancomycin and cefepime dosing.  Plan: Vancomycin 1245m IV every 24 hours.  Goal trough 15-20 mcg/mL. cefepime 2gm iv q12h  Height: 5' 2"  (157.5 cm) Weight: 252 lb 13.9 oz (114.7 kg) IBW/kg (Calculated) : 50.1  Temp (24hrs), Avg:97.7 F (36.5 C), Min:97.7 F (36.5 C), Max:97.7 F (36.5 C)  Recent Labs  Lab 07/04/19 1047 07/08/19 1527 07/08/19 1702  WBC 9.8 14.6*  --   CREATININE 1.05* 1.42*  --   LATICACIDVEN  --   --  4.4*    Estimated Creatinine Clearance: 43.5 mL/min (A) (by C-G formula based on SCr of 1.42 mg/dL (H)).    Allergies  Allergen Reactions  . Levaquin [Levofloxacin] Other (See Comments)    Joint "seize" and constipation  . Codeine Other (See Comments)    Severe headache    Antimicrobials this admission: 2/1 vancomycin >>  2/1 cefepime >>   Microbiology results: 2/2 Respirator Panel: sent 2/2 BCx: sent   Thank you for allowing pharmacy to be a part of this patient's care.  GDonna ChristenCoffee 07/08/2019 5:56 PM

## 2019-07-08 NOTE — ED Notes (Signed)
CRITICAL VALUE ALERT  Critical Value:  Lactic 4.4  Date & Time Notied:  07/08/19 1738  Provider Notified: Dr. Tomi Bamberger   Orders Received/Actions taken: None yet

## 2019-07-08 NOTE — ED Triage Notes (Signed)
From home   Pt has had zofran 4 mg in an IV that was pulled by EMS after finding that pt is active breast Ca pt   [place] 1/1 and was eval'd here transferred to [facility] per EMS- Since returning home, she has not wanted to get out of bed , has been sick and falling   Family reports to EMS they do  Not know why

## 2019-07-08 NOTE — Progress Notes (Signed)
CBG 148 unable to transfer to epic

## 2019-07-08 NOTE — ED Notes (Signed)
Pt report is different from EMS  She reports living alone, daughter in the back yard per her report and helps her.   Weak recently after fall 12/31- She reports she saw her onc recently and was released until July   Weak and just couldn't do anything but did not fall, just sat down per her report

## 2019-07-08 NOTE — ED Provider Notes (Signed)
[facility] EMERGENCY DEPARTMENT Provider Note   CSN: [id number] Arrival date & time: 07/08/19  1442     History Chief Complaint  Patient presents with  . Weakness  . Nausea    Destiny Nash is a 74 y.o. female.  HPI   Patient presents to the ED for evaluation of generalized weakness.  Patient is undergoing chemotherapy for breast cancer.  Patient her last chemotherapy treatment on January 29.  Patient states she has been having trouble with weakness since then.  She has been having some diarrhea and had an episode of nausea and vomiting today.  Patient has not had much appetite.  She was taking her shower today when she felt extremely weak in her legs and had to sit back down.  Patient was unable to get up so she called her daughter for help.  The daughter her called 30.  Patient denies any recent fevers.  No complaints of pain.  Her weakness is generalized and not focal.  Patient states she has been on antibiotics recently for cellulitis and has noticed some increased leg swelling.  Past Medical History:  Diagnosis Date  . Anemia    in the past   . Anxiety   . Atrial fibrillation ([place])    a. diagnosed in 02/2018.  . Bronchitis    easily  develops bronchitis when she has a cold  . Cancer ([place])    L breast - CA, Feb. 4th   . Cellulitis   . Chronic kidney disease   . Diabetes mellitus without complication ([place])    type 2  . Dyspnea   . Dysrhythmia    afib, treating with Eliquis  . Family history of breast cancer   . Family history of uterine cancer   . Heart murmur   . Hyperlipidemia   . Hypertension     Patient Active Problem List   Diagnosis Date Noted  . Hypoglycemia 10/20/2018  . Atrial fibrillation ([place]) 10/20/2018  . Type 2 diabetes mellitus ([place]) 10/20/2018  . Genetic testing 10/14/2018  . Family history of breast cancer   . Family history of uterine cancer   . Port-A-Cath in place 08/09/2018  . Malignant neoplasm of upper-outer quadrant of left breast  in female, estrogen receptor positive ([place]) 07/25/2018  . Unspecified atrial fibrillation ([place]) 02/14/2018  . Cellulitis, leg 02/13/2018  . Uncontrolled type 2 diabetes mellitus with hyperglycemia, with long-term current use of insulin ([place]) 02/13/2018  . Essential hypertension 02/13/2018  . Hyperlipidemia 02/13/2018  . Obesity, Class III, BMI 40-49.9 (morbid obesity) ([place]) 02/13/2018    Past Surgical History:  Procedure Laterality Date  . BREAST LUMPECTOMY WITH RADIOACTIVE SEED AND SENTINEL LYMPH NODE BIOPSY Left 12/10/2018   Procedure: LEFT BREAST LUMPECTOMY WITH RADIOACTIVE SEED AND LEFT SENTINEL LYMPH NODE MAPPING;  Surgeon: Erroll Luna, MD;  Location: [place];  Service: General;  Laterality: Left;  . DILATION AND CURETTAGE OF UTERUS     x2 on same day  . PORTACATH PLACEMENT N/A 08/01/2018   Procedure: INSERTION PORT-A-CATH WITH ULTRASOUND;  Surgeon: Erroll Luna, MD;  Location: [place];  Service: General;  Laterality: N/A;  . TONSILLECTOMY AND ADENOIDECTOMY       OB History   No obstetric history on file.     Family History  Problem Relation Age of Onset  . CAD Brother   . Hypertension Mother   . Hyperlipidemia Mother   . Hypertension Brother   . Uterine cancer Maternal Grandmother   . Breast cancer  Cousin        dx in her 46s; mat first cousin  . Breast cancer Cousin        dx in her 63s-60s; mat first cousin    Social History   Tobacco Use  . Smoking status: Never Smoker  . Smokeless tobacco: Never Used  Substance Use Topics  . Alcohol use: Not Currently  . Drug use: Never    Home Medications Prior to Admission medications   Medication Sig Start Date End Date Taking? Authorizing Provider  acetaminophen (TYLENOL) 500 MG tablet Take 1,000 mg by mouth every 6 (six) hours as needed for moderate pain or headache.    [provider]  albuterol (PROVENTIL HFA;VENTOLIN HFA) 108 (90 Base) MCG/ACT inhaler Inhale 2 puffs into the lungs every 6 (six) hours as  needed for wheezing or shortness of breath.    [provider]  apixaban (ELIQUIS) 5 MG TABS tablet Take 1 tablet (5 mg total) by mouth 2 (two) times daily. 02/15/18   Orson Eva, MD  cholestyramine Lucrezia Starch) 4 g packet Take 1 packet (4 g total) by mouth 3 (three) times daily with meals. Patient taking differently: Take 4 g by mouth daily as needed.  09/06/18   Nicholas Lose, MD  CINNAMON PO Take 1 tablet by mouth daily.    [provider]  diltiazem (CARDIZEM CD) 300 MG 24 hr capsule Take 1 capsule (300 mg total) by mouth daily. 07/01/18   Arnoldo Lenis, MD  diphenoxylate-atropine (LOMOTIL) 2.5-0.025 MG tablet TAKE 1 TABLET BY MOUTH 4 TIMES DAILY AS NEEDED FOR DIARRHEA OR  LOOSE  STOOLS 07/07/19   Nicholas Lose, MD  doxycycline (VIBRA-TABS) 100 MG tablet Take 1 tablet (100 mg total) by mouth 2 (two) times daily. 07/04/19   Nicholas Lose, MD  furosemide (LASIX) 40 MG tablet Take 1.5 tablets (60 mg total) by mouth daily. 02/12/19 05/13/19  Arnoldo Lenis, MD  insulin detemir (LEVEMIR) 100 UNIT/ML injection Inject 0.11 mLs (11 Units total) into the skin at bedtime. Patient taking differently: Inject 8 Units into the skin at bedtime.  10/21/18   Barton Dubois, MD  LORazepam (ATIVAN) 1 MG tablet Take 1 tablet 30 min before radiotherapy, PRN anxiety and claustrophobia. 03/10/19   Nicholas Lose, MD  Magnesium Oxide 400 MG CAPS Take 1 capsule (400 mg total) by mouth daily. 04/24/19   Arnoldo Lenis, MD  metFORMIN (GLUCOPHAGE) 500 MG tablet Take 1,000 mg by mouth 2 (two) times daily with a meal.     [provider]  niacin 500 MG tablet Take 500 mg by mouth daily.    [provider]  NYSTATIN powder Apply 1 Bottle topically daily as needed (irritation).  10/29/18   [provider]  Omega-3 1000 MG CAPS Take 1,000 mg by mouth 2 (two) times daily.    [provider]  ondansetron (ZOFRAN) 8 MG tablet Take 1 tablet (8 mg total) by mouth 2 (two) times daily  as needed (Nausea or vomiting). 03/06/19   Nicholas Lose, MD  pantoprazole (PROTONIX) 40 MG tablet Take 1 tablet by mouth once daily 04/17/19   Nicholas Lose, MD  potassium chloride SA (KLOR-CON) 20 MEQ tablet Take 1 tablet (20 mEq total) by mouth 2 (two) times daily. 03/20/19   Nicholas Lose, MD  pravastatin (PRAVACHOL) 20 MG tablet Take 20 mg by mouth daily before breakfast.  01/03/18   [provider]  prochlorperazine (COMPAZINE) 10 MG tablet Take 1 tablet (10 mg total)  by mouth every 6 (six) hours as needed (Nausea or vomiting). 03/06/19   Nicholas Lose, MD  traMADol (ULTRAM) 50 MG tablet Take 1 tablet (50 mg total) by mouth every 6 (six) hours as needed. 12/10/18 12/10/19  Erroll Luna, MD    Allergies    Levaquin [levofloxacin] and Codeine  Review of Systems   Review of Systems  All other systems reviewed and are negative.   Physical Exam Updated Vital Signs BP (!) 120/51   Pulse (!) 102   Temp 97.7 F (36.5 C) (Oral)   Resp 19   Ht 1.575 m (5' 2" )   Wt 114.7 kg   SpO2 (!) 84%   BMI 46.25 kg/m   Physical Exam Vitals and nursing note reviewed.  Constitutional:      Appearance: She is well-developed. She is ill-appearing.  HENT:     Head: Normocephalic and atraumatic.     Right Ear: External ear normal.     Left Ear: External ear normal.  Eyes:     General: No scleral icterus.       Right eye: No discharge.        Left eye: No discharge.     Conjunctiva/sclera: Conjunctivae normal.  Neck:     Trachea: No tracheal deviation.  Cardiovascular:     Rate and Rhythm: Normal rate and regular rhythm.  Pulmonary:     Effort: Pulmonary effort is normal. No respiratory distress.     Breath sounds: Normal breath sounds. No stridor. No wheezing or rales.  Abdominal:     General: Bowel sounds are normal. There is no distension.     Palpations: Abdomen is soft.     Tenderness: There is no abdominal tenderness. There is no guarding or rebound.  Musculoskeletal:         General: No tenderness.     Cervical back: Neck supple.     Right lower leg: Edema present.     Left lower leg: Edema present.     Comments: Erythema bilateral lower extrem, suggestive of venous stasis dermatitis, no increased warmth  Skin:    General: Skin is warm and dry.     Findings: No rash.  Neurological:     Mental Status: She is alert.     Cranial Nerves: No cranial nerve deficit (no facial droop, extraocular movements intact, no slurred speech).     Sensory: No sensory deficit.     Motor: No abnormal muscle tone or seizure activity.     Coordination: Coordination normal.     ED Results / Procedures / Treatments   Labs (all labs ordered are listed, but only abnormal results are displayed) Labs Reviewed  COMPREHENSIVE METABOLIC PANEL - Abnormal; Notable for the following components:      Result Value   Sodium 130 (*)    Potassium 6.2 (*)    CO2 20 (*)    Glucose, Bld 171 (*)    BUN 47 (*)    Creatinine, Ser 1.42 (*)    Calcium 8.5 (*)    Albumin 2.7 (*)    AST 51 (*)    Alkaline Phosphatase 218 (*)    Total Bilirubin 1.8 (*)    GFR calc non Af Amer 37 (*)    GFR calc Af Amer 43 (*)    All other components within normal limits  CBC - Abnormal; Notable for the following components:   WBC 14.6 (*)    RBC 3.84 (*)    Hemoglobin 8.6 (*)  HCT 28.7 (*)    MCV 74.7 (*)    MCH 22.4 (*)    RDW 25.9 (*)    Platelets 136 (*)    All other components within normal limits  LACTIC ACID, PLASMA - Abnormal; Notable for the following components:   Lactic Acid, Venous 4.4 (*)    All other components within normal limits  RESPIRATORY PANEL BY RT PCR (FLU A&B, COVID)  CULTURE, BLOOD (ROUTINE X 2)  CULTURE, BLOOD (ROUTINE X 2)  LIPASE, BLOOD  URINALYSIS, ROUTINE W REFLEX MICROSCOPIC  BRAIN NATRIURETIC PEPTIDE    EKG Atrial fibrillation rate 93 Low QRS voltage diffusely Normal ST-T waves No prior EKG for comparison  Radiology DG Chest Portable 1 View  Result Date:  07/08/2019 CLINICAL DATA:  74 year old female with weakness. EXAM: PORTABLE CHEST 1 VIEW COMPARISON:  Chest radiograph dated 01/15/2019 and CT dated 01/15/2019. FINDINGS: Right sided Port-A-Cath in similar position. There is mild cardiomegaly with vascular congestion and probable trace bilateral pleural effusions. No focal consolidation or pneumothorax. No acute osseous pathology. Surgical clips of the left breast noted. IMPRESSION: Mild cardiomegaly with vascular congestion and probable trace bilateral pleural effusions. No focal consolidation. Electronically Signed   By: Anner Crete M.D.   On: 07/08/2019 16:26    Procedures .Critical Care Performed by: Dorie Rank, MD Authorized by: Dorie Rank, MD   Critical care provider statement:    Critical care time (minutes):  45   Critical care was time spent personally by me on the following activities:  Discussions with consultants, evaluation of patient's response to treatment, examination of patient, ordering and performing treatments and interventions, ordering and review of laboratory studies, ordering and review of radiographic studies, pulse oximetry, re-evaluation of patient's condition, obtaining history from patient or surrogate and review of old charts   (including critical care time)  Medications Ordered in ED Medications  sodium chloride 0.9 % bolus 500 mL (has no administration in time range)    Followed by  0.9 %  sodium chloride infusion (has no administration in time range)  ceFEPIme (MAXIPIME) 2 g in sodium chloride 0.9 % 100 mL IVPB (has no administration in time range)  metroNIDAZOLE (FLAGYL) IVPB 500 mg (has no administration in time range)  vancomycin (VANCOCIN) IVPB 1000 mg/200 mL premix (has no administration in time range)  ondansetron (ZOFRAN-ODT) disintegrating tablet 4 mg (4 mg Oral Given 07/08/19 1511)  lactated ringers bolus 500 mL (500 mLs Intravenous New Bag/Given 07/08/19 1643)  promethazine (PHENERGAN) injection 12.5  mg (12.5 mg Intravenous Given 07/08/19 1727)    ED Course  I have reviewed the triage vital signs and the nursing notes.  Pertinent labs & imaging results that were available during my care of the patient were reviewed by me and considered in my medical decision making (see chart for details).  Clinical Course as of Jul 08 1739  Tue Jul 08, 2019  1658 Electrolyte panel notable for hyperkalemia and acute kidney injury   [JK]  1739 Lactic acid elevated.  No definite signs of infection but she does have a leukocytosis.  Patient is a immunocompromised with her chemotherapy.  I will cover on empiric antibiotics.   [JK]    Clinical Course User Index [JK] Dorie Rank, MD   MDM Rules/Calculators/A&P                     Patient presents with weakness.  Recently had chemotherapy.  Her laboratory test shows evidence of acute kidney injury and  hyperkalemia.  Patient is having persistent nausea vomiting and I suspect this is all related to dehydration related to fluid losses from her nausea and vomiting from the chemotherapy.  Elevated lactic acid level likely from dehydration.  Will add abx empirically and add on cultures. Patient has been started on IV fluids.  Initially she was given lactated Ringer's but I will switch to normal saline.  Chest x-ray does show vascular congestion.  We will need to monitor closely.  I will add on a Covid test and BMP.  I will consult the medical service for admission and further treatment. Final Clinical Impression(s) / ED Diagnoses Final diagnoses:  AKI (acute kidney injury) ([place])  Pulmonary vascular congestion  Hyperkalemia      Dorie Rank, MD 07/08/19 [phone]

## 2019-07-08 NOTE — ED Notes (Signed)
[place], RN has accessed her port a cath  Labs, lactic and cultures drawn just in case needed   EPD to eval

## 2019-07-08 NOTE — Progress Notes (Signed)
MD notified of lactic 3.5 previous value 4.1. Requested instructions on recheck.

## 2019-07-09 ENCOUNTER — Inpatient Hospital Stay (HOSPITAL_COMMUNITY): Payer: Medicare Other

## 2019-07-09 ENCOUNTER — Other Ambulatory Visit: Payer: Self-pay

## 2019-07-09 DIAGNOSIS — K7581 Nonalcoholic steatohepatitis (NASH): Secondary | ICD-10-CM

## 2019-07-09 DIAGNOSIS — Z794 Long term (current) use of insulin: Secondary | ICD-10-CM

## 2019-07-09 DIAGNOSIS — Z7901 Long term (current) use of anticoagulants: Secondary | ICD-10-CM

## 2019-07-09 DIAGNOSIS — I5031 Acute diastolic (congestive) heart failure: Secondary | ICD-10-CM

## 2019-07-09 DIAGNOSIS — K7469 Other cirrhosis of liver: Secondary | ICD-10-CM | POA: Diagnosis present

## 2019-07-09 DIAGNOSIS — R188 Other ascites: Secondary | ICD-10-CM

## 2019-07-09 DIAGNOSIS — I10 Essential (primary) hypertension: Secondary | ICD-10-CM

## 2019-07-09 DIAGNOSIS — K8010 Calculus of gallbladder with chronic cholecystitis without obstruction: Secondary | ICD-10-CM

## 2019-07-09 DIAGNOSIS — L8990 Pressure ulcer of unspecified site, unspecified stage: Secondary | ICD-10-CM | POA: Diagnosis present

## 2019-07-09 DIAGNOSIS — E11649 Type 2 diabetes mellitus with hypoglycemia without coma: Secondary | ICD-10-CM

## 2019-07-09 DIAGNOSIS — K8020 Calculus of gallbladder without cholecystitis without obstruction: Secondary | ICD-10-CM | POA: Diagnosis present

## 2019-07-09 DIAGNOSIS — K7460 Unspecified cirrhosis of liver: Secondary | ICD-10-CM | POA: Diagnosis present

## 2019-07-09 LAB — URINALYSIS, ROUTINE W REFLEX MICROSCOPIC
Bilirubin Urine: NEGATIVE
Glucose, UA: NEGATIVE mg/dL
Hgb urine dipstick: NEGATIVE
Ketones, ur: NEGATIVE mg/dL
Leukocytes,Ua: NEGATIVE
Nitrite: NEGATIVE
Protein, ur: 30 mg/dL — AB
Specific Gravity, Urine: 1.015 (ref 1.005–1.030)
pH: 5 (ref 5.0–8.0)

## 2019-07-09 LAB — BASIC METABOLIC PANEL
Anion gap: 11 (ref 5–15)
BUN: 48 mg/dL — ABNORMAL HIGH (ref 8–23)
CO2: 20 mmol/L — ABNORMAL LOW (ref 22–32)
Calcium: 8.2 mg/dL — ABNORMAL LOW (ref 8.9–10.3)
Chloride: 96 mmol/L — ABNORMAL LOW (ref 98–111)
Creatinine, Ser: 1.42 mg/dL — ABNORMAL HIGH (ref 0.44–1.00)
GFR calc Af Amer: 43 mL/min — ABNORMAL LOW (ref >60)
GFR calc non Af Amer: 37 mL/min — ABNORMAL LOW (ref >60)
Glucose, Bld: 102 mg/dL — ABNORMAL HIGH (ref 70–99)
Potassium: 5.4 mmol/L — ABNORMAL HIGH (ref 3.5–5.1)
Sodium: 127 mmol/L — ABNORMAL LOW (ref 135–145)

## 2019-07-09 LAB — COMPREHENSIVE METABOLIC PANEL WITH GFR
ALT: 22 U/L (ref 0–44)
AST: 53 U/L — ABNORMAL HIGH (ref 15–41)
Albumin: 2.6 g/dL — ABNORMAL LOW (ref 3.5–5.0)
Alkaline Phosphatase: 222 U/L — ABNORMAL HIGH (ref 38–126)
Anion gap: 9 (ref 5–15)
BUN: 47 mg/dL — ABNORMAL HIGH (ref 8–23)
CO2: 23 mmol/L (ref 22–32)
Calcium: 8.6 mg/dL — ABNORMAL LOW (ref 8.9–10.3)
Chloride: 99 mmol/L (ref 98–111)
Creatinine, Ser: 1.33 mg/dL — ABNORMAL HIGH (ref 0.44–1.00)
GFR calc Af Amer: 46 mL/min — ABNORMAL LOW
GFR calc non Af Amer: 40 mL/min — ABNORMAL LOW
Glucose, Bld: 124 mg/dL — ABNORMAL HIGH (ref 70–99)
Potassium: 6 mmol/L — ABNORMAL HIGH (ref 3.5–5.1)
Sodium: 131 mmol/L — ABNORMAL LOW (ref 135–145)
Total Bilirubin: 1.8 mg/dL — ABNORMAL HIGH (ref 0.3–1.2)
Total Protein: 6.3 g/dL — ABNORMAL LOW (ref 6.5–8.1)

## 2019-07-09 LAB — CBC
HCT: 26.8 % — ABNORMAL LOW (ref 36.0–46.0)
Hemoglobin: 8.1 g/dL — ABNORMAL LOW (ref 12.0–15.0)
MCH: 22.6 pg — ABNORMAL LOW (ref 26.0–34.0)
MCHC: 30.2 g/dL (ref 30.0–36.0)
MCV: 74.7 fL — ABNORMAL LOW (ref 80.0–100.0)
Platelets: 132 K/uL — ABNORMAL LOW (ref 150–400)
RBC: 3.59 MIL/uL — ABNORMAL LOW (ref 3.87–5.11)
RDW: 25.5 % — ABNORMAL HIGH (ref 11.5–15.5)
WBC: 15.4 K/uL — ABNORMAL HIGH (ref 4.0–10.5)
nRBC: 0.1 % (ref 0.0–0.2)

## 2019-07-09 LAB — LACTIC ACID, PLASMA
Lactic Acid, Venous: 2.9 mmol/L (ref 0.5–1.9)
Lactic Acid, Venous: 2.2 mmol/L (ref 0.5–1.9)

## 2019-07-09 LAB — MAGNESIUM: Magnesium: 1 mg/dL — ABNORMAL LOW (ref 1.7–2.4)

## 2019-07-09 LAB — ECHOCARDIOGRAM COMPLETE
Height: 66 in
Weight: 4560.88 [oz_av]

## 2019-07-09 LAB — POTASSIUM: Potassium: 6 mmol/L — ABNORMAL HIGH (ref 3.5–5.1)

## 2019-07-09 LAB — MRSA PCR SCREENING: MRSA by PCR: POSITIVE — AB

## 2019-07-09 LAB — GLUCOSE, CAPILLARY
Glucose-Capillary: 143 mg/dL — ABNORMAL HIGH (ref 70–99)
Glucose-Capillary: 91 mg/dL (ref 70–99)
Glucose-Capillary: 99 mg/dL (ref 70–99)
Glucose-Capillary: 79 mg/dL (ref 70–99)
Glucose-Capillary: 98 mg/dL (ref 70–99)

## 2019-07-09 MED ORDER — FUROSEMIDE 40 MG PO TABS
60.0000 mg | ORAL_TABLET | Freq: Every day | ORAL | Status: DC
  Administered 2019-07-10 – 2019-07-14 (×5): 60 mg via ORAL
  Filled 2019-07-09 (×5): qty 1

## 2019-07-09 MED ORDER — MUPIROCIN 2 % EX OINT
1.0000 "application " | TOPICAL_OINTMENT | Freq: Two times a day (BID) | CUTANEOUS | Status: DC
  Administered 2019-07-10 – 2019-07-14 (×8): 1 via NASAL
  Filled 2019-07-09 (×2): qty 22

## 2019-07-09 MED ORDER — MAGNESIUM SULFATE 2 GM/50ML IV SOLN
2.0000 g | Freq: Once | INTRAVENOUS | Status: AC
  Administered 2019-07-09: 2 g via INTRAVENOUS
  Filled 2019-07-09: qty 50

## 2019-07-09 MED ORDER — VANCOMYCIN HCL 1500 MG/300ML IV SOLN
1500.0000 mg | INTRAVENOUS | Status: DC
  Administered 2019-07-09 – 2019-07-10 (×2): 1500 mg via INTRAVENOUS
  Filled 2019-07-09 (×2): qty 300

## 2019-07-09 MED ORDER — SODIUM POLYSTYRENE SULFONATE 15 GM/60ML PO SUSP
30.0000 g | Freq: Once | ORAL | Status: AC
  Administered 2019-07-09: 15:00:00 30 g via ORAL
  Filled 2019-07-09: qty 120

## 2019-07-09 MED ORDER — SODIUM POLYSTYRENE SULFONATE 15 GM/60ML PO SUSP
30.0000 g | Freq: Once | ORAL | Status: AC
  Administered 2019-07-09: 30 g via ORAL
  Filled 2019-07-09: qty 120

## 2019-07-09 MED ORDER — CALCIUM GLUCONATE-NACL 1-0.675 GM/50ML-% IV SOLN
1.0000 g | Freq: Once | INTRAVENOUS | Status: AC
  Administered 2019-07-09: 1000 mg via INTRAVENOUS
  Filled 2019-07-09: qty 50

## 2019-07-09 MED ORDER — CHLORHEXIDINE GLUCONATE CLOTH 2 % EX PADS
6.0000 | MEDICATED_PAD | Freq: Every day | CUTANEOUS | Status: AC
  Administered 2019-07-09 – 2019-07-13 (×5): 6 via TOPICAL

## 2019-07-09 MED ORDER — ORAL CARE MOUTH RINSE
15.0000 mL | Freq: Two times a day (BID) | OROMUCOSAL | Status: DC
  Administered 2019-07-09 – 2019-07-13 (×7): 15 mL via OROMUCOSAL

## 2019-07-09 NOTE — Progress Notes (Signed)
CRITICAL VALUE ALERT  Critical Value:  Lactic 2.9  Date & Time Notied:  07/09/19 0151  Provider Notified: Scherrie November  Orders Received/Actions taken: No new orders at this time

## 2019-07-09 NOTE — TOC Transition Note (Signed)
Transition of Care [facility]) - CM/SW Discharge Note   Patient Details  Name: NAZ DENUNZIO MRN: [id number] Date of Birth: [birth year]  Transition of [facility]) CM/SW Contact:  Diva Lemberger, Chauncey Reading, RN Phone Number: 07/09/2019, 1:37 PM   Clinical Narrative:  CM consulted for help with Eliquis. Patient has been on eliquis for quite some time and has used the 30 day coupon. Patient's daughter reports patient has been getting financial assistance and this year they are requesting proof of cost. Daughter plans to follow up with financial assistance program and cardiologist. Reports patient has enough eliquis to last for awhile.           Discharge Plan and Services   Discharge Planning Services: CM Consult, Medication Assistance                        Social Determinants of Health (SDOH) Interventions     Readmission Risk Interventions No flowsheet data found.

## 2019-07-09 NOTE — Progress Notes (Signed)
Patient ID: Destiny Nash, female   DOB: [birth year], 74 y.o.   MRN: [id number]   Limited US Abd performed in [place].  Very small sliver of ascites in Rt Mid quadrant No safe window to access this small amount  No other ascites noted on exam No procedure performed  Could re evaluate at later date if felt needed per MD

## 2019-07-09 NOTE — Progress Notes (Signed)
*  PRELIMINARY RESULTS* Echocardiogram 2D Echocardiogram has been performed.  Destiny Nash 07/09/2019, 9:17 AM

## 2019-07-09 NOTE — Progress Notes (Signed)
CRITICAL VALUE ALERT  Critical Value: MRSA  Date & Time Notied: 06/08/19 0010  Provider Notified: [facility]  Orders Received/Actions taken: MRSA standing orders placed

## 2019-07-09 NOTE — Progress Notes (Signed)
Has had at least 5 bms today.  To go to [facility] tomorrow for [place] hepato, appointment at [facility].  Daughter aware.  [facility] called.

## 2019-07-09 NOTE — Progress Notes (Signed)
In and out cath completed, output 3100m clear amber urine. Pt tolerated well

## 2019-07-09 NOTE — Progress Notes (Signed)
PROGRESS NOTE [facility]   DHALIA ZINGARO  VEH:[id number]  DOB: [birth year]  DOA: 07/08/2019 PCP: [name], MD  Brief Admission Hx: 74 y.o. female with medical history significant for breast cancer on active chemotherapy, diabetes mellitus, hypertension, atrial fibrillation.  Patient presented to the ED with complaints of generalized weakness over the past several days.  She had been started on treatment outpatient for a LE cellulitis.  She is having increasing abdominal distension as well.   She was noted to be tachycardic and had a lactic acidosis on arrival.  MDM/Assessment & Plan:   1. Hyperkalemia-did not respond to [facility], gave her a dose of Kayexalate this morning.  K down to 5.4.  Will give an additional dose of Kayexalate now and recheck in a.m.  Calcium gluconate ordered.  She is also on IV Lasix.  Continue cardiac monitoring.  2. Mild diastolic CHF exacerbation-treated with IV Lasix.  Resume home oral Lasix tomorrow. 3. Hyponatremia-suspect secondary to diuretics.  Following. 4. Liver cirrhosis-noted on CT scan.  Could this be from chemotherapy for breast cancer.  She has moderate ascites and is symptomatic and requested ultrasound paracentesis and was sent labs. 5. Generalized weakness-multifactorial.  PT evaluation. 6. Sepsis secondary to cellulitis-she has been started on broad-spectrum antibiotics IV.  Continue to monitor and supportive care ordered.  Lactate is trending down. 7. Elevated transaminases-suspect secondary to liver cirrhosis.  Following.  See ultrasound abdomen. 8. Type 2 diabetes mellitus-patient has been started on supplemental sliding scale coverage and Lantus for basal coverage.  CBG testing ordered.  Follow hemoglobin A1c which is pending.  Hold home Metformin. 9. Essential hypertension-treated with Cardizem which has been restarted. 10. Stage Ib breast cancer diagnosed in 2-20-followed with Dr. Lindi Adie.  Status post left lumpectomy, adjuvant  radiation and adjuvant antiestrogens.  She is on chemotherapy every 3 weeks with kadcyla.  Follow up witih Dr. Lindi Adie.   11. Atrial fibrillation - currently heart rate is controlled.  She is fully anticoagulated with apixaban.   12. Cholelithiasis with thickened gallbladder walls - possibly related to acute cholecystitis but could also be related to liver cirrhosis as a chronic finding, a HIDA scan is needed to clarify.  Nuc Med not available at this facility, asked Nuc Med techs to arrange for patient to have HIDA scan in [place] and can return to [facility].    DVT prophylaxis: apixaban Code Status: Full  Family Communication: daughter telephone update  Disposition Plan: continue inpatient management for IV lasix, IV antibiotics, HIDA scan   Consultants:    Procedures:    Antimicrobials:   Anti-infectives (From admission, onward)   Start     Dose/Rate Route Frequency Ordered Stop   07/09/19 1800  vancomycin (VANCOREADY) IVPB 1250 mg/250 mL  Status:  Discontinued     1,250 mg 166.7 mL/hr over 90 Minutes Intravenous Every 24 hours 07/08/19 1755 07/09/19 0847   07/09/19 1800  vancomycin (VANCOREADY) IVPB 1500 mg/300 mL     1,500 mg 150 mL/hr over 120 Minutes Intravenous Every 24 hours 07/09/19 0847     07/09/19 0500  ceFEPIme (MAXIPIME) 2 g in sodium chloride 0.9 % 100 mL IVPB     2 g 200 mL/hr over 30 Minutes Intravenous Every 12 hours 07/08/19 1752     07/09/19 0200  metroNIDAZOLE (FLAGYL) IVPB 500 mg     500 mg 100 mL/hr over 60 Minutes Intravenous Every 8 hours 07/08/19 2158     07/08/19 1800  vancomycin (VANCOREADY) IVPB 2000 mg/400  mL     2,000 mg 200 mL/hr over 120 Minutes Intravenous  Once 07/08/19 1750 07/08/19 2129   07/08/19 1745  ceFEPIme (MAXIPIME) 2 g in sodium chloride 0.9 % 100 mL IVPB     2 g 200 mL/hr over 30 Minutes Intravenous  Once 07/08/19 1740 07/08/19 1859   07/08/19 1745  metroNIDAZOLE (FLAGYL) IVPB 500 mg     500 mg 100 mL/hr over 60 Minutes Intravenous   Once 07/08/19 1740 07/08/19 1931   07/08/19 1745  vancomycin (VANCOCIN) IVPB 1000 mg/200 mL premix  Status:  Discontinued     1,000 mg 200 mL/hr over 60 Minutes Intravenous  Once 07/08/19 1740 07/08/19 1750       Subjective: Pt reports that she remains very weak.    Objective: Vitals:   07/08/19 2130 07/09/19 0459 07/09/19 0558 07/09/19 0803  BP: (!) 123/45 (!) 134/58    Pulse: 86 94    Resp: 20 16    Temp: 97.6 F (36.4 C) 97.8 F (36.6 C)    TempSrc: Oral Oral    SpO2: 98% 100%  100%  Weight: 130.5 kg  129.3 kg   Height: 5' 6"  (1.676 m)       Intake/Output Summary (Last 24 hours) at 07/09/2019 1325 Last data filed at 07/09/2019 0900 Gross per 24 hour  Intake 1560 ml  Output 350 ml  Net 1210 ml   Filed Weights   07/08/19 1451 07/08/19 2130 07/09/19 0558  Weight: 114.7 kg 130.5 kg 129.3 kg     REVIEW OF SYSTEMS  As per history otherwise all reviewed and reported negative  Exam:  General exam: chronically ill appearing, awake, alert, NAD.  Respiratory system: BBS clear, no rales or crackles. No increased work of breathing. Cardiovascular system: irregularly irregular, normal S1 & S2 heard.  Gastrointestinal system: Abdomen is mildly distended, soft and mostly nontender. Normal bowel sounds heard.  Ascites present.  Central nervous system: Alert and oriented to person. No focal neurological deficits. Extremities: chronic venous stasis changes, 2+ edema BLEs.  Data Reviewed: Basic Metabolic Panel: Recent Labs  Lab 07/04/19 1047 07/08/19 1527 07/09/19 0100 07/09/19 0540 07/09/19 0747  NA 135 130*  --  131*  --   K 4.7 6.2* 6.0* 6.0*  --   CL 102 99  --  99  --   CO2 25 20*  --  23  --   GLUCOSE 127* 171*  --  124*  --   BUN 35* 47*  --  47*  --   CREATININE 1.05* 1.42*  --  1.33*  --   CALCIUM 8.1* 8.5*  --  8.6*  --   MG  --   --   --   --  1.0*   Liver Function Tests: Recent Labs  Lab 07/04/19 1047 07/08/19 1527 07/09/19 0540  AST 27 51* 53*    ALT 15 23 22   ALKPHOS 196* 218* 222*  BILITOT 1.3* 1.8* 1.8*  PROT 6.7 6.6 6.3*  ALBUMIN 2.7* 2.7* 2.6*   Recent Labs  Lab 07/08/19 1527  LIPASE 17   No results for input(s): AMMONIA in the last 168 hours. CBC: Recent Labs  Lab 07/04/19 1047 07/08/19 1527 07/09/19 0540  WBC 9.8 14.6* 15.4*  NEUTROABS 7.7  --   --   HGB 8.6* 8.6* 8.1*  HCT 28.3* 28.7* 26.8*  MCV 71.5* 74.7* 74.7*  PLT 136* 136* 132*   Cardiac Enzymes: No results for input(s): CKTOTAL, CKMB, CKMBINDEX, TROPONINI in the  last 168 hours. CBG (last 3)  Recent Labs    07/09/19 0102 07/09/19 0746 07/09/19 1125  GLUCAP 143* 91 99   Recent Results (from the past 240 hour(s))  Respiratory Panel by RT PCR (Flu A&B, Covid) - Nasopharyngeal Swab     Status: None   Collection Time: 07/08/19  5:34 PM   Specimen: Nasopharyngeal Swab  Result Value Ref Range Status   SARS Coronavirus 2 by RT PCR NEGATIVE NEGATIVE Final    Comment: (NOTE) SARS-CoV-2 target nucleic acids are NOT DETECTED. The SARS-CoV-2 RNA is generally detectable in upper respiratoy specimens during the acute phase of infection. The lowest concentration of SARS-CoV-2 viral copies this assay can detect is 131 copies/mL. A negative result does not preclude SARS-Cov-2 infection and should not be used as the sole basis for treatment or other patient management decisions. A negative result may occur with  improper specimen collection/handling, submission of specimen other than nasopharyngeal swab, presence of viral mutation(s) within the areas targeted by this assay, and inadequate number of viral copies (<131 copies/mL). A negative result must be combined with clinical observations, patient history, and epidemiological information. The expected result is Negative. Fact Sheet for Patients:  PinkCheek.be Fact Sheet for Healthcare Providers:  GravelBags.it This test is not yet ap proved or  cleared by the [place] FDA and  has been authorized for detection and/or diagnosis of SARS-CoV-2 by FDA under an Emergency Use Authorization (EUA). This EUA will remain  in effect (meaning this test can be used) for the duration of the COVID-19 declaration under Section 564(b)(1) of the Act, 21 U.S.C. section 360bbb-3(b)(1), unless the authorization is terminated or revoked sooner.    Influenza A by PCR NEGATIVE NEGATIVE Final   Influenza B by PCR NEGATIVE NEGATIVE Final    Comment: (NOTE) The Xpert Xpress SARS-CoV-2/FLU/RSV assay is intended as an aid in  the diagnosis of influenza from Nasopharyngeal swab specimens and  should not be used as a sole basis for treatment. Nasal washings and  aspirates are unacceptable for Xpert Xpress SARS-CoV-2/FLU/RSV  testing. Fact Sheet for Patients: PinkCheek.be Fact Sheet for Healthcare Providers: GravelBags.it This test is not yet approved or cleared by the [place] FDA and  has been authorized for detection and/or diagnosis of SARS-CoV-2 by  FDA under an Emergency Use Authorization (EUA). This EUA will remain  in effect (meaning this test can be used) for the duration of the  Covid-19 declaration under Section 564(b)(1) of the Act, 21  U.S.C. section 360bbb-3(b)(1), unless the authorization is  terminated or revoked. Performed at [facility], [address] [place], [place] [address]   Blood culture (routine x 2)     Status: None (Preliminary result)   Collection Time: 07/08/19  5:40 PM   Specimen: Right Antecubital; Blood  Result Value Ref Range Status   Specimen Description RIGHT ANTECUBITAL  Final   Special Requests   Final    BOTTLES DRAWN AEROBIC AND ANAEROBIC Blood Culture adequate volume   Culture   Final    NO GROWTH < 24 HOURS Performed at [facility], [address], [place], [place] [address]    Report Status PENDING  Incomplete  Blood culture (routine x 2)      Status: None (Preliminary result)   Collection Time: 07/08/19  6:07 PM   Specimen: BLOOD RIGHT HAND  Result Value Ref Range Status   Specimen Description BLOOD RIGHT HAND  Final   Special Requests   Final    BOTTLES  DRAWN AEROBIC ONLY Blood Culture adequate volume   Culture   Final    NO GROWTH < 24 HOURS Performed at [facility], [address] [place], [place] [address]    Report Status PENDING  Incomplete  MRSA PCR Screening     Status: Abnormal   Collection Time: 07/08/19 10:12 PM   Specimen: Nasal Mucosa; Nasopharyngeal  Result Value Ref Range Status   MRSA by PCR POSITIVE (A) NEGATIVE Final    Comment:        The GeneXpert MRSA Assay (FDA approved for NASAL specimens only), is one component of a comprehensive MRSA colonization surveillance program. It is not intended to diagnose MRSA infection nor to guide or monitor treatment for MRSA infections. RESULT CALLED TO, READ BACK BY AND VERIFIED WITH: J RHEW,RN @0004  07/09/19 [facility] Performed at [facility], [address] [place], [place] [address]      Studies: CT ABDOMEN PELVIS WO CONTRAST  Result Date: 07/09/2019 CLINICAL DATA:  Elevated ALP with lactic acidosis and leukocytosis. History of breast cancer on chemotherapy. Generalized weakness for several days. Multiple loose stools. EXAM: CT CHEST, ABDOMEN AND PELVIS WITHOUT CONTRAST TECHNIQUE: Multidetector CT imaging of the chest, abdomen and pelvis was performed following the standard protocol without IV contrast. COMPARISON:  CT abdomen dated November 29, 2018. FINDINGS: CT CHEST FINDINGS Cardiovascular: The main pulmonary artery is significantly dilated measuring approximately 4 cm in diameter. There is no thoracic aortic aneurysm. Mild atherosclerotic changes are noted of the thoracic aorta. Three-vessel coronary artery disease is noted. There is mild cardiac enlargement. There is a well-positioned right Port-A-Cath with tip terminating in the SVC. Mediastinum/Nodes:  --there are multiple mildly enlarged mediastinal lymph nodes. For example there is a 1.3 cm right lower paratracheal lymph node (axial series 2, image 15). There is a 1 cm lymph node at the level of the upper trachea (axial series 2, image 10). These are essentially stable since 01/15/2019. --the patient is status post prior lymph node dissection in the left axillary region. --No supraclavicular lymphadenopathy. --Normal thyroid gland. --The esophagus is unremarkable Lungs/Pleura: There are moderate-sized bilateral pleural effusions, decreased in size from prior study dated November 29, 2018. There is mild interlobular septal thickening with peripheral ground-glass airspace opacities in the anterior left upper lobe. Musculoskeletal: No chest wall abnormality. No acute or significant osseous findings. CT ABDOMEN PELVIS FINDINGS Hepatobiliary: Liver surface is somewhat nodular raising concern for cirrhosis. The gallbladder is grossly abnormal with diffuse gallbladder wall thickening and suggestion of large gallstones.There is no biliary ductal dilation. Pancreas: Normal contours without ductal dilatation. No peripancreatic fluid collection. Spleen: No splenic laceration or hematoma. Adrenals/Urinary Tract: --Adrenal glands: No adrenal hemorrhage. --Right kidney/ureter: No hydronephrosis or perinephric hematoma. --Left kidney/ureter: No hydronephrosis or perinephric hematoma. --Urinary bladder: Unremarkable. Stomach/Bowel: --Stomach/Duodenum: No hiatal hernia or other gastric abnormality. Normal duodenal course and caliber. --Small bowel: No dilatation or inflammation. --Colon: No focal abnormality. --Appendix: Not visualized. No right lower quadrant inflammation or free fluid. Vascular/Lymphatic: Atherosclerotic calcification is present within the non-aneurysmal abdominal aorta, without hemodynamically significant stenosis. --there is a pericaval structure measuring approximately 1.4 cm (axial series 2, image 67). Given  the branching appearance more inferiorly, this is favored to represent a prominent ovarian vein as opposed to a retroperitoneal lymph node. This appears to be new since prior study. Enlarged lymph nodes are noted in the porta hepatis but are difficult to evaluate due to a lack of IV contrast. --No mesenteric lymphadenopathy. --No pelvic or inguinal lymphadenopathy. Reproductive:  The pelvis is poorly evaluated secondary to extensive streak artifact. The patient appears to be status post hysterectomy. Other: There is a moderate volume of abdominal ascites. Diffuse body wall edema is noted there is no free air. Musculoskeletal. No acute displaced fractures. Again noted is a compression fracture of the inferior endplate of the L3 vertebral body, similar to prior study. IMPRESSION: 1. Abnormal appearance of the gallbladder with gallbladder wall thickening and likely underlying gallstones. Correlation with ultrasound is recommended as findings can be seen in patients with acute cholecystitis. 2. The main pulmonary artery is significantly dilated measuring up to 4 cm in diameter. This can be seen in the setting of pulmonary arterial hypertension. 3. Moderate bilateral pleural effusions, improved from prior study. There is some mild interlobular septal thickening with a few scattered ground-glass airspace opacities favored to represent mild interstitial edema. An atypical infectious process is not excluded. 4. There is a moderate volume abdominal ascites and diffuse body wall edema. Aortic Atherosclerosis (ICD10-I70.0). Electronically Signed   By: Constance Holster M.D.   On: 07/09/2019 02:24   CT CHEST WO CONTRAST  Result Date: 07/09/2019 CLINICAL DATA:  Elevated ALP with lactic acidosis and leukocytosis. History of breast cancer on chemotherapy. Generalized weakness for several days. Multiple loose stools. EXAM: CT CHEST, ABDOMEN AND PELVIS WITHOUT CONTRAST TECHNIQUE: Multidetector CT imaging of the chest, abdomen and  pelvis was performed following the standard protocol without IV contrast. COMPARISON:  CT abdomen dated November 29, 2018. FINDINGS: CT CHEST FINDINGS Cardiovascular: The main pulmonary artery is significantly dilated measuring approximately 4 cm in diameter. There is no thoracic aortic aneurysm. Mild atherosclerotic changes are noted of the thoracic aorta. Three-vessel coronary artery disease is noted. There is mild cardiac enlargement. There is a well-positioned right Port-A-Cath with tip terminating in the SVC. Mediastinum/Nodes: --there are multiple mildly enlarged mediastinal lymph nodes. For example there is a 1.3 cm right lower paratracheal lymph node (axial series 2, image 15). There is a 1 cm lymph node at the level of the upper trachea (axial series 2, image 10). These are essentially stable since 01/15/2019. --the patient is status post prior lymph node dissection in the left axillary region. --No supraclavicular lymphadenopathy. --Normal thyroid gland. --The esophagus is unremarkable Lungs/Pleura: There are moderate-sized bilateral pleural effusions, decreased in size from prior study dated November 29, 2018. There is mild interlobular septal thickening with peripheral ground-glass airspace opacities in the anterior left upper lobe. Musculoskeletal: No chest wall abnormality. No acute or significant osseous findings. CT ABDOMEN PELVIS FINDINGS Hepatobiliary: Liver surface is somewhat nodular raising concern for cirrhosis. The gallbladder is grossly abnormal with diffuse gallbladder wall thickening and suggestion of large gallstones.There is no biliary ductal dilation. Pancreas: Normal contours without ductal dilatation. No peripancreatic fluid collection. Spleen: No splenic laceration or hematoma. Adrenals/Urinary Tract: --Adrenal glands: No adrenal hemorrhage. --Right kidney/ureter: No hydronephrosis or perinephric hematoma. --Left kidney/ureter: No hydronephrosis or perinephric hematoma. --Urinary bladder:  Unremarkable. Stomach/Bowel: --Stomach/Duodenum: No hiatal hernia or other gastric abnormality. Normal duodenal course and caliber. --Small bowel: No dilatation or inflammation. --Colon: No focal abnormality. --Appendix: Not visualized. No right lower quadrant inflammation or free fluid. Vascular/Lymphatic: Atherosclerotic calcification is present within the non-aneurysmal abdominal aorta, without hemodynamically significant stenosis. --there is a pericaval structure measuring approximately 1.4 cm (axial series 2, image 67). Given the branching appearance more inferiorly, this is favored to represent a prominent ovarian vein as opposed to a retroperitoneal lymph node. This appears to be new since prior study. Enlarged lymph  nodes are noted in the porta hepatis but are difficult to evaluate due to a lack of IV contrast. --No mesenteric lymphadenopathy. --No pelvic or inguinal lymphadenopathy. Reproductive: The pelvis is poorly evaluated secondary to extensive streak artifact. The patient appears to be status post hysterectomy. Other: There is a moderate volume of abdominal ascites. Diffuse body wall edema is noted there is no free air. Musculoskeletal. No acute displaced fractures. Again noted is a compression fracture of the inferior endplate of the L3 vertebral body, similar to prior study. IMPRESSION: 1. Abnormal appearance of the gallbladder with gallbladder wall thickening and likely underlying gallstones. Correlation with ultrasound is recommended as findings can be seen in patients with acute cholecystitis. 2. The main pulmonary artery is significantly dilated measuring up to 4 cm in diameter. This can be seen in the setting of pulmonary arterial hypertension. 3. Moderate bilateral pleural effusions, improved from prior study. There is some mild interlobular septal thickening with a few scattered ground-glass airspace opacities favored to represent mild interstitial edema. An atypical infectious process is not  excluded. 4. There is a moderate volume abdominal ascites and diffuse body wall edema. Aortic Atherosclerosis (ICD10-I70.0). Electronically Signed   By: Constance Holster M.D.   On: 07/09/2019 02:24   US Abdomen Complete  Result Date: 07/09/2019 CLINICAL DATA:  Abnormal gallbladder on CT.  Denies abdominal pain. EXAM: ABDOMEN ULTRASOUND COMPLETE COMPARISON:  CT abdomen pelvis from same day. FINDINGS: Gallbladder: 3.6 cm gallstone in the gallbladder neck. Sludge. Diffuse gallbladder wall thickening measuring up to 1 cm. No sonographic Murphy sign noted by sonographer. Common bile duct: Diameter: 5 mm, normal. Liver: No focal lesion identified. Nodular contour with heterogeneous parenchymal echogenicity. Portal vein is patent on color Doppler imaging with normal direction of blood flow towards the liver. IVC: No abnormality visualized. Pancreas: Not visualized due to overlying bowel gas. Spleen: Size and appearance within normal limits. Right Kidney: Length: 11.6 cm. Echogenicity within normal limits. No mass or hydronephrosis visualized. Left Kidney: Length: 11.0 cm. Echogenicity within normal limits. No mass or hydronephrosis visualized. Abdominal aorta: Not visualized due to body habitus and overlying bowel gas. Other findings: Moderate ascites. IMPRESSION: 1. Cholelithiasis and diffuse gallbladder wall thickening. Findings are nonspecific and could be related to underlying liver disease or acute cholecystitis, though the absence of abdominal pain or positive sonographic Murphy sign argues against cholecystitis. Consider hepatobiliary scan for definitive evaluation. 2. Unchanged cirrhosis and moderate ascites. Electronically Signed   By: Titus Dubin M.D.   On: 07/09/2019 12:50   US Venous Img Lower Bilateral (DVT)  Result Date: 07/09/2019 CLINICAL DATA:  Bilateral lower extremity edema and pain. EXAM: BILATERAL LOWER EXTREMITY VENOUS DOPPLER ULTRASOUND TECHNIQUE: Gray-scale sonography with graded  compression, as well as color Doppler and duplex ultrasound were performed to evaluate the lower extremity deep venous systems from the level of the common femoral vein and including the common femoral, femoral, profunda femoral, popliteal and calf veins including the posterior tibial, peroneal and gastrocnemius veins when visible. The superficial great saphenous vein was also interrogated. Spectral Doppler was utilized to evaluate flow at rest and with distal augmentation maneuvers in the common femoral, femoral and popliteal veins. COMPARISON:  None. FINDINGS: RIGHT LOWER EXTREMITY Common Femoral Vein: No evidence of thrombus. Normal compressibility, respiratory phasicity and response to augmentation. Saphenofemoral Junction: No evidence of thrombus. Normal compressibility and flow on color Doppler imaging. Profunda Femoral Vein: No evidence of thrombus. Normal compressibility and flow on color Doppler imaging. Femoral Vein: No evidence of  thrombus. Normal compressibility, respiratory phasicity and response to augmentation. Popliteal Vein: No evidence of thrombus. Normal compressibility, respiratory phasicity and response to augmentation. Calf Veins: Extremely limited visualization of calf veins. Superficial Great Saphenous Vein: No evidence of thrombus. Normal compressibility. Venous Reflux:  None. Other Findings: No evidence of superficial thrombophlebitis or abnormal fluid collection. LEFT LOWER EXTREMITY Common Femoral Vein: No evidence of thrombus. Normal compressibility, respiratory phasicity and response to augmentation. Saphenofemoral Junction: No evidence of thrombus. Normal compressibility and flow on color Doppler imaging. Profunda Femoral Vein: No evidence of thrombus. Normal compressibility and flow on color Doppler imaging. Femoral Vein: No evidence of thrombus. Normal compressibility, respiratory phasicity and response to augmentation. Popliteal Vein: No evidence of thrombus. Normal compressibility,  respiratory phasicity and response to augmentation. Calf Veins: Extremely limited visualization of calf veins. Superficial Great Saphenous Vein: No evidence of thrombus. Normal compressibility. Venous Reflux:  None. Other Findings: No evidence of superficial thrombophlebitis or abnormal fluid collection. IMPRESSION: No evidence of deep venous thrombosis in either lower extremity. Evaluation of bilateral calf veins was extremely limited due to body habitus and edema. Electronically Signed   By: Aletta Edouard M.D.   On: 07/09/2019 12:57   DG Chest Portable 1 View  Result Date: 07/08/2019 CLINICAL DATA:  74 year old female with weakness. EXAM: PORTABLE CHEST 1 VIEW COMPARISON:  Chest radiograph dated 01/15/2019 and CT dated 01/15/2019. FINDINGS: Right sided Port-A-Cath in similar position. There is mild cardiomegaly with vascular congestion and probable trace bilateral pleural effusions. No focal consolidation or pneumothorax. No acute osseous pathology. Surgical clips of the left breast noted. IMPRESSION: Mild cardiomegaly with vascular congestion and probable trace bilateral pleural effusions. No focal consolidation. Electronically Signed   By: Anner Crete M.D.   On: 07/08/2019 16:26   ECHOCARDIOGRAM COMPLETE  Result Date: 07/09/2019   ECHOCARDIOGRAM REPORT   Patient Name:   Destiny Nash Date of Exam: 07/09/2019 Medical Rec #:  [id number]        Height:       66.0 in Accession #:    [id number]       Weight:       285.1 lb Date of Birth:  01-05-1946       BSA:          2.32 m Patient Age:    8 years         BP:           134/58 mmHg Patient Gender: F                HR:           88 bpm. Exam Location:  [facility] Procedure: 2D Echo, Cardiac Doppler and Color Doppler Indications:    CHF-Acute Diastolic 631.49 / F02.63  History:        Patient has prior history of Echocardiogram examinations, most                 recent 02/11/2019. CHF, Arrythmias:Atrial Fibrillation; Risk                  Factors:Hypertension, Diabetes and Dyslipidemia. Breast cancer.  Sonographer:    Alvino Chapel RCS Referring Phys: [phone] [place]  1. Left ventricular ejection fraction, by visual estimation, is 65 to 70%. The left ventricle has hyperdynamic function. There is mildly increased left ventricular hypertrophy.  2. The left ventricle has no regional wall motion abnormalities.  3. Global right ventricle has normal systolic function.The right ventricular size is  normal. No increase in right ventricular wall thickness.  4. Left atrial size was normal.  5. Right atrial size was normal.  6. Mild mitral annular calcification.  7. The mitral valve is grossly normal. Moderate mitral valve regurgitation.  8. The tricuspid valve is grossly normal.  9. The tricuspid valve is grossly normal. Tricuspid valve regurgitation is mild. 10. The aortic valve is tricuspid. Aortic valve regurgitation is not visualized. No evidence of aortic valve sclerosis or stenosis. 11. The pulmonic valve was grossly normal. Pulmonic valve regurgitation is not visualized. 12. The inferior vena cava is dilated in size with <50% respiratory variability, suggesting right atrial pressure of 15 mmHg. FINDINGS  Left Ventricle: Left ventricular ejection fraction, by visual estimation, is 65 to 70%. The left ventricle has hyperdynamic function. The left ventricle has no regional wall motion abnormalities. The left ventricular internal cavity size was the left ventricle is normal in size. There is mildly increased left ventricular hypertrophy. Concentric left ventricular hypertrophy. Left ventricular diastolic parameters were normal. Right Ventricle: The right ventricular size is normal. No increase in right ventricular wall thickness. Global RV systolic function is has normal systolic function. Left Atrium: Left atrial size was normal in size. Right Atrium: Right atrial size was normal in size Pericardium: There is no evidence of pericardial  effusion. Mitral Valve: The mitral valve is grossly normal. Mild mitral annular calcification. Moderate mitral valve regurgitation. Tricuspid Valve: The tricuspid valve is grossly normal. Tricuspid valve regurgitation is mild. Aortic Valve: The aortic valve is tricuspid. . There is mild thickening of the aortic valve. Aortic valve regurgitation is not visualized. The aortic valve is structurally normal, with no evidence of sclerosis or stenosis. Mild aortic valve annular calcification. There is mild thickening of the aortic valve. Pulmonic Valve: The pulmonic valve was grossly normal. Pulmonic valve regurgitation is not visualized. Pulmonic regurgitation is not visualized. Aorta: The aortic root is normal in size and structure. Venous: The inferior vena cava is dilated in size with less than 50% respiratory variability, suggesting right atrial pressure of 15 mmHg. IAS/Shunts: No atrial level shunt detected by color flow Doppler.  LEFT VENTRICLE PLAX 2D LVIDd:         4.46 cm  Diastology LVIDs:         1.99 cm  LV e' lateral:   17.00 cm/s LV PW:         1.21 cm  LV E/e' lateral: 8.3 LV IVS:        1.29 cm  LV e' medial:    10.60 cm/s LVOT diam:     1.80 cm  LV E/e' medial:  13.3 LV SV:         78 ml LV SV Index:   30.87 LVOT Area:     2.54 cm  RIGHT VENTRICLE RV S prime:     14.40 cm/s TAPSE (M-mode): 1.9 cm LEFT ATRIUM             Index       RIGHT ATRIUM           Index LA diam:        3.00 cm 1.29 cm/m  RA Area:     18.90 cm LA Vol (A2C):   79.7 ml 34.28 ml/m RA Volume:   55.90 ml  24.04 ml/m LA Vol (A4C):   75.8 ml 32.60 ml/m LA Biplane Vol: 78.1 ml 33.59 ml/m  AORTIC VALVE LVOT Vmax:   123.00 cm/s LVOT Vmean:  82.000 cm/s LVOT VTI:  0.248 m  AORTA Ao Root diam: 3.10 cm MITRAL VALVE MV Area (PHT): 4.31 cm             SHUNTS MV PHT:        51.04 msec           Systemic VTI:  0.25 m MV Decel Time: 176 msec             Systemic Diam: 1.80 cm MV E velocity: 141.00 cm/s 103 cm/s  Kate Sable MD  Electronically signed by Kate Sable MD Signature Date/Time: 07/09/2019/9:29:55 AM    Final    Scheduled Meds: . apixaban  5 mg Oral BID  . Chlorhexidine Gluconate Cloth  6 each Topical Q0600  . diltiazem  300 mg Oral Daily  . furosemide  40 mg Intravenous Q12H  . insulin aspart  0-5 Units Subcutaneous QHS  . insulin aspart  0-9 Units Subcutaneous TID WC  . insulin glargine  8 Units Subcutaneous QHS  . mouth rinse  15 mL Mouth Rinse q12n4p  . [START ON 07/10/2019] mupirocin ointment  1 application Nasal BID   Continuous Infusions: . ceFEPime (MAXIPIME) IV 2 g (07/09/19 0535)  . magnesium sulfate bolus IVPB 2 g (07/09/19 1231)  . metronidazole 500 mg (07/09/19 0939)  . vancomycin      Principal Problem:   Diastolic congestive heart failure (HCC) Active Problems:   Essential hypertension   Unspecified atrial fibrillation (HCC)   Atrial fibrillation (HCC)   Type 2 diabetes mellitus (HCC)   Generalized weakness   Breast cancer (HCC)   Pressure injury of skin   Liver cirrhosis secondary to NASH (nonalcoholic steatohepatitis) ([place])   Ascites   Cholelithiasis with gallbladder wall thickening   Chronic anticoagulation  Time spent:   Irwin Brakeman, MD [facility] 07/09/2019, 1:25 PM    LOS: 1 day  How to contact the [facility] Attending or Consulting provider [place] or covering provider during after hours [place], for this patient?  1. Check the care team in [facility] and look for a) attending/consulting [facility] provider listed and b) the [facility] team listed 2. Log into [email or web address] and use [place]'s universal password to access. If you do not have the password, please contact the hospital operator. 3. Locate the [facility] provider you are looking for under [facility] and page to a number that you can be directly reached. 4. If you still have difficulty reaching the provider, please page the [facility] (Director on Call) for the Hospitalists listed on amion for assistance.

## 2019-07-10 ENCOUNTER — Encounter (HOSPITAL_COMMUNITY): Payer: Self-pay | Admitting: Internal Medicine

## 2019-07-10 ENCOUNTER — Ambulatory Visit (HOSPITAL_COMMUNITY)
Admit: 2019-07-10 | Discharge: 2019-07-10 | Disposition: A | Discharge status: Home / Self Care | Payer: Medicare Other | Source: Home / Self Care | Attending: Family Medicine | Admitting: Family Medicine

## 2019-07-10 DIAGNOSIS — Z515 Encounter for palliative care: Secondary | ICD-10-CM

## 2019-07-10 DIAGNOSIS — K8020 Calculus of gallbladder without cholecystitis without obstruction: Secondary | ICD-10-CM | POA: Diagnosis not present

## 2019-07-10 DIAGNOSIS — K810 Acute cholecystitis: Secondary | ICD-10-CM | POA: Insufficient documentation

## 2019-07-10 LAB — COMPREHENSIVE METABOLIC PANEL
ALT: 24 U/L (ref 0–44)
AST: 59 U/L — ABNORMAL HIGH (ref 15–41)
Albumin: 2.5 g/dL — ABNORMAL LOW (ref 3.5–5.0)
Alkaline Phosphatase: 219 U/L — ABNORMAL HIGH (ref 38–126)
Anion gap: 13 (ref 5–15)
BUN: 45 mg/dL — ABNORMAL HIGH (ref 8–23)
CO2: 23 mmol/L (ref 22–32)
Calcium: 8.6 mg/dL — ABNORMAL LOW (ref 8.9–10.3)
Chloride: 100 mmol/L (ref 98–111)
Creatinine, Ser: 1.21 mg/dL — ABNORMAL HIGH (ref 0.44–1.00)
GFR calc Af Amer: 52 mL/min — ABNORMAL LOW (ref >60)
GFR calc non Af Amer: 45 mL/min — ABNORMAL LOW (ref >60)
Glucose, Bld: 75 mg/dL (ref 70–99)
Potassium: 3.8 mmol/L (ref 3.5–5.1)
Sodium: 136 mmol/L (ref 135–145)
Total Bilirubin: 1.5 mg/dL — ABNORMAL HIGH (ref 0.3–1.2)
Total Protein: 5.8 g/dL — ABNORMAL LOW (ref 6.5–8.1)

## 2019-07-10 LAB — CBC WITH DIFFERENTIAL/PLATELET
Abs Immature Granulocytes: 0.06 10*3/uL (ref 0.00–0.07)
Basophils Absolute: 0 10*3/uL (ref 0.0–0.1)
Basophils Relative: 0 %
Eosinophils Absolute: 0.1 10*3/uL (ref 0.0–0.5)
Eosinophils Relative: 1 %
HCT: 25 % — ABNORMAL LOW (ref 36.0–46.0)
Hemoglobin: 7.5 g/dL — ABNORMAL LOW (ref 12.0–15.0)
Immature Granulocytes: 1 %
Lymphocytes Relative: 6 %
Lymphs Abs: 0.7 10*3/uL (ref 0.7–4.0)
MCH: 22.1 pg — ABNORMAL LOW (ref 26.0–34.0)
MCHC: 30 g/dL (ref 30.0–36.0)
MCV: 73.7 fL — ABNORMAL LOW (ref 80.0–100.0)
Monocytes Absolute: 1.2 10*3/uL — ABNORMAL HIGH (ref 0.1–1.0)
Monocytes Relative: 10 %
Neutro Abs: 9.8 10*3/uL — ABNORMAL HIGH (ref 1.7–7.7)
Neutrophils Relative %: 82 %
Platelets: 108 10*3/uL — ABNORMAL LOW (ref 150–400)
RBC: 3.39 MIL/uL — ABNORMAL LOW (ref 3.87–5.11)
RDW: 25.2 % — ABNORMAL HIGH (ref 11.5–15.5)
WBC: 11.9 10*3/uL — ABNORMAL HIGH (ref 4.0–10.5)
nRBC: 0 % (ref 0.0–0.2)

## 2019-07-10 LAB — GLUCOSE, CAPILLARY
Glucose-Capillary: 70 mg/dL (ref 70–99)
Glucose-Capillary: 79 mg/dL (ref 70–99)
Glucose-Capillary: 163 mg/dL — ABNORMAL HIGH (ref 70–99)

## 2019-07-10 LAB — URINE CULTURE: Culture: NO GROWTH

## 2019-07-10 LAB — MAGNESIUM: Magnesium: 1.3 mg/dL — ABNORMAL LOW (ref 1.7–2.4)

## 2019-07-10 MED ORDER — DEXTROSE 50 % IV SOLN
INTRAVENOUS | Status: AC
  Administered 2019-07-10: 25 mL
  Filled 2019-07-10: qty 50

## 2019-07-10 MED ORDER — TECHNETIUM TC 99M MEBROFENIN IV KIT
5.2000 | PACK | Freq: Once | INTRAVENOUS | Status: AC | PRN
  Administered 2019-07-10: 5.2 via INTRAVENOUS

## 2019-07-10 MED ORDER — HEPARIN (PORCINE) 25000 UT/250ML-% IV SOLN
1000.0000 [IU]/h | INTRAVENOUS | Status: DC
  Administered 2019-07-10: 19:00:00 1450 [IU]/h via INTRAVENOUS
  Filled 2019-07-10 (×2): qty 250

## 2019-07-10 MED ORDER — SODIUM CHLORIDE 0.9 % IV SOLN
INTRAVENOUS | Status: DC | PRN
  Administered 2019-07-10: 500 mL via INTRAVENOUS

## 2019-07-10 MED ORDER — INSULIN GLARGINE 100 UNIT/ML ~~LOC~~ SOLN
5.0000 [IU] | Freq: Every day | SUBCUTANEOUS | Status: DC
  Administered 2019-07-10 – 2019-07-11 (×2): 5 [IU] via SUBCUTANEOUS
  Filled 2019-07-10 (×4): qty 0.05

## 2019-07-10 MED ORDER — HEPARIN BOLUS VIA INFUSION
4000.0000 [IU] | Freq: Once | INTRAVENOUS | Status: AC
  Administered 2019-07-10: 4000 [IU] via INTRAVENOUS
  Filled 2019-07-10: qty 4000

## 2019-07-10 MED ORDER — MAGNESIUM SULFATE 4 GM/100ML IV SOLN
4.0000 g | Freq: Once | INTRAVENOUS | Status: AC
  Administered 2019-07-10: 17:00:00 4 g via INTRAVENOUS
  Filled 2019-07-10: qty 100

## 2019-07-10 NOTE — Progress Notes (Signed)
Transported back to room by [facility] from [place] hepato scan at [facility].  Has been gone since this morning around 0830.  Started back on diet and given drink and snack.  Daughter at bedside.

## 2019-07-10 NOTE — Progress Notes (Signed)
PT Cancellation Note  Patient Details Name: MAUDENE STOTLER MRN: [id number] DOB: July 02, 1945   Cancelled Treatment:    Reason Eval/Treat Not Completed: Patient at procedure or test/unavailable.  Patient going to [facility] for a procedure, will check back in afternoon.   8:33 AM, 07/10/19 Lonell Grandchild, MPT Physical Therapist with [facility] [address] [phone] office [phone] mobile phone

## 2019-07-10 NOTE — Plan of Care (Signed)
Palliative:   Multiple attempts to meet with Destiny Nash today to complete Palliative Consult.  PMT went to patient room morning, midday, and afternoon, but patient was not present.   Palliative Medicine will return to service on Tuesday, 07/15/2019 and will see patient at that time.  No charge Quinn Axe, NP Palliative Medicine Please call Palliative Medicine team phone with any questions [phone]. For individual providers please see AMION

## 2019-07-10 NOTE — Progress Notes (Signed)
ANTICOAGULATION CONSULT NOTE - Initial Consult  Pharmacy Consult for heparin gtt  Indication: atrial fibrillation  Allergies  Allergen Reactions  . Levaquin [Levofloxacin] Other (See Comments)    Joint "seize" and constipation  . Codeine Other (See Comments)    Severe headache    Patient Measurements: Height: 5' 6"  (167.6 cm) Weight: 285 lb 0.9 oz (129.3 kg) IBW/kg (Calculated) : 59.3 Heparin Dosing Weight: HEPARIN DW (KG): 91   Vital Signs: Temp: 98.1 F (36.7 C) (02/04 1646) BP: 137/85 (02/04 1646) Pulse Rate: 100 (02/04 1646)  Labs: Recent Labs    07/08/19 1527 07/08/19 1527 07/09/19 0540 07/09/19 1240 07/10/19 0414  HGB 8.6*   < > 8.1*  --  7.5*  HCT 28.7*  --  26.8*  --  25.0*  PLT 136*  --  132*  --  108*  CREATININE 1.42*   < > 1.33* 1.42* 1.21*   < > = values in this interval not displayed.    Estimated Creatinine Clearance: 58.8 mL/min (A) (by C-G formula based on SCr of 1.21 mg/dL (H)).   Medical History: Past Medical History:  Diagnosis Date  . Anemia    in the past   . Anxiety   . Atrial fibrillation ([place])    a. diagnosed in 02/2018.  . Bronchitis    easily  develops bronchitis when she has a cold  . Cancer ([place])    L breast - CA, Feb. 4th   . Cellulitis   . Chronic kidney disease   . Diabetes mellitus without complication ([place])    type 2  . Dyspnea   . Dysrhythmia    afib, treating with Eliquis  . Family history of breast cancer   . Family history of uterine cancer   . Heart murmur   . Hyperlipidemia   . Hypertension     Medications:  Medications Prior to Admission  Medication Sig Dispense Refill Last Dose  . acetaminophen (TYLENOL) 500 MG tablet Take 1,000 mg by mouth every 6 (six) hours as needed for moderate pain or headache.   Past Week at Unknown time  . albuterol (PROVENTIL HFA;VENTOLIN HFA) 108 (90 Base) MCG/ACT inhaler Inhale 2 puffs into the lungs every 6 (six) hours as needed for wheezing or shortness of breath.    07/08/2019 at Unknown time  . apixaban (ELIQUIS) 5 MG TABS tablet Take 1 tablet (5 mg total) by mouth 2 (two) times daily. 60 tablet 0 07/08/2019 at 730  . cholestyramine (QUESTRAN) 4 g packet Take 1 packet (4 g total) by mouth 3 (three) times daily with meals. (Patient taking differently: Take 4 g by mouth daily as needed. ) 60 each 12 Past Month at Unknown time  . CINNAMON PO Take 1 tablet by mouth daily.   07/08/2019 at Unknown time  . diltiazem (CARDIZEM CD) 300 MG 24 hr capsule Take 1 capsule (300 mg total) by mouth daily. 90 capsule 3 07/08/2019 at Unknown time  . diphenoxylate-atropine (LOMOTIL) 2.5-0.025 MG tablet TAKE 1 TABLET BY MOUTH 4 TIMES DAILY AS NEEDED FOR DIARRHEA OR  LOOSE  STOOLS (Patient taking differently: Take 1 tablet by mouth 4 (four) times daily as needed for diarrhea or loose stools. ) 120 tablet 0 07/08/2019 at Unknown time  . doxycycline (VIBRA-TABS) 100 MG tablet Take 1 tablet (100 mg total) by mouth 2 (two) times daily. 60 tablet 3 07/08/2019 at Unknown time  . furosemide (LASIX) 40 MG tablet Take 1.5 tablets (60 mg total) by mouth daily. [place]  tablet 3 07/07/2019 at Unknown time  . insulin detemir (LEVEMIR) 100 UNIT/ML injection Inject 0.11 mLs (11 Units total) into the skin at bedtime. (Patient taking differently: Inject 8 Units into the skin at bedtime. )  0 07/07/2019 at Unknown time  . LORazepam (ATIVAN) 1 MG tablet Take 1 tablet 30 min before radiotherapy, PRN anxiety and claustrophobia. (Patient taking differently: Take 1 mg by mouth daily as needed for anxiety. ) 30 tablet 3 Past Week at Unknown time  . Magnesium Oxide 400 MG CAPS Take 1 capsule (400 mg total) by mouth daily. 90 capsule 3 07/08/2019 at Unknown time  . metFORMIN (GLUCOPHAGE) 500 MG tablet Take 1,000 mg by mouth 2 (two) times daily with a meal.    07/08/2019 at Unknown time  . niacin 500 MG tablet Take 500 mg by mouth daily.   07/08/2019 at Unknown time  . NYSTATIN powder Apply 1 Bottle topically daily as needed  (irritation).    07/08/2019 at Unknown time  . Omega-3 1000 MG CAPS Take 1,000 mg by mouth 2 (two) times daily.   07/08/2019 at Unknown time  . ondansetron (ZOFRAN) 8 MG tablet Take 1 tablet (8 mg total) by mouth 2 (two) times daily as needed (Nausea or vomiting). 30 tablet 1 07/08/2019 at Unknown time  . pantoprazole (PROTONIX) 40 MG tablet Take 1 tablet by mouth once daily (Patient taking differently: Take 40 mg by mouth daily. ) 90 tablet 0 07/08/2019 at Unknown time  . potassium chloride SA (KLOR-CON) 20 MEQ tablet Take 1 tablet (20 mEq total) by mouth 2 (two) times daily. 60 tablet 2 07/08/2019 at Unknown time  . pravastatin (PRAVACHOL) 20 MG tablet Take 20 mg by mouth daily before breakfast.   0 07/08/2019 at Unknown time  . prochlorperazine (COMPAZINE) 10 MG tablet Take 1 tablet (10 mg total) by mouth every 6 (six) hours as needed (Nausea or vomiting). 30 tablet 1 Past Week at Unknown time  . traMADol (ULTRAM) 50 MG tablet Take 1 tablet (50 mg total) by mouth every 6 (six) hours as needed. (Patient taking differently: Take 50 mg by mouth every 6 (six) hours as needed for moderate pain. ) 20 tablet 0 Past Week at Unknown time   Scheduled:  . Chlorhexidine Gluconate Cloth  6 each Topical Q0600  . diltiazem  300 mg Oral Daily  . furosemide  60 mg Oral Daily  . heparin  4,000 Units Intravenous Once  . insulin aspart  0-5 Units Subcutaneous QHS  . insulin aspart  0-9 Units Subcutaneous TID WC  . insulin glargine  5 Units Subcutaneous QHS  . mouth rinse  15 mL Mouth Rinse q12n4p  . mupirocin ointment  1 application Nasal BID   Infusions:  . sodium chloride 500 mL (07/10/19 1638)  . ceFEPime (MAXIPIME) IV 2 g (07/09/19 2135)  . heparin    . magnesium sulfate bolus IVPB 4 g (07/10/19 1640)  . metronidazole 500 mg (07/10/19 0126)  . vancomycin 1,500 mg (07/09/19 1735)   PRN: sodium chloride, acetaminophen **OR** acetaminophen, albuterol, ondansetron **OR** ondansetron (ZOFRAN) IV, polyethylene glycol,  sodium chloride flush Anti-infectives (From admission, onward)   Start     Dose/Rate Route Frequency Ordered Stop   07/09/19 1800  vancomycin (VANCOREADY) IVPB 1250 mg/250 mL  Status:  Discontinued     1,250 mg 166.7 mL/hr over 90 Minutes Intravenous Every 24 hours 07/08/19 1755 07/09/19 0847   07/09/19 1800  vancomycin (VANCOREADY) IVPB 1500 mg/300 mL  1,500 mg 150 mL/hr over 120 Minutes Intravenous Every 24 hours 07/09/19 0847     07/09/19 0500  ceFEPIme (MAXIPIME) 2 g in sodium chloride 0.9 % 100 mL IVPB     2 g 200 mL/hr over 30 Minutes Intravenous Every 12 hours 07/08/19 1752     07/09/19 0200  metroNIDAZOLE (FLAGYL) IVPB 500 mg     500 mg 100 mL/hr over 60 Minutes Intravenous Every 8 hours 07/08/19 2158     07/08/19 1800  vancomycin (VANCOREADY) IVPB 2000 mg/400 mL     2,000 mg 200 mL/hr over 120 Minutes Intravenous  Once 07/08/19 1750 07/08/19 2129   07/08/19 1745  ceFEPIme (MAXIPIME) 2 g in sodium chloride 0.9 % 100 mL IVPB     2 g 200 mL/hr over 30 Minutes Intravenous  Once 07/08/19 1740 07/08/19 1859   07/08/19 1745  metroNIDAZOLE (FLAGYL) IVPB 500 mg     500 mg 100 mL/hr over 60 Minutes Intravenous  Once 07/08/19 1740 07/08/19 1931   07/08/19 1745  vancomycin (VANCOCIN) IVPB 1000 mg/200 mL premix  Status:  Discontinued     1,000 mg 200 mL/hr over 60 Minutes Intravenous  Once 07/08/19 1740 07/08/19 1750      Assessment: Destiny Nash a 74 y.o. female requires anticoagulation with a heparin iv infusion for the indication of  atrial fibrillation. Heparin gtt will be started following pharmacy protocol per pharmacy consult. Patient is on previous oral anticoagulant that will require aPTT/HL correlation before transitioning to only HL monitoring. Last dose of eliquis 2/3AM   Goal of Therapy:  Heparin level 0.3-0.7 units/ml Monitor platelets by anticoagulation protocol: Yes   Plan:  Give 4000 units bolus x 1 Start heparin infusion at 1450 units/hr Check anti-Xa  level in 8 hours and daily while on heparin Continue to monitor H&H and platelets  Heparin level to be drawn in 8 hours for patients >8 years old or crcl < 70m/min  GDonna ChristenCoffee 07/10/2019,5:25 PM

## 2019-07-10 NOTE — Progress Notes (Signed)
Notified midlevel of pt Afib 130 at 2119. Pt alert, oriented, and reports no new pain, palpitations, or chest discomfort. Pt was upset and tearful in regards to the level of care she required. Pt expressed that she was tired and apologized multiple times for bothering staff with the bedpan. I reassured the pt the entire staff is here to help and she is not a burden, also redirected the pt with verbal communication. Pt responded well, bed in low position with active alarms andcall bell in reach.

## 2019-07-10 NOTE — Progress Notes (Signed)
PROGRESS NOTE [facility]   DORETHEA STRUBEL  ZOX:[id number]  DOB: [birth year]  DOA: 07/08/2019 PCP: [name], MD  Brief Admission Hx: 74 y.o. female with medical history significant for breast cancer on active chemotherapy, diabetes mellitus, hypertension, atrial fibrillation.  Patient presented to the ED with complaints of generalized weakness over the past several days.  She had been started on treatment outpatient for a LE cellulitis.  She is having increasing abdominal distension as well.   She was noted to be tachycardic and had a lactic acidosis on arrival.  MDM/Assessment & Plan:   1. Hyperkalemia-Resolved now, was treated medically, recheck BMP in AM.  Continue cardiac monitoring.  2. Mild diastolic CHF exacerbation-treated with IV Lasix.  Resumed home oral Lasix. 3. Hyponatremia-suspect secondary to diuretics.  Improved now. 4. Liver cirrhosis-noted on CT scan.  Could this be from chemotherapy for breast cancer?  She has moderate ascites and is symptomatic and requested ultrasound paracentesis but there was not enough fluid there for removal.  5. Generalized weakness-multifactorial.  PT evaluation. 6. Sepsis secondary to cellulitis-she has been started on broad-spectrum antibiotics IV.  Continue to monitor and supportive care ordered.  Lactate is trending down. 7. Elevated transaminases-suspect secondary to liver cirrhosis.  Following.  See ultrasound abdomen. 8. Type 2 diabetes mellitus-patient has been started on supplemental sliding scale coverage and Lantus for basal coverage.  CBG testing ordered.  Follow hemoglobin A1c which is pending.  Hold home Metformin. 9. Essential hypertension-treated with Cardizem which has been restarted. 10. Stage Ib breast cancer diagnosed in 2-20-followed with Dr. Lindi Adie.  Status post left lumpectomy, adjuvant radiation and adjuvant antiestrogens.  She is on chemotherapy every 3 weeks with kadcyla.  Follow up witih Dr. Lindi Adie.   Pt has  progressively declined and I have asked for palliative consult to assist with goals of care.   11. Atrial fibrillation - currently heart rate is controlled.  She is fully anticoagulated with apixaban.   12. Cholelithiasis with thickened gallbladder walls - possibly related to acute cholecystitis but could also be related to liver cirrhosis as a chronic finding, a HIDA scan is needed to clarify.  Nuc Med not available at this facility, asked Nuc Med techs to arrange for patient to have HIDA scan in [place] and can return to [facility].  Pt is NPO for HIDA scan at [facility] 07/10/19, awaiting results.   DVT prophylaxis: apixaban Code Status: Full  Family Communication: unable to reach daughter today Disposition Plan: continue inpatient management for IV lasix, IV antibiotics, HIDA scan   Consultants:    Procedures:    Antimicrobials:   Anti-infectives (From admission, onward)   Start     Dose/Rate Route Frequency Ordered Stop   07/09/19 1800  vancomycin (VANCOREADY) IVPB 1250 mg/250 mL  Status:  Discontinued     1,250 mg 166.7 mL/hr over 90 Minutes Intravenous Every 24 hours 07/08/19 1755 07/09/19 0847   07/09/19 1800  vancomycin (VANCOREADY) IVPB 1500 mg/300 mL     1,500 mg 150 mL/hr over 120 Minutes Intravenous Every 24 hours 07/09/19 0847     07/09/19 0500  ceFEPIme (MAXIPIME) 2 g in sodium chloride 0.9 % 100 mL IVPB     2 g 200 mL/hr over 30 Minutes Intravenous Every 12 hours 07/08/19 1752     07/09/19 0200  metroNIDAZOLE (FLAGYL) IVPB 500 mg     500 mg 100 mL/hr over 60 Minutes Intravenous Every 8 hours 07/08/19 2158     07/08/19 1800  vancomycin (  VANCOREADY) IVPB 2000 mg/400 mL     2,000 mg 200 mL/hr over 120 Minutes Intravenous  Once 07/08/19 1750 07/08/19 2129   07/08/19 1745  ceFEPIme (MAXIPIME) 2 g in sodium chloride 0.9 % 100 mL IVPB     2 g 200 mL/hr over 30 Minutes Intravenous  Once 07/08/19 1740 07/08/19 1859   07/08/19 1745  metroNIDAZOLE (FLAGYL) IVPB 500 mg     500 mg 100  mL/hr over 60 Minutes Intravenous  Once 07/08/19 1740 07/08/19 1931   07/08/19 1745  vancomycin (VANCOCIN) IVPB 1000 mg/200 mL premix  Status:  Discontinued     1,000 mg 200 mL/hr over 60 Minutes Intravenous  Once 07/08/19 1740 07/08/19 1750      Subjective: Pt says she feels a little stronger today.    Objective: Vitals:   07/09/19 1955 07/09/19 2104 07/10/19 0554 07/10/19 0627  BP:  (!) 141/76 140/72   Pulse:  (!) 119 (!) 114 97  Resp:  18 20   Temp:  98.2 F (36.8 C) 98 F (36.7 C)   TempSrc:      SpO2: 100% 100% 100%   Weight:      Height:        Intake/Output Summary (Last 24 hours) at 07/10/2019 1350 Last data filed at 07/10/2019 0900 Gross per 24 hour  Intake 1194.49 ml  Output 400 ml  Net 794.49 ml   Filed Weights   07/08/19 1451 07/08/19 2130 07/09/19 0558  Weight: 114.7 kg 130.5 kg 129.3 kg     REVIEW OF SYSTEMS  As per history otherwise all reviewed and reported negative  Exam:  General exam: chronically ill appearing, awake, alert, NAD.  Respiratory system: BBS clear, no rales or crackles. No increased work of breathing. Cardiovascular system: irregularly irregular, normal S1 & S2 heard.  Gastrointestinal system: Abdomen is mildly distended, soft and mostly nontender. Normal bowel sounds heard.  Ascites present.  Central nervous system: Alert and oriented to person. No focal neurological deficits. Extremities: chronic venous stasis changes, 2+ edema BLEs.  Data Reviewed: Basic Metabolic Panel: Recent Labs  Lab 07/04/19 1047 07/04/19 1047 07/08/19 1527 07/09/19 0100 07/09/19 0540 07/09/19 0747 07/09/19 1240 07/10/19 0414  NA 135  --  130*  --  131*  --  127* 136  K 4.7   < > 6.2* 6.0* 6.0*  --  5.4* 3.8  CL 102  --  99  --  99  --  96* 100  CO2 25  --  20*  --  23  --  20* 23  GLUCOSE 127*  --  171*  --  124*  --  102* 75  BUN 35*  --  47*  --  47*  --  48* 45*  CREATININE 1.05*  --  1.42*  --  1.33*  --  1.42* 1.21*  CALCIUM 8.1*  --  8.5*   --  8.6*  --  8.2* 8.6*  MG  --   --   --   --   --  1.0*  --  1.3*   < > = values in this interval not displayed.   Liver Function Tests: Recent Labs  Lab 07/04/19 1047 07/08/19 1527 07/09/19 0540 07/10/19 0414  AST 27 51* 53* 59*  ALT 15 23 22 24   ALKPHOS 196* 218* 222* 219*  BILITOT 1.3* 1.8* 1.8* 1.5*  PROT 6.7 6.6 6.3* 5.8*  ALBUMIN 2.7* 2.7* 2.6* 2.5*   Recent Labs  Lab 07/08/19 1527  LIPASE  17   No results for input(s): AMMONIA in the last 168 hours. CBC: Recent Labs  Lab 07/04/19 1047 07/08/19 1527 07/09/19 0540 07/10/19 0414  WBC 9.8 14.6* 15.4* 11.9*  NEUTROABS 7.7  --   --  9.8*  HGB 8.6* 8.6* 8.1* 7.5*  HCT 28.3* 28.7* 26.8* 25.0*  MCV 71.5* 74.7* 74.7* 73.7*  PLT 136* 136* 132* 108*   Cardiac Enzymes: No results for input(s): CKTOTAL, CKMB, CKMBINDEX, TROPONINI in the last 168 hours. CBG (last 3)  Recent Labs    07/09/19 1620 07/09/19 2105 07/10/19 0730  GLUCAP 79 98 70   Recent Results (from the past 240 hour(s))  Respiratory Panel by RT PCR (Flu A&B, Covid) - Nasopharyngeal Swab     Status: None   Collection Time: 07/08/19  5:34 PM   Specimen: Nasopharyngeal Swab  Result Value Ref Range Status   SARS Coronavirus 2 by RT PCR NEGATIVE NEGATIVE Final    Comment: (NOTE) SARS-CoV-2 target nucleic acids are NOT DETECTED. The SARS-CoV-2 RNA is generally detectable in upper respiratoy specimens during the acute phase of infection. The lowest concentration of SARS-CoV-2 viral copies this assay can detect is 131 copies/mL. A negative result does not preclude SARS-Cov-2 infection and should not be used as the sole basis for treatment or other patient management decisions. A negative result may occur with  improper specimen collection/handling, submission of specimen other than nasopharyngeal swab, presence of viral mutation(s) within the areas targeted by this assay, and inadequate number of viral copies (<131 copies/mL). A negative result must be  combined with clinical observations, patient history, and epidemiological information. The expected result is Negative. Fact Sheet for Patients:  PinkCheek.be Fact Sheet for Healthcare Providers:  GravelBags.it This test is not yet ap proved or cleared by the [place] FDA and  has been authorized for detection and/or diagnosis of SARS-CoV-2 by FDA under an Emergency Use Authorization (EUA). This EUA will remain  in effect (meaning this test can be used) for the duration of the COVID-19 declaration under Section 564(b)(1) of the Act, 21 U.S.C. section 360bbb-3(b)(1), unless the authorization is terminated or revoked sooner.    Influenza A by PCR NEGATIVE NEGATIVE Final   Influenza B by PCR NEGATIVE NEGATIVE Final    Comment: (NOTE) The Xpert Xpress SARS-CoV-2/FLU/RSV assay is intended as an aid in  the diagnosis of influenza from Nasopharyngeal swab specimens and  should not be used as a sole basis for treatment. Nasal washings and  aspirates are unacceptable for Xpert Xpress SARS-CoV-2/FLU/RSV  testing. Fact Sheet for Patients: PinkCheek.be Fact Sheet for Healthcare Providers: GravelBags.it This test is not yet approved or cleared by the [place] FDA and  has been authorized for detection and/or diagnosis of SARS-CoV-2 by  FDA under an Emergency Use Authorization (EUA). This EUA will remain  in effect (meaning this test can be used) for the duration of the  Covid-19 declaration under Section 564(b)(1) of the Act, 21  U.S.C. section 360bbb-3(b)(1), unless the authorization is  terminated or revoked. Performed at [facility], [address], [place], [place] [address]   Blood culture (routine x 2)     Status: None (Preliminary result)   Collection Time: 07/08/19  5:40 PM   Specimen: Right Antecubital; Blood  Result Value Ref Range Status   Specimen  Description RIGHT ANTECUBITAL  Final   Special Requests   Final    BOTTLES DRAWN AEROBIC AND ANAEROBIC Blood Culture adequate volume   Culture   Final  NO GROWTH 2 DAYS Performed at [facility], [address], [place], [place] [address]    Report Status PENDING  Incomplete  Blood culture (routine x 2)     Status: None (Preliminary result)   Collection Time: 07/08/19  6:07 PM   Specimen: BLOOD RIGHT HAND  Result Value Ref Range Status   Specimen Description BLOOD RIGHT HAND  Final   Special Requests   Final    BOTTLES DRAWN AEROBIC ONLY Blood Culture adequate volume   Culture   Final    NO GROWTH 2 DAYS Performed at [facility], [address], [place], [place] [address]    Report Status PENDING  Incomplete  MRSA PCR Screening     Status: Abnormal   Collection Time: 07/08/19 10:12 PM   Specimen: Nasal Mucosa; Nasopharyngeal  Result Value Ref Range Status   MRSA by PCR POSITIVE (A) NEGATIVE Final    Comment:        The GeneXpert MRSA Assay (FDA approved for NASAL specimens only), is one component of a comprehensive MRSA colonization surveillance program. It is not intended to diagnose MRSA infection nor to guide or monitor treatment for MRSA infections. RESULT CALLED TO, READ BACK BY AND VERIFIED WITH: J RHEW,RN @0004  07/09/19 [facility] Performed at [facility], [address] [place], [place] [address]   Culture, Urine     Status: None   Collection Time: 07/09/19  2:05 AM   Specimen: Urine, Catheterized  Result Value Ref Range Status   Specimen Description   Final    URINE, CATHETERIZED Performed at [facility], [address] [place], [place] [address]    Special Requests   Final    NONE Performed at [facility], [address], [place], [place] [address]    Culture   Final    NO GROWTH Performed at [facility], [place] [address] [place], [place] [address]    Report Status 07/10/2019 FINAL  Final     Studies: CT ABDOMEN PELVIS WO CONTRAST  Result  Date: 07/09/2019 CLINICAL DATA:  Elevated ALP with lactic acidosis and leukocytosis. History of breast cancer on chemotherapy. Generalized weakness for several days. Multiple loose stools. EXAM: CT CHEST, ABDOMEN AND PELVIS WITHOUT CONTRAST TECHNIQUE: Multidetector CT imaging of the chest, abdomen and pelvis was performed following the standard protocol without IV contrast. COMPARISON:  CT abdomen dated November 29, 2018. FINDINGS: CT CHEST FINDINGS Cardiovascular: The main pulmonary artery is significantly dilated measuring approximately 4 cm in diameter. There is no thoracic aortic aneurysm. Mild atherosclerotic changes are noted of the thoracic aorta. Three-vessel coronary artery disease is noted. There is mild cardiac enlargement. There is a well-positioned right Port-A-Cath with tip terminating in the SVC. Mediastinum/Nodes: --there are multiple mildly enlarged mediastinal lymph nodes. For example there is a 1.3 cm right lower paratracheal lymph node (axial series 2, image 15). There is a 1 cm lymph node at the level of the upper trachea (axial series 2, image 10). These are essentially stable since 01/15/2019. --the patient is status post prior lymph node dissection in the left axillary region. --No supraclavicular lymphadenopathy. --Normal thyroid gland. --The esophagus is unremarkable Lungs/Pleura: There are moderate-sized bilateral pleural effusions, decreased in size from prior study dated November 29, 2018. There is mild interlobular septal thickening with peripheral ground-glass airspace opacities in the anterior left upper lobe. Musculoskeletal: No chest wall abnormality. No acute or significant osseous findings. CT ABDOMEN PELVIS FINDINGS Hepatobiliary: Liver surface is somewhat nodular raising concern for cirrhosis.  The gallbladder is grossly abnormal with diffuse gallbladder wall thickening and suggestion of large gallstones.There is no biliary ductal dilation. Pancreas: Normal contours without ductal  dilatation. No peripancreatic fluid collection. Spleen: No splenic laceration or hematoma. Adrenals/Urinary Tract: --Adrenal glands: No adrenal hemorrhage. --Right kidney/ureter: No hydronephrosis or perinephric hematoma. --Left kidney/ureter: No hydronephrosis or perinephric hematoma. --Urinary bladder: Unremarkable. Stomach/Bowel: --Stomach/Duodenum: No hiatal hernia or other gastric abnormality. Normal duodenal course and caliber. --Small bowel: No dilatation or inflammation. --Colon: No focal abnormality. --Appendix: Not visualized. No right lower quadrant inflammation or free fluid. Vascular/Lymphatic: Atherosclerotic calcification is present within the non-aneurysmal abdominal aorta, without hemodynamically significant stenosis. --there is a pericaval structure measuring approximately 1.4 cm (axial series 2, image 67). Given the branching appearance more inferiorly, this is favored to represent a prominent ovarian vein as opposed to a retroperitoneal lymph node. This appears to be new since prior study. Enlarged lymph nodes are noted in the porta hepatis but are difficult to evaluate due to a lack of IV contrast. --No mesenteric lymphadenopathy. --No pelvic or inguinal lymphadenopathy. Reproductive: The pelvis is poorly evaluated secondary to extensive streak artifact. The patient appears to be status post hysterectomy. Other: There is a moderate volume of abdominal ascites. Diffuse body wall edema is noted there is no free air. Musculoskeletal. No acute displaced fractures. Again noted is a compression fracture of the inferior endplate of the L3 vertebral body, similar to prior study. IMPRESSION: 1. Abnormal appearance of the gallbladder with gallbladder wall thickening and likely underlying gallstones. Correlation with ultrasound is recommended as findings can be seen in patients with acute cholecystitis. 2. The main pulmonary artery is significantly dilated measuring up to 4 cm in diameter. This can be seen  in the setting of pulmonary arterial hypertension. 3. Moderate bilateral pleural effusions, improved from prior study. There is some mild interlobular septal thickening with a few scattered ground-glass airspace opacities favored to represent mild interstitial edema. An atypical infectious process is not excluded. 4. There is a moderate volume abdominal ascites and diffuse body wall edema. Aortic Atherosclerosis (ICD10-I70.0). Electronically Signed   By: Constance Holster M.D.   On: 07/09/2019 02:24   CT CHEST WO CONTRAST  Result Date: 07/09/2019 CLINICAL DATA:  Elevated ALP with lactic acidosis and leukocytosis. History of breast cancer on chemotherapy. Generalized weakness for several days. Multiple loose stools. EXAM: CT CHEST, ABDOMEN AND PELVIS WITHOUT CONTRAST TECHNIQUE: Multidetector CT imaging of the chest, abdomen and pelvis was performed following the standard protocol without IV contrast. COMPARISON:  CT abdomen dated November 29, 2018. FINDINGS: CT CHEST FINDINGS Cardiovascular: The main pulmonary artery is significantly dilated measuring approximately 4 cm in diameter. There is no thoracic aortic aneurysm. Mild atherosclerotic changes are noted of the thoracic aorta. Three-vessel coronary artery disease is noted. There is mild cardiac enlargement. There is a well-positioned right Port-A-Cath with tip terminating in the SVC. Mediastinum/Nodes: --there are multiple mildly enlarged mediastinal lymph nodes. For example there is a 1.3 cm right lower paratracheal lymph node (axial series 2, image 15). There is a 1 cm lymph node at the level of the upper trachea (axial series 2, image 10). These are essentially stable since 01/15/2019. --the patient is status post prior lymph node dissection in the left axillary region. --No supraclavicular lymphadenopathy. --Normal thyroid gland. --The esophagus is unremarkable Lungs/Pleura: There are moderate-sized bilateral pleural effusions, decreased in size from prior  study dated November 29, 2018. There is mild interlobular septal thickening with peripheral ground-glass airspace opacities in the  anterior left upper lobe. Musculoskeletal: No chest wall abnormality. No acute or significant osseous findings. CT ABDOMEN PELVIS FINDINGS Hepatobiliary: Liver surface is somewhat nodular raising concern for cirrhosis. The gallbladder is grossly abnormal with diffuse gallbladder wall thickening and suggestion of large gallstones.There is no biliary ductal dilation. Pancreas: Normal contours without ductal dilatation. No peripancreatic fluid collection. Spleen: No splenic laceration or hematoma. Adrenals/Urinary Tract: --Adrenal glands: No adrenal hemorrhage. --Right kidney/ureter: No hydronephrosis or perinephric hematoma. --Left kidney/ureter: No hydronephrosis or perinephric hematoma. --Urinary bladder: Unremarkable. Stomach/Bowel: --Stomach/Duodenum: No hiatal hernia or other gastric abnormality. Normal duodenal course and caliber. --Small bowel: No dilatation or inflammation. --Colon: No focal abnormality. --Appendix: Not visualized. No right lower quadrant inflammation or free fluid. Vascular/Lymphatic: Atherosclerotic calcification is present within the non-aneurysmal abdominal aorta, without hemodynamically significant stenosis. --there is a pericaval structure measuring approximately 1.4 cm (axial series 2, image 67). Given the branching appearance more inferiorly, this is favored to represent a prominent ovarian vein as opposed to a retroperitoneal lymph node. This appears to be new since prior study. Enlarged lymph nodes are noted in the porta hepatis but are difficult to evaluate due to a lack of IV contrast. --No mesenteric lymphadenopathy. --No pelvic or inguinal lymphadenopathy. Reproductive: The pelvis is poorly evaluated secondary to extensive streak artifact. The patient appears to be status post hysterectomy. Other: There is a moderate volume of abdominal ascites. Diffuse  body wall edema is noted there is no free air. Musculoskeletal. No acute displaced fractures. Again noted is a compression fracture of the inferior endplate of the L3 vertebral body, similar to prior study. IMPRESSION: 1. Abnormal appearance of the gallbladder with gallbladder wall thickening and likely underlying gallstones. Correlation with ultrasound is recommended as findings can be seen in patients with acute cholecystitis. 2. The main pulmonary artery is significantly dilated measuring up to 4 cm in diameter. This can be seen in the setting of pulmonary arterial hypertension. 3. Moderate bilateral pleural effusions, improved from prior study. There is some mild interlobular septal thickening with a few scattered ground-glass airspace opacities favored to represent mild interstitial edema. An atypical infectious process is not excluded. 4. There is a moderate volume abdominal ascites and diffuse body wall edema. Aortic Atherosclerosis (ICD10-I70.0). Electronically Signed   By: Constance Holster M.D.   On: 07/09/2019 02:24   US Abdomen Complete  Result Date: 07/09/2019 CLINICAL DATA:  Abnormal gallbladder on CT.  Denies abdominal pain. EXAM: ABDOMEN ULTRASOUND COMPLETE COMPARISON:  CT abdomen pelvis from same day. FINDINGS: Gallbladder: 3.6 cm gallstone in the gallbladder neck. Sludge. Diffuse gallbladder wall thickening measuring up to 1 cm. No sonographic Murphy sign noted by sonographer. Common bile duct: Diameter: 5 mm, normal. Liver: No focal lesion identified. Nodular contour with heterogeneous parenchymal echogenicity. Portal vein is patent on color Doppler imaging with normal direction of blood flow towards the liver. IVC: No abnormality visualized. Pancreas: Not visualized due to overlying bowel gas. Spleen: Size and appearance within normal limits. Right Kidney: Length: 11.6 cm. Echogenicity within normal limits. No mass or hydronephrosis visualized. Left Kidney: Length: 11.0 cm. Echogenicity  within normal limits. No mass or hydronephrosis visualized. Abdominal aorta: Not visualized due to body habitus and overlying bowel gas. Other findings: Moderate ascites. IMPRESSION: 1. Cholelithiasis and diffuse gallbladder wall thickening. Findings are nonspecific and could be related to underlying liver disease or acute cholecystitis, though the absence of abdominal pain or positive sonographic Murphy sign argues against cholecystitis. Consider hepatobiliary scan for definitive evaluation. 2. Unchanged cirrhosis and  moderate ascites. Electronically Signed   By: Titus Dubin M.D.   On: 07/09/2019 12:50   US Abdomen Limited  Result Date: 07/09/2019 CLINICAL DATA:  Ascites EXAM: LIMITED ABDOMEN ULTRASOUND FOR ASCITES TECHNIQUE: Limited ultrasound survey for ascites was performed in all four abdominal quadrants. COMPARISON:  CT abdomen and pelvis 07/09/2019 FINDINGS: Survey imaging of the abdomen was performed for potential paracentesis. Scattered ascites is identified, majority deep in pelvis and perihepatic. No adequate pocket of ascites is identified at the flanks to allow access for adequate paracentesis. IMPRESSION: No adequate pocket of ascites for paracentesis. Electronically Signed   By: Lavonia Dana M.D.   On: 07/09/2019 14:52   US Venous Img Lower Bilateral (DVT)  Result Date: 07/09/2019 CLINICAL DATA:  Bilateral lower extremity edema and pain. EXAM: BILATERAL LOWER EXTREMITY VENOUS DOPPLER ULTRASOUND TECHNIQUE: Gray-scale sonography with graded compression, as well as color Doppler and duplex ultrasound were performed to evaluate the lower extremity deep venous systems from the level of the common femoral vein and including the common femoral, femoral, profunda femoral, popliteal and calf veins including the posterior tibial, peroneal and gastrocnemius veins when visible. The superficial great saphenous vein was also interrogated. Spectral Doppler was utilized to evaluate flow at rest and with  distal augmentation maneuvers in the common femoral, femoral and popliteal veins. COMPARISON:  None. FINDINGS: RIGHT LOWER EXTREMITY Common Femoral Vein: No evidence of thrombus. Normal compressibility, respiratory phasicity and response to augmentation. Saphenofemoral Junction: No evidence of thrombus. Normal compressibility and flow on color Doppler imaging. Profunda Femoral Vein: No evidence of thrombus. Normal compressibility and flow on color Doppler imaging. Femoral Vein: No evidence of thrombus. Normal compressibility, respiratory phasicity and response to augmentation. Popliteal Vein: No evidence of thrombus. Normal compressibility, respiratory phasicity and response to augmentation. Calf Veins: Extremely limited visualization of calf veins. Superficial Great Saphenous Vein: No evidence of thrombus. Normal compressibility. Venous Reflux:  None. Other Findings: No evidence of superficial thrombophlebitis or abnormal fluid collection. LEFT LOWER EXTREMITY Common Femoral Vein: No evidence of thrombus. Normal compressibility, respiratory phasicity and response to augmentation. Saphenofemoral Junction: No evidence of thrombus. Normal compressibility and flow on color Doppler imaging. Profunda Femoral Vein: No evidence of thrombus. Normal compressibility and flow on color Doppler imaging. Femoral Vein: No evidence of thrombus. Normal compressibility, respiratory phasicity and response to augmentation. Popliteal Vein: No evidence of thrombus. Normal compressibility, respiratory phasicity and response to augmentation. Calf Veins: Extremely limited visualization of calf veins. Superficial Great Saphenous Vein: No evidence of thrombus. Normal compressibility. Venous Reflux:  None. Other Findings: No evidence of superficial thrombophlebitis or abnormal fluid collection. IMPRESSION: No evidence of deep venous thrombosis in either lower extremity. Evaluation of bilateral calf veins was extremely limited due to body  habitus and edema. Electronically Signed   By: Aletta Edouard M.D.   On: 07/09/2019 12:57   DG Chest Portable 1 View  Result Date: 07/08/2019 CLINICAL DATA:  75 year old female with weakness. EXAM: PORTABLE CHEST 1 VIEW COMPARISON:  Chest radiograph dated 01/15/2019 and CT dated 01/15/2019. FINDINGS: Right sided Port-A-Cath in similar position. There is mild cardiomegaly with vascular congestion and probable trace bilateral pleural effusions. No focal consolidation or pneumothorax. No acute osseous pathology. Surgical clips of the left breast noted. IMPRESSION: Mild cardiomegaly with vascular congestion and probable trace bilateral pleural effusions. No focal consolidation. Electronically Signed   By: Anner Crete M.D.   On: 07/08/2019 16:26   ECHOCARDIOGRAM COMPLETE  Result Date: 07/09/2019   ECHOCARDIOGRAM REPORT   Patient  Name:   MERON BOCCHINO Date of Exam: 07/09/2019 Medical Rec #:  [id number]        Height:       66.0 in Accession #:    [id number]       Weight:       285.1 lb Date of Birth:  1945/12/23       BSA:          2.32 m Patient Age:    80 years         BP:           134/58 mmHg Patient Gender: F                HR:           88 bpm. Exam Location:  [facility] Procedure: 2D Echo, Cardiac Doppler and Color Doppler Indications:    CHF-Acute Diastolic 093.81 / W29.93  History:        Patient has prior history of Echocardiogram examinations, most                 recent 02/11/2019. CHF, Arrythmias:Atrial Fibrillation; Risk                 Factors:Hypertension, Diabetes and Dyslipidemia. Breast cancer.  Sonographer:    Alvino Chapel RCS Referring Phys: [phone] [place]  1. Left ventricular ejection fraction, by visual estimation, is 65 to 70%. The left ventricle has hyperdynamic function. There is mildly increased left ventricular hypertrophy.  2. The left ventricle has no regional wall motion abnormalities.  3. Global right ventricle has normal systolic function.The right  ventricular size is normal. No increase in right ventricular wall thickness.  4. Left atrial size was normal.  5. Right atrial size was normal.  6. Mild mitral annular calcification.  7. The mitral valve is grossly normal. Moderate mitral valve regurgitation.  8. The tricuspid valve is grossly normal.  9. The tricuspid valve is grossly normal. Tricuspid valve regurgitation is mild. 10. The aortic valve is tricuspid. Aortic valve regurgitation is not visualized. No evidence of aortic valve sclerosis or stenosis. 11. The pulmonic valve was grossly normal. Pulmonic valve regurgitation is not visualized. 12. The inferior vena cava is dilated in size with <50% respiratory variability, suggesting right atrial pressure of 15 mmHg. FINDINGS  Left Ventricle: Left ventricular ejection fraction, by visual estimation, is 65 to 70%. The left ventricle has hyperdynamic function. The left ventricle has no regional wall motion abnormalities. The left ventricular internal cavity size was the left ventricle is normal in size. There is mildly increased left ventricular hypertrophy. Concentric left ventricular hypertrophy. Left ventricular diastolic parameters were normal. Right Ventricle: The right ventricular size is normal. No increase in right ventricular wall thickness. Global RV systolic function is has normal systolic function. Left Atrium: Left atrial size was normal in size. Right Atrium: Right atrial size was normal in size Pericardium: There is no evidence of pericardial effusion. Mitral Valve: The mitral valve is grossly normal. Mild mitral annular calcification. Moderate mitral valve regurgitation. Tricuspid Valve: The tricuspid valve is grossly normal. Tricuspid valve regurgitation is mild. Aortic Valve: The aortic valve is tricuspid. . There is mild thickening of the aortic valve. Aortic valve regurgitation is not visualized. The aortic valve is structurally normal, with no evidence of sclerosis or stenosis. Mild aortic  valve annular calcification. There is mild thickening of the aortic valve. Pulmonic Valve: The pulmonic valve was grossly normal. Pulmonic valve regurgitation is not visualized.  Pulmonic regurgitation is not visualized. Aorta: The aortic root is normal in size and structure. Venous: The inferior vena cava is dilated in size with less than 50% respiratory variability, suggesting right atrial pressure of 15 mmHg. IAS/Shunts: No atrial level shunt detected by color flow Doppler.  LEFT VENTRICLE PLAX 2D LVIDd:         4.46 cm  Diastology LVIDs:         1.99 cm  LV e' lateral:   17.00 cm/s LV PW:         1.21 cm  LV E/e' lateral: 8.3 LV IVS:        1.29 cm  LV e' medial:    10.60 cm/s LVOT diam:     1.80 cm  LV E/e' medial:  13.3 LV SV:         78 ml LV SV Index:   30.87 LVOT Area:     2.54 cm  RIGHT VENTRICLE RV S prime:     14.40 cm/s TAPSE (M-mode): 1.9 cm LEFT ATRIUM             Index       RIGHT ATRIUM           Index LA diam:        3.00 cm 1.29 cm/m  RA Area:     18.90 cm LA Vol (A2C):   79.7 ml 34.28 ml/m RA Volume:   55.90 ml  24.04 ml/m LA Vol (A4C):   75.8 ml 32.60 ml/m LA Biplane Vol: 78.1 ml 33.59 ml/m  AORTIC VALVE LVOT Vmax:   123.00 cm/s LVOT Vmean:  82.000 cm/s LVOT VTI:    0.248 m  AORTA Ao Root diam: 3.10 cm MITRAL VALVE MV Area (PHT): 4.31 cm             SHUNTS MV PHT:        51.04 msec           Systemic VTI:  0.25 m MV Decel Time: 176 msec             Systemic Diam: 1.80 cm MV E velocity: 141.00 cm/s 103 cm/s  Kate Sable MD Electronically signed by Kate Sable MD Signature Date/Time: 07/09/2019/9:29:55 AM    Final    Scheduled Meds: . apixaban  5 mg Oral BID  . Chlorhexidine Gluconate Cloth  6 each Topical Q0600  . diltiazem  300 mg Oral Daily  . furosemide  60 mg Oral Daily  . insulin aspart  0-5 Units Subcutaneous QHS  . insulin aspart  0-9 Units Subcutaneous TID WC  . insulin glargine  5 Units Subcutaneous QHS  . mouth rinse  15 mL Mouth Rinse q12n4p  . mupirocin  ointment  1 application Nasal BID   Continuous Infusions: . ceFEPime (MAXIPIME) IV 2 g (07/09/19 2135)  . magnesium sulfate bolus IVPB    . metronidazole 500 mg (07/10/19 0126)  . vancomycin 1,500 mg (07/09/19 1735)    Principal Problem:   Diastolic congestive heart failure (HCC) Active Problems:   Essential hypertension   Unspecified atrial fibrillation (HCC)   Atrial fibrillation (HCC)   Type 2 diabetes mellitus (HCC)   Generalized weakness   Breast cancer (HCC)   Pressure injury of skin   Liver cirrhosis secondary to NASH (nonalcoholic steatohepatitis) ([place])   Ascites   Cholelithiasis with gallbladder wall thickening   Chronic anticoagulation  Time spent:   Irwin Brakeman, MD [facility] 07/10/2019, 1:50 PM    LOS: 2  days  How to contact the [facility] Attending or Consulting provider [place] or covering provider during after hours [place], for this patient?  1. Check the care team in [facility] and look for a) attending/consulting [facility] provider listed and b) the [facility] team listed 2. Log into [email or web address] and use [place]'s universal password to access. If you do not have the password, please contact the hospital operator. 3. Locate the [facility] provider you are looking for under [facility] and page to a number that you can be directly reached. 4. If you still have difficulty reaching the provider, please page the [facility] (Director on Call) for the Hospitalists listed on amion for assistance.

## 2019-07-11 ENCOUNTER — Encounter: Payer: Self-pay | Admitting: Hematology and Oncology

## 2019-07-11 ENCOUNTER — Encounter: Payer: Self-pay | Admitting: General Practice

## 2019-07-11 DIAGNOSIS — K8000 Calculus of gallbladder with acute cholecystitis without obstruction: Secondary | ICD-10-CM

## 2019-07-11 LAB — CBC WITH DIFFERENTIAL/PLATELET
Abs Immature Granulocytes: 0.07 10*3/uL (ref 0.00–0.07)
Basophils Absolute: 0 10*3/uL (ref 0.0–0.1)
Basophils Relative: 0 %
Eosinophils Absolute: 0.2 10*3/uL (ref 0.0–0.5)
Eosinophils Relative: 1 %
HCT: 24.8 % — ABNORMAL LOW (ref 36.0–46.0)
Hemoglobin: 7.6 g/dL — ABNORMAL LOW (ref 12.0–15.0)
Immature Granulocytes: 1 %
Lymphocytes Relative: 8 %
Lymphs Abs: 0.9 10*3/uL (ref 0.7–4.0)
MCH: 22.8 pg — ABNORMAL LOW (ref 26.0–34.0)
MCHC: 30.6 g/dL (ref 30.0–36.0)
MCV: 74.3 fL — ABNORMAL LOW (ref 80.0–100.0)
Monocytes Absolute: 1.2 10*3/uL — ABNORMAL HIGH (ref 0.1–1.0)
Monocytes Relative: 11 %
Neutro Abs: 8.6 10*3/uL — ABNORMAL HIGH (ref 1.7–7.7)
Neutrophils Relative %: 79 %
Platelets: 120 10*3/uL — ABNORMAL LOW (ref 150–400)
RBC: 3.34 MIL/uL — ABNORMAL LOW (ref 3.87–5.11)
RDW: 25.6 % — ABNORMAL HIGH (ref 11.5–15.5)
WBC: 10.9 10*3/uL — ABNORMAL HIGH (ref 4.0–10.5)
nRBC: 0.4 % — ABNORMAL HIGH (ref 0.0–0.2)

## 2019-07-11 LAB — COMPREHENSIVE METABOLIC PANEL
ALT: 21 U/L (ref 0–44)
AST: 53 U/L — ABNORMAL HIGH (ref 15–41)
Albumin: 2.3 g/dL — ABNORMAL LOW (ref 3.5–5.0)
Alkaline Phosphatase: 209 U/L — ABNORMAL HIGH (ref 38–126)
Anion gap: 10 (ref 5–15)
BUN: 36 mg/dL — ABNORMAL HIGH (ref 8–23)
CO2: 25 mmol/L (ref 22–32)
Calcium: 8 mg/dL — ABNORMAL LOW (ref 8.9–10.3)
Chloride: 100 mmol/L (ref 98–111)
Creatinine, Ser: 1.08 mg/dL — ABNORMAL HIGH (ref 0.44–1.00)
GFR calc Af Amer: 60 mL/min — ABNORMAL LOW (ref >60)
GFR calc non Af Amer: 52 mL/min — ABNORMAL LOW (ref >60)
Glucose, Bld: 169 mg/dL — ABNORMAL HIGH (ref 70–99)
Potassium: 2.5 mmol/L — CL (ref 3.5–5.1)
Sodium: 135 mmol/L (ref 135–145)
Total Bilirubin: 1.5 mg/dL — ABNORMAL HIGH (ref 0.3–1.2)
Total Protein: 5.9 g/dL — ABNORMAL LOW (ref 6.5–8.1)

## 2019-07-11 LAB — GLUCOSE, CAPILLARY
Glucose-Capillary: 76 mg/dL (ref 70–99)
Glucose-Capillary: 145 mg/dL — ABNORMAL HIGH (ref 70–99)
Glucose-Capillary: 170 mg/dL — ABNORMAL HIGH (ref 70–99)
Glucose-Capillary: 254 mg/dL — ABNORMAL HIGH (ref 70–99)
Glucose-Capillary: 224 mg/dL — ABNORMAL HIGH (ref 70–99)

## 2019-07-11 LAB — MAGNESIUM: Magnesium: 1.5 mg/dL — ABNORMAL LOW (ref 1.7–2.4)

## 2019-07-11 LAB — HEPARIN LEVEL (UNFRACTIONATED): Heparin Unfractionated: >2.2 IU/mL — ABNORMAL HIGH (ref 0.30–0.70)

## 2019-07-11 LAB — APTT: aPTT: >200 seconds (ref 24–36)

## 2019-07-11 MED ORDER — MEGESTROL ACETATE 400 MG/10ML PO SUSP
400.0000 mg | Freq: Two times a day (BID) | ORAL | Status: DC
  Administered 2019-07-11 – 2019-07-14 (×7): 400 mg via ORAL
  Filled 2019-07-11 (×7): qty 10

## 2019-07-11 MED ORDER — APIXABAN 5 MG PO TABS
5.0000 mg | ORAL_TABLET | Freq: Two times a day (BID) | ORAL | Status: DC
  Administered 2019-07-11 – 2019-07-14 (×7): 5 mg via ORAL
  Filled 2019-07-11 (×7): qty 1

## 2019-07-11 MED ORDER — POTASSIUM CHLORIDE 10 MEQ/100ML IV SOLN
10.0000 meq | INTRAVENOUS | Status: AC
  Administered 2019-07-11 (×4): 10 meq via INTRAVENOUS

## 2019-07-11 MED ORDER — MAGNESIUM SULFATE 4 GM/100ML IV SOLN
4.0000 g | Freq: Once | INTRAVENOUS | Status: AC
  Administered 2019-07-11: 4 g via INTRAVENOUS
  Filled 2019-07-11: qty 100

## 2019-07-11 MED ORDER — SODIUM CHLORIDE 0.9 % IV SOLN
2.0000 g | Freq: Three times a day (TID) | INTRAVENOUS | Status: AC
  Administered 2019-07-11 – 2019-07-13 (×8): 2 g via INTRAVENOUS
  Filled 2019-07-11 (×6): qty 2

## 2019-07-11 NOTE — Evaluation (Signed)
Physical Therapy Evaluation Patient Details Name: Destiny Nash MRN: [id number] DOB: March 19, 1946 Today's Date: 07/11/2019   History of Present Illness  Destiny Nash is a 74 y.o. female with medical history significant for breast cancer on chemotherapy, diabetes mellitus, hypertension, atrial fibrillation.  Patient presented to the ED with complaints of generalized weakness over the past several days.  Her son reports difficulty breathing that started today.  No cough.  No chest pain.  She had has bilateral chronic lower extremity swelling, that she thinks has worsened over the past 2 to 3 weeks.She reports about 4 episodes of vomiting today, none prior.  She also reports chronic loose stools-averaging 2 episodes per day which is unchanged and related to her chemotherapy.  No abdominal pain.  No dysuria.She reports yesterday she was so weak that she fell while trying to climb the stairs.  She landed on her buttock.  She denies pain to pelvic area.  She did not lose consciousness and she did not hit her head.Patient is on treatment for cellulitis involving her lower extremity, doxycycline, for several days.  She has chronic redness of her lower extremity which has not significantly changed.    Clinical Impression  Patient has difficulty sitting up at bedside demonstrating slow labored movement with frequent rest breaks, limited to a few unsteady slow steps at bedside, and high fall risk mostly due to c/o fatigue/generalized weakness.  Patient tolerated sitting up in chair after therapy - RN aware.  Patient will benefit from continued physical therapy in hospital and recommended venue below to increase strength, balance, endurance for safe ADLs and gait.     Follow Up Recommendations SNF    Equipment Recommendations  None recommended by PT    Recommendations for Other Services       Precautions / Restrictions Precautions Precautions: Fall Restrictions Weight Bearing Restrictions: No       Mobility  Bed Mobility Overal bed mobility: Needs Assistance Bed Mobility: Supine to Sit     Supine to sit: Mod assist     General bed mobility comments: increased time, labored movement with frequent rest breaks  Transfers Overall transfer level: Needs assistance Equipment used: Rolling walker (2 wheeled) Transfers: Sit to/from [facility] Sit to Stand: Min assist;Mod assist Stand pivot transfers: Min assist;Mod assist       General transfer comment: slow labored movement  Ambulation/Gait Ambulation/Gait assistance: Mod assist Gait Distance (Feet): 5 Feet Assistive device: Rolling walker (2 wheeled) Gait Pattern/deviations: Decreased step length - right;Decreased step length - left;Decreased stride length Gait velocity: decreased   General Gait Details: limited to 5-6 slow labored slightly unsteady steps at bedside due to c/o fatigue and generalized weakness  Stairs            Wheelchair Mobility    Modified Rankin (Stroke Patients Only)       Balance Overall balance assessment: Needs assistance Sitting-balance support: Feet supported;No upper extremity supported Sitting balance-Leahy Scale: Fair Sitting balance - Comments: fair/good seated at bedside   Standing balance support: During functional activity;Bilateral upper extremity supported Standing balance-Leahy Scale: Fair Standing balance comment: using RW                             Pertinent Vitals/Pain Pain Assessment: No/denies pain    Home Living Family/patient expects to be discharged to:: Private residence Living Arrangements: Alone Available Help at Discharge: Family;Available 24 hours/day(Her daughter lives behind her house) Type of  Home: Mobile home Home Access: Ramped entrance;Stairs to enter   Entrance Stairs-Number of Steps: 3 Home Layout: One level Home Equipment: Shower seat - built in;Walker - 2 wheels;Cane - single point      Prior Function  Level of Independence: Independent with assistive device(s)         Comments: household ambulator with RW, was walking without AD over a month ago     Hand Dominance        Extremity/Trunk Assessment   Upper Extremity Assessment Upper Extremity Assessment: Generalized weakness    Lower Extremity Assessment Lower Extremity Assessment: Generalized weakness    Cervical / Trunk Assessment Cervical / Trunk Assessment: Normal  Communication   Communication: No difficulties  Cognition Arousal/Alertness: Awake/alert Behavior During Therapy: WFL for tasks assessed/performed Overall Cognitive Status: Within Functional Limits for tasks assessed                                        General Comments      Exercises     Assessment/Plan    PT Assessment Patient needs continued PT services  PT Problem List Decreased strength;Decreased activity tolerance;Decreased balance;Decreased mobility       PT Treatment Interventions Gait training;Functional mobility training;Therapeutic activities;Therapeutic exercise;Patient/family education    PT Goals (Current goals can be found in the Care Plan section)  Acute Rehab PT Goals Patient Stated Goal: return home after rehab PT Goal Formulation: With patient Time For Goal Achievement: 07/25/19 Potential to Achieve Goals: Good    Frequency Min 3X/week   Barriers to discharge        Co-evaluation               AM-PAC PT "6 Clicks" Mobility  Outcome Measure Help needed turning from your back to your side while in a flat bed without using bedrails?: A Little Help needed moving from lying on your back to sitting on the side of a flat bed without using bedrails?: A Lot Help needed moving to and from a bed to a chair (including a wheelchair)?: A Lot Help needed standing up from a chair using your arms (e.g., wheelchair or bedside chair)?: A Lot Help needed to walk in hospital room?: A Lot Help needed climbing  3-5 steps with a railing? : A Lot 6 Click Score: 13    End of Session   Activity Tolerance: Patient tolerated treatment well;Patient limited by fatigue Patient left: in chair;with call bell/phone within reach Nurse Communication: Mobility status PT Visit Diagnosis: Unsteadiness on feet (R26.81);Other abnormalities of gait and mobility (R26.89);Muscle weakness (generalized) (M62.81)    Time: 2694-8546 PT Time Calculation (min) (ACUTE ONLY): 31 min   Charges:   PT Evaluation $PT Eval Moderate Complexity: 1 Mod PT Treatments $Therapeutic Activity: 23-37 mins        9:31 AM, 07/11/19 Lonell Grandchild, MPT Physical Therapist with [facility] [phone] office [phone] mobile phone

## 2019-07-11 NOTE — Progress Notes (Signed)
ANTICOAGULATION CONSULT NOTE -   Pharmacy Consult for heparin gtt  Indication: atrial fibrillation  Allergies  Allergen Reactions  . Levaquin [Levofloxacin] Other (See Comments)    Joint "seize" and constipation  . Codeine Other (See Comments)    Severe headache    Patient Measurements: Height: 5' 6"  (167.6 cm) Weight: 280 lb 3.3 oz (127.1 kg) IBW/kg (Calculated) : 59.3 Heparin Dosing Weight: HEPARIN DW (KG): 91   Vital Signs: Temp: 97.5 F (36.4 C) (02/05 0536) Temp Source: Oral (02/05 0536) BP: 138/68 (02/05 0536) Pulse Rate: 104 (02/05 0536)  Labs: Recent Labs    07/09/19 0540 07/09/19 0540 07/09/19 1240 07/10/19 0414 07/11/19 0540  HGB 8.1*   < >  --  7.5* 7.6*  HCT 26.8*  --   --  25.0* 24.8*  PLT 132*  --   --  108* 120*  APTT  --   --   --   --  >200*  HEPARINUNFRC  --   --   --   --  >2.20*  CREATININE 1.33*   < > 1.42* 1.21* 1.08*   < > = values in this interval not displayed.    Estimated Creatinine Clearance: 65.2 mL/min (A) (by C-G formula based on SCr of 1.08 mg/dL (H)).   Medical History: Past Medical History:  Diagnosis Date  . Anemia    in the past   . Anxiety   . Atrial fibrillation ([place])    a. diagnosed in 02/2018.  . Bronchitis    easily  develops bronchitis when she has a cold  . Cancer ([place])    L breast - CA, Feb. 4th   . Cellulitis   . Chronic kidney disease   . Diabetes mellitus without complication ([place])    type 2  . Dyspnea   . Dysrhythmia    afib, treating with Eliquis  . Family history of breast cancer   . Family history of uterine cancer   . Heart murmur   . Hyperlipidemia   . Hypertension     Medications:  Medications Prior to Admission  Medication Sig Dispense Refill Last Dose  . acetaminophen (TYLENOL) 500 MG tablet Take 1,000 mg by mouth every 6 (six) hours as needed for moderate pain or headache.   Past Week at Unknown time  . albuterol (PROVENTIL HFA;VENTOLIN HFA) 108 (90 Base) MCG/ACT inhaler Inhale 2  puffs into the lungs every 6 (six) hours as needed for wheezing or shortness of breath.   07/08/2019 at Unknown time  . apixaban (ELIQUIS) 5 MG TABS tablet Take 1 tablet (5 mg total) by mouth 2 (two) times daily. 60 tablet 0 07/08/2019 at 730  . cholestyramine (QUESTRAN) 4 g packet Take 1 packet (4 g total) by mouth 3 (three) times daily with meals. (Patient taking differently: Take 4 g by mouth daily as needed. ) 60 each 12 Past Month at Unknown time  . CINNAMON PO Take 1 tablet by mouth daily.   07/08/2019 at Unknown time  . diltiazem (CARDIZEM CD) 300 MG 24 hr capsule Take 1 capsule (300 mg total) by mouth daily. 90 capsule 3 07/08/2019 at Unknown time  . diphenoxylate-atropine (LOMOTIL) 2.5-0.025 MG tablet TAKE 1 TABLET BY MOUTH 4 TIMES DAILY AS NEEDED FOR DIARRHEA OR  LOOSE  STOOLS (Patient taking differently: Take 1 tablet by mouth 4 (four) times daily as needed for diarrhea or loose stools. ) 120 tablet 0 07/08/2019 at Unknown time  . doxycycline (VIBRA-TABS) 100 MG tablet Take 1  tablet (100 mg total) by mouth 2 (two) times daily. 60 tablet 3 07/08/2019 at Unknown time  . furosemide (LASIX) 40 MG tablet Take 1.5 tablets (60 mg total) by mouth daily. 135 tablet 3 07/07/2019 at Unknown time  . insulin detemir (LEVEMIR) 100 UNIT/ML injection Inject 0.11 mLs (11 Units total) into the skin at bedtime. (Patient taking differently: Inject 8 Units into the skin at bedtime. )  0 07/07/2019 at Unknown time  . LORazepam (ATIVAN) 1 MG tablet Take 1 tablet 30 min before radiotherapy, PRN anxiety and claustrophobia. (Patient taking differently: Take 1 mg by mouth daily as needed for anxiety. ) 30 tablet 3 Past Week at Unknown time  . Magnesium Oxide 400 MG CAPS Take 1 capsule (400 mg total) by mouth daily. 90 capsule 3 07/08/2019 at Unknown time  . metFORMIN (GLUCOPHAGE) 500 MG tablet Take 1,000 mg by mouth 2 (two) times daily with a meal.    07/08/2019 at Unknown time  . niacin 500 MG tablet Take 500 mg by mouth daily.   07/08/2019  at Unknown time  . NYSTATIN powder Apply 1 Bottle topically daily as needed (irritation).    07/08/2019 at Unknown time  . Omega-3 1000 MG CAPS Take 1,000 mg by mouth 2 (two) times daily.   07/08/2019 at Unknown time  . ondansetron (ZOFRAN) 8 MG tablet Take 1 tablet (8 mg total) by mouth 2 (two) times daily as needed (Nausea or vomiting). 30 tablet 1 07/08/2019 at Unknown time  . pantoprazole (PROTONIX) 40 MG tablet Take 1 tablet by mouth once daily (Patient taking differently: Take 40 mg by mouth daily. ) 90 tablet 0 07/08/2019 at Unknown time  . potassium chloride SA (KLOR-CON) 20 MEQ tablet Take 1 tablet (20 mEq total) by mouth 2 (two) times daily. 60 tablet 2 07/08/2019 at Unknown time  . pravastatin (PRAVACHOL) 20 MG tablet Take 20 mg by mouth daily before breakfast.   0 07/08/2019 at Unknown time  . prochlorperazine (COMPAZINE) 10 MG tablet Take 1 tablet (10 mg total) by mouth every 6 (six) hours as needed (Nausea or vomiting). 30 tablet 1 Past Week at Unknown time  . traMADol (ULTRAM) 50 MG tablet Take 1 tablet (50 mg total) by mouth every 6 (six) hours as needed. (Patient taking differently: Take 50 mg by mouth every 6 (six) hours as needed for moderate pain. ) 20 tablet 0 Past Week at Unknown time   Scheduled:  . Chlorhexidine Gluconate Cloth  6 each Topical Q0600  . diltiazem  300 mg Oral Daily  . furosemide  60 mg Oral Daily  . insulin aspart  0-5 Units Subcutaneous QHS  . insulin aspart  0-9 Units Subcutaneous TID WC  . insulin glargine  5 Units Subcutaneous QHS  . mouth rinse  15 mL Mouth Rinse q12n4p  . mupirocin ointment  1 application Nasal BID   Infusions:  . sodium chloride 500 mL (07/10/19 1638)  . ceFEPime (MAXIPIME) IV 2 g (07/10/19 2130)  . heparin 1,450 Units/hr (07/10/19 1841)  . magnesium sulfate bolus IVPB    . metronidazole 500 mg (07/11/19 0155)  . potassium chloride    . vancomycin 1,500 mg (07/10/19 1855)   PRN: sodium chloride, acetaminophen **OR** acetaminophen,  albuterol, ondansetron **OR** ondansetron (ZOFRAN) IV, polyethylene glycol, sodium chloride flush Anti-infectives (From admission, onward)   Start     Dose/Rate Route Frequency Ordered Stop   07/09/19 1800  vancomycin (VANCOREADY) IVPB 1250 mg/250 mL  Status:  Discontinued  1,250 mg 166.7 mL/hr over 90 Minutes Intravenous Every 24 hours 07/08/19 1755 07/09/19 0847   07/09/19 1800  vancomycin (VANCOREADY) IVPB 1500 mg/300 mL     1,500 mg 150 mL/hr over 120 Minutes Intravenous Every 24 hours 07/09/19 0847     07/09/19 0500  ceFEPIme (MAXIPIME) 2 g in sodium chloride 0.9 % 100 mL IVPB     2 g 200 mL/hr over 30 Minutes Intravenous Every 12 hours 07/08/19 1752     07/09/19 0200  metroNIDAZOLE (FLAGYL) IVPB 500 mg     500 mg 100 mL/hr over 60 Minutes Intravenous Every 8 hours 07/08/19 2158     07/08/19 1800  vancomycin (VANCOREADY) IVPB 2000 mg/400 mL     2,000 mg 200 mL/hr over 120 Minutes Intravenous  Once 07/08/19 1750 07/08/19 2129   07/08/19 1745  ceFEPIme (MAXIPIME) 2 g in sodium chloride 0.9 % 100 mL IVPB     2 g 200 mL/hr over 30 Minutes Intravenous  Once 07/08/19 1740 07/08/19 1859   07/08/19 1745  metroNIDAZOLE (FLAGYL) IVPB 500 mg     500 mg 100 mL/hr over 60 Minutes Intravenous  Once 07/08/19 1740 07/08/19 1931   07/08/19 1745  vancomycin (VANCOCIN) IVPB 1000 mg/200 mL premix  Status:  Discontinued     1,000 mg 200 mL/hr over 60 Minutes Intravenous  Once 07/08/19 1740 07/08/19 1750      Assessment: Destiny Nash a 74 y.o. female requires anticoagulation with a heparin iv infusion for the indication of  atrial fibrillation. Heparin gtt will be started following pharmacy protocol per pharmacy consult. Patient is on previous oral anticoagulant that will require aPTT/HL correlation before transitioning to only HL monitoring. Last dose of eliquis 2/3AM   aptt > 200 HL > 2.20  Goal of Therapy:  APTT 66-102 sec Heparin level 0.3-0.7 units/ml Monitor platelets by  anticoagulation protocol: Yes   Plan:  Hold heparin x 1 hour. Decrease heparin infusion to 1000 units/hr. Check anti-Xa level and aPTT in 8 hours and daily while on heparin. Continue to monitor H&H and platelets.  Margot Ables, PharmD Clinical Pharmacist 07/11/2019 8:03 AM

## 2019-07-11 NOTE — Consult Note (Signed)
Reason for Consult: Cholelithiasis Referring Physician: Irwin Brakeman, MD  Destiny Nash is an 74 y.o. female with medical history significant forbreast cancer on active chemotherapy, diabetes mellitus, hypertension, atrial fibrillation.Patient presented to the ED with complaints of generalized weakness over the past several days.  She had been started on treatment outpatient for a LE cellulitis.  She is having increasing abdominal distension as well.   She was noted to be tachycardic and had a lactic acidosis with leukocytosis and elevated alkaline phosphatase. CT of abdomen and pelvis demonstrated gallbladder wall thickening and subsequent ultrasound shows a 3.6cm gallstone, diffuse wall thickening and a normal sized common bile duct. HIDA scan was unable to visualize the gallbladder.  HPI: Destiny Nash is on hospital day 3 on vancomycin, cefepime, and flagyl in treatment for sepsis in setting of cellulitis and bilateral pleural effusions, surgery is consulted for findings on CT and ultrasound positive for cholelithiasis. Destiny Nash reports that she has not had any abdominal pain either with or without association with food. Her last treatment with chemotherapy was 7 days prior after which she experienced nausea and diarrhea as she normally does with her treatments. She says that her nausea this time was worse than normal and she did experience an episode of vomiting around the time of admission. Her appetite has generally been low but this is not a new change for her, she does crave milk.   Past Medical History:  Diagnosis Date  . Anemia    in the past   . Anxiety   . Atrial fibrillation ([place])    a. diagnosed in 02/2018.  . Bronchitis    easily  develops bronchitis when she has a cold  . Cancer ([place])    L breast - CA, Feb. 4th   . Cellulitis   . Chronic kidney disease   . Diabetes mellitus without complication ([place])    type 2  . Dyspnea   . Dysrhythmia    afib, treating with Eliquis   . Family history of breast cancer   . Family history of uterine cancer   . Heart murmur   . Hyperlipidemia   . Hypertension     Past Surgical History:  Procedure Laterality Date  . BREAST LUMPECTOMY WITH RADIOACTIVE SEED AND SENTINEL LYMPH NODE BIOPSY Left 12/10/2018   Procedure: LEFT BREAST LUMPECTOMY WITH RADIOACTIVE SEED AND LEFT SENTINEL LYMPH NODE MAPPING;  Surgeon: Erroll Luna, MD;  Location: [facility];  Service: General;  Laterality: Left;  . DILATION AND CURETTAGE OF UTERUS     x2 on same day  . PORTACATH PLACEMENT N/A 08/01/2018   Procedure: INSERTION PORT-A-CATH WITH ULTRASOUND;  Surgeon: Erroll Luna, MD;  Location: [facility];  Service: General;  Laterality: N/A;  . TONSILLECTOMY AND ADENOIDECTOMY      Family History  Problem Relation Age of Onset  . CAD Brother   . Hypertension Mother   . Hyperlipidemia Mother   . Hypertension Brother   . Uterine cancer Maternal Grandmother   . Breast cancer Cousin        dx in her 49s; mat first cousin  . Breast cancer Cousin        dx in her 30s-60s; mat first cousin    Social History:  reports that she has never smoked. She has never used smokeless tobacco. She reports previous alcohol use. She reports that she does not use drugs.  Allergies:  Allergies  Allergen Reactions  . Levaquin [Levofloxacin] Other (See Comments)    Joint "  seize" and constipation  . Codeine Other (See Comments)    Severe headache    Medications: I have reviewed the patient's current medications.  Results for orders placed or performed during the hospital encounter of 07/08/19 (from the past 48 hour(s))  Basic metabolic panel     Status: Abnormal   Collection Time: 07/09/19 12:40 PM  Result Value Ref Range   Sodium 127 (L) 135 - 145 mmol/L   Potassium 5.4 (H) 3.5 - 5.1 mmol/L   Chloride 96 (L) 98 - 111 mmol/L   CO2 20 (L) 22 - 32 mmol/L   Glucose, Bld 102 (H) 70 - 99 mg/dL   BUN 48 (H) 8 - 23 mg/dL   Creatinine, Ser 1.42 (H) 0.44 - 1.00 mg/dL    Calcium 8.2 (L) 8.9 - 10.3 mg/dL   GFR calc non Af Amer 37 (L) >60 mL/min   GFR calc Af Amer 43 (L) >60 mL/min   Anion gap 11 5 - 15    Comment: Performed at [facility], [address], [place], [place] [address]  Glucose, capillary     Status: None   Collection Time: 07/09/19  4:20 PM  Result Value Ref Range   Glucose-Capillary 79 70 - 99 mg/dL  Glucose, capillary     Status: None   Collection Time: 07/09/19  9:05 PM  Result Value Ref Range   Glucose-Capillary 98 70 - 99 mg/dL  Comprehensive metabolic panel     Status: Abnormal   Collection Time: 07/10/19  4:14 AM  Result Value Ref Range   Sodium 136 135 - 145 mmol/L    Comment: DELTA CHECK NOTED   Potassium 3.8 3.5 - 5.1 mmol/L   Chloride 100 98 - 111 mmol/L   CO2 23 22 - 32 mmol/L   Glucose, Bld 75 70 - 99 mg/dL   BUN 45 (H) 8 - 23 mg/dL   Creatinine, Ser 1.21 (H) 0.44 - 1.00 mg/dL   Calcium 8.6 (L) 8.9 - 10.3 mg/dL   Total Protein 5.8 (L) 6.5 - 8.1 g/dL   Albumin 2.5 (L) 3.5 - 5.0 g/dL   AST 59 (H) 15 - 41 U/L   ALT 24 0 - 44 U/L   Alkaline Phosphatase 219 (H) 38 - 126 U/L   Total Bilirubin 1.5 (H) 0.3 - 1.2 mg/dL   GFR calc non Af Amer 45 (L) >60 mL/min   GFR calc Af Amer 52 (L) >60 mL/min   Anion gap 13 5 - 15    Comment: Performed at [facility], [address], [place], [place] [address]  CBC with Differential/Platelet     Status: Abnormal   Collection Time: 07/10/19  4:14 AM  Result Value Ref Range   WBC 11.9 (H) 4.0 - 10.5 K/uL   RBC 3.39 (L) 3.87 - 5.11 MIL/uL   Hemoglobin 7.5 (L) 12.0 - 15.0 g/dL    Comment: Reticulocyte Hemoglobin testing may be clinically indicated, consider ordering this additional test [id number]    HCT 25.0 (L) 36.0 - 46.0 %   MCV 73.7 (L) 80.0 - 100.0 fL   MCH 22.1 (L) 26.0 - 34.0 pg   MCHC 30.0 30.0 - 36.0 g/dL   RDW 25.2 (H) 11.5 - 15.5 %   Platelets 108 (L) 150 - 400 K/uL    Comment: PLATELET COUNT CONFIRMED BY SMEAR SPECIMEN CHECKED FOR CLOTS Immature Platelet Fraction may  be clinically indicated, consider ordering this additional test [id number]    nRBC 0.0 0.0 - 0.2 %  Neutrophils Relative % 82 %   Neutro Abs 9.8 (H) 1.7 - 7.7 K/uL   Lymphocytes Relative 6 %   Lymphs Abs 0.7 0.7 - 4.0 K/uL   Monocytes Relative 10 %   Monocytes Absolute 1.2 (H) 0.1 - 1.0 K/uL   Eosinophils Relative 1 %   Eosinophils Absolute 0.1 0.0 - 0.5 K/uL   Basophils Relative 0 %   Basophils Absolute 0.0 0.0 - 0.1 K/uL   RBC Morphology ANISOCYTOSIS    Immature Granulocytes 1 %   Abs Immature Granulocytes 0.06 0.00 - 0.07 K/uL    Comment: Performed at [facility], [address], [place], [place] [address]  Magnesium     Status: Abnormal   Collection Time: 07/10/19  4:14 AM  Result Value Ref Range   Magnesium 1.3 (L) 1.7 - 2.4 mg/dL    Comment: Performed at [facility], [address], [place], [place] [address]  Glucose, capillary     Status: None   Collection Time: 07/10/19  7:30 AM  Result Value Ref Range   Glucose-Capillary 70 70 - 99 mg/dL  Glucose, capillary     Status: None   Collection Time: 07/10/19  4:16 PM  Result Value Ref Range   Glucose-Capillary 79 70 - 99 mg/dL  Glucose, capillary     Status: Abnormal   Collection Time: 07/10/19  9:21 PM  Result Value Ref Range   Glucose-Capillary 163 (H) 70 - 99 mg/dL  Comprehensive metabolic panel     Status: Abnormal   Collection Time: 07/11/19  5:40 AM  Result Value Ref Range   Sodium 135 135 - 145 mmol/L   Potassium 2.5 (LL) 3.5 - 5.1 mmol/L    Comment: CRITICAL RESULT CALLED TO, READ BACK BY AND VERIFIED WITH: RHEW,J AT 7:00AM ON 07/11/19 BY FESTERMAN,C    Chloride 100 98 - 111 mmol/L   CO2 25 22 - 32 mmol/L   Glucose, Bld 169 (H) 70 - 99 mg/dL   BUN 36 (H) 8 - 23 mg/dL   Creatinine, Ser 1.08 (H) 0.44 - 1.00 mg/dL   Calcium 8.0 (L) 8.9 - 10.3 mg/dL   Total Protein 5.9 (L) 6.5 - 8.1 g/dL   Albumin 2.3 (L) 3.5 - 5.0 g/dL   AST 53 (H) 15 - 41 U/L   ALT 21 0 - 44 U/L   Alkaline Phosphatase 209 (H) 38 - 126  U/L   Total Bilirubin 1.5 (H) 0.3 - 1.2 mg/dL   GFR calc non Af Amer 52 (L) >60 mL/min   GFR calc Af Amer 60 (L) >60 mL/min   Anion gap 10 5 - 15    Comment: Performed at [facility], [address] [place],  [address]  CBC with Differential/Platelet     Status: Abnormal   Collection Time: 07/11/19  5:40 AM  Result Value Ref Range   WBC 10.9 (H) 4.0 - 10.5 K/uL   RBC 3.34 (L) 3.87 - 5.11 MIL/uL   Hemoglobin 7.6 (L) 12.0 - 15.0 g/dL    Comment: Reticulocyte Hemoglobin testing may be clinically indicated, consider ordering this additional test [id number]    HCT 24.8 (L) 36.0 - 46.0 %   MCV 74.3 (L) 80.0 - 100.0 fL   MCH 22.8 (L) 26.0 - 34.0 pg   MCHC 30.6 30.0 - 36.0 g/dL   RDW 25.6 (H) 11.5 - 15.5 %   Platelets 120 (L) 150 - 400 K/uL    Comment: REPEATED TO VERIFY PLATELET COUNT CONFIRMED BY SMEAR SPECIMEN  CHECKED FOR CLOTS Immature Platelet Fraction may be clinically indicated, consider ordering this additional test [id number]    nRBC 0.4 (H) 0.0 - 0.2 %   Neutrophils Relative % 79 %   Neutro Abs 8.6 (H) 1.7 - 7.7 K/uL   Lymphocytes Relative 8 %   Lymphs Abs 0.9 0.7 - 4.0 K/uL   Monocytes Relative 11 %   Monocytes Absolute 1.2 (H) 0.1 - 1.0 K/uL   Eosinophils Relative 1 %   Eosinophils Absolute 0.2 0.0 - 0.5 K/uL   Basophils Relative 0 %   Basophils Absolute 0.0 0.0 - 0.1 K/uL   Immature Granulocytes 1 %   Abs Immature Granulocytes 0.07 0.00 - 0.07 K/uL   Schistocytes PRESENT     Comment: Performed at [facility], [address], [place], [place] [address]  Magnesium     Status: Abnormal   Collection Time: 07/11/19  5:40 AM  Result Value Ref Range   Magnesium 1.5 (L) 1.7 - 2.4 mg/dL    Comment: Performed at [facility], [address], [place], [place] [address]  Heparin level (unfractionated)     Status: Abnormal   Collection Time: 07/11/19  5:40 AM  Result Value Ref Range   Heparin Unfractionated >2.20 (H) 0.30 - 0.70 IU/mL    Comment: RESULTS CONFIRMED BY  MANUAL DILUTION Performed at [facility], [address], [place], [place] [address]   APTT     Status: Abnormal   Collection Time: 07/11/19  5:40 AM  Result Value Ref Range   aPTT >200 (HH) 24 - 36 seconds    Comment:        IF BASELINE aPTT IS ELEVATED, SUGGEST PATIENT RISK ASSESSMENT BE USED TO DETERMINE APPROPRIATE ANTICOAGULANT THERAPY. REPEATED TO VERIFY CRITICAL RESULT CALLED TO, READ BACK BY AND VERIFIED WITH: JACKSON N @ 0740 ON [id number] BY HENDERSON L. Performed at [facility], [address] [place], [place] [address]   Glucose, capillary     Status: Abnormal   Collection Time: 07/11/19  7:42 AM  Result Value Ref Range   Glucose-Capillary 145 (H) 70 - 99 mg/dL    US Abdomen Complete  Result Date: 07/09/2019 CLINICAL DATA:  Abnormal gallbladder on CT.  Denies abdominal pain. EXAM: ABDOMEN ULTRASOUND COMPLETE COMPARISON:  CT abdomen pelvis from same day. FINDINGS: Gallbladder: 3.6 cm gallstone in the gallbladder neck. Sludge. Diffuse gallbladder wall thickening measuring up to 1 cm. No sonographic Murphy sign noted by sonographer. Common bile duct: Diameter: 5 mm, normal. Liver: No focal lesion identified. Nodular contour with heterogeneous parenchymal echogenicity. Portal vein is patent on color Doppler imaging with normal direction of blood flow towards the liver. IVC: No abnormality visualized. Pancreas: Not visualized due to overlying bowel gas. Spleen: Size and appearance within normal limits. Right Kidney: Length: 11.6 cm. Echogenicity within normal limits. No mass or hydronephrosis visualized. Left Kidney: Length: 11.0 cm. Echogenicity within normal limits. No mass or hydronephrosis visualized. Abdominal aorta: Not visualized due to body habitus and overlying bowel gas. Other findings: Moderate ascites. IMPRESSION: 1. Cholelithiasis and diffuse gallbladder wall thickening. Findings are nonspecific and could be related to underlying liver disease or acute cholecystitis, though  the absence of abdominal pain or positive sonographic Murphy sign argues against cholecystitis. Consider hepatobiliary scan for definitive evaluation. 2. Unchanged cirrhosis and moderate ascites. Electronically Signed   By: Titus Dubin M.D.   On: 07/09/2019 12:50   NM Hepato W/EF  Result Date: 07/10/2019 CLINICAL DATA:  Cholelithiasis, question cholecystitis EXAM: NUCLEAR MEDICINE  HEPATOBILIARY IMAGING TECHNIQUE: Sequential images of the abdomen were obtained out to 60 minutes following intravenous administration of radiopharmaceutical. Additional imaging was performed until 90 minutes. Delayed anterior and RIGHT lateral images were obtained 4 hours. Patient has not a history of allergy to codeine and does not know if she has an allergy to morphine; therefore morphine augmentation was not performed. RADIOPHARMACEUTICALS:  5.2 mCi Tc-78m Choletec IV COMPARISON:  Ultrasound abdomen 07/09/2019, CT abdomen 07/09/2019 FINDINGS: Prompt clearance of tracer from bloodstream indicating normal hepatocellular function. Prompt excretion of tracer into biliary tree. Small bowel visualized at 20 minutes. At 90 minutes gallbladder had not visualized. Delayed images at 4 hours demonstrate tracer activity over the chest and liver; the uptake over the liver appears too cranial for position of the gallbladder as identified on coronal CT images and I suspect both sites represents superimposed IV tubing, which is present laterally on the first hour of imaging. IMPRESSION: Nonvisualization of the gallbladder despite delayed imaging to 4 hours. Findings consistent with acute cholecystitis. Electronically Signed   By: MLavonia DanaM.D.   On: 07/10/2019 16:25   [place]domen Limited  Result Date: 07/09/2019 CLINICAL DATA:  Ascites EXAM: LIMITED ABDOMEN ULTRASOUND FOR ASCITES TECHNIQUE: Limited ultrasound survey for ascites was performed in all four abdominal quadrants. COMPARISON:  CT abdomen and pelvis 07/09/2019 FINDINGS: Survey  imaging of the abdomen was performed for potential paracentesis. Scattered ascites is identified, majority deep in pelvis and perihepatic. No adequate pocket of ascites is identified at the flanks to allow access for adequate paracentesis. IMPRESSION: No adequate pocket of ascites for paracentesis. Electronically Signed   By: MLavonia DanaM.D.   On: 07/09/2019 14:52   [place]Venous Img Lower Bilateral (DVT)  Result Date: 07/09/2019 CLINICAL DATA:  Bilateral lower extremity edema and pain. EXAM: BILATERAL LOWER EXTREMITY VENOUS DOPPLER ULTRASOUND TECHNIQUE: Gray-scale sonography with graded compression, as well as color Doppler and duplex ultrasound were performed to evaluate the lower extremity deep venous systems from the level of the common femoral vein and including the common femoral, femoral, profunda femoral, popliteal and calf veins including the posterior tibial, peroneal and gastrocnemius veins when visible. The superficial great saphenous vein was also interrogated. Spectral Doppler was utilized to evaluate flow at rest and with distal augmentation maneuvers in the common femoral, femoral and popliteal veins. COMPARISON:  None. FINDINGS: RIGHT LOWER EXTREMITY Common Femoral Vein: No evidence of thrombus. Normal compressibility, respiratory phasicity and response to augmentation. Saphenofemoral Junction: No evidence of thrombus. Normal compressibility and flow on color Doppler imaging. Profunda Femoral Vein: No evidence of thrombus. Normal compressibility and flow on color Doppler imaging. Femoral Vein: No evidence of thrombus. Normal compressibility, respiratory phasicity and response to augmentation. Popliteal Vein: No evidence of thrombus. Normal compressibility, respiratory phasicity and response to augmentation. Calf Veins: Extremely limited visualization of calf veins. Superficial Great Saphenous Vein: No evidence of thrombus. Normal compressibility. Venous Reflux:  None. Other Findings: No evidence of  superficial thrombophlebitis or abnormal fluid collection. LEFT LOWER EXTREMITY Common Femoral Vein: No evidence of thrombus. Normal compressibility, respiratory phasicity and response to augmentation. Saphenofemoral Junction: No evidence of thrombus. Normal compressibility and flow on color Doppler imaging. Profunda Femoral Vein: No evidence of thrombus. Normal compressibility and flow on color Doppler imaging. Femoral Vein: No evidence of thrombus. Normal compressibility, respiratory phasicity and response to augmentation. Popliteal Vein: No evidence of thrombus. Normal compressibility, respiratory phasicity and response to augmentation. Calf Veins: Extremely limited visualization of calf veins. Superficial Great Saphenous  Vein: No evidence of thrombus. Normal compressibility. Venous Reflux:  None. Other Findings: No evidence of superficial thrombophlebitis or abnormal fluid collection. IMPRESSION: No evidence of deep venous thrombosis in either lower extremity. Evaluation of bilateral calf veins was extremely limited due to body habitus and edema. Electronically Signed   By: Aletta Edouard M.D.   On: 07/09/2019 12:57    ROS:  Pertinent items are noted in HPI.  Blood pressure 138/68, pulse (!) 104, temperature (!) 97.5 F (36.4 C), temperature source Oral, resp. rate 18, height 5' 6"  (1.676 m), weight 127.1 kg, SpO2 95 %. Physical Exam:  Gen: alert and oriented, chronically ill-appearing, sitting up comfortably in chair CV: irregular rhythm, normal S1 and S2 Pulm: Diminished breath sounds at base of lungs, normal work of breathing Abdomen: soft, mildly distended, non-tender, normal bowel sounds, ascites present, negative murphy's sign Extremities: +2 edema bilaterally, chronic venous stasis changes  Assessment/Plan: Jasemine Nawaz is a 74 year old female admitted for volume overload in setting of multiple contributing factors including diastolic CHF, liver cirrhosis with ascites, and history of  breast cancer on active chemotherapy, surgery was consulted for positive CT, ultrasound, and HIDA scan findings for cholecystitis. The patient is not currently experiencing any symptoms and her sepsis is improving on antibiotics.  If she started to have symptoms of cholelithiasis she would need evaluation by interventional radiology for placement of a cholecystomy tube as with her multiple comorbidities she is not a surgical candidate. It is unlikely that her sepsis was due to acute cholecystitis as she is showing improvement with antibiotics and it would be unlikely to experience this complication without ever having symptoms of biliary colic.  -Advance diet as tolerated -Monitor for symptoms of cholecystitis -Continue medical management for sepsis likely secondary to cellulitis  Marcelino Duster 07/11/2019, 11:46 AM

## 2019-07-11 NOTE — Progress Notes (Signed)
This morning tele called to say patient had 22 beat run of vtach.  Spoke with Dr. Wynetta Emery in hallway concerning this and we discussed that magnesium and potassium IV piggybacks were being started.  Patient was up in chair and asymptomatic.  Magnesium and potassium runs have been completed and have had no further calls from central monitoring. Patient's daughter has been at bedside.

## 2019-07-11 NOTE — NC FL2 (Signed)
[facility] LEVEL OF CARE SCREENING TOOL     IDENTIFICATION  Patient Name: Destiny Nash Birthdate: [birth year] Sex: female Admission Date (Current Location): 07/08/2019  [facility] [place] Number:  [employer] and Address:  [place] [address], [place]      Provider Number: [id number]  Attending Physician Name and Address:  Murlean Iba, MD  Relative Name and Phone Number:       Current Level of Care: Hospital Recommended Level of Care: [place] Prior Approval Number:    Date Approved/Denied:   PASRR Number:    Discharge Plan: SNF    Current Diagnoses: Patient Active Problem List   Diagnosis Date Noted  . Palliative care by specialist   . Pressure injury of skin 07/09/2019  . Liver cirrhosis secondary to NASH (nonalcoholic steatohepatitis) ([place]) 07/09/2019  . Ascites 07/09/2019  . Cholelithiasis with gallbladder wall thickening 07/09/2019  . Chronic anticoagulation 07/09/2019  . Generalized weakness 07/08/2019  . Breast cancer ([place]) 07/08/2019  . Diastolic congestive heart failure ([place]) 07/08/2019  . Hypoglycemia 10/20/2018  . Atrial fibrillation ([place]) 10/20/2018  . Type 2 diabetes mellitus ([place]) 10/20/2018  . Genetic testing 10/14/2018  . Family history of breast cancer   . Family history of uterine cancer   . Port-A-Cath in place 08/09/2018  . Malignant neoplasm of upper-outer quadrant of left breast in female, estrogen receptor positive ([place]) 07/25/2018  . Unspecified atrial fibrillation ([place]) 02/14/2018  . Cellulitis, leg 02/13/2018  . Uncontrolled type 2 diabetes mellitus with hyperglycemia, with long-term current use of insulin ([place]) 02/13/2018  . Essential hypertension 02/13/2018  . Hyperlipidemia 02/13/2018  . Obesity, Class III, BMI 40-49.9 (morbid obesity) ([place]) 02/13/2018    Orientation RESPIRATION BLADDER Height & Weight     Self, Time, Situation, Place  O2  Continent Weight: 127.1 kg Height:  5' 6"  (167.6 cm)  BEHAVIORAL SYMPTOMS/MOOD NEUROLOGICAL BOWEL NUTRITION STATUS      Continent Diet(see DC summary)  AMBULATORY STATUS COMMUNICATION OF NEEDS Skin   Extensive Assist Verbally Normal                       Personal Care Assistance Level of Assistance  Bathing, Feeding, Dressing Bathing Assistance: Limited assistance Feeding assistance: Independent Dressing Assistance: Limited assistance     Functional Limitations Info  Sight, Speech, Hearing Sight Info: Adequate Hearing Info: Adequate Speech Info: Adequate    SPECIAL CARE FACTORS FREQUENCY  PT (By licensed PT)     PT Frequency: 5x/week              Contractures Contractures Info: Not present    Additional Factors Info  Code Status, Allergies Code Status Info: Full Allergies Info: levaquin, codeine           Current Medications (07/11/2019):  This is the current hospital active medication list Current Facility-Administered Medications  Medication Dose Route Frequency Provider Last Rate Last Admin  . 0.9 %  sodium chloride infusion   Intravenous PRN Wynetta Emery, Clanford L, MD 10 mL/hr at 07/10/19 1638 500 mL at 07/10/19 1638  . acetaminophen (TYLENOL) tablet 650 mg  650 mg Oral Q6H PRN Emokpae, Ejiroghene E, MD       Or  . acetaminophen (TYLENOL) suppository 650 mg  650 mg Rectal Q6H PRN Emokpae, Ejiroghene E, MD      . albuterol (PROVENTIL) (2.5 MG/3ML) 0.083% nebulizer solution 3 mL  3 mL Inhalation Q6H PRN Emokpae,  Ejiroghene E, MD      . apixaban (ELIQUIS) tablet 5 mg  5 mg Oral BID Johnson, Clanford L, MD   5 mg at 07/11/19 1231  . ceFEPIme (MAXIPIME) 2 g in sodium chloride 0.9 % 100 mL IVPB  2 g Intravenous Q8H Johnson, Clanford L, MD 200 mL/hr at 07/11/19 0831 2 g at 07/11/19 0831  . Chlorhexidine Gluconate Cloth 2 % PADS 6 each  6 each Topical Q0600 Emokpae, Ejiroghene E, MD   6 each at 07/11/19 0608  . diltiazem (CARDIZEM CD) 24 hr capsule 300 mg  300 mg  Oral Daily Emokpae, Ejiroghene E, MD   300 mg at 07/11/19 0833  . furosemide (LASIX) tablet 60 mg  60 mg Oral Daily Johnson, Clanford L, MD   60 mg at 07/11/19 9574  . insulin aspart (novoLOG) injection 0-5 Units  0-5 Units Subcutaneous QHS Emokpae, Ejiroghene E, MD      . insulin aspart (novoLOG) injection 0-9 Units  0-9 Units Subcutaneous TID WC Emokpae, Ejiroghene E, MD   2 Units at 07/11/19 1221  . insulin glargine (LANTUS) injection 5 Units  5 Units Subcutaneous QHS Wynetta Emery, Clanford L, MD   5 Units at 07/10/19 2138  . MEDLINE mouth rinse  15 mL Mouth Rinse q12n4p Emokpae, Ejiroghene E, MD   15 mL at 07/11/19 1224  . megestrol (MEGACE) 400 MG/10ML suspension 400 mg  400 mg Oral BID Wynetta Emery, Clanford L, MD   400 mg at 07/11/19 1221  . metroNIDAZOLE (FLAGYL) IVPB 500 mg  500 mg Intravenous Q8H Emokpae, Ejiroghene E, MD 100 mL/hr at 07/11/19 0900 500 mg at 07/11/19 0900  . mupirocin ointment (BACTROBAN) 2 % 1 application  1 application Nasal BID Emokpae, Ejiroghene E, MD   1 application at 73/40/37 0859  . ondansetron (ZOFRAN) tablet 4 mg  4 mg Oral Q6H PRN Emokpae, Ejiroghene E, MD       Or  . ondansetron (ZOFRAN) injection 4 mg  4 mg Intravenous Q6H PRN Emokpae, Ejiroghene E, MD   4 mg at 07/11/19 0943  . polyethylene glycol (MIRALAX / GLYCOLAX) packet 17 g  17 g Oral Daily PRN Emokpae, Ejiroghene E, MD      . sodium chloride flush (NS) 0.9 % injection 3 mL  3 mL Intravenous PRN Emokpae, Ejiroghene E, MD   3 mL at 07/09/19 2138     Discharge Medications: Please see discharge summary for a list of discharge medications.  Relevant Imaging Results:  Relevant Lab Results:   Additional Information SSN [id number]  Pearlie Lafosse, Chauncey [place], RN

## 2019-07-11 NOTE — Plan of Care (Signed)
   Problem: Acute Rehab PT Goals(only PT should resolve) Goal: Pt Will Go Supine/Side To Sit 07/11/2019 0933 by Lonell Grandchild, PT Outcome: Progressing Flowsheets (Taken 07/11/2019 0933) Pt will go Supine/Side to Sit:  with min guard assist  with minimal assist 07/11/2019 0932 by Lonell Grandchild, PT Outcome: Progressing Flowsheets (Taken 07/11/2019 0932) Pt will go Supine/Side to Sit:  with min guard assist  with minimal assist Goal: Patient Will Perform Sitting Balance 07/11/2019 0933 by Lonell Grandchild, PT Outcome: Progressing Flowsheets (Taken 07/11/2019 0933) Patient will perform sitting balance: with modified independence 07/11/2019 0932 by Lonell Grandchild, PT Outcome: Progressing Goal: Pt Will Transfer Bed To Chair/Chair To Bed 07/11/2019 0933 by Lonell Grandchild, PT Outcome: Progressing Flowsheets (Taken 07/11/2019 0933) Pt will Transfer Bed to Chair/Chair to Bed:  min guard assist  with min assist 07/11/2019 0932 by Lonell Grandchild, PT Outcome: Progressing Goal: Pt Will Ambulate 07/11/2019 0933 by Lonell Grandchild, PT Outcome: Progressing Flowsheets (Taken 07/11/2019 0933) Pt will Ambulate:  25 feet  with minimal assist  with rolling walker 07/11/2019 0932 by Lonell Grandchild, PT Outcome: Progressing   Problem: Acute Rehab PT Goals(only PT should resolve) Goal: Patient Will Transfer Sit To/From Stand Outcome: Progressing Flowsheets (Taken 07/11/2019 0933) Patient will transfer sit to/from stand:  with minimal assist  with min guard assist    9:34 AM, 07/11/19 Lonell Grandchild, MPT Physical Therapist with [facility] 336 [phone] office [phone] mobile phone

## 2019-07-11 NOTE — Progress Notes (Signed)
Pharmacy Antibiotic Note  Destiny Nash is a 74 y.o. female admitted on 07/08/2019 with sepsis secondary to cellulitis.  Pharmacy has been consulted for cefepime dosing.  Plan: Continue Cefepime 2000 mg IV every 8 hours. Monitor labs, c/s, and patient improvement.  Height: 5' 6"  (167.6 cm) Weight: 280 lb 3.3 oz (127.1 kg) IBW/kg (Calculated) : 59.3  Temp (24hrs), Avg:97.9 F (36.6 C), Min:97.5 F (36.4 C), Max:98.2 F (36.8 C)  Recent Labs  Lab 07/08/19 1527 07/08/19 1702 07/08/19 1955 07/09/19 0100 07/09/19 0540 07/09/19 0957 07/09/19 1240 07/10/19 0414 07/11/19 0540  WBC 14.6*  --   --   --  15.4*  --   --  11.9* 10.9*  CREATININE 1.42*  --   --   --  1.33*  --  1.42* 1.21* 1.08*  LATICACIDVEN  --  4.4* 3.5* 2.9*  --  2.2*  --   --   --     Estimated Creatinine Clearance: 65.2 mL/min (A) (by C-G formula based on SCr of 1.08 mg/dL (H)).    Allergies  Allergen Reactions  . Levaquin [Levofloxacin] Other (See Comments)    Joint "seize" and constipation  . Codeine Other (See Comments)    Severe headache    Antimicrobials this admission: 2/1 vancomycin >> 2/5 2/1 cefepime >>  Flagyl 2/5 >>  Microbiology results: 2/2 MRSA PCR: positive 2/2 BCx: ngtd 2/3 UCx: neg  Thank you for allowing pharmacy to be a part of this patient's care.  Ramond Craver 07/11/2019 11:01 AM

## 2019-07-11 NOTE — Progress Notes (Signed)
[facility] CSW Progress Notes  Call fro daughter and Marita [place].  States mother "has not heard anything about the [employer]".  Reviewed chart, per notes from [place], patient has not completed her part of application.  Provided [place] contact information to daughter.  Also told daughter patient has not completed her portion of [place] application which can help w unpaid medical expenses for cancer treatment.  Emailed another copy of application to daughter so she can assist mother w completing this.  Edwyna Shell, LCSW Clinical Social Worker Phone:  [phone] Cell:  [phone]

## 2019-07-11 NOTE — Care Management Important Message (Signed)
Important Message  Patient Details  Name: Destiny Nash MRN: [id number] Date of Birth: December 16, 1945   Medicare Important Message Given:  Yes     Tommy Medal 07/11/2019, 2:07 PM

## 2019-07-11 NOTE — Care Management (Signed)
Patient recommended for SNF. Discussed with patient, she is in agreement with SNF. Discussed all local facilities in [place]. Will refer out.

## 2019-07-11 NOTE — Progress Notes (Signed)
PROGRESS NOTE [facility]   Destiny Nash  EGB:[id number]  DOB: 1945-08-02  DOA: 07/08/2019 PCP: [name], MD  Brief Admission Hx: 74 y.o. female with medical history significant for breast cancer on active chemotherapy, diabetes mellitus, hypertension, atrial fibrillation.  Patient presented to the ED with complaints of generalized weakness over the past several days.  She had been started on treatment outpatient for a LE cellulitis.  She is having increasing abdominal distension as well.   She was noted to be tachycardic and had a lactic acidosis on arrival.  MDM/Assessment & Plan:   1. Hypokalemia-replacement today ordered.  Replete low magnesium.  Continue cardiac monitoring.  2. Mild diastolic CHF exacerbation-treated with IV Lasix.  Resumed home oral Lasix. 3. Hyponatremia-suspect secondary to diuretics.  Improved now.   4. Liver cirrhosis-noted on CT scan.  Could this be from chemotherapy for breast cancer?  She has moderate ascites and is symptomatic and requested ultrasound paracentesis but there was not enough fluid there for removal.  5. Generalized weakness-multifactorial.  PT recommending SNF.  TOC consulted for placement.  6. Sepsis secondary to cellulitis-she has been started on broad-spectrum antibiotics IV.  Continue to monitor and supportive care ordered.  Lactate is trending down.  De-escalate antibiotics 07/11/19.  7. Elevated transaminases-suspect secondary to liver cirrhosis.  Following.  See ultrasound abdomen. 8. 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  CBG testing ordered.  A1c 9.1 which is poorly controlled disease.  Hold home Metformin. 9. Essential hypertension-treated with Cardizem which has been restarted. 10. Stage Ib breast cancer diagnosed in 2/20-followed with Dr. Lindi Adie.  Status post left lumpectomy, adjuvant radiation and  adjuvant antiestrogens.  She is on chemotherapy every 3 weeks with kadcyla.  Follow up witih Dr. Lindi Adie.   Pt has progressively declined since diagnosis and I have asked for palliative consult to assist with goals of care.  They were not able to see 2/4 and unavailable until 2/9.  11. Atrial fibrillation - currently heart rate is controlled.  She is fully anticoagulated with apixaban (currently on hold) and on IV heparin pending surgery consult.   12. Cholelithiasis with thickened gallbladder walls - HIDA scan suggesting acute cholecystitis - Dr. Arnoldo Morale consulted and will see today.  Pt NPO.  Heparin IV for now (apixaban on hold)  DVT prophylaxis: heparin IV Code Status: Full  Family Communication: unable to reach daughter today Disposition Plan: continue inpatient management for IV antibiotics, surgery consult  Consultants:    Procedures:    Antimicrobials:   Anti-infectives (From admission, onward)   Start     Dose/Rate Route Frequency Ordered Stop   07/11/19 0900  ceFEPIme (MAXIPIME) 2 g in sodium chloride 0.9 % 100 mL IVPB     2 g 200 mL/hr over 30 Minutes Intravenous Every 8 hours 07/11/19 0813     07/09/19 1800  vancomycin (VANCOREADY) IVPB 1250 mg/250 mL  Status:  Discontinued     1,250 mg 166.7 mL/hr over 90 Minutes Intravenous Every 24 hours 07/08/19 1755 07/09/19 0847   07/09/19 1800  vancomycin (VANCOREADY) IVPB 1500 mg/300 mL     1,500 mg 150 mL/hr over 120 Minutes Intravenous Every 24 hours 07/09/19 0847     07/09/19 0500  ceFEPIme (MAXIPIME) 2 g in sodium chloride 0.9 % 100 mL IVPB  Status:  Discontinued     2 g 200 mL/hr over 30 Minutes Intravenous Every 12 hours 07/08/19  1752 07/11/19 0813   07/09/19 0200  metroNIDAZOLE (FLAGYL) IVPB 500 mg     500 mg 100 mL/hr over 60 Minutes Intravenous Every 8 hours 07/08/19 2158     07/08/19 1800  vancomycin (VANCOREADY) IVPB 2000 mg/400 mL     2,000 mg 200 mL/hr over 120 Minutes Intravenous  Once 07/08/19 1750 07/08/19 2129    07/08/19 1745  ceFEPIme (MAXIPIME) 2 g in sodium chloride 0.9 % 100 mL IVPB     2 g 200 mL/hr over 30 Minutes Intravenous  Once 07/08/19 1740 07/08/19 1859   07/08/19 1745  metroNIDAZOLE (FLAGYL) IVPB 500 mg     500 mg 100 mL/hr over 60 Minutes Intravenous  Once 07/08/19 1740 07/08/19 1931   07/08/19 1745  vancomycin (VANCOCIN) IVPB 1000 mg/200 mL premix  Status:  Discontinued     1,000 mg 200 mL/hr over 60 Minutes Intravenous  Once 07/08/19 1740 07/08/19 1750      Subjective: Pt sitting up in chair.  No specific complaints.  No abdominal pain or nausea.    Objective: Vitals:   07/10/19 1937 07/10/19 2120 07/11/19 0500 07/11/19 0536  BP:  136/61  138/68  Pulse:  96  (!) 104  Resp:  18  18  Temp:  98.2 F (36.8 C)  (!) 97.5 F (36.4 C)  TempSrc:    Oral  SpO2: 98% 100%  95%  Weight:   127.1 kg   Height:        Intake/Output Summary (Last 24 hours) at 07/11/2019 1050 Last data filed at 07/11/2019 0604 Gross per 24 hour  Intake 1085.76 ml  Output 1250 ml  Net -164.24 ml   Filed Weights   07/08/19 2130 07/09/19 0558 07/11/19 0500  Weight: 130.5 kg 129.3 kg 127.1 kg   REVIEW OF SYSTEMS  As per history otherwise all reviewed and reported negative  Exam:  General exam: chronically ill appearing, awake, alert, NAD.  Respiratory system: BBS clear, no rales or crackles. No increased work of breathing. Cardiovascular system: irregularly irregular, normal S1 & S2 heard.  Gastrointestinal system: Abdomen is mildly distended, soft and mostly nontender. Normal bowel sounds heard.  Ascites present.  Central nervous system: Alert and oriented to person. No focal neurological deficits. Extremities: chronic venous stasis changes, 2+ edema BLEs.  Data Reviewed: Basic Metabolic Panel: Recent Labs  Lab 07/08/19 1527 07/08/19 1527 07/09/19 0100 07/09/19 0540 07/09/19 0747 07/09/19 1240 07/10/19 0414 07/11/19 0540  NA 130*  --   --  131*  --  127* 136 135  K 6.2*   < > 6.0*  6.0*  --  5.4* 3.8 2.5*  CL 99  --   --  99  --  96* 100 100  CO2 20*  --   --  23  --  20* 23 25  GLUCOSE 171*  --   --  124*  --  102* 75 169*  BUN 47*  --   --  47*  --  48* 45* 36*  CREATININE 1.42*  --   --  1.33*  --  1.42* 1.21* 1.08*  CALCIUM 8.5*  --   --  8.6*  --  8.2* 8.6* 8.0*  MG  --   --   --   --  1.0*  --  1.3* 1.5*   < > = values in this interval not displayed.   Liver Function Tests: Recent Labs  Lab 07/08/19 1527 07/09/19 0540 07/10/19 0414 07/11/19 0540  AST 51* 53* 59*  53*  ALT 23 22 24 21   ALKPHOS 218* 222* 219* 209*  BILITOT 1.8* 1.8* 1.5* 1.5*  PROT 6.6 6.3* 5.8* 5.9*  ALBUMIN 2.7* 2.6* 2.5* 2.3*   Recent Labs  Lab 07/08/19 1527  LIPASE 17   No results for input(s): AMMONIA in the last 168 hours. CBC: Recent Labs  Lab 07/08/19 1527 07/09/19 0540 07/10/19 0414 07/11/19 0540  WBC 14.6* 15.4* 11.9* 10.9*  NEUTROABS  --   --  9.8* 8.6*  HGB 8.6* 8.1* 7.5* 7.6*  HCT 28.7* 26.8* 25.0* 24.8*  MCV 74.7* 74.7* 73.7* 74.3*  PLT 136* 132* 108* 120*   Cardiac Enzymes: No results for input(s): CKTOTAL, CKMB, CKMBINDEX, TROPONINI in the last 168 hours. CBG (last 3)  Recent Labs    07/10/19 1616 07/10/19 2121 07/11/19 0742  GLUCAP 79 163* 145*   Recent Results (from the past 240 hour(s))  Respiratory Panel by RT PCR (Flu A&B, Covid) - Nasopharyngeal Swab     Status: None   Collection Time: 07/08/19  5:34 PM   Specimen: Nasopharyngeal Swab  Result Value Ref Range Status   SARS Coronavirus 2 by RT PCR NEGATIVE NEGATIVE Final    Comment: (NOTE) SARS-CoV-2 target nucleic acids are NOT DETECTED. The SARS-CoV-2 RNA is generally detectable in upper respiratoy specimens during the acute phase of infection. The lowest concentration of SARS-CoV-2 viral copies this assay can detect is 131 copies/mL. A negative result does not preclude SARS-Cov-2 infection and should not be used as the sole basis for treatment or other patient management decisions. A  negative result may occur with  improper specimen collection/handling, submission of specimen other than nasopharyngeal swab, presence of viral mutation(s) within the areas targeted by this assay, and inadequate number of viral copies (<131 copies/mL). A negative result must be combined with clinical observations, patient history, and epidemiological information. The expected result is Negative. Fact Sheet for Patients:  PinkCheek.be Fact Sheet for Healthcare Providers:  GravelBags.it This test is not yet ap proved or cleared by the [place] FDA and  has been authorized for detection and/or diagnosis of SARS-CoV-2 by FDA under an Emergency Use Authorization (EUA). This EUA will remain  in effect (meaning this test can be used) for the duration of the COVID-19 declaration under Section 564(b)(1) of the Act, 21 U.S.C. section 360bbb-3(b)(1), unless the authorization is terminated or revoked sooner.    Influenza A by PCR NEGATIVE NEGATIVE Final   Influenza B by PCR NEGATIVE NEGATIVE Final    Comment: (NOTE) The Xpert Xpress SARS-CoV-2/FLU/RSV assay is intended as an aid in  the diagnosis of influenza from Nasopharyngeal swab specimens and  should not be used as a sole basis for treatment. Nasal washings and  aspirates are unacceptable for Xpert Xpress SARS-CoV-2/FLU/RSV  testing. Fact Sheet for Patients: PinkCheek.be Fact Sheet for Healthcare Providers: GravelBags.it This test is not yet approved or cleared by the [place] FDA and  has been authorized for detection and/or diagnosis of SARS-CoV-2 by  FDA under an Emergency Use Authorization (EUA). This EUA will remain  in effect (meaning this test can be used) for the duration of the  Covid-19 declaration under Section 564(b)(1) of the Act, 21  U.S.C. section 360bbb-3(b)(1), unless the authorization is    terminated or revoked. Performed at [facility], [address] [place], [place] [address]   Blood culture (routine x 2)     Status: None (Preliminary result)   Collection Time: 07/08/19  5:40 PM   Specimen: Right  Antecubital; Blood  Result Value Ref Range Status   Specimen Description RIGHT ANTECUBITAL  Final   Special Requests   Final    BOTTLES DRAWN AEROBIC AND ANAEROBIC Blood Culture adequate volume   Culture   Final    NO GROWTH 3 DAYS Performed at [facility], [address], [place], [place] [address]    Report Status PENDING  Incomplete  Blood culture (routine x 2)     Status: None (Preliminary result)   Collection Time: 07/08/19  6:07 PM   Specimen: BLOOD RIGHT HAND  Result Value Ref Range Status   Specimen Description BLOOD RIGHT HAND  Final   Special Requests   Final    BOTTLES DRAWN AEROBIC ONLY Blood Culture adequate volume   Culture   Final    NO GROWTH 3 DAYS Performed at [facility], [address], [place], [place] [address]    Report Status PENDING  Incomplete  MRSA PCR Screening     Status: Abnormal   Collection Time: 07/08/19 10:12 PM   Specimen: Nasal Mucosa; Nasopharyngeal  Result Value Ref Range Status   MRSA by PCR POSITIVE (A) NEGATIVE Final    Comment:        The GeneXpert MRSA Assay (FDA approved for NASAL specimens only), is one component of a comprehensive MRSA colonization surveillance program. It is not intended to diagnose MRSA infection nor to guide or monitor treatment for MRSA infections. RESULT CALLED TO, READ BACK BY AND VERIFIED WITH: J RHEW,RN @0004  07/09/19 [facility] Performed at [facility], [address] [place], [place] [address]   Culture, Urine     Status: None   Collection Time: 07/09/19  2:05 AM   Specimen: Urine, Catheterized  Result Value Ref Range Status   Specimen Description   Final    URINE, CATHETERIZED Performed at [facility], [address] [place], [place] [address]    Special Requests   Final     NONE Performed at [facility], [address], [place], [place] [address]    Culture   Final    NO GROWTH Performed at [facility], [place] [address] [place],  [address]    Report Status 07/10/2019 FINAL  Final     Studies: US Abdomen Complete  Result Date: 07/09/2019 CLINICAL DATA:  Abnormal gallbladder on CT.  Denies abdominal pain. EXAM: ABDOMEN ULTRASOUND COMPLETE COMPARISON:  CT abdomen pelvis from same day. FINDINGS: Gallbladder: 3.6 cm gallstone in the gallbladder neck. Sludge. Diffuse gallbladder wall thickening measuring up to 1 cm. No sonographic Murphy sign noted by sonographer. Common bile duct: Diameter: 5 mm, normal. Liver: No focal lesion identified. Nodular contour with heterogeneous parenchymal echogenicity. Portal vein is patent on color Doppler imaging with normal direction of blood flow towards the liver. IVC: No abnormality visualized. Pancreas: Not visualized due to overlying bowel gas. Spleen: Size and appearance within normal limits. Right Kidney: Length: 11.6 cm. Echogenicity within normal limits. No mass or hydronephrosis visualized. Left Kidney: Length: 11.0 cm. Echogenicity within normal limits. No mass or hydronephrosis visualized. Abdominal aorta: Not visualized due to body habitus and overlying bowel gas. Other findings: Moderate ascites. IMPRESSION: 1. Cholelithiasis and diffuse gallbladder wall thickening. Findings are nonspecific and could be related to underlying liver disease or acute cholecystitis, though the absence of abdominal pain or positive sonographic Murphy sign argues against cholecystitis. Consider hepatobiliary scan for definitive evaluation. 2. Unchanged cirrhosis and moderate ascites. Electronically Signed   By: Titus Dubin M.D.   On:  07/09/2019 12:50   NM Hepato W/EF  Result Date: 07/10/2019 CLINICAL DATA:  Cholelithiasis, question cholecystitis EXAM: NUCLEAR MEDICINE HEPATOBILIARY IMAGING TECHNIQUE: Sequential images of the abdomen  were obtained out to 60 minutes following intravenous administration of radiopharmaceutical. Additional imaging was performed until 90 minutes. Delayed anterior and RIGHT lateral images were obtained 4 hours. Patient has not a history of allergy to codeine and does not know if she has an allergy to morphine; therefore morphine augmentation was not performed. RADIOPHARMACEUTICALS:  5.2 mCi Tc-44m Choletec IV COMPARISON:  Ultrasound abdomen 07/09/2019, CT abdomen 07/09/2019 FINDINGS: Prompt clearance of tracer from bloodstream indicating normal hepatocellular function. Prompt excretion of tracer into biliary tree. Small bowel visualized at 20 minutes. At 90 minutes gallbladder had not visualized. Delayed images at 4 hours demonstrate tracer activity over the chest and liver; the uptake over the liver appears too cranial for position of the gallbladder as identified on coronal CT images and I suspect both sites represents superimposed IV tubing, which is present laterally on the first hour of imaging. IMPRESSION: Nonvisualization of the gallbladder despite delayed imaging to 4 hours. Findings consistent with acute cholecystitis. Electronically Signed   By: MLavonia DanaM.D.   On: 07/10/2019 16:25   [place]oreaAbdomen Limited  Result Date: 07/09/2019 CLINICAL DATA:  Ascites EXAM: LIMITED ABDOMEN ULTRASOUND FOR ASCITES TECHNIQUE: Limited ultrasound survey for ascites was performed in all four abdominal quadrants. COMPARISON:  CT abdomen and pelvis 07/09/2019 FINDINGS: Survey imaging of the abdomen was performed for potential paracentesis. Scattered ascites is identified, majority deep in pelvis and perihepatic. No adequate pocket of ascites is identified at the flanks to allow access for adequate paracentesis. IMPRESSION: No adequate pocket of ascites for paracentesis. Electronically Signed   By: MLavonia DanaM.D.   On: 07/09/2019 14:52   [place]oreaVenous Img Lower Bilateral (DVT)  Result Date: 07/09/2019 CLINICAL DATA:  Bilateral  lower extremity edema and pain. EXAM: BILATERAL LOWER EXTREMITY VENOUS DOPPLER ULTRASOUND TECHNIQUE: Gray-scale sonography with graded compression, as well as color Doppler and duplex ultrasound were performed to evaluate the lower extremity deep venous systems from the level of the common femoral vein and including the common femoral, femoral, profunda femoral, popliteal and calf veins including the posterior tibial, peroneal and gastrocnemius veins when visible. The superficial great saphenous vein was also interrogated. Spectral Doppler was utilized to evaluate flow at rest and with distal augmentation maneuvers in the common femoral, femoral and popliteal veins. COMPARISON:  None. FINDINGS: RIGHT LOWER EXTREMITY Common Femoral Vein: No evidence of thrombus. Normal compressibility, respiratory phasicity and response to augmentation. Saphenofemoral Junction: No evidence of thrombus. Normal compressibility and flow on color Doppler imaging. Profunda Femoral Vein: No evidence of thrombus. Normal compressibility and flow on color Doppler imaging. Femoral Vein: No evidence of thrombus. Normal compressibility, respiratory phasicity and response to augmentation. Popliteal Vein: No evidence of thrombus. Normal compressibility, respiratory phasicity and response to augmentation. Calf Veins: Extremely limited visualization of calf veins. Superficial Great Saphenous Vein: No evidence of thrombus. Normal compressibility. Venous Reflux:  None. Other Findings: No evidence of superficial thrombophlebitis or abnormal fluid collection. LEFT LOWER EXTREMITY Common Femoral Vein: No evidence of thrombus. Normal compressibility, respiratory phasicity and response to augmentation. Saphenofemoral Junction: No evidence of thrombus. Normal compressibility and flow on color Doppler imaging. Profunda Femoral Vein: No evidence of thrombus. Normal compressibility and flow on color Doppler imaging. Femoral Vein: No evidence of thrombus.  Normal compressibility, respiratory phasicity and response to augmentation. Popliteal Vein: No evidence  of thrombus. Normal compressibility, respiratory phasicity and response to augmentation. Calf Veins: Extremely limited visualization of calf veins. Superficial Great Saphenous Vein: No evidence of thrombus. Normal compressibility. Venous Reflux:  None. Other Findings: No evidence of superficial thrombophlebitis or abnormal fluid collection. IMPRESSION: No evidence of deep venous thrombosis in either lower extremity. Evaluation of bilateral calf veins was extremely limited due to body habitus and edema. Electronically Signed   By: Aletta Edouard M.D.   On: 07/09/2019 12:57   Scheduled Meds: . Chlorhexidine Gluconate Cloth  6 each Topical Q0600  . diltiazem  300 mg Oral Daily  . furosemide  60 mg Oral Daily  . insulin aspart  0-5 Units Subcutaneous QHS  . insulin aspart  0-9 Units Subcutaneous TID WC  . insulin glargine  5 Units Subcutaneous QHS  . mouth rinse  15 mL Mouth Rinse q12n4p  . mupirocin ointment  1 application Nasal BID   Continuous Infusions: . sodium chloride 500 mL (07/10/19 1638)  . ceFEPime (MAXIPIME) IV 2 g (07/11/19 0831)  . heparin 1,000 Units/hr (07/11/19 1006)  . magnesium sulfate bolus IVPB 4 g (07/11/19 0852)  . metronidazole 500 mg (07/11/19 0900)  . potassium chloride 10 mEq (07/11/19 0926)  . vancomycin 1,500 mg (07/10/19 1855)    Principal Problem:   Diastolic congestive heart failure (HCC) Active Problems:   Essential hypertension   Unspecified atrial fibrillation (HCC)   Atrial fibrillation (HCC)   Type 2 diabetes mellitus (HCC)   Generalized weakness   Breast cancer (HCC)   Pressure injury of skin   Liver cirrhosis secondary to NASH (nonalcoholic steatohepatitis) ([place])   Ascites   Cholelithiasis with gallbladder wall thickening   Chronic anticoagulation   Palliative care by specialist  Time spent:   Irwin Brakeman, MD [facility] 07/11/2019, 10:50 AM    LOS: 3 days  How to contact the [facility] Attending or Consulting provider [place] or covering provider during after hours [place], for this patient?  1. Check the care team in [facility] and look for a) attending/consulting [facility] provider listed and b) the [facility] team listed 2. Log into [email or web address] and use 's universal password to access. If you do not have the password, please contact the hospital operator. 3. Locate the [facility] provider you are looking for under [facility] and page to a number that you can be directly reached. 4. If you still have difficulty reaching the provider, please page the [facility] (Director on Call) for the Hospitalists listed on amion for assistance.

## 2019-07-11 NOTE — Progress Notes (Signed)
PTT > 200.  Stopped heparin drip at 0740 and contacted Dr. Wynetta Emery and Remo Lipps in pharmacy.

## 2019-07-11 NOTE — Progress Notes (Signed)
ANTICOAGULATION CONSULT NOTE -   Pharmacy Consult for apixaban Indication: atrial fibrillation  Allergies  Allergen Reactions  . Levaquin [Levofloxacin] Other (See Comments)    Joint "seize" and constipation  . Codeine Other (See Comments)    Severe headache    Patient Measurements: Height: 5' 6"  (167.6 cm) Weight: 280 lb 3.3 oz (127.1 kg) IBW/kg (Calculated) : 59.3 Heparin Dosing Weight: HEPARIN DW (KG): 91   Vital Signs: Temp: 97.5 F (36.4 C) (02/05 0536) Temp Source: Oral (02/05 0536) BP: 138/68 (02/05 0536) Pulse Rate: 104 (02/05 0536)  Labs: Recent Labs    07/09/19 0540 07/09/19 0540 07/09/19 1240 07/10/19 0414 07/11/19 0540  HGB 8.1*   < >  --  7.5* 7.6*  HCT 26.8*  --   --  25.0* 24.8*  PLT 132*  --   --  108* 120*  APTT  --   --   --   --  >200*  HEPARINUNFRC  --   --   --   --  >2.20*  CREATININE 1.33*   < > 1.42* 1.21* 1.08*   < > = values in this interval not displayed.    Estimated Creatinine Clearance: 65.2 mL/min (A) (by C-G formula based on SCr of 1.08 mg/dL (H)).   Medical History: Past Medical History:  Diagnosis Date  . Anemia    in the past   . Anxiety   . Atrial fibrillation ([place])    a. diagnosed in 02/2018.  . Bronchitis    easily  develops bronchitis when she has a cold  . Cancer ([place])    L breast - CA, Feb. 4th   . Cellulitis   . Chronic kidney disease   . Diabetes mellitus without complication ([place])    type 2  . Dyspnea   . Dysrhythmia    afib, treating with Eliquis  . Family history of breast cancer   . Family history of uterine cancer   . Heart murmur   . Hyperlipidemia   . Hypertension     Medications:  Medications Prior to Admission  Medication Sig Dispense Refill Last Dose  . acetaminophen (TYLENOL) 500 MG tablet Take 1,000 mg by mouth every 6 (six) hours as needed for moderate pain or headache.   Past Week at Unknown time  . albuterol (PROVENTIL HFA;VENTOLIN HFA) 108 (90 Base) MCG/ACT inhaler Inhale 2 puffs  into the lungs every 6 (six) hours as needed for wheezing or shortness of breath.   07/08/2019 at Unknown time  . apixaban (ELIQUIS) 5 MG TABS tablet Take 1 tablet (5 mg total) by mouth 2 (two) times daily. 60 tablet 0 07/08/2019 at 730  . cholestyramine (QUESTRAN) 4 g packet Take 1 packet (4 g total) by mouth 3 (three) times daily with meals. (Patient taking differently: Take 4 g by mouth daily as needed. ) 60 each 12 Past Month at Unknown time  . CINNAMON PO Take 1 tablet by mouth daily.   07/08/2019 at Unknown time  . diltiazem (CARDIZEM CD) 300 MG 24 hr capsule Take 1 capsule (300 mg total) by mouth daily. 90 capsule 3 07/08/2019 at Unknown time  . diphenoxylate-atropine (LOMOTIL) 2.5-0.025 MG tablet TAKE 1 TABLET BY MOUTH 4 TIMES DAILY AS NEEDED FOR DIARRHEA OR  LOOSE  STOOLS (Patient taking differently: Take 1 tablet by mouth 4 (four) times daily as needed for diarrhea or loose stools. ) 120 tablet 0 07/08/2019 at Unknown time  . doxycycline (VIBRA-TABS) 100 MG tablet Take 1 tablet (100  mg total) by mouth 2 (two) times daily. 60 tablet 3 07/08/2019 at Unknown time  . furosemide (LASIX) 40 MG tablet Take 1.5 tablets (60 mg total) by mouth daily. 135 tablet 3 07/07/2019 at Unknown time  . insulin detemir (LEVEMIR) 100 UNIT/ML injection Inject 0.11 mLs (11 Units total) into the skin at bedtime. (Patient taking differently: Inject 8 Units into the skin at bedtime. )  0 07/07/2019 at Unknown time  . LORazepam (ATIVAN) 1 MG tablet Take 1 tablet 30 min before radiotherapy, PRN anxiety and claustrophobia. (Patient taking differently: Take 1 mg by mouth daily as needed for anxiety. ) 30 tablet 3 Past Week at Unknown time  . Magnesium Oxide 400 MG CAPS Take 1 capsule (400 mg total) by mouth daily. 90 capsule 3 07/08/2019 at Unknown time  . metFORMIN (GLUCOPHAGE) 500 MG tablet Take 1,000 mg by mouth 2 (two) times daily with a meal.    07/08/2019 at Unknown time  . niacin 500 MG tablet Take 500 mg by mouth daily.   07/08/2019 at  Unknown time  . NYSTATIN powder Apply 1 Bottle topically daily as needed (irritation).    07/08/2019 at Unknown time  . Omega-3 1000 MG CAPS Take 1,000 mg by mouth 2 (two) times daily.   07/08/2019 at Unknown time  . ondansetron (ZOFRAN) 8 MG tablet Take 1 tablet (8 mg total) by mouth 2 (two) times daily as needed (Nausea or vomiting). 30 tablet 1 07/08/2019 at Unknown time  . pantoprazole (PROTONIX) 40 MG tablet Take 1 tablet by mouth once daily (Patient taking differently: Take 40 mg by mouth daily. ) 90 tablet 0 07/08/2019 at Unknown time  . potassium chloride SA (KLOR-CON) 20 MEQ tablet Take 1 tablet (20 mEq total) by mouth 2 (two) times daily. 60 tablet 2 07/08/2019 at Unknown time  . pravastatin (PRAVACHOL) 20 MG tablet Take 20 mg by mouth daily before breakfast.   0 07/08/2019 at Unknown time  . prochlorperazine (COMPAZINE) 10 MG tablet Take 1 tablet (10 mg total) by mouth every 6 (six) hours as needed (Nausea or vomiting). 30 tablet 1 Past Week at Unknown time  . traMADol (ULTRAM) 50 MG tablet Take 1 tablet (50 mg total) by mouth every 6 (six) hours as needed. (Patient taking differently: Take 50 mg by mouth every 6 (six) hours as needed for moderate pain. ) 20 tablet 0 Past Week at Unknown time   Scheduled:  . Chlorhexidine Gluconate Cloth  6 each Topical Q0600  . diltiazem  300 mg Oral Daily  . furosemide  60 mg Oral Daily  . insulin aspart  0-5 Units Subcutaneous QHS  . insulin aspart  0-9 Units Subcutaneous TID WC  . insulin glargine  5 Units Subcutaneous QHS  . mouth rinse  15 mL Mouth Rinse q12n4p  . megestrol  400 mg Oral BID  . mupirocin ointment  1 application Nasal BID   Infusions:  . sodium chloride 500 mL (07/10/19 1638)  . ceFEPime (MAXIPIME) IV 2 g (07/11/19 0831)  . metronidazole 500 mg (07/11/19 0900)  . potassium chloride 10 mEq (07/11/19 1104)   PRN: sodium chloride, acetaminophen **OR** acetaminophen, albuterol, ondansetron **OR** ondansetron (ZOFRAN) IV, polyethylene  glycol, sodium chloride flush Anti-infectives (From admission, onward)   Start     Dose/Rate Route Frequency Ordered Stop   07/11/19 0900  ceFEPIme (MAXIPIME) 2 g in sodium chloride 0.9 % 100 mL IVPB     2 g 200 mL/hr over 30 Minutes Intravenous Every 8  hours 07/11/19 0813     07/09/19 1800  vancomycin (VANCOREADY) IVPB 1250 mg/250 mL  Status:  Discontinued     1,250 mg 166.7 mL/hr over 90 Minutes Intravenous Every 24 hours 07/08/19 1755 07/09/19 0847   07/09/19 1800  vancomycin (VANCOREADY) IVPB 1500 mg/300 mL  Status:  Discontinued     1,500 mg 150 mL/hr over 120 Minutes Intravenous Every 24 hours 07/09/19 0847 07/11/19 1053   07/09/19 0500  ceFEPIme (MAXIPIME) 2 g in sodium chloride 0.9 % 100 mL IVPB  Status:  Discontinued     2 g 200 mL/hr over 30 Minutes Intravenous Every 12 hours 07/08/19 1752 07/11/19 0813   07/09/19 0200  metroNIDAZOLE (FLAGYL) IVPB 500 mg     500 mg 100 mL/hr over 60 Minutes Intravenous Every 8 hours 07/08/19 2158     07/08/19 1800  vancomycin (VANCOREADY) IVPB 2000 mg/400 mL     2,000 mg 200 mL/hr over 120 Minutes Intravenous  Once 07/08/19 1750 07/08/19 2129   07/08/19 1745  ceFEPIme (MAXIPIME) 2 g in sodium chloride 0.9 % 100 mL IVPB     2 g 200 mL/hr over 30 Minutes Intravenous  Once 07/08/19 1740 07/08/19 1859   07/08/19 1745  metroNIDAZOLE (FLAGYL) IVPB 500 mg     500 mg 100 mL/hr over 60 Minutes Intravenous  Once 07/08/19 1740 07/08/19 1931   07/08/19 1745  vancomycin (VANCOCIN) IVPB 1000 mg/200 mL premix  Status:  Discontinued     1,000 mg 200 mL/hr over 60 Minutes Intravenous  Once 07/08/19 1740 07/08/19 1750      Assessment: Destiny Nash a 74 y.o. female requires anticoagulation with a heparin iv infusion for the indication of  atrial fibrillation.  No plan of any procedures per surgery- restart home apixaban  Goal of Therapy:   Monitor platelets by anticoagulation protocol: Yes   Plan:  Stop heparin infusion. Start apixaban 5 mg  twice daily.. Continue to monitor H&H and platelets.  Margot Ables, PharmD Clinical Pharmacist 07/11/2019 11:53 AM

## 2019-07-11 NOTE — Progress Notes (Signed)
CRITICAL VALUE ALERT  Critical Value:  Potassium 2.5  Date & Time Notied:  07/11/19 4103  Provider Notified: Dr. Darrick Meigs  Orders Received/Actions taken: No new orders at this time

## 2019-07-11 NOTE — Progress Notes (Signed)
Patient's daughter(Beth) called regarding [employer].  Advised her mom had been approved and receiving gas cards from the grant. Sent a copy of the expense sheet via email to the daughter along with a copy of the approval letter signed by the patient.  She has my contact information for any additional financial questions or concerns.

## 2019-07-12 ENCOUNTER — Ambulatory Visit (HOSPITAL_COMMUNITY): Payer: Medicare Other

## 2019-07-12 ENCOUNTER — Inpatient Hospital Stay (HOSPITAL_COMMUNITY): Payer: Medicare Other

## 2019-07-12 DIAGNOSIS — R748 Abnormal levels of other serum enzymes: Secondary | ICD-10-CM

## 2019-07-12 DIAGNOSIS — R0600 Dyspnea, unspecified: Secondary | ICD-10-CM

## 2019-07-12 DIAGNOSIS — E875 Hyperkalemia: Secondary | ICD-10-CM

## 2019-07-12 DIAGNOSIS — R932 Abnormal findings on diagnostic imaging of liver and biliary tract: Secondary | ICD-10-CM

## 2019-07-12 DIAGNOSIS — K8020 Calculus of gallbladder without cholecystitis without obstruction: Secondary | ICD-10-CM

## 2019-07-12 DIAGNOSIS — N179 Acute kidney failure, unspecified: Secondary | ICD-10-CM

## 2019-07-12 LAB — CBC WITH DIFFERENTIAL/PLATELET
Abs Immature Granulocytes: 0.09 10*3/uL — ABNORMAL HIGH (ref 0.00–0.07)
Basophils Absolute: 0.1 10*3/uL (ref 0.0–0.1)
Basophils Relative: 0 %
Eosinophils Absolute: 0.1 10*3/uL (ref 0.0–0.5)
Eosinophils Relative: 1 %
HCT: 28.1 % — ABNORMAL LOW (ref 36.0–46.0)
Hemoglobin: 8.3 g/dL — ABNORMAL LOW (ref 12.0–15.0)
Immature Granulocytes: 1 %
Lymphocytes Relative: 8 %
Lymphs Abs: 0.9 10*3/uL (ref 0.7–4.0)
MCH: 22.2 pg — ABNORMAL LOW (ref 26.0–34.0)
MCHC: 29.5 g/dL — ABNORMAL LOW (ref 30.0–36.0)
MCV: 75.1 fL — ABNORMAL LOW (ref 80.0–100.0)
Monocytes Absolute: 1.5 10*3/uL — ABNORMAL HIGH (ref 0.1–1.0)
Monocytes Relative: 13 %
Neutro Abs: 8.9 10*3/uL — ABNORMAL HIGH (ref 1.7–7.7)
Neutrophils Relative %: 77 %
Platelets: 121 10*3/uL — ABNORMAL LOW (ref 150–400)
RBC: 3.74 MIL/uL — ABNORMAL LOW (ref 3.87–5.11)
RDW: 25.7 % — ABNORMAL HIGH (ref 11.5–15.5)
WBC: 11.5 10*3/uL — ABNORMAL HIGH (ref 4.0–10.5)
nRBC: 0.5 % — ABNORMAL HIGH (ref 0.0–0.2)

## 2019-07-12 LAB — GLUCOSE, CAPILLARY
Glucose-Capillary: 189 mg/dL — ABNORMAL HIGH (ref 70–99)
Glucose-Capillary: 228 mg/dL — ABNORMAL HIGH (ref 70–99)
Glucose-Capillary: 222 mg/dL — ABNORMAL HIGH (ref 70–99)
Glucose-Capillary: 213 mg/dL — ABNORMAL HIGH (ref 70–99)

## 2019-07-12 LAB — COMPREHENSIVE METABOLIC PANEL
ALT: 22 U/L (ref 0–44)
AST: 51 U/L — ABNORMAL HIGH (ref 15–41)
Albumin: 2.5 g/dL — ABNORMAL LOW (ref 3.5–5.0)
Alkaline Phosphatase: 208 U/L — ABNORMAL HIGH (ref 38–126)
Anion gap: 9 (ref 5–15)
BUN: 34 mg/dL — ABNORMAL HIGH (ref 8–23)
CO2: 26 mmol/L (ref 22–32)
Calcium: 8.4 mg/dL — ABNORMAL LOW (ref 8.9–10.3)
Chloride: 100 mmol/L (ref 98–111)
Creatinine, Ser: 1.05 mg/dL — ABNORMAL HIGH (ref 0.44–1.00)
GFR calc Af Amer: >60 mL/min (ref >60)
GFR calc non Af Amer: 53 mL/min — ABNORMAL LOW (ref >60)
Glucose, Bld: 202 mg/dL — ABNORMAL HIGH (ref 70–99)
Potassium: 2.7 mmol/L — CL (ref 3.5–5.1)
Sodium: 135 mmol/L (ref 135–145)
Total Bilirubin: 1.5 mg/dL — ABNORMAL HIGH (ref 0.3–1.2)
Total Protein: 6.4 g/dL — ABNORMAL LOW (ref 6.5–8.1)

## 2019-07-12 LAB — MAGNESIUM: Magnesium: 1.8 mg/dL (ref 1.7–2.4)

## 2019-07-12 MED ORDER — POTASSIUM CHLORIDE CRYS ER 20 MEQ PO TBCR
60.0000 meq | EXTENDED_RELEASE_TABLET | Freq: Once | ORAL | Status: AC
  Administered 2019-07-12: 60 meq via ORAL
  Filled 2019-07-12: qty 3

## 2019-07-12 MED ORDER — INSULIN GLARGINE 100 UNIT/ML ~~LOC~~ SOLN
8.0000 [IU] | Freq: Every day | SUBCUTANEOUS | Status: DC
  Administered 2019-07-12 – 2019-07-13 (×2): 8 [IU] via SUBCUTANEOUS
  Filled 2019-07-12 (×3): qty 0.08

## 2019-07-12 MED ORDER — PRAVASTATIN SODIUM 10 MG PO TABS
20.0000 mg | ORAL_TABLET | Freq: Every day | ORAL | Status: DC
  Administered 2019-07-12 – 2019-07-13 (×2): 20 mg via ORAL
  Filled 2019-07-12 (×2): qty 2

## 2019-07-12 MED ORDER — INSULIN ASPART 100 UNIT/ML ~~LOC~~ SOLN
2.0000 [IU] | Freq: Three times a day (TID) | SUBCUTANEOUS | Status: DC
  Administered 2019-07-12 – 2019-07-13 (×4): 2 [IU] via SUBCUTANEOUS

## 2019-07-12 MED ORDER — POTASSIUM CHLORIDE CRYS ER 20 MEQ PO TBCR
20.0000 meq | EXTENDED_RELEASE_TABLET | Freq: Two times a day (BID) | ORAL | Status: DC
  Administered 2019-07-12 – 2019-07-13 (×2): 20 meq via ORAL
  Filled 2019-07-12 (×2): qty 1

## 2019-07-12 MED ORDER — DIPHENOXYLATE-ATROPINE 2.5-0.025 MG PO TABS: 1.0000 | ORAL_TABLET | Freq: Four times a day (QID) | ORAL | Status: DC | PRN

## 2019-07-12 NOTE — Progress Notes (Signed)
Subjective: Patient denies any abdominal pain, nausea, or vomiting.  She did eat some solid food this morning.  She states it tasted good.  Objective: Vital signs in last 24 hours: Temp:  [97.5 F (36.4 C)-97.8 F (36.6 C)] 97.5 F (36.4 C) (02/06 0600) Pulse Rate:  [79-80] 80 (02/06 0600) Resp:  [18] 18 (02/06 0600) BP: (130-142)/(51-73) 142/73 (02/06 0859) SpO2:  [92 %-100 %] 100 % (02/06 0751) Weight:  [128.7 kg] 128.7 kg (02/06 0429) Last BM Date: 07/10/19  Intake/Output from previous day: 02/05 0701 - 02/06 0700 In: 1477.2 [P.O.:720; I.V.:165.1; IV Piggyback:592.1] Out: 1350 [Urine:1350] Intake/Output this shift: Total I/O In: 240 [P.O.:240] Out: -   General appearance: alert, cooperative and no distress GI: soft, non-tender; bowel sounds normal; no masses,  no organomegaly  Lab Results:  Recent Labs    07/11/19 0540 07/12/19 0632  WBC 10.9* 11.5*  HGB 7.6* 8.3*  HCT 24.8* 28.1*  PLT 120* 121*   BMET Recent Labs    07/11/19 0540 07/12/19 0632  NA 135 135  K 2.5* 2.7*  CL 100 100  CO2 25 26  GLUCOSE 169* 202*  BUN 36* 34*  CREATININE 1.08* 1.05*  CALCIUM 8.0* 8.4*   PT/INR No results for input(s): LABPROT, INR in the last 72 hours.  Studies/Results: NM Hepato W/EF  Result Date: 07/10/2019 CLINICAL DATA:  Cholelithiasis, question cholecystitis EXAM: NUCLEAR MEDICINE HEPATOBILIARY IMAGING TECHNIQUE: Sequential images of the abdomen were obtained out to 60 minutes following intravenous administration of radiopharmaceutical. Additional imaging was performed until 90 minutes. Delayed anterior and RIGHT lateral images were obtained 4 hours. Patient has not a history of allergy to codeine and does not know if she has an allergy to morphine; therefore morphine augmentation was not performed. RADIOPHARMACEUTICALS:  5.2 mCi Tc-43m Choletec IV COMPARISON:  Ultrasound abdomen 07/09/2019, CT abdomen 07/09/2019 FINDINGS: Prompt clearance of tracer from bloodstream  indicating normal hepatocellular function. Prompt excretion of tracer into biliary tree. Small bowel visualized at 20 minutes. At 90 minutes gallbladder had not visualized. Delayed images at 4 hours demonstrate tracer activity over the chest and liver; the uptake over the liver appears too cranial for position of the gallbladder as identified on coronal CT images and I suspect both sites represents superimposed IV tubing, which is present laterally on the first hour of imaging. IMPRESSION: Nonvisualization of the gallbladder despite delayed imaging to 4 hours. Findings consistent with acute cholecystitis. Electronically Signed   By: MLavonia DanaM.D.   On: 07/10/2019 16:25    Anti-infectives: Anti-infectives (From admission, onward)   Start     Dose/Rate Route Frequency Ordered Stop   07/11/19 0900  ceFEPIme (MAXIPIME) 2 g in sodium chloride 0.9 % 100 mL IVPB     2 g 200 mL/hr over 30 Minutes Intravenous Every 8 hours 07/11/19 0813     07/09/19 1800  vancomycin (VANCOREADY) IVPB 1250 mg/250 mL  Status:  Discontinued     1,250 mg 166.7 mL/hr over 90 Minutes Intravenous Every 24 hours 07/08/19 1755 07/09/19 0847   07/09/19 1800  vancomycin (VANCOREADY) IVPB 1500 mg/300 mL  Status:  Discontinued     1,500 mg 150 mL/hr over 120 Minutes Intravenous Every 24 hours 07/09/19 0847 07/11/19 1053   07/09/19 0500  ceFEPIme (MAXIPIME) 2 g in sodium chloride 0.9 % 100 mL IVPB  Status:  Discontinued     2 g 200 mL/hr over 30 Minutes Intravenous Every 12 hours 07/08/19 1752 07/11/19 0813   07/09/19 0200  metroNIDAZOLE (FLAGYL) IVPB 500 mg     500 mg 100 mL/hr over 60 Minutes Intravenous Every 8 hours 07/08/19 2158     07/08/19 1800  vancomycin (VANCOREADY) IVPB 2000 mg/400 mL     2,000 mg 200 mL/hr over 120 Minutes Intravenous  Once 07/08/19 1750 07/08/19 2129   07/08/19 1745  ceFEPIme (MAXIPIME) 2 g in sodium chloride 0.9 % 100 mL IVPB     2 g 200 mL/hr over 30 Minutes Intravenous  Once 07/08/19 1740  07/08/19 1859   07/08/19 1745  metroNIDAZOLE (FLAGYL) IVPB 500 mg     500 mg 100 mL/hr over 60 Minutes Intravenous  Once 07/08/19 1740 07/08/19 1931   07/08/19 1745  vancomycin (VANCOCIN) IVPB 1000 mg/200 mL premix  Status:  Discontinued     1,000 mg 200 mL/hr over 60 Minutes Intravenous  Once 07/08/19 1740 07/08/19 1750      Assessment/Plan: Impression: Cholelithiasis, asymptomatic at this point.  No need for cholecystostomy tube at this time.  Patient still with hypokalemia.  Agree with continuing Megace that was started yesterday.    LOS: 4 days    Aviva Signs 07/12/2019

## 2019-07-12 NOTE — Progress Notes (Signed)
PROGRESS NOTE Destiny Nash   Destiny Nash  LTJ:[id number]  DOB: [birth year]  DOA: 07/08/2019 PCP: [name], MD  Brief Admission Hx: 74 y.o. female with medical history significant for breast cancer on active chemotherapy, diabetes mellitus, hypertension, atrial fibrillation.  Patient presented to the ED with complaints of generalized weakness over the past several days.  She had been started on treatment outpatient for a LE cellulitis.  She is having increasing abdominal distension as well.   She was noted to be tachycardic and had a lactic acidosis on arrival.  MDM/Assessment & Plan:   1. Hypokalemia-additional replacement today ordered.  Repleted low magnesium.  Continue cardiac monitoring.  2. Mild diastolic CHF exacerbation-treated with IV Lasix.  Resumed home oral Lasix 60 mg daily.  3. Hyponatremia-suspect secondary to diuretics.  Improved.   4. Liver cirrhosis-noted on CT scan.  Could this be from chemotherapy for breast cancer?  She has moderate ascites and is symptomatic and requested ultrasound paracentesis but there was not enough fluid there for removal.  5. Generalized weakness-multifactorial.  PT recommending SNF.  TOC consulted for placement.  6. Sepsis secondary to cellulitis-RESOLVED.  She was initially started on broad-spectrum antibiotics IV but now de-escalated.   Continue to monitor and supportive care ordered.  Lactate is down.  De-escalated antibiotics 07/11/19.  7. Elevated transaminases-suspect secondary to liver cirrhosis.  Following.  See ultrasound abdomen. 8. 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  CBG testing ordered.  A1c 9.1 which is poorly controlled disease.  Hold home Metformin. 9. Essential hypertension-treated with Cardizem which has been restarted. 10. Stage Ib breast cancer diagnosed in 2/20-followed with Dr. Lindi Adie.   Status post left lumpectomy, adjuvant radiation and adjuvant antiestrogens.  She is on chemotherapy every 3 weeks with kadcyla.  Follow up witih Dr. Lindi Adie.   Pt has progressively declined since diagnosis and I have asked for palliative consult to assist with goals of care.  They were not able to see 2/4 and unavailable until 2/9.  11. Atrial fibrillation - currently heart rate is controlled.  She is fully anticoagulated with apixaban.   12. Cholelithiasis with thickened gallbladder walls - HIDA scan suggested acute cholecystitis - Dr. Arnoldo Morale consulted.  Pt doesn't have abdominal pain and tolerating diet.  Monitor for now.  Does not require cholecystostomy tube at this time.  Pt poor surgical candidate.   DVT prophylaxis: apixaban Code Status: Full  Family Communication: daughter Disposition Plan: continue inpatient management for IV antibiotics, advancing diet,  SNF   Consultants:  Surgery Arnoldo Morale)  Procedures:    Antimicrobials:   Anti-infectives (From admission, onward)   Start     Dose/Rate Route Frequency Ordered Stop   07/11/19 0900  ceFEPIme (MAXIPIME) 2 g in sodium chloride 0.9 % 100 mL IVPB     2 g 200 mL/hr over 30 Minutes Intravenous Every 8 hours 07/11/19 0813     07/09/19 1800  vancomycin (VANCOREADY) IVPB 1250 mg/250 mL  Status:  Discontinued     1,250 mg 166.7 mL/hr over 90 Minutes Intravenous Every 24 hours 07/08/19 1755 07/09/19 0847   07/09/19 1800  vancomycin (VANCOREADY) IVPB 1500 mg/300 mL  Status:  Discontinued     1,500 mg 150 mL/hr over 120 Minutes Intravenous Every 24 hours 07/09/19 0847 07/11/19 1053   07/09/19 0500  ceFEPIme (MAXIPIME) 2 g in sodium chloride 0.9 % 100 mL IVPB  Status:  Discontinued  2 g 200 mL/hr over 30 Minutes Intravenous Every 12 hours 07/08/19 1752 07/11/19 0813   07/09/19 0200  metroNIDAZOLE (FLAGYL) IVPB 500 mg     500 mg 100 mL/hr over 60 Minutes Intravenous Every 8 hours 07/08/19 2158     07/08/19 1800  vancomycin  (VANCOREADY) IVPB 2000 mg/400 mL     2,000 mg 200 mL/hr over 120 Minutes Intravenous  Once 07/08/19 1750 07/08/19 2129   07/08/19 1745  ceFEPIme (MAXIPIME) 2 g in sodium chloride 0.9 % 100 mL IVPB     2 g 200 mL/hr over 30 Minutes Intravenous  Once 07/08/19 1740 07/08/19 1859   07/08/19 1745  metroNIDAZOLE (FLAGYL) IVPB 500 mg     500 mg 100 mL/hr over 60 Minutes Intravenous  Once 07/08/19 1740 07/08/19 1931   07/08/19 1745  vancomycin (VANCOCIN) IVPB 1000 mg/200 mL premix  Status:  Discontinued     1,000 mg 200 mL/hr over 60 Minutes Intravenous  Once 07/08/19 1740 07/08/19 1750      Subjective: Pt had some shortness of breath earlier.  She reports improvement in leg pain and redness.      Objective: Vitals:   07/12/19 0429 07/12/19 0600 07/12/19 0751 07/12/19 0859  BP:  132/62  (!) 142/73  Pulse:  80    Resp:  18    Temp:  (!) 97.5 F (36.4 C)    TempSrc:      SpO2:  100% 100%   Weight: 128.7 kg     Height:        Intake/Output Summary (Last 24 hours) at 07/12/2019 1241 Last data filed at 07/12/2019 0900 Gross per 24 hour  Intake 1477.19 ml  Output 1350 ml  Net 127.19 ml   Filed Weights   07/09/19 0558 07/11/19 0500 07/12/19 0429  Weight: 129.3 kg 127.1 kg 128.7 kg   REVIEW OF SYSTEMS  As per history otherwise all reviewed and reported negative  Exam:  General exam: chronically ill appearing, awake, alert, NAD.  Respiratory system: BBS clear, no rales or crackles. No increased work of breathing. Cardiovascular system: irregularly irregular, normal S1 & S2 heard.  Gastrointestinal system: Abdomen is mildly distended, soft and mostly nontender. Normal bowel sounds heard.  Ascites present.  Central nervous system: Alert and oriented to person. No focal neurological deficits. Extremities: chronic venous stasis changes, 2+ edema BLEs. Resolved erythema LLL and improving erythema RLE.   Data Reviewed: Basic Metabolic Panel: Recent Labs  Lab 07/09/19 0540  07/09/19 0747 07/09/19 1240 07/10/19 0414 07/11/19 0540 07/12/19 0632  NA 131*  --  127* 136 135 135  K 6.0*  --  5.4* 3.8 2.5* 2.7*  CL 99  --  96* 100 100 100  CO2 23  --  20* 23 25 26   GLUCOSE 124*  --  102* 75 169* 202*  BUN 47*  --  48* 45* 36* 34*  CREATININE 1.33*  --  1.42* 1.21* 1.08* 1.05*  CALCIUM 8.6*  --  8.2* 8.6* 8.0* 8.4*  MG  --  1.0*  --  1.3* 1.5* 1.8   Liver Function Tests: Recent Labs  Lab 07/08/19 1527 07/09/19 0540 07/10/19 0414 07/11/19 0540 07/12/19 0632  AST 51* 53* 59* 53* 51*  ALT 23 22 24 21 22   ALKPHOS 218* 222* 219* 209* 208*  BILITOT 1.8* 1.8* 1.5* 1.5* 1.5*  PROT 6.6 6.3* 5.8* 5.9* 6.4*  ALBUMIN 2.7* 2.6* 2.5* 2.3* 2.5*   Recent Labs  Lab 07/08/19 1527  LIPASE 17  No results for input(s): AMMONIA in the last 168 hours. CBC: Recent Labs  Lab 07/08/19 1527 07/09/19 0540 07/10/19 0414 07/11/19 0540 07/12/19 0632  WBC 14.6* 15.4* 11.9* 10.9* 11.5*  NEUTROABS  --   --  9.8* 8.6* 8.9*  HGB 8.6* 8.1* 7.5* 7.6* 8.3*  HCT 28.7* 26.8* 25.0* 24.8* 28.1*  MCV 74.7* 74.7* 73.7* 74.3* 75.1*  PLT 136* 132* 108* 120* 121*   Cardiac Enzymes: No results for input(s): CKTOTAL, CKMB, CKMBINDEX, TROPONINI in the last 168 hours. CBG (last 3)  Recent Labs    07/11/19 2111 07/12/19 0713 07/12/19 1056  GLUCAP 224* 189* 228*   Recent Results (from the past 240 hour(s))  Respiratory Panel by RT PCR (Flu A&B, Covid) - Nasopharyngeal Swab     Status: None   Collection Time: 07/08/19  5:34 PM   Specimen: Nasopharyngeal Swab  Result Value Ref Range Status   SARS Coronavirus 2 by RT PCR NEGATIVE NEGATIVE Final    Comment: (NOTE) SARS-CoV-2 target nucleic acids are NOT DETECTED. The SARS-CoV-2 RNA is generally detectable in upper respiratoy specimens during the acute phase of infection. The lowest concentration of SARS-CoV-2 viral copies this assay can detect is 131 copies/mL. A negative result does not preclude SARS-Cov-2 infection and  should not be used as the sole basis for treatment or other patient management decisions. A negative result may occur with  improper specimen collection/handling, submission of specimen other than nasopharyngeal swab, presence of viral mutation(s) within the areas targeted by this assay, and inadequate number of viral copies (<131 copies/mL). A negative result must be combined with clinical observations, patient history, and epidemiological information. The expected result is Negative. Fact Sheet for Patients:  PinkCheek.be Fact Sheet for Healthcare Providers:  GravelBags.it This test is not yet ap proved or cleared by the [place] FDA and  has been authorized for detection and/or diagnosis of SARS-CoV-2 by FDA under an Emergency Use Authorization (EUA). This EUA will remain  in effect (meaning this test can be used) for the duration of the COVID-19 declaration under Section 564(b)(1) of the Act, 21 U.S.C. section 360bbb-3(b)(1), unless the authorization is terminated or revoked sooner.    Influenza A by PCR NEGATIVE NEGATIVE Final   Influenza B by PCR NEGATIVE NEGATIVE Final    Comment: (NOTE) The Xpert Xpress SARS-CoV-2/FLU/RSV assay is intended as an aid in  the diagnosis of influenza from Nasopharyngeal swab specimens and  should not be used as a sole basis for treatment. Nasal washings and  aspirates are unacceptable for Xpert Xpress SARS-CoV-2/FLU/RSV  testing. Fact Sheet for Patients: PinkCheek.be Fact Sheet for Healthcare Providers: GravelBags.it This test is not yet approved or cleared by the [place] FDA and  has been authorized for detection and/or diagnosis of SARS-CoV-2 by  FDA under an Emergency Use Authorization (EUA). This EUA will remain  in effect (meaning this test can be used) for the duration of the  Covid-19 declaration under Section  564(b)(1) of the Act, 21  U.S.C. section 360bbb-3(b)(1), unless the authorization is  terminated or revoked. Performed at [facility], [address] [place], [place] [address]   Blood culture (routine x 2)     Status: None (Preliminary result)   Collection Time: 07/08/19  5:40 PM   Specimen: Right Antecubital; Blood  Result Value Ref Range Status   Specimen Description RIGHT ANTECUBITAL  Final   Special Requests   Final    BOTTLES DRAWN AEROBIC AND ANAEROBIC Blood Culture adequate volume  Culture   Final    NO GROWTH 4 DAYS Performed at [facility], [address], [place], [place] [address]    Report Status PENDING  Incomplete  Blood culture (routine x 2)     Status: None (Preliminary result)   Collection Time: 07/08/19  6:07 PM   Specimen: BLOOD RIGHT HAND  Result Value Ref Range Status   Specimen Description BLOOD RIGHT HAND  Final   Special Requests   Final    BOTTLES DRAWN AEROBIC ONLY Blood Culture adequate volume   Culture   Final    NO GROWTH 4 DAYS Performed at [facility], [address] [place], [place] [address]    Report Status PENDING  Incomplete  MRSA PCR Screening     Status: Abnormal   Collection Time: 07/08/19 10:12 PM   Specimen: Nasal Mucosa; Nasopharyngeal  Result Value Ref Range Status   MRSA by PCR POSITIVE (A) NEGATIVE Final    Comment:        The GeneXpert MRSA Assay (FDA approved for NASAL specimens only), is one component of a comprehensive MRSA colonization surveillance program. It is not intended to diagnose MRSA infection nor to guide or monitor treatment for MRSA infections. RESULT CALLED TO, READ BACK BY AND VERIFIED WITH: J RHEW,RN @0004  07/09/19 [facility] Performed at [facility], [address], [place], [place] [address]   Culture, Urine     Status: None   Collection Time: 07/09/19  2:05 AM   Specimen: Urine, Catheterized  Result Value Ref Range Status   Specimen Description   Final    URINE, CATHETERIZED Performed at [facility], [address] [place], [place] [address]    Special Requests   Final    NONE Performed at [facility], [address] [place], [place] [address]    Culture   Final    NO GROWTH Performed at [facility], [place] [address] [place], [place] [address]    Report Status 07/10/2019 FINAL  Final     Studies: NM Hepato W/EF  Result Date: 07/10/2019 CLINICAL DATA:  Cholelithiasis, question cholecystitis EXAM: NUCLEAR MEDICINE HEPATOBILIARY IMAGING TECHNIQUE: Sequential images of the abdomen were obtained out to 60 minutes following intravenous administration of radiopharmaceutical. Additional imaging was performed until 90 minutes. Delayed anterior and RIGHT lateral images were obtained 4 hours. Patient has not a history of allergy to codeine and does not know if she has an allergy to morphine; therefore morphine augmentation was not performed. RADIOPHARMACEUTICALS:  5.2 mCi Tc-90m Choletec IV COMPARISON:  Ultrasound abdomen 07/09/2019, CT abdomen 07/09/2019 FINDINGS: Prompt clearance of tracer from bloodstream indicating normal hepatocellular function. Prompt excretion of tracer into biliary tree. Small bowel visualized at 20 minutes. At 90 minutes gallbladder had not visualized. Delayed images at 4 hours demonstrate tracer activity over the chest and liver; the uptake over the liver appears too cranial for position of the gallbladder as identified on coronal CT images and I suspect both sites represents superimposed IV tubing, which is present laterally on the first hour of imaging. IMPRESSION: Nonvisualization of the gallbladder despite delayed imaging to 4 hours. Findings consistent with acute cholecystitis. Electronically Signed   By: MLavonia DanaM.D.   On: 07/10/2019 16:25   DG CHEST PORT 1 VIEW  Result Date: 07/12/2019 CLINICAL DATA:  Dyspnea. EXAM: PORTABLE CHEST 1 VIEW COMPARISON:  July 08, 2019. FINDINGS: Stable cardiomediastinal silhouette. Mild central pulmonary vascular  congestion is noted with bibasilar opacities concerning for edema or possibly  inflammation. No pneumothorax is noted. Right internal jugular Port-A-Cath is unchanged in position. Small bilateral pleural effusions may be present. Bony thorax is unremarkable. IMPRESSION: Mild central pulmonary vascular congestion is noted with bibasilar opacities concerning for edema or possibly inflammation. Electronically Signed   By: Marijo Conception M.D.   On: 07/12/2019 11:41   Scheduled Meds: . apixaban  5 mg Oral BID  . Chlorhexidine Gluconate Cloth  6 each Topical Q0600  . diltiazem  300 mg Oral Daily  . furosemide  60 mg Oral Daily  . insulin aspart  0-5 Units Subcutaneous QHS  . insulin aspart  0-9 Units Subcutaneous TID WC  . insulin glargine  5 Units Subcutaneous QHS  . mouth rinse  15 mL Mouth Rinse q12n4p  . megestrol  400 mg Oral BID  . mupirocin ointment  1 application Nasal BID  . potassium chloride SA  20 mEq Oral BID  . pravastatin  20 mg Oral q1800   Continuous Infusions: . sodium chloride 500 mL (07/10/19 1638)  . ceFEPime (MAXIPIME) IV 2 g (07/12/19 0855)  . metronidazole 500 mg (07/12/19 0856)    Principal Problem:   Diastolic congestive heart failure (HCC) Active Problems:   Essential hypertension   Unspecified atrial fibrillation (HCC)   Atrial fibrillation (HCC)   Type 2 diabetes mellitus (HCC)   Generalized weakness   Breast cancer (HCC)   Pressure injury of skin   Liver cirrhosis secondary to NASH (nonalcoholic steatohepatitis) ([place])   Ascites   Cholelithiasis with gallbladder wall thickening   Chronic anticoagulation   Palliative care by specialist  Time spent:   Irwin Brakeman, MD [facility] 07/12/2019, 12:41 PM    LOS: 4 days  How to contact the [facility] Attending or Consulting provider [place] or covering provider during after hours [place], for this patient?  1. Check the care team in [facility] and look for a) attending/consulting [facility] provider listed and b) the  [facility] team listed 2. Log into [email or web address] and use [facility]'s universal password to access. If you do not have the password, please contact the hospital operator. 3. Locate the [facility] provider you are looking for under [facility] and page to a number that you can be directly reached. 4. If you still have difficulty reaching the provider, please page the [facility] (Director on Call) for the Hospitalists listed on amion for assistance.

## 2019-07-13 LAB — CBC WITH DIFFERENTIAL/PLATELET
Abs Immature Granulocytes: 0.09 10*3/uL — ABNORMAL HIGH (ref 0.00–0.07)
Basophils Absolute: 0 10*3/uL (ref 0.0–0.1)
Basophils Relative: 0 %
Eosinophils Absolute: 0.2 10*3/uL (ref 0.0–0.5)
Eosinophils Relative: 2 %
HCT: 26.9 % — ABNORMAL LOW (ref 36.0–46.0)
Hemoglobin: 8.1 g/dL — ABNORMAL LOW (ref 12.0–15.0)
Immature Granulocytes: 1 %
Lymphocytes Relative: 8 %
Lymphs Abs: 0.8 10*3/uL (ref 0.7–4.0)
MCH: 22.4 pg — ABNORMAL LOW (ref 26.0–34.0)
MCHC: 30.1 g/dL (ref 30.0–36.0)
MCV: 74.5 fL — ABNORMAL LOW (ref 80.0–100.0)
Monocytes Absolute: 1.7 10*3/uL — ABNORMAL HIGH (ref 0.1–1.0)
Monocytes Relative: 16 %
Neutro Abs: 7.8 10*3/uL — ABNORMAL HIGH (ref 1.7–7.7)
Neutrophils Relative %: 73 %
Platelets: 122 10*3/uL — ABNORMAL LOW (ref 150–400)
RBC: 3.61 MIL/uL — ABNORMAL LOW (ref 3.87–5.11)
RDW: 26.1 % — ABNORMAL HIGH (ref 11.5–15.5)
WBC: 10.6 10*3/uL — ABNORMAL HIGH (ref 4.0–10.5)
nRBC: 0.5 % — ABNORMAL HIGH (ref 0.0–0.2)

## 2019-07-13 LAB — CULTURE, BLOOD (ROUTINE X 2)
Culture: NO GROWTH
Culture: NO GROWTH
Special Requests: ADEQUATE
Special Requests: ADEQUATE

## 2019-07-13 LAB — GLUCOSE, CAPILLARY
Glucose-Capillary: 176 mg/dL — ABNORMAL HIGH (ref 70–99)
Glucose-Capillary: 178 mg/dL — ABNORMAL HIGH (ref 70–99)
Glucose-Capillary: 182 mg/dL — ABNORMAL HIGH (ref 70–99)
Glucose-Capillary: 219 mg/dL — ABNORMAL HIGH (ref 70–99)

## 2019-07-13 LAB — COMPREHENSIVE METABOLIC PANEL
ALT: 19 U/L (ref 0–44)
AST: 44 U/L — ABNORMAL HIGH (ref 15–41)
Albumin: 2.3 g/dL — ABNORMAL LOW (ref 3.5–5.0)
Alkaline Phosphatase: 181 U/L — ABNORMAL HIGH (ref 38–126)
Anion gap: 9 (ref 5–15)
BUN: 30 mg/dL — ABNORMAL HIGH (ref 8–23)
CO2: 27 mmol/L (ref 22–32)
Calcium: 8.2 mg/dL — ABNORMAL LOW (ref 8.9–10.3)
Chloride: 100 mmol/L (ref 98–111)
Creatinine, Ser: 0.97 mg/dL (ref 0.44–1.00)
GFR calc Af Amer: >60 mL/min (ref >60)
GFR calc non Af Amer: 59 mL/min — ABNORMAL LOW (ref >60)
Glucose, Bld: 192 mg/dL — ABNORMAL HIGH (ref 70–99)
Potassium: 2.8 mmol/L — ABNORMAL LOW (ref 3.5–5.1)
Sodium: 136 mmol/L (ref 135–145)
Total Bilirubin: 1.5 mg/dL — ABNORMAL HIGH (ref 0.3–1.2)
Total Protein: 5.9 g/dL — ABNORMAL LOW (ref 6.5–8.1)

## 2019-07-13 LAB — MAGNESIUM: Magnesium: 1.5 mg/dL — ABNORMAL LOW (ref 1.7–2.4)

## 2019-07-13 MED ORDER — DOCUSATE SODIUM 100 MG PO CAPS: 200.0000 mg | ORAL_CAPSULE | Freq: Two times a day (BID) | ORAL | Status: DC

## 2019-07-13 MED ORDER — MAGNESIUM SULFATE 4 GM/100ML IV SOLN
4.0000 g | Freq: Once | INTRAVENOUS | Status: AC
  Administered 2019-07-13: 4 g via INTRAVENOUS
  Filled 2019-07-13: qty 100

## 2019-07-13 MED ORDER — POTASSIUM CHLORIDE CRYS ER 20 MEQ PO TBCR
40.0000 meq | EXTENDED_RELEASE_TABLET | Freq: Two times a day (BID) | ORAL | Status: DC
  Administered 2019-07-13 – 2019-07-14 (×3): 40 meq via ORAL
  Filled 2019-07-13 (×3): qty 2

## 2019-07-13 MED ORDER — POLYETHYLENE GLYCOL 3350 17 G PO PACK
17.0000 g | PACK | Freq: Every day | ORAL | Status: DC
  Administered 2019-07-13 – 2019-07-14 (×2): 17 g via ORAL
  Filled 2019-07-13 (×2): qty 1

## 2019-07-13 MED ORDER — DOCUSATE SODIUM 100 MG PO CAPS
200.0000 mg | ORAL_CAPSULE | Freq: Two times a day (BID) | ORAL | Status: DC
  Administered 2019-07-13 – 2019-07-14 (×3): 200 mg via ORAL
  Filled 2019-07-13 (×3): qty 2

## 2019-07-13 MED ORDER — POTASSIUM CHLORIDE CRYS ER 20 MEQ PO TBCR: 40.0000 meq | EXTENDED_RELEASE_TABLET | Freq: Two times a day (BID) | ORAL | Status: DC

## 2019-07-13 NOTE — Progress Notes (Signed)
PROGRESS NOTE Destiny Nash   Destiny Nash  FHL:[id number]  DOB: Aug 02, 1945  DOA: 07/08/2019 PCP: [name], MD  Brief Admission Hx: 74 y.o. female with medical history significant for breast cancer on active chemotherapy, diabetes mellitus, hypertension, atrial fibrillation.  Patient presented to the ED with complaints of generalized weakness over the past several days.  She had been started on treatment outpatient for a LE cellulitis.  She is having increasing abdominal distension as well.   She was noted to be tachycardic and had a lactic acidosis on arrival.  MDM/Assessment & Plan:   1. Hypokalemia-additional replacement ordered.  Repleted low magnesium.  Continue cardiac monitoring.  2. Mild diastolic CHF exacerbation-treated with IV Lasix.  Resumed home oral Lasix 60 mg daily.  3. Hyponatremia-suspect secondary to diuretics.  Improved.   4. Hypomagnesemia-IV replacement ordered. 5. Constipation - added colace BID.  6. Liver cirrhosis-noted on CT scan.  Could this be from chemotherapy for breast cancer?  She has moderate ascites and is symptomatic and requested ultrasound paracentesis but there was not enough fluid there for removal.  7. Generalized weakness-multifactorial.  PT recommending SNF.  TOC consulted for placement.  8. Sepsis secondary to cellulitis-RESOLVED.  She was initially started on broad-spectrum antibiotics IV but now de-escalated.   Continue to monitor and supportive care ordered.  Lactate is down.  De-escalated antibiotics 07/11/19.  9. Elevated transaminases-suspect secondary to liver cirrhosis.  Following.  See ultrasound abdomen. 10. 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  CBG testing ordered.  A1c 9.1 which is poorly controlled disease.  Hold home Metformin. 11. Essential hypertension-treated with Cardizem which has been  restarted. 12. Stage Ib breast cancer diagnosed in 2/20-followed with Dr. Lindi Adie.  Status post left lumpectomy, adjuvant radiation and adjuvant antiestrogens.  She is on chemotherapy every 3 weeks with kadcyla.  Follow up witih Dr. Lindi Adie.   Pt has progressively declined since diagnosis and I have asked for palliative consult to assist with goals of care.  They were not able to see 2/4 and unavailable until 2/9.  13. Atrial fibrillation - currently heart rate is controlled.  She is fully anticoagulated with apixaban.   14. Cholelithiasis with thickened gallbladder walls - HIDA scan suggested acute cholecystitis - Dr. Arnoldo Morale consulted.  Pt doesn't have abdominal pain and tolerating diet.  Monitor for now.  Does not require cholecystostomy tube at this time.  Pt poor surgical candidate.   DVT prophylaxis: apixaban Code Status: Full  Family Communication: daughter Disposition Plan: continue inpatient management for IV antibiotics, advancing diet,  SNF tomorrow if ok with surgery  Consultants:  Surgery Arnoldo Morale)  Procedures:    Antimicrobials:   Anti-infectives (From admission, onward)   Start     Dose/Rate Route Frequency Ordered Stop   07/11/19 0900  ceFEPIme (MAXIPIME) 2 g in sodium chloride 0.9 % 100 mL IVPB     2 g 200 mL/hr over 30 Minutes Intravenous Every 8 hours 07/11/19 0813     07/09/19 1800  vancomycin (VANCOREADY) IVPB 1250 mg/250 mL  Status:  Discontinued     1,250 mg 166.7 mL/hr over 90 Minutes Intravenous Every 24 hours 07/08/19 1755 07/09/19 0847   07/09/19 1800  vancomycin (VANCOREADY) IVPB 1500 mg/300 mL  Status:  Discontinued     1,500 mg 150 mL/hr over 120 Minutes Intravenous Every 24 hours 07/09/19 0847 07/11/19 1053   07/09/19 0500  ceFEPIme (MAXIPIME) 2  g in sodium chloride 0.9 % 100 mL IVPB  Status:  Discontinued     2 g 200 mL/hr over 30 Minutes Intravenous Every 12 hours 07/08/19 1752 07/11/19 0813   07/09/19 0200  metroNIDAZOLE (FLAGYL) IVPB 500 mg     500  mg 100 mL/hr over 60 Minutes Intravenous Every 8 hours 07/08/19 2158     07/08/19 1800  vancomycin (VANCOREADY) IVPB 2000 mg/400 mL     2,000 mg 200 mL/hr over 120 Minutes Intravenous  Once 07/08/19 1750 07/08/19 2129   07/08/19 1745  ceFEPIme (MAXIPIME) 2 g in sodium chloride 0.9 % 100 mL IVPB     2 g 200 mL/hr over 30 Minutes Intravenous  Once 07/08/19 1740 07/08/19 1859   07/08/19 1745  metroNIDAZOLE (FLAGYL) IVPB 500 mg     500 mg 100 mL/hr over 60 Minutes Intravenous  Once 07/08/19 1740 07/08/19 1931   07/08/19 1745  vancomycin (VANCOCIN) IVPB 1000 mg/200 mL premix  Status:  Discontinued     1,000 mg 200 mL/hr over 60 Minutes Intravenous  Once 07/08/19 1740 07/08/19 1750      Subjective: Pt says she has not had a bowel movement in several days.  No other complaints.  Objective: Vitals:   07/12/19 2050 07/12/19 2050 07/13/19 0500 07/13/19 0538  BP: 124/70 124/70  (!) 141/68  Pulse: 77 82  86  Resp:  18  20  Temp:  (!) 97.5 F (36.4 C)  (!) 97.5 F (36.4 C)  TempSrc:      SpO2: 94% 98%  99%  Weight:   129.4 kg   Height:        Intake/Output Summary (Last 24 hours) at 07/13/2019 1359 Last data filed at 07/13/2019 0603 Gross per 24 hour  Intake 1623.41 ml  Output 900 ml  Net 723.41 ml   Filed Weights   07/11/19 0500 07/12/19 0429 07/13/19 0500  Weight: 127.1 kg 128.7 kg 129.4 kg   REVIEW OF SYSTEMS  As per history otherwise all reviewed and reported negative  Exam:  General exam: chronically ill appearing, awake, alert, NAD.  Respiratory system: BBS clear, no rales or crackles. No increased work of breathing. Cardiovascular system: irregularly irregular, normal S1 & S2 heard.  Gastrointestinal system: Abdomen is mildly distended, soft and mostly nontender. Normal bowel sounds heard.  Ascites present.  Central nervous system: Alert and oriented to person. No focal neurological deficits. Extremities: chronic venous stasis changes, 2+ edema BLEs. Resolved erythema  LLL and improving erythema RLE.   Data Reviewed: Basic Metabolic Panel: Recent Labs  Lab 07/09/19 0540 07/09/19 0747 07/09/19 1240 07/10/19 0414 07/11/19 0540 07/12/19 0632 07/13/19 0609  NA   < >  --  127* 136 135 135 136  K   < >  --  5.4* 3.8 2.5* 2.7* 2.8*  CL   < >  --  96* 100 100 100 100  CO2   < >  --  20* 23 25 26 27   GLUCOSE   < >  --  102* 75 169* 202* 192*  BUN   < >  --  48* 45* 36* 34* 30*  CREATININE   < >  --  1.42* 1.21* 1.08* 1.05* 0.97  CALCIUM   < >  --  8.2* 8.6* 8.0* 8.4* 8.2*  MG  --  1.0*  --  1.3* 1.5* 1.8 1.5*   < > = values in this interval not displayed.   Liver Function Tests: Recent Labs  Lab  07/09/19 0540 07/10/19 0414 07/11/19 0540 07/12/19 0632 07/13/19 0609  AST 53* 59* 53* 51* 44*  ALT 22 24 21 22 19   ALKPHOS 222* 219* 209* 208* 181*  BILITOT 1.8* 1.5* 1.5* 1.5* 1.5*  PROT 6.3* 5.8* 5.9* 6.4* 5.9*  ALBUMIN 2.6* 2.5* 2.3* 2.5* 2.3*   Recent Labs  Lab 07/08/19 1527  LIPASE 17   No results for input(s): AMMONIA in the last 168 hours. CBC: Recent Labs  Lab 07/09/19 0540 07/10/19 0414 07/11/19 0540 07/12/19 0632 07/13/19 0609  WBC 15.4* 11.9* 10.9* 11.5* 10.6*  NEUTROABS  --  9.8* 8.6* 8.9* 7.8*  HGB 8.1* 7.5* 7.6* 8.3* 8.1*  HCT 26.8* 25.0* 24.8* 28.1* 26.9*  MCV 74.7* 73.7* 74.3* 75.1* 74.5*  PLT 132* 108* 120* 121* 122*   Cardiac Enzymes: No results for input(s): CKTOTAL, CKMB, CKMBINDEX, TROPONINI in the last 168 hours. CBG (last 3)  Recent Labs    07/12/19 2049 07/13/19 0713 07/13/19 1058  GLUCAP 213* 176* 178*   Recent Results (from the past 240 hour(s))  Respiratory Panel by RT PCR (Flu A&B, Covid) - Nasopharyngeal Swab     Status: None   Collection Time: 07/08/19  5:34 PM   Specimen: Nasopharyngeal Swab  Result Value Ref Range Status   SARS Coronavirus 2 by RT PCR NEGATIVE NEGATIVE Final    Comment: (NOTE) SARS-CoV-2 target nucleic acids are NOT DETECTED. The SARS-CoV-2 RNA is generally detectable in  upper respiratoy specimens during the acute phase of infection. The lowest concentration of SARS-CoV-2 viral copies this assay can detect is 131 copies/mL. A negative result does not preclude SARS-Cov-2 infection and should not be used as the sole basis for treatment or other patient management decisions. A negative result may occur with  improper specimen collection/handling, submission of specimen other than nasopharyngeal swab, presence of viral mutation(s) within the areas targeted by this assay, and inadequate number of viral copies (<131 copies/mL). A negative result must be combined with clinical observations, patient history, and epidemiological information. The expected result is Negative. Fact Sheet for Patients:  PinkCheek.be Fact Sheet for Healthcare Providers:  GravelBags.it This test is not yet ap proved or cleared by the [place] FDA and  has been authorized for detection and/or diagnosis of SARS-CoV-2 by FDA under an Emergency Use Authorization (EUA). This EUA will remain  in effect (meaning this test can be used) for the duration of the COVID-19 declaration under Section 564(b)(1) of the Act, 21 U.S.C. section 360bbb-3(b)(1), unless the authorization is terminated or revoked sooner.    Influenza A by PCR NEGATIVE NEGATIVE Final   Influenza B by PCR NEGATIVE NEGATIVE Final    Comment: (NOTE) The Xpert Xpress SARS-CoV-2/FLU/RSV assay is intended as an aid in  the diagnosis of influenza from Nasopharyngeal swab specimens and  should not be used as a sole basis for treatment. Nasal washings and  aspirates are unacceptable for Xpert Xpress SARS-CoV-2/FLU/RSV  testing. Fact Sheet for Patients: PinkCheek.be Fact Sheet for Healthcare Providers: GravelBags.it This test is not yet approved or cleared by the [place] FDA and  has been authorized for  detection and/or diagnosis of SARS-CoV-2 by  FDA under an Emergency Use Authorization (EUA). This EUA will remain  in effect (meaning this test can be used) for the duration of the  Covid-19 declaration under Section 564(b)(1) of the Act, 21  U.S.C. section 360bbb-3(b)(1), unless the authorization is  terminated or revoked. Performed at [facility], [address] [place], [place] [address]  Blood culture (routine x 2)     Status: None   Collection Time: 07/08/19  5:40 PM   Specimen: Right Antecubital; Blood  Result Value Ref Range Status   Specimen Description RIGHT ANTECUBITAL  Final   Special Requests   Final    BOTTLES DRAWN AEROBIC AND ANAEROBIC Blood Culture adequate volume   Culture   Final    NO GROWTH 5 DAYS Performed at [facility], [address], [place], [place] [address]    Report Status 07/13/2019 FINAL  Final  Blood culture (routine x 2)     Status: None   Collection Time: 07/08/19  6:07 PM   Specimen: BLOOD RIGHT HAND  Result Value Ref Range Status   Specimen Description BLOOD RIGHT HAND  Final   Special Requests   Final    BOTTLES DRAWN AEROBIC ONLY Blood Culture adequate volume   Culture   Final    NO GROWTH 5 DAYS Performed at [facility], [address] [place], [place] [address]    Report Status 07/13/2019 FINAL  Final  MRSA PCR Screening     Status: Abnormal   Collection Time: 07/08/19 10:12 PM   Specimen: Nasal Mucosa; Nasopharyngeal  Result Value Ref Range Status   MRSA by PCR POSITIVE (A) NEGATIVE Final    Comment:        The GeneXpert MRSA Assay (FDA approved for NASAL specimens only), is one component of a comprehensive MRSA colonization surveillance program. It is not intended to diagnose MRSA infection nor to guide or monitor treatment for MRSA infections. RESULT CALLED TO, READ BACK BY AND VERIFIED WITH: J RHEW,RN @0004  07/09/19 [facility] Performed at [facility], [address], [place], [place] [address]   Culture, Urine     Status:  None   Collection Time: 07/09/19  2:05 AM   Specimen: Urine, Catheterized  Result Value Ref Range Status   Specimen Description   Final    URINE, CATHETERIZED Performed at [facility], [address], [place], [place] [address]    Special Requests   Final    NONE Performed at [facility], [address], [place], [place] [address]    Culture   Final    NO GROWTH Performed at [facility], [place] [address] [place], [place] [address]    Report Status 07/10/2019 FINAL  Final     Studies: DG CHEST PORT 1 VIEW  Result Date: 07/12/2019 CLINICAL DATA:  Dyspnea. EXAM: PORTABLE CHEST 1 VIEW COMPARISON:  July 08, 2019. FINDINGS: Stable cardiomediastinal silhouette. Mild central pulmonary vascular congestion is noted with bibasilar opacities concerning for edema or possibly inflammation. No pneumothorax is noted. Right internal jugular Port-A-Cath is unchanged in position. Small bilateral pleural effusions may be present. Bony thorax is unremarkable. IMPRESSION: Mild central pulmonary vascular congestion is noted with bibasilar opacities concerning for edema or possibly inflammation. Electronically Signed   By: Marijo Conception M.D.   On: 07/12/2019 11:41   Scheduled Meds: . apixaban  5 mg Oral BID  . diltiazem  300 mg Oral Daily  . docusate sodium  200 mg Oral BID  . furosemide  60 mg Oral Daily  . insulin aspart  0-5 Units Subcutaneous QHS  . insulin aspart  0-9 Units Subcutaneous TID WC  . insulin aspart  2 Units Subcutaneous TID WC  . insulin glargine  8 Units Subcutaneous QHS  . mouth rinse  15 mL Mouth Rinse q12n4p  . megestrol  400 mg Oral BID  .  mupirocin ointment  1 application Nasal BID  . polyethylene glycol  17 g Oral Daily  . potassium chloride SA  40 mEq Oral BID  . pravastatin  20 mg Oral q1800   Continuous Infusions: . sodium chloride 10 mL/hr at 07/13/19 0603  . ceFEPime (MAXIPIME) IV 2 g (07/13/19 0806)  . metronidazole 500 mg (07/13/19 0816)    Principal  Problem:   Diastolic congestive heart failure (HCC) Active Problems:   Essential hypertension   Unspecified atrial fibrillation (HCC)   Atrial fibrillation (HCC)   Type 2 diabetes mellitus (HCC)   Generalized weakness   Breast cancer (HCC)   Pressure injury of skin   Liver cirrhosis secondary to NASH (nonalcoholic steatohepatitis) (HCC)   Ascites   Cholelithiasis with gallbladder wall thickening   Chronic anticoagulation   Palliative care by specialist   Abnormal CT scan, gallbladder   AKI (acute kidney injury) ([place])   Dyspnea   Elevated liver enzymes   Hyperkalemia  Time spent:   Irwin Brakeman, MD [facility] 07/13/2019, 1:59 PM    LOS: 5 days  How to contact the [facility] Attending or Consulting provider Irwin or covering provider during after hours [place], for this patient?  1. Check the care team in [facility] and look for a) attending/consulting TRH provider listed and b) the [facility] team listed 2. Log into [email or web address] and use [place]'s universal password to access. If you do not have the password, please contact the hospital operator. 3. Locate the [facility] provider you are looking for under [facility] and page to a number that you can be directly reached. 4. If you still have difficulty reaching the provider, please page the [facility] (Director on Call) for the Hospitalists listed on amion for assistance.

## 2019-07-13 NOTE — Progress Notes (Addendum)
Subjective: Has no complaints of abdominal pain.  No nausea or vomiting.  Has not had a bowel movement for a while.  Objective: Vital signs in last 24 hours: Temp:  [97.5 F (36.4 C)-98.3 F (36.8 C)] 97.5 F (36.4 C) (02/07 0538) Pulse Rate:  [52-88] 86 (02/07 0538) Resp:  [16-20] 20 (02/07 0538) BP: (124-141)/(60-88) 141/68 (02/07 0538) SpO2:  [93 %-99 %] 99 % (02/07 0538) Weight:  [129.4 kg] 129.4 kg (02/07 0500) Last BM Date: 07/10/19  Intake/Output from previous day: 02/06 0701 - 02/07 0700 In: 2103.4 [P.O.:720; I.V.:361.8; IV Piggyback:1021.6] Out: 1400 [Urine:1400] Intake/Output this shift: No intake/output data recorded.  General appearance: alert, cooperative, appears stated age and no distress GI: soft, non-tender; bowel sounds normal; no masses,  no organomegaly  Lab Results:  Recent Labs    07/12/19 0632 07/13/19 0609  WBC 11.5* 10.6*  HGB 8.3* 8.1*  HCT 28.1* 26.9*  PLT 121* 122*   BMET Recent Labs    07/12/19 0632 07/13/19 0609  NA 135 136  K 2.7* 2.8*  CL 100 100  CO2 26 27  GLUCOSE 202* 192*  BUN 34* 30*  CREATININE 1.05* 0.97  CALCIUM 8.4* 8.2*   PT/INR No results for input(s): LABPROT, INR in the last 72 hours.  Studies/Results: DG CHEST PORT 1 VIEW  Result Date: 07/12/2019 CLINICAL DATA:  Dyspnea. EXAM: PORTABLE CHEST 1 VIEW COMPARISON:  July 08, 2019. FINDINGS: Stable cardiomediastinal silhouette. Mild central pulmonary vascular congestion is noted with bibasilar opacities concerning for edema or possibly inflammation. No pneumothorax is noted. Right internal jugular Port-A-Cath is unchanged in position. Small bilateral pleural effusions may be present. Bony thorax is unremarkable. IMPRESSION: Mild central pulmonary vascular congestion is noted with bibasilar opacities concerning for edema or possibly inflammation. Electronically Signed   By: Marijo Conception M.D.   On: 07/12/2019 11:41    Anti-infectives: Anti-infectives (From  admission, onward)   Start     Dose/Rate Route Frequency Ordered Stop   07/11/19 0900  ceFEPIme (MAXIPIME) 2 g in sodium chloride 0.9 % 100 mL IVPB     2 g 200 mL/hr over 30 Minutes Intravenous Every 8 hours 07/11/19 0813     07/09/19 1800  vancomycin (VANCOREADY) IVPB 1250 mg/250 mL  Status:  Discontinued     1,250 mg 166.7 mL/hr over 90 Minutes Intravenous Every 24 hours 07/08/19 1755 07/09/19 0847   07/09/19 1800  vancomycin (VANCOREADY) IVPB 1500 mg/300 mL  Status:  Discontinued     1,500 mg 150 mL/hr over 120 Minutes Intravenous Every 24 hours 07/09/19 0847 07/11/19 1053   07/09/19 0500  ceFEPIme (MAXIPIME) 2 g in sodium chloride 0.9 % 100 mL IVPB  Status:  Discontinued     2 g 200 mL/hr over 30 Minutes Intravenous Every 12 hours 07/08/19 1752 07/11/19 0813   07/09/19 0200  metroNIDAZOLE (FLAGYL) IVPB 500 mg     500 mg 100 mL/hr over 60 Minutes Intravenous Every 8 hours 07/08/19 2158     07/08/19 1800  vancomycin (VANCOREADY) IVPB 2000 mg/400 mL     2,000 mg 200 mL/hr over 120 Minutes Intravenous  Once 07/08/19 1750 07/08/19 2129   07/08/19 1745  ceFEPIme (MAXIPIME) 2 g in sodium chloride 0.9 % 100 mL IVPB     2 g 200 mL/hr over 30 Minutes Intravenous  Once 07/08/19 1740 07/08/19 1859   07/08/19 1745  metroNIDAZOLE (FLAGYL) IVPB 500 mg     500 mg 100 mL/hr over 60 Minutes Intravenous  Once 07/08/19 1740 07/08/19 1931   07/08/19 1745  vancomycin (VANCOCIN) IVPB 1000 mg/200 mL premix  Status:  Discontinued     1,000 mg 200 mL/hr over 60 Minutes Intravenous  Once 07/08/19 1740 07/08/19 1750      Assessment/Plan: Impression: Cholelithiasis, currently asymptomatic.  Liver enzyme tests within normal limits. Plan: No need for cholecystostomy tube at this point.  Patient seems to have responded to the Megace.  She wants her diet advanced.  I have written for a heart healthy/carb modified diet.  Have also added MiraLAX to her regimen.  She states she does have a history of intermittent  constipation.  Would treat hypokalemia, hypomagnesemia.  LOS: 5 days    Aviva Signs 07/13/2019

## 2019-07-14 ENCOUNTER — Inpatient Hospital Stay (HOSPITAL_COMMUNITY): Payer: Medicare Other

## 2019-07-14 ENCOUNTER — Inpatient Hospital Stay
Admission: RE | Admit: 2019-07-14 | Discharge: 2019-07-24 | Disposition: A | Discharge status: Other Acute Inpatient Hospital | Payer: Medicare Other | Source: Ambulatory Visit | Attending: Internal Medicine | Admitting: Internal Medicine

## 2019-07-14 DIAGNOSIS — Z881 Allergy status to other antibiotic agents status: Secondary | ICD-10-CM | POA: Diagnosis not present

## 2019-07-14 DIAGNOSIS — R0902 Hypoxemia: Secondary | ICD-10-CM | POA: Diagnosis not present

## 2019-07-14 DIAGNOSIS — R0600 Dyspnea, unspecified: Secondary | ICD-10-CM | POA: Diagnosis not present

## 2019-07-14 DIAGNOSIS — Z9981 Dependence on supplemental oxygen: Secondary | ICD-10-CM | POA: Diagnosis not present

## 2019-07-14 DIAGNOSIS — R932 Abnormal findings on diagnostic imaging of liver and biliary tract: Secondary | ICD-10-CM | POA: Diagnosis not present

## 2019-07-14 DIAGNOSIS — Z741 Need for assistance with personal care: Secondary | ICD-10-CM | POA: Diagnosis not present

## 2019-07-14 DIAGNOSIS — Z79899 Other long term (current) drug therapy: Secondary | ICD-10-CM | POA: Diagnosis not present

## 2019-07-14 DIAGNOSIS — Z9221 Personal history of antineoplastic chemotherapy: Secondary | ICD-10-CM | POA: Diagnosis not present

## 2019-07-14 DIAGNOSIS — N179 Acute kidney failure, unspecified: Secondary | ICD-10-CM | POA: Diagnosis present

## 2019-07-14 DIAGNOSIS — R600 Localized edema: Secondary | ICD-10-CM | POA: Diagnosis not present

## 2019-07-14 DIAGNOSIS — N189 Chronic kidney disease, unspecified: Secondary | ICD-10-CM | POA: Diagnosis present

## 2019-07-14 DIAGNOSIS — E785 Hyperlipidemia, unspecified: Secondary | ICD-10-CM | POA: Diagnosis present

## 2019-07-14 DIAGNOSIS — R4182 Altered mental status, unspecified: Secondary | ICD-10-CM | POA: Diagnosis not present

## 2019-07-14 DIAGNOSIS — R471 Dysarthria and anarthria: Secondary | ICD-10-CM | POA: Diagnosis not present

## 2019-07-14 DIAGNOSIS — I4891 Unspecified atrial fibrillation: Secondary | ICD-10-CM | POA: Diagnosis present

## 2019-07-14 DIAGNOSIS — E11649 Type 2 diabetes mellitus with hypoglycemia without coma: Secondary | ICD-10-CM | POA: Diagnosis not present

## 2019-07-14 DIAGNOSIS — Z885 Allergy status to narcotic agent status: Secondary | ICD-10-CM | POA: Diagnosis not present

## 2019-07-14 DIAGNOSIS — Z7901 Long term (current) use of anticoagulants: Secondary | ICD-10-CM | POA: Diagnosis not present

## 2019-07-14 DIAGNOSIS — E876 Hypokalemia: Secondary | ICD-10-CM | POA: Diagnosis not present

## 2019-07-14 DIAGNOSIS — Z6841 Body Mass Index (BMI) 40.0 and over, adult: Secondary | ICD-10-CM | POA: Diagnosis not present

## 2019-07-14 DIAGNOSIS — C50912 Malignant neoplasm of unspecified site of left female breast: Secondary | ICD-10-CM | POA: Diagnosis not present

## 2019-07-14 DIAGNOSIS — E875 Hyperkalemia: Secondary | ICD-10-CM | POA: Diagnosis not present

## 2019-07-14 DIAGNOSIS — R531 Weakness: Secondary | ICD-10-CM | POA: Diagnosis not present

## 2019-07-14 DIAGNOSIS — I5033 Acute on chronic diastolic (congestive) heart failure: Secondary | ICD-10-CM | POA: Diagnosis not present

## 2019-07-14 DIAGNOSIS — I5032 Chronic diastolic (congestive) heart failure: Principal | ICD-10-CM

## 2019-07-14 DIAGNOSIS — R1110 Vomiting, unspecified: Secondary | ICD-10-CM | POA: Diagnosis not present

## 2019-07-14 DIAGNOSIS — Z853 Personal history of malignant neoplasm of breast: Secondary | ICD-10-CM | POA: Diagnosis not present

## 2019-07-14 DIAGNOSIS — A419 Sepsis, unspecified organism: Secondary | ICD-10-CM | POA: Diagnosis present

## 2019-07-14 DIAGNOSIS — R404 Transient alteration of awareness: Secondary | ICD-10-CM | POA: Diagnosis not present

## 2019-07-14 DIAGNOSIS — I469 Cardiac arrest, cause unspecified: Secondary | ICD-10-CM | POA: Diagnosis not present

## 2019-07-14 DIAGNOSIS — R262 Difficulty in walking, not elsewhere classified: Secondary | ICD-10-CM | POA: Diagnosis not present

## 2019-07-14 DIAGNOSIS — R58 Hemorrhage, not elsewhere classified: Secondary | ICD-10-CM | POA: Diagnosis not present

## 2019-07-14 DIAGNOSIS — Z794 Long term (current) use of insulin: Secondary | ICD-10-CM | POA: Diagnosis not present

## 2019-07-14 DIAGNOSIS — I482 Chronic atrial fibrillation, unspecified: Secondary | ICD-10-CM | POA: Diagnosis not present

## 2019-07-14 DIAGNOSIS — K8000 Calculus of gallbladder with acute cholecystitis without obstruction: Secondary | ICD-10-CM | POA: Diagnosis not present

## 2019-07-14 DIAGNOSIS — E1165 Type 2 diabetes mellitus with hyperglycemia: Secondary | ICD-10-CM | POA: Diagnosis present

## 2019-07-14 DIAGNOSIS — M6281 Muscle weakness (generalized): Secondary | ICD-10-CM | POA: Diagnosis not present

## 2019-07-14 DIAGNOSIS — R6521 Severe sepsis with septic shock: Secondary | ICD-10-CM | POA: Diagnosis present

## 2019-07-14 DIAGNOSIS — R069 Unspecified abnormalities of breathing: Secondary | ICD-10-CM | POA: Diagnosis not present

## 2019-07-14 DIAGNOSIS — Z66 Do not resuscitate: Secondary | ICD-10-CM | POA: Diagnosis present

## 2019-07-14 DIAGNOSIS — I739 Peripheral vascular disease, unspecified: Secondary | ICD-10-CM | POA: Diagnosis not present

## 2019-07-14 DIAGNOSIS — I10 Essential (primary) hypertension: Secondary | ICD-10-CM | POA: Diagnosis not present

## 2019-07-14 DIAGNOSIS — K7581 Nonalcoholic steatohepatitis (NASH): Secondary | ICD-10-CM | POA: Diagnosis present

## 2019-07-14 DIAGNOSIS — Z9181 History of falling: Secondary | ICD-10-CM | POA: Diagnosis not present

## 2019-07-14 DIAGNOSIS — E1149 Type 2 diabetes mellitus with other diabetic neurological complication: Secondary | ICD-10-CM | POA: Diagnosis not present

## 2019-07-14 DIAGNOSIS — Z20822 Contact with and (suspected) exposure to covid-19: Secondary | ICD-10-CM | POA: Diagnosis present

## 2019-07-14 DIAGNOSIS — E872 Acidosis: Secondary | ICD-10-CM | POA: Diagnosis present

## 2019-07-14 DIAGNOSIS — R748 Abnormal levels of other serum enzymes: Secondary | ICD-10-CM | POA: Diagnosis not present

## 2019-07-14 DIAGNOSIS — F411 Generalized anxiety disorder: Secondary | ICD-10-CM | POA: Diagnosis not present

## 2019-07-14 DIAGNOSIS — E782 Mixed hyperlipidemia: Secondary | ICD-10-CM | POA: Diagnosis not present

## 2019-07-14 DIAGNOSIS — Z803 Family history of malignant neoplasm of breast: Secondary | ICD-10-CM | POA: Diagnosis not present

## 2019-07-14 DIAGNOSIS — K7460 Unspecified cirrhosis of liver: Secondary | ICD-10-CM | POA: Diagnosis not present

## 2019-07-14 DIAGNOSIS — J90 Pleural effusion, not elsewhere classified: Secondary | ICD-10-CM | POA: Diagnosis not present

## 2019-07-14 DIAGNOSIS — K529 Noninfective gastroenteritis and colitis, unspecified: Secondary | ICD-10-CM | POA: Diagnosis not present

## 2019-07-14 DIAGNOSIS — N950 Postmenopausal bleeding: Secondary | ICD-10-CM | POA: Diagnosis not present

## 2019-07-14 DIAGNOSIS — R188 Other ascites: Secondary | ICD-10-CM | POA: Diagnosis not present

## 2019-07-14 DIAGNOSIS — R6520 Severe sepsis without septic shock: Secondary | ICD-10-CM | POA: Diagnosis not present

## 2019-07-14 DIAGNOSIS — E1122 Type 2 diabetes mellitus with diabetic chronic kidney disease: Secondary | ICD-10-CM | POA: Diagnosis present

## 2019-07-14 DIAGNOSIS — K8020 Calculus of gallbladder without cholecystitis without obstruction: Secondary | ICD-10-CM | POA: Diagnosis not present

## 2019-07-14 DIAGNOSIS — I130 Hypertensive heart and chronic kidney disease with heart failure and stage 1 through stage 4 chronic kidney disease, or unspecified chronic kidney disease: Secondary | ICD-10-CM | POA: Diagnosis present

## 2019-07-14 DIAGNOSIS — R0989 Other specified symptoms and signs involving the circulatory and respiratory systems: Secondary | ICD-10-CM | POA: Diagnosis not present

## 2019-07-14 DIAGNOSIS — L03115 Cellulitis of right lower limb: Secondary | ICD-10-CM | POA: Diagnosis not present

## 2019-07-14 LAB — RESPIRATORY PANEL BY RT PCR (FLU A&B, COVID)
Influenza A by PCR: NEGATIVE
Influenza B by PCR: NEGATIVE
SARS Coronavirus 2 by RT PCR: NEGATIVE

## 2019-07-14 LAB — BASIC METABOLIC PANEL
Anion gap: 7 (ref 5–15)
BUN: 29 mg/dL — ABNORMAL HIGH (ref 8–23)
CO2: 26 mmol/L (ref 22–32)
Calcium: 8.2 mg/dL — ABNORMAL LOW (ref 8.9–10.3)
Chloride: 102 mmol/L (ref 98–111)
Creatinine, Ser: 0.94 mg/dL (ref 0.44–1.00)
GFR calc Af Amer: >60 mL/min (ref >60)
GFR calc non Af Amer: >60 mL/min (ref >60)
Glucose, Bld: 195 mg/dL — ABNORMAL HIGH (ref 70–99)
Potassium: 3.2 mmol/L — ABNORMAL LOW (ref 3.5–5.1)
Sodium: 135 mmol/L (ref 135–145)

## 2019-07-14 LAB — GLUCOSE, CAPILLARY
Glucose-Capillary: 158 mg/dL — ABNORMAL HIGH (ref 70–99)
Glucose-Capillary: 162 mg/dL — ABNORMAL HIGH (ref 70–99)

## 2019-07-14 LAB — MAGNESIUM: Magnesium: 1.8 mg/dL (ref 1.7–2.4)

## 2019-07-14 MED ORDER — MAGNESIUM SULFATE 2 GM/50ML IV SOLN
2.0000 g | Freq: Once | INTRAVENOUS | Status: AC
  Administered 2019-07-14: 2 g via INTRAVENOUS
  Filled 2019-07-14: qty 50

## 2019-07-14 MED ORDER — CHOLESTYRAMINE 4 G PO PACK: 4.0000 g | PACK | Freq: Every day | ORAL | Status: AC | PRN

## 2019-07-14 MED ORDER — LORAZEPAM 0.5 MG PO TABS: 0.5000 mg | ORAL_TABLET | Freq: Two times a day (BID) | ORAL | 0 refills | Status: DC | PRN

## 2019-07-14 MED ORDER — TRAMADOL HCL 50 MG PO TABS: 50.0000 mg | ORAL_TABLET | Freq: Four times a day (QID) | ORAL | 0 refills | Status: DC | PRN

## 2019-07-14 MED ORDER — CHLORHEXIDINE GLUCONATE CLOTH 2 % EX PADS
6.0000 | MEDICATED_PAD | Freq: Every day | CUTANEOUS | Status: DC
  Administered 2019-07-14: 6 via TOPICAL

## 2019-07-14 MED ORDER — POTASSIUM CHLORIDE CRYS ER 20 MEQ PO TBCR
20.0000 meq | EXTENDED_RELEASE_TABLET | Freq: Once | ORAL | Status: AC
  Administered 2019-07-14: 20 meq via ORAL
  Filled 2019-07-14: qty 1

## 2019-07-14 MED ORDER — DOCUSATE SODIUM 100 MG PO CAPS: 100.0000 mg | ORAL_CAPSULE | Freq: Two times a day (BID) | ORAL | 0 refills | Status: DC

## 2019-07-14 MED ORDER — MEGESTROL ACETATE 400 MG/10ML PO SUSP: 400.0000 mg | Freq: Two times a day (BID) | ORAL | 0 refills | Status: AC

## 2019-07-14 MED ORDER — INSULIN DETEMIR 100 UNIT/ML ~~LOC~~ SOLN: 8.0000 [IU] | Freq: Every day | SUBCUTANEOUS | Status: AC

## 2019-07-14 MED ORDER — HEPARIN SOD (PORK) LOCK FLUSH 100 UNIT/ML IV SOLN
500.0000 [IU] | INTRAVENOUS | Status: AC | PRN
  Administered 2019-07-14: 500 [IU]
  Filled 2019-07-14: qty 5

## 2019-07-14 NOTE — TOC Transition Note (Signed)
Transition of Care [facility]) - CM/SW Discharge Note   Patient Details  Name: MONQUIE FULGHAM MRN: [id number] Date of Birth: [birth year]  Transition of Care [facility]) CM/SW Contact:  Aylan Bayona, Chauncey Reading, RN Phone Number: 07/14/2019, 12:56 PM   Clinical Narrative:   Patient elects [facility] for rehab. Discharging today. Rapid covid pending. Bedside RN to call report. [facility] staff will transport. Will upload clinicals to HUB with rapid covid results.   Discussed with patient the need to pay for transportation to cancer center infusions if she continues them will at rehab. Patient endorsed understanding. She feels that her infusions will be canceled while in rehab, reports she plans to follow up with her oncologist.     Final next level of care: Skilled Nursing Facility Barriers to Discharge: Barriers Resolved   Patient Goals and CMS Choice Patient states their goals for this hospitalization and ongoing recovery are:: patient would like to go to rehab CMS Medicare.gov Compare Post Acute Care list provided to:: Patient    Discharge Placement              Patient chooses bed at: [facility] Patient to be transferred to facility by: [facility] staff   Patient and family notified of of transfer: 07/14/19  Discharge Plan and Services   Discharge Planning Services: CM Consult, Medication Assistance                                 Social Determinants of Health (SDOH) Interventions     Readmission Risk Interventions No flowsheet data found.

## 2019-07-14 NOTE — Progress Notes (Addendum)
Patient's head was hit on transport stretcher rail by transporter while being moved from bed to transport stretcher.

## 2019-07-14 NOTE — Progress Notes (Signed)
Subjective: Patient denies any abdominal pain, nausea, or vomiting.  Appetite somewhat improved.  Has not had a bowel movement yet.  Objective: Vital signs in last 24 hours: Temp:  [97.9 F (36.6 C)-98.5 F (36.9 C)] 98.5 F (36.9 C) (02/08 0533) Pulse Rate:  [74-77] 75 (02/08 0533) Resp:  [20] 20 (02/08 0533) BP: (128-133)/(56-67) 131/58 (02/08 0533) SpO2:  [100 %] 100 % (02/08 0533) Weight:  [131.7 kg] 131.7 kg (02/08 0500) Last BM Date: 07/11/19  Intake/Output from previous day: 02/07 0701 - 02/08 0700 In: 1031 [P.O.:480; I.V.:151.4; IV Piggyback:399.6] Out: 950 [Urine:950] Intake/Output this shift: No intake/output data recorded.  General appearance: alert, cooperative and no distress GI: soft, non-tender; bowel sounds normal; no masses,  no organomegaly  Lab Results:  Recent Labs    07/12/19 0632 07/13/19 0609  WBC 11.5* 10.6*  HGB 8.3* 8.1*  HCT 28.1* 26.9*  PLT 121* 122*   BMET Recent Labs    07/13/19 0609 07/14/19 0447  NA 136 135  K 2.8* 3.2*  CL 100 102  CO2 27 26  GLUCOSE 192* 195*  BUN 30* 29*  CREATININE 0.97 0.94  CALCIUM 8.2* 8.2*   PT/INR No results for input(s): LABPROT, INR in the last 72 hours.  Studies/Results: DG CHEST PORT 1 VIEW  Result Date: 07/12/2019 CLINICAL DATA:  Dyspnea. EXAM: PORTABLE CHEST 1 VIEW COMPARISON:  July 08, 2019. FINDINGS: Stable cardiomediastinal silhouette. Mild central pulmonary vascular congestion is noted with bibasilar opacities concerning for edema or possibly inflammation. No pneumothorax is noted. Right internal jugular Port-A-Cath is unchanged in position. Small bilateral pleural effusions may be present. Bony thorax is unremarkable. IMPRESSION: Mild central pulmonary vascular congestion is noted with bibasilar opacities concerning for edema or possibly inflammation. Electronically Signed   By: Marijo Conception M.D.   On: 07/12/2019 11:41    Anti-infectives: Anti-infectives (From admission, onward)    Start     Dose/Rate Route Frequency Ordered Stop   07/11/19 0900  ceFEPIme (MAXIPIME) 2 g in sodium chloride 0.9 % 100 mL IVPB     2 g 200 mL/hr over 30 Minutes Intravenous Every 8 hours 07/11/19 0813 07/13/19 1714   07/09/19 1800  vancomycin (VANCOREADY) IVPB 1250 mg/250 mL  Status:  Discontinued     1,250 mg 166.7 mL/hr over 90 Minutes Intravenous Every 24 hours 07/08/19 1755 07/09/19 0847   07/09/19 1800  vancomycin (VANCOREADY) IVPB 1500 mg/300 mL  Status:  Discontinued     1,500 mg 150 mL/hr over 120 Minutes Intravenous Every 24 hours 07/09/19 0847 07/11/19 1053   07/09/19 0500  ceFEPIme (MAXIPIME) 2 g in sodium chloride 0.9 % 100 mL IVPB  Status:  Discontinued     2 g 200 mL/hr over 30 Minutes Intravenous Every 12 hours 07/08/19 1752 07/11/19 0813   07/09/19 0200  metroNIDAZOLE (FLAGYL) IVPB 500 mg     500 mg 100 mL/hr over 60 Minutes Intravenous Every 8 hours 07/08/19 2158 07/14/19 0959   07/08/19 1800  vancomycin (VANCOREADY) IVPB 2000 mg/400 mL     2,000 mg 200 mL/hr over 120 Minutes Intravenous  Once 07/08/19 1750 07/08/19 2129   07/08/19 1745  ceFEPIme (MAXIPIME) 2 g in sodium chloride 0.9 % 100 mL IVPB     2 g 200 mL/hr over 30 Minutes Intravenous  Once 07/08/19 1740 07/08/19 1859   07/08/19 1745  metroNIDAZOLE (FLAGYL) IVPB 500 mg     500 mg 100 mL/hr over 60 Minutes Intravenous  Once 07/08/19 1740 07/08/19 1931  07/08/19 1745  vancomycin (VANCOCIN) IVPB 1000 mg/200 mL premix  Status:  Discontinued     1,000 mg 200 mL/hr over 60 Minutes Intravenous  Once 07/08/19 1740 07/08/19 1750      Assessment/Plan: Impression: No evidence for acute cholecystitis.  No need for cholecystostomy tube at this point. Plan: Will sign off.  Please call us if we can be of further assistance.  LOS: 6 days    Aviva Signs 07/14/2019

## 2019-07-14 NOTE — Progress Notes (Signed)
Nsg Discharge Note  Admit Date:  07/08/2019 Discharge date: 07/14/2019   Destiny Nash to be D/C'd [facility] per MD order.    Discharge Medication: Allergies as of 07/14/2019      Reactions   Levaquin [levofloxacin] Other (See Comments)   Joint "seize" and constipation   Codeine Other (See Comments)   Severe headache      Medication List    STOP taking these medications   CINNAMON PO   doxycycline 100 MG tablet Commonly known as: VIBRA-TABS     TAKE these medications   acetaminophen 500 MG tablet Commonly known as: TYLENOL Take 1,000 mg by mouth every 6 (six) hours as needed for moderate pain or headache.   albuterol 108 (90 Base) MCG/ACT inhaler Commonly known as: VENTOLIN HFA Inhale 2 puffs into the lungs every 6 (six) hours as needed for wheezing or shortness of breath.   apixaban 5 MG Tabs tablet Commonly known as: ELIQUIS Take 1 tablet (5 mg total) by mouth 2 (two) times daily.   cholestyramine 4 g packet Commonly known as: Questran Take 1 packet (4 g total) by mouth daily as needed (ITCHING). What changed:   when to take this  reasons to take this   diltiazem 300 MG 24 hr capsule Commonly known as: Cardizem CD Take 1 capsule (300 mg total) by mouth daily.   diphenoxylate-atropine 2.5-0.025 MG tablet Commonly known as: LOMOTIL TAKE 1 TABLET BY MOUTH 4 TIMES DAILY AS NEEDED FOR DIARRHEA OR  LOOSE  STOOLS What changed: See the new instructions.   docusate sodium 100 MG capsule Commonly known as: COLACE Take 1 capsule (100 mg total) by mouth 2 (two) times daily.   furosemide 40 MG tablet Commonly known as: LASIX Take 1.5 tablets (60 mg total) by mouth daily.   insulin detemir 100 UNIT/ML injection Commonly known as: LEVEMIR Inject 0.08 mLs (8 Units total) into the skin at bedtime.   LORazepam 0.5 MG tablet Commonly known as: ATIVAN Take 1 tablet (0.5 mg total) by mouth 2 (two) times daily as needed for anxiety. What changed:    medication strength  how much to take  how to take this  when to take this  reasons to take this  additional instructions   Magnesium Oxide 400 MG Caps Take 1 capsule (400 mg total) by mouth daily.   megestrol 400 MG/10ML suspension Commonly known as: MEGACE Take 10 mLs (400 mg total) by mouth 2 (two) times daily.   metFORMIN 500 MG tablet Commonly known as: GLUCOPHAGE Take 1,000 mg by mouth 2 (two) times daily with a meal.   niacin 500 MG tablet Take 500 mg by mouth daily.   nystatin powder Generic drug: nystatin Apply 1 Bottle topically daily as needed (irritation).   Omega-3 1000 MG Caps Take 1,000 mg by mouth 2 (two) times daily.   ondansetron 8 MG tablet Commonly known as: Zofran Take 1 tablet (8 mg total) by mouth 2 (two) times daily as needed (Nausea or vomiting).   pantoprazole 40 MG tablet Commonly known as: PROTONIX Take 1 tablet by mouth once daily   potassium chloride SA 20 MEQ tablet Commonly known as: KLOR-CON Take 1 tablet (20 mEq total) by mouth 2 (two) times daily.   pravastatin 20 MG tablet Commonly known as: PRAVACHOL Take 20 mg by mouth daily before breakfast.   prochlorperazine 10 MG tablet Commonly known as: COMPAZINE Take 1 tablet (10 mg total) by mouth every 6 (six) hours as needed (  Nausea or vomiting).   traMADol 50 MG tablet Commonly known as: Ultram Take 1 tablet (50 mg total) by mouth every 6 (six) hours as needed for moderate pain.       Discharge Assessment: Vitals:   07/14/19 0900 07/14/19 1012  BP:    Pulse: 79   Resp: 20   Temp: 97.9 F (36.6 C)   SpO2: 100% 99%   Skin clean, dry and intact without evidence of skin break down, no evidence of skin tears noted. IV catheter discontinued intact. Site without signs and symptoms of complications - no redness or edema noted at insertion site, patient denies c/o pain - only slight tenderness at site.  Dressing with slight pressure applied.  D/c  Instructions-Education: Report called and given to [place]. All questions were answered and no further questions at this time. Patient in stable condition and in no acute distress at time of discharge. Patient to be escorted by [place], RN 07/14/2019 2:58 PM

## 2019-07-14 NOTE — Discharge Instructions (Signed)
PLEASE CHECK BMP IN 1 WEEK TO FOLLOW UP POTASSIUM LEVEL.

## 2019-07-14 NOTE — Care Management Important Message (Signed)
Important Message  Patient Details  Name: Destiny Nash MRN: [id number] Date of Birth: 1945-08-13   Medicare Important Message Given:  Yes     Tommy Medal 07/14/2019, 12:05 PM

## 2019-07-14 NOTE — Discharge Summary (Addendum)
Physician Discharge Summary  Destiny Nash XBJ:[id number] DOB: 1945/10/03 DOA: 07/08/2019  PCP: [name], MD Oncologist: Dr. Lindi Adie  Admit date: 07/08/2019 Discharge date: 07/14/2019  Disposition:  SNF   Recommendations for Outpatient Follow-up:  1. Please obtain BMP in 1 week to follow potassium level 2. Please follow up with oncologist in 1-2 weeks 3. Please get palliative medicine consultation  4. Please make outpatient GI appointment for newly discovered cirrhosis of liver 5. Please monitor CBG 4 times per day or per established protocols.  6. Please elevated right upper extremity and provide cold compress until IV infiltration resolved.  Discharge Condition: STABLE   CODE STATUS: FULL    Brief Hospitalization Summary: Please see all hospital notes, images, labs for full details of the hospitalization. ADMISSION HPI: Destiny Nash is a 74 y.o. female with medical history significant for breast cancer on chemotherapy, diabetes mellitus, hypertension, atrial fibrillation.  Patient presented to the ED with complaints of generalized weakness over the past several days.  Her son reports difficulty breathing that started today.  No cough.  No chest pain.  She had has bilateral chronic lower extremity swelling, that she thinks has worsened over the past 2 to 3 weeks. She reports about 4 episodes of vomiting today, none prior.  She also reports chronic loose stools-averaging 2 episodes per day which is unchanged and related to her chemotherapy.  No abdominal pain.  No dysuria. She reports yesterday she was so weak that she fell while trying to climb the stairs.  She landed on her buttock.  She denies pain to pelvic area.  She did not lose consciousness and she did not hit her head. Patient is on treatment for cellulitis involving her lower extremity, doxycycline, for several days.  She has chronic redness of her lower extremity which has not significantly changed.  ED Course: Mild  intermittent tachycardia to 102, temperature 97.7.  Blood pressure 10 7-1 29, O2 sats down to 84% on room air, placed on 3 L nasal cannula.  Creatinine elevated at 1.4, baseline 0.8-1.  Potassium elevated at 6.2.  Significant lactic acidosis to 4.4.  Portable chest x-ray shows mild cardiomegaly with vascular congestion and probable trace bilateral pleural effusions.  1 L bolus normal saline given in the ED.  UA and urine cultures pending.  Broad spectrum antibiotics IV vancomycin, cefepime and metronidazole started for possible sepsis.  Lokelma 5 mg given.  Hospitalist to admit for further evaluation and treatment.  Brief Admission Hx: 73 y.o.femalewith medical history significant forbreast cancer on active chemotherapy, diabetes mellitus, hypertension, atrial fibrillation.Patient presented to the ED with complaints of generalized weakness over the past several days.  She had been started on treatment outpatient for a LE cellulitis.  She is having increasing abdominal distension as well.   She was noted to be tachycardic and had a lactic acidosis on arrival.  MDM/Assessment & Plan:   1. Hypokalemia-additional replacement ordered.  Repleted low magnesium.  Resumed home BID potassium.  2. Mild diastolic CHF exacerbation-treated with IV Lasix.  Resumed home oral Lasix 60 mg daily.  3. Hyponatremia-suspect secondary to diuretics.  Improved.   4. Hypomagnesemia-IV replacement ordered and has been repleted.   5. Constipation - added colace BID to regimen.  6. Liver cirrhosis-noted on CT scan.  Could this be from chemotherapy for breast cancer?  She has moderate ascites and is symptomatic and requested ultrasound paracentesis but there was not enough fluid there for removal.  Outpatient follow up with GI.  7. Generalized weakness-multifactorial.  PT recommending SNF.  TOC consulted for placement.  8. Sepsis secondary to cellulitis-RESOLVED.  She was initially started on broad-spectrum antibiotics IV  but now de-escalated.   Continue to monitor and supportive care ordered.  Lactate is down.  De-escalated antibiotics 07/11/19.  9. Cellulitis of BLEs - treated and resolved.  Completed antibiotics.   10. Elevated transaminases-suspect secondary to liver cirrhosis.   See ultrasound abdomen. 11. Right arm edema - appears related to IV infiltration, will obtain venous US right upper extremity although patient is already on apixaban.  Recommended cold compressess and elevated.  Asked RN to remove IV from the right arm.  12. 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  CBG testing ordered.  A1c 9.1 which is poorly controlled disease.  Hold home Metformin. 13. Essential hypertension-treated with Cardizem which has been restarted. 14. Stage Ib breast cancer diagnosed in 2/20-followed with Dr. Lindi Adie.  Status post left lumpectomy, adjuvant radiation and adjuvant antiestrogens.  She is on chemotherapy every 3 weeks with kadcyla.  Follow up witih Dr. Lindi Adie.   Pt has progressively declined since diagnosis and I have asked for palliative consult to assist with goals of care.  They were not able to see 2/4 and unavailable until 2/9.  Please arrange outpatient palliative medicine consultation.   15. Atrial fibrillation - currently heart rate is controlled.  She is fully anticoagulated with apixaban.   16. Cholelithiasis with thickened gallbladder walls - HIDA scan suggested acute cholecystitis - Dr. Arnoldo Morale consulted.  Pt doesn't have abdominal pain and tolerating diet.  Monitor for now.  Does not require cholecystostomy tube at this time.  Pt poor surgical candidate.  Surgery signed off today.   DVT prophylaxis: apixaban Code Status: Full  Family Communication: daughter Disposition Plan: DC to SNF   Consultants:  Surgery Arnoldo Morale)  Discharge Diagnoses:  Principal Problem:   Diastolic  congestive heart failure ([place]) Active Problems:   Essential hypertension   Unspecified atrial fibrillation (H[facility])   Atrial fibrillation (H[facility])   Type 2 diabetes mellitus ([facility])   Generalized weakness   Breast cancer (HCC)   Pressure injury of skin   Liver cirrhosis secondary to NASH (nonalcoholic steatohepatitis) ([place])   Ascites   Cholelithiasis with gallbladder wall thickening   Chronic anticoagulation   Palliative care by specialist   Abnormal CT scan, gallbladder   AKI (acute kidney injury) ([place])   Dyspnea   Elevated liver enzymes   Hyperkalemia  Discharge Instructions:  Allergies as of 07/14/2019      Reactions   Levaquin [levofloxacin] Other (See Comments)   Joint "seize" and constipation   Codeine Other (See Comments)   Severe headache      Medication List    STOP taking these medications   CINNAMON PO   doxycycline 100 MG tablet Commonly known as: VIBRA-TABS     TAKE these medications   acetaminophen 500 MG tablet Commonly known as: TYLENOL Take 1,000 mg by mouth every 6 (six) hours as needed for moderate pain or headache.   albuterol 108 (90 Base) MCG/ACT inhaler Commonly known as: VENTOLIN HFA Inhale 2 puffs into the lungs every 6 (six) hours as needed for wheezing or shortness of breath.   apixaban 5 MG Tabs tablet Commonly known as: ELIQUIS Take 1 tablet (5 mg total) by mouth 2 (two) times daily.   cholestyramine 4 g packet Commonly known as: Questran Take 1  packet (4 g total) by mouth daily as needed (ITCHING). What changed:   when to take this  reasons to take this   diltiazem 300 MG 24 hr capsule Commonly known as: Cardizem CD Take 1 capsule (300 mg total) by mouth daily.   diphenoxylate-atropine 2.5-0.025 MG tablet Commonly known as: LOMOTIL TAKE 1 TABLET BY MOUTH 4 TIMES DAILY AS NEEDED FOR DIARRHEA OR  LOOSE  STOOLS What changed: See the new instructions.   docusate sodium 100 MG capsule Commonly known as: COLACE Take 1 capsule (100  mg total) by mouth 2 (two) times daily.   furosemide 40 MG tablet Commonly known as: LASIX Take 1.5 tablets (60 mg total) by mouth daily.   insulin detemir 100 UNIT/ML injection Commonly known as: LEVEMIR Inject 0.08 mLs (8 Units total) into the skin at bedtime.   LORazepam 0.5 MG tablet Commonly known as: ATIVAN Take 1 tablet (0.5 mg total) by mouth 2 (two) times daily as needed for anxiety. What changed:   medication strength  how much to take  how to take this  when to take this  reasons to take this  additional instructions   Magnesium Oxide 400 MG Caps Take 1 capsule (400 mg total) by mouth daily.   megestrol 400 MG/10ML suspension Commonly known as: MEGACE Take 10 mLs (400 mg total) by mouth 2 (two) times daily.   metFORMIN 500 MG tablet Commonly known as: GLUCOPHAGE Take 1,000 mg by mouth 2 (two) times daily with a meal.   niacin 500 MG tablet Take 500 mg by mouth daily.   nystatin powder Generic drug: nystatin Apply 1 Bottle topically daily as needed (irritation).   Omega-3 1000 MG Caps Take 1,000 mg by mouth 2 (two) times daily.   ondansetron 8 MG tablet Commonly known as: Zofran Take 1 tablet (8 mg total) by mouth 2 (two) times daily as needed (Nausea or vomiting).   pantoprazole 40 MG tablet Commonly known as: PROTONIX Take 1 tablet by mouth once daily   potassium chloride SA 20 MEQ tablet Commonly known as: KLOR-CON Take 1 tablet (20 mEq total) by mouth 2 (two) times daily.   pravastatin 20 MG tablet Commonly known as: PRAVACHOL Take 20 mg by mouth daily before breakfast.   prochlorperazine 10 MG tablet Commonly known as: COMPAZINE Take 1 tablet (10 mg total) by mouth every 6 (six) hours as needed (Nausea or vomiting).   traMADol 50 MG tablet Commonly known as: Ultram Take 1 tablet (50 mg total) by mouth every 6 (six) hours as needed for moderate pain.      Contact information for after-discharge care    [facility] .   Service: Skilled Nursing Contact information: [address] [address] [phone]             Allergies  Allergen Reactions  . Levaquin [Levofloxacin] Other (See Comments)    Joint "seize" and constipation  . Codeine Other (See Comments)    Severe headache   Allergies as of 07/14/2019      Reactions   Levaquin [levofloxacin] Other (See Comments)   Joint "seize" and constipation   Codeine Other (See Comments)   Severe headache      Medication List    STOP taking these medications   CINNAMON PO   doxycycline 100 MG tablet Commonly known as: VIBRA-TABS     TAKE these medications   acetaminophen 500 MG tablet Commonly known as:  TYLENOL Take 1,000 mg by mouth every 6 (six) hours as needed for moderate pain or headache.   albuterol 108 (90 Base) MCG/ACT inhaler Commonly known as: VENTOLIN HFA Inhale 2 puffs into the lungs every 6 (six) hours as needed for wheezing or shortness of breath.   apixaban 5 MG Tabs tablet Commonly known as: ELIQUIS Take 1 tablet (5 mg total) by mouth 2 (two) times daily.   cholestyramine 4 g packet Commonly known as: Questran Take 1 packet (4 g total) by mouth daily as needed (ITCHING). What changed:   when to take this  reasons to take this   diltiazem 300 MG 24 hr capsule Commonly known as: Cardizem CD Take 1 capsule (300 mg total) by mouth daily.   diphenoxylate-atropine 2.5-0.025 MG tablet Commonly known as: LOMOTIL TAKE 1 TABLET BY MOUTH 4 TIMES DAILY AS NEEDED FOR DIARRHEA OR  LOOSE  STOOLS What changed: See the new instructions.   docusate sodium 100 MG capsule Commonly known as: COLACE Take 1 capsule (100 mg total) by mouth 2 (two) times daily.   furosemide 40 MG tablet Commonly known as: LASIX Take 1.5 tablets (60 mg total) by mouth daily.   insulin detemir 100 UNIT/ML injection Commonly known as: LEVEMIR Inject 0.08 mLs (8 Units total) into the skin  at bedtime.   LORazepam 0.5 MG tablet Commonly known as: ATIVAN Take 1 tablet (0.5 mg total) by mouth 2 (two) times daily as needed for anxiety. What changed:   medication strength  how much to take  how to take this  when to take this  reasons to take this  additional instructions   Magnesium Oxide 400 MG Caps Take 1 capsule (400 mg total) by mouth daily.   megestrol 400 MG/10ML suspension Commonly known as: MEGACE Take 10 mLs (400 mg total) by mouth 2 (two) times daily.   metFORMIN 500 MG tablet Commonly known as: GLUCOPHAGE Take 1,000 mg by mouth 2 (two) times daily with a meal.   niacin 500 MG tablet Take 500 mg by mouth daily.   nystatin powder Generic drug: nystatin Apply 1 Bottle topically daily as needed (irritation).   Omega-3 1000 MG Caps Take 1,000 mg by mouth 2 (two) times daily.   ondansetron 8 MG tablet Commonly known as: Zofran Take 1 tablet (8 mg total) by mouth 2 (two) times daily as needed (Nausea or vomiting).   pantoprazole 40 MG tablet Commonly known as: PROTONIX Take 1 tablet by mouth once daily   potassium chloride SA 20 MEQ tablet Commonly known as: KLOR-CON Take 1 tablet (20 mEq total) by mouth 2 (two) times daily.   pravastatin 20 MG tablet Commonly known as: PRAVACHOL Take 20 mg by mouth daily before breakfast.   prochlorperazine 10 MG tablet Commonly known as: COMPAZINE Take 1 tablet (10 mg total) by mouth every 6 (six) hours as needed (Nausea or vomiting).   traMADol 50 MG tablet Commonly known as: Ultram Take 1 tablet (50 mg total) by mouth every 6 (six) hours as needed for moderate pain.       Procedures/Studies: CT ABDOMEN PELVIS WO CONTRAST  Result Date: 07/09/2019 CLINICAL DATA:  Elevated ALP with lactic acidosis and leukocytosis. History of breast cancer on chemotherapy. Generalized weakness for several days. Multiple loose stools. EXAM: CT CHEST, ABDOMEN AND PELVIS WITHOUT CONTRAST TECHNIQUE: Multidetector CT  imaging of the chest, abdomen and pelvis was performed following the standard protocol without IV contrast. COMPARISON:  CT abdomen dated November 29, 2018. FINDINGS:  CT CHEST FINDINGS Cardiovascular: The main pulmonary artery is significantly dilated measuring approximately 4 cm in diameter. There is no thoracic aortic aneurysm. Mild atherosclerotic changes are noted of the thoracic aorta. Three-vessel coronary artery disease is noted. There is mild cardiac enlargement. There is a well-positioned right Port-A-Cath with tip terminating in the SVC. Mediastinum/Nodes: --there are multiple mildly enlarged mediastinal lymph nodes. For example there is a 1.3 cm right lower paratracheal lymph node (axial series 2, image 15). There is a 1 cm lymph node at the level of the upper trachea (axial series 2, image 10). These are essentially stable since 01/15/2019. --the patient is status post prior lymph node dissection in the left axillary region. --No supraclavicular lymphadenopathy. --Normal thyroid gland. --The esophagus is unremarkable Lungs/Pleura: There are moderate-sized bilateral pleural effusions, decreased in size from prior study dated November 29, 2018. There is mild interlobular septal thickening with peripheral ground-glass airspace opacities in the anterior left upper lobe. Musculoskeletal: No chest wall abnormality. No acute or significant osseous findings. CT ABDOMEN PELVIS FINDINGS Hepatobiliary: Liver surface is somewhat nodular raising concern for cirrhosis. The gallbladder is grossly abnormal with diffuse gallbladder wall thickening and suggestion of large gallstones.There is no biliary ductal dilation. Pancreas: Normal contours without ductal dilatation. No peripancreatic fluid collection. Spleen: No splenic laceration or hematoma. Adrenals/Urinary Tract: --Adrenal glands: No adrenal hemorrhage. --Right kidney/ureter: No hydronephrosis or perinephric hematoma. --Left kidney/ureter: No hydronephrosis or  perinephric hematoma. --Urinary bladder: Unremarkable. Stomach/Bowel: --Stomach/Duodenum: No hiatal hernia or other gastric abnormality. Normal duodenal course and caliber. --Small bowel: No dilatation or inflammation. --Colon: No focal abnormality. --Appendix: Not visualized. No right lower quadrant inflammation or free fluid. Vascular/Lymphatic: Atherosclerotic calcification is present within the non-aneurysmal abdominal aorta, without hemodynamically significant stenosis. --there is a pericaval structure measuring approximately 1.4 cm (axial series 2, image 67). Given the branching appearance more inferiorly, this is favored to represent a prominent ovarian vein as opposed to a retroperitoneal lymph node. This appears to be new since prior study. Enlarged lymph nodes are noted in the porta hepatis but are difficult to evaluate due to a lack of IV contrast. --No mesenteric lymphadenopathy. --No pelvic or inguinal lymphadenopathy. Reproductive: The pelvis is poorly evaluated secondary to extensive streak artifact. The patient appears to be status post hysterectomy. Other: There is a moderate volume of abdominal ascites. Diffuse body wall edema is noted there is no free air. Musculoskeletal. No acute displaced fractures. Again noted is a compression fracture of the inferior endplate of the L3 vertebral body, similar to prior study. IMPRESSION: 1. Abnormal appearance of the gallbladder with gallbladder wall thickening and likely underlying gallstones. Correlation with ultrasound is recommended as findings can be seen in patients with acute cholecystitis. 2. The main pulmonary artery is significantly dilated measuring up to 4 cm in diameter. This can be seen in the setting of pulmonary arterial hypertension. 3. Moderate bilateral pleural effusions, improved from prior study. There is some mild interlobular septal thickening with a few scattered ground-glass airspace opacities favored to represent mild interstitial  edema. An atypical infectious process is not excluded. 4. There is a moderate volume abdominal ascites and diffuse body wall edema. Aortic Atherosclerosis (ICD10-I70.0). Electronically Signed   By: Constance Holster M.D.   On: 07/09/2019 02:24   CT CHEST WO CONTRAST  Result Date: 07/09/2019 CLINICAL DATA:  Elevated ALP with lactic acidosis and leukocytosis. History of breast cancer on chemotherapy. Generalized weakness for several days. Multiple loose stools. EXAM: CT CHEST, ABDOMEN AND PELVIS WITHOUT CONTRAST  TECHNIQUE: Multidetector CT imaging of the chest, abdomen and pelvis was performed following the standard protocol without IV contrast. COMPARISON:  CT abdomen dated November 29, 2018. FINDINGS: CT CHEST FINDINGS Cardiovascular: The main pulmonary artery is significantly dilated measuring approximately 4 cm in diameter. There is no thoracic aortic aneurysm. Mild atherosclerotic changes are noted of the thoracic aorta. Three-vessel coronary artery disease is noted. There is mild cardiac enlargement. There is a well-positioned right Port-A-Cath with tip terminating in the SVC. Mediastinum/Nodes: --there are multiple mildly enlarged mediastinal lymph nodes. For example there is a 1.3 cm right lower paratracheal lymph node (axial series 2, image 15). There is a 1 cm lymph node at the level of the upper trachea (axial series 2, image 10). These are essentially stable since 01/15/2019. --the patient is status post prior lymph node dissection in the left axillary region. --No supraclavicular lymphadenopathy. --Normal thyroid gland. --The esophagus is unremarkable Lungs/Pleura: There are moderate-sized bilateral pleural effusions, decreased in size from prior study dated November 29, 2018. There is mild interlobular septal thickening with peripheral ground-glass airspace opacities in the anterior left upper lobe. Musculoskeletal: No chest wall abnormality. No acute or significant osseous findings. CT ABDOMEN PELVIS  FINDINGS Hepatobiliary: Liver surface is somewhat nodular raising concern for cirrhosis. The gallbladder is grossly abnormal with diffuse gallbladder wall thickening and suggestion of large gallstones.There is no biliary ductal dilation. Pancreas: Normal contours without ductal dilatation. No peripancreatic fluid collection. Spleen: No splenic laceration or hematoma. Adrenals/Urinary Tract: --Adrenal glands: No adrenal hemorrhage. --Right kidney/ureter: No hydronephrosis or perinephric hematoma. --Left kidney/ureter: No hydronephrosis or perinephric hematoma. --Urinary bladder: Unremarkable. Stomach/Bowel: --Stomach/Duodenum: No hiatal hernia or other gastric abnormality. Normal duodenal course and caliber. --Small bowel: No dilatation or inflammation. --Colon: No focal abnormality. --Appendix: Not visualized. No right lower quadrant inflammation or free fluid. Vascular/Lymphatic: Atherosclerotic calcification is present within the non-aneurysmal abdominal aorta, without hemodynamically significant stenosis. --there is a pericaval structure measuring approximately 1.4 cm (axial series 2, image 67). Given the branching appearance more inferiorly, this is favored to represent a prominent ovarian vein as opposed to a retroperitoneal lymph node. This appears to be new since prior study. Enlarged lymph nodes are noted in the porta hepatis but are difficult to evaluate due to a lack of IV contrast. --No mesenteric lymphadenopathy. --No pelvic or inguinal lymphadenopathy. Reproductive: The pelvis is poorly evaluated secondary to extensive streak artifact. The patient appears to be status post hysterectomy. Other: There is a moderate volume of abdominal ascites. Diffuse body wall edema is noted there is no free air. Musculoskeletal. No acute displaced fractures. Again noted is a compression fracture of the inferior endplate of the L3 vertebral body, similar to prior study. IMPRESSION: 1. Abnormal appearance of the  gallbladder with gallbladder wall thickening and likely underlying gallstones. Correlation with ultrasound is recommended as findings can be seen in patients with acute cholecystitis. 2. The main pulmonary artery is significantly dilated measuring up to 4 cm in diameter. This can be seen in the setting of pulmonary arterial hypertension. 3. Moderate bilateral pleural effusions, improved from prior study. There is some mild interlobular septal thickening with a few scattered ground-glass airspace opacities favored to represent mild interstitial edema. An atypical infectious process is not excluded. 4. There is a moderate volume abdominal ascites and diffuse body wall edema. Aortic Atherosclerosis (ICD10-I70.0). Electronically Signed   By: Constance Holster M.D.   On: 07/09/2019 02:24   US Abdomen Complete  Result Date: 07/09/2019 CLINICAL DATA:  Abnormal gallbladder  on CT.  Denies abdominal pain. EXAM: ABDOMEN ULTRASOUND COMPLETE COMPARISON:  CT abdomen pelvis from same day. FINDINGS: Gallbladder: 3.6 cm gallstone in the gallbladder neck. Sludge. Diffuse gallbladder wall thickening measuring up to 1 cm. No sonographic Murphy sign noted by sonographer. Common bile duct: Diameter: 5 mm, normal. Liver: No focal lesion identified. Nodular contour with heterogeneous parenchymal echogenicity. Portal vein is patent on color Doppler imaging with normal direction of blood flow towards the liver. IVC: No abnormality visualized. Pancreas: Not visualized due to overlying bowel gas. Spleen: Size and appearance within normal limits. Right Kidney: Length: 11.6 cm. Echogenicity within normal limits. No mass or hydronephrosis visualized. Left Kidney: Length: 11.0 cm. Echogenicity within normal limits. No mass or hydronephrosis visualized. Abdominal aorta: Not visualized due to body habitus and overlying bowel gas. Other findings: Moderate ascites. IMPRESSION: 1. Cholelithiasis and diffuse gallbladder wall thickening. Findings are  nonspecific and could be related to underlying liver disease or acute cholecystitis, though the absence of abdominal pain or positive sonographic Murphy sign argues against cholecystitis. Consider hepatobiliary scan for definitive evaluation. 2. Unchanged cirrhosis and moderate ascites. Electronically Signed   By: Titus Dubin M.D.   On: 07/09/2019 12:50   NM Hepato W/EF  Result Date: 07/10/2019 CLINICAL DATA:  Cholelithiasis, question cholecystitis EXAM: NUCLEAR MEDICINE HEPATOBILIARY IMAGING TECHNIQUE: Sequential images of the abdomen were obtained out to 60 minutes following intravenous administration of radiopharmaceutical. Additional imaging was performed until 90 minutes. Delayed anterior and RIGHT lateral images were obtained 4 hours. Patient has not a history of allergy to codeine and does not know if she has an allergy to morphine; therefore morphine augmentation was not performed. RADIOPHARMACEUTICALS:  5.2 mCi Tc-58m Choletec IV COMPARISON:  Ultrasound abdomen 07/09/2019, CT abdomen 07/09/2019 FINDINGS: Prompt clearance of tracer from bloodstream indicating normal hepatocellular function. Prompt excretion of tracer into biliary tree. Small bowel visualized at 20 minutes. At 90 minutes gallbladder had not visualized. Delayed images at 4 hours demonstrate tracer activity over the chest and liver; the uptake over the liver appears too cranial for position of the gallbladder as identified on coronal CT images and I suspect both sites represents superimposed IV tubing, which is present laterally on the first hour of imaging. IMPRESSION: Nonvisualization of the gallbladder despite delayed imaging to 4 hours. Findings consistent with acute cholecystitis. Electronically Signed   By: MLavonia DanaM.D.   On: 07/10/2019 16:25   [place]Abdomen Limited  Result Date: 07/09/2019 CLINICAL DATA:  Ascites EXAM: LIMITED ABDOMEN ULTRASOUND FOR ASCITES TECHNIQUE: Limited ultrasound survey for ascites was performed in all  four abdominal quadrants. COMPARISON:  CT abdomen and pelvis 07/09/2019 FINDINGS: Survey imaging of the abdomen was performed for potential paracentesis. Scattered ascites is identified, majority deep in pelvis and perihepatic. No adequate pocket of ascites is identified at the flanks to allow access for adequate paracentesis. IMPRESSION: No adequate pocket of ascites for paracentesis. Electronically Signed   By: MLavonia DanaM.D.   On: 07/09/2019 14:52   [place]oreaVenous Img Lower Bilateral (DVT)  Result Date: 07/09/2019 CLINICAL DATA:  Bilateral lower extremity edema and pain. EXAM: BILATERAL LOWER EXTREMITY VENOUS DOPPLER ULTRASOUND TECHNIQUE: Gray-scale sonography with graded compression, as well as color Doppler and duplex ultrasound were performed to evaluate the lower extremity deep venous systems from the level of the common femoral vein and including the common femoral, femoral, profunda femoral, popliteal and calf veins including the posterior tibial, peroneal and gastrocnemius veins when visible. The superficial great saphenous vein was  also interrogated. Spectral Doppler was utilized to evaluate flow at rest and with distal augmentation maneuvers in the common femoral, femoral and popliteal veins. COMPARISON:  None. FINDINGS: RIGHT LOWER EXTREMITY Common Femoral Vein: No evidence of thrombus. Normal compressibility, respiratory phasicity and response to augmentation. Saphenofemoral Junction: No evidence of thrombus. Normal compressibility and flow on color Doppler imaging. Profunda Femoral Vein: No evidence of thrombus. Normal compressibility and flow on color Doppler imaging. Femoral Vein: No evidence of thrombus. Normal compressibility, respiratory phasicity and response to augmentation. Popliteal Vein: No evidence of thrombus. Normal compressibility, respiratory phasicity and response to augmentation. Calf Veins: Extremely limited visualization of calf veins. Superficial Great Saphenous Vein: No evidence  of thrombus. Normal compressibility. Venous Reflux:  None. Other Findings: No evidence of superficial thrombophlebitis or abnormal fluid collection. LEFT LOWER EXTREMITY Common Femoral Vein: No evidence of thrombus. Normal compressibility, respiratory phasicity and response to augmentation. Saphenofemoral Junction: No evidence of thrombus. Normal compressibility and flow on color Doppler imaging. Profunda Femoral Vein: No evidence of thrombus. Normal compressibility and flow on color Doppler imaging. Femoral Vein: No evidence of thrombus. Normal compressibility, respiratory phasicity and response to augmentation. Popliteal Vein: No evidence of thrombus. Normal compressibility, respiratory phasicity and response to augmentation. Calf Veins: Extremely limited visualization of calf veins. Superficial Great Saphenous Vein: No evidence of thrombus. Normal compressibility. Venous Reflux:  None. Other Findings: No evidence of superficial thrombophlebitis or abnormal fluid collection. IMPRESSION: No evidence of deep venous thrombosis in either lower extremity. Evaluation of bilateral calf veins was extremely limited due to body habitus and edema. Electronically Signed   By: Aletta Edouard M.D.   On: 07/09/2019 12:57   DG CHEST PORT 1 VIEW  Result Date: 07/12/2019 CLINICAL DATA:  Dyspnea. EXAM: PORTABLE CHEST 1 VIEW COMPARISON:  July 08, 2019. FINDINGS: Stable cardiomediastinal silhouette. Mild central pulmonary vascular congestion is noted with bibasilar opacities concerning for edema or possibly inflammation. No pneumothorax is noted. Right internal jugular Port-A-Cath is unchanged in position. Small bilateral pleural effusions may be present. Bony thorax is unremarkable. IMPRESSION: Mild central pulmonary vascular congestion is noted with bibasilar opacities concerning for edema or possibly inflammation. Electronically Signed   By: Marijo Conception M.D.   On: 07/12/2019 11:41   DG Chest Portable 1 View  Result  Date: 07/08/2019 CLINICAL DATA:  74 year old female with weakness. EXAM: PORTABLE CHEST 1 VIEW COMPARISON:  Chest radiograph dated 01/15/2019 and CT dated 01/15/2019. FINDINGS: Right sided Port-A-Cath in similar position. There is mild cardiomegaly with vascular congestion and probable trace bilateral pleural effusions. No focal consolidation or pneumothorax. No acute osseous pathology. Surgical clips of the left breast noted. IMPRESSION: Mild cardiomegaly with vascular congestion and probable trace bilateral pleural effusions. No focal consolidation. Electronically Signed   By: Anner Crete M.D.   On: 07/08/2019 16:26   ECHOCARDIOGRAM COMPLETE  Result Date: 07/09/2019   ECHOCARDIOGRAM REPORT   Patient Name:   Destiny Nash Date of Exam: 07/09/2019 Medical Rec #:  [id number]        Height:       66.0 in Accession #:    [id number]       Weight:       285.1 lb Date of Birth:  [birth year]       BSA:          2.32 m Patient Age:    73 years         BP:           134/58  mmHg Patient Gender: F                HR:           88 bpm. Exam Location:  [facility] Procedure: 2D Echo, Cardiac Doppler and Color Doppler Indications:    CHF-Acute Diastolic 220.25 / K27.06  History:        Patient has prior history of Echocardiogram examinations, most                 recent 02/11/2019. CHF, Arrythmias:Atrial Fibrillation; Risk                 Factors:Hypertension, Diabetes and Dyslipidemia. Breast cancer.  Sonographer:    Alvino Chapel RCS Referring Phys: [phone] [place]  1. Left ventricular ejection fraction, by visual estimation, is 65 to 70%. The left ventricle has hyperdynamic function. There is mildly increased left ventricular hypertrophy.  2. The left ventricle has no regional wall motion abnormalities.  3. Global right ventricle has normal systolic function.The right ventricular size is normal. No increase in right ventricular wall thickness.  4. Left atrial size was normal.  5. Right atrial size was  normal.  6. Mild mitral annular calcification.  7. The mitral valve is grossly normal. Moderate mitral valve regurgitation.  8. The tricuspid valve is grossly normal.  9. The tricuspid valve is grossly normal. Tricuspid valve regurgitation is mild. 10. The aortic valve is tricuspid. Aortic valve regurgitation is not visualized. No evidence of aortic valve sclerosis or stenosis. 11. The pulmonic valve was grossly normal. Pulmonic valve regurgitation is not visualized. 12. The inferior vena cava is dilated in size with <50% respiratory variability, suggesting right atrial pressure of 15 mmHg. FINDINGS  Left Ventricle: Left ventricular ejection fraction, by visual estimation, is 65 to 70%. The left ventricle has hyperdynamic function. The left ventricle has no regional wall motion abnormalities. The left ventricular internal cavity size was the left ventricle is normal in size. There is mildly increased left ventricular hypertrophy. Concentric left ventricular hypertrophy. Left ventricular diastolic parameters were normal. Right Ventricle: The right ventricular size is normal. No increase in right ventricular wall thickness. Global RV systolic function is has normal systolic function. Left Atrium: Left atrial size was normal in size. Right Atrium: Right atrial size was normal in size Pericardium: There is no evidence of pericardial effusion. Mitral Valve: The mitral valve is grossly normal. Mild mitral annular calcification. Moderate mitral valve regurgitation. Tricuspid Valve: The tricuspid valve is grossly normal. Tricuspid valve regurgitation is mild. Aortic Valve: The aortic valve is tricuspid. . There is mild thickening of the aortic valve. Aortic valve regurgitation is not visualized. The aortic valve is structurally normal, with no evidence of sclerosis or stenosis. Mild aortic valve annular calcification. There is mild thickening of the aortic valve. Pulmonic Valve: The pulmonic valve was grossly normal.  Pulmonic valve regurgitation is not visualized. Pulmonic regurgitation is not visualized. Aorta: The aortic root is normal in size and structure. Venous: The inferior vena cava is dilated in size with less than 50% respiratory variability, suggesting right atrial pressure of 15 mmHg. IAS/Shunts: No atrial level shunt detected by color flow Doppler.  LEFT VENTRICLE PLAX 2D LVIDd:         4.46 cm  Diastology LVIDs:         1.99 cm  LV e' lateral:   17.00 cm/s LV PW:         1.21 cm  LV E/e' lateral: 8.3 LV  IVS:        1.29 cm  LV e' medial:    10.60 cm/s LVOT diam:     1.80 cm  LV E/e' medial:  13.3 LV SV:         78 ml LV SV Index:   30.87 LVOT Area:     2.54 cm  RIGHT VENTRICLE RV S prime:     14.40 cm/s TAPSE (M-mode): 1.9 cm LEFT ATRIUM             Index       RIGHT ATRIUM           Index LA diam:        3.00 cm 1.29 cm/m  RA Area:     18.90 cm LA Vol (A2C):   79.7 ml 34.28 ml/m RA Volume:   55.90 ml  24.04 ml/m LA Vol (A4C):   75.8 ml 32.60 ml/m LA Biplane Vol: 78.1 ml 33.59 ml/m  AORTIC VALVE LVOT Vmax:   123.00 cm/s LVOT Vmean:  82.000 cm/s LVOT VTI:    0.248 m  AORTA Ao Root diam: 3.10 cm MITRAL VALVE MV Area (PHT): 4.31 cm             SHUNTS MV PHT:        51.04 msec           Systemic VTI:  0.25 m MV Decel Time: 176 msec             Systemic Diam: 1.80 cm MV E velocity: 141.00 cm/s 103 cm/s  Kate Sable MD Electronically signed by Kate Sable MD Signature Date/Time: 07/09/2019/9:29:55 AM    Final      Subjective: Pt without complaints.   Discharge Exam: Vitals:   07/14/19 0900 07/14/19 1012  BP:    Pulse: 79   Resp: 20   Temp: 97.9 F (36.6 C)   SpO2: 100% 99%   Vitals:   07/14/19 0500 07/14/19 0533 07/14/19 0900 07/14/19 1012  BP:  (!) 131/58    Pulse:  75 79   Resp:  20 20   Temp:  98.5 F (36.9 C) 97.9 F (36.6 C)   TempSrc:  Oral Oral   SpO2:  100% 100% 99%  Weight: 131.7 kg     Height:        General: Pt is alert, awake, not in acute  distress Cardiovascular: RRR, S1/S2 +, no rubs, no gallops Respiratory: CTA bilaterally, no wheezing, no rhonchi Abdominal: Soft, NT, ND, bowel sounds + Extremities: swollen right upper extremity appears to be IV infiltration, weeping fluid, fully resolved cellulitis BLEs, chronic venous stasis, lymphedema BLEs, no cyanosis   The results of significant diagnostics from this hospitalization (including imaging, microbiology, ancillary and laboratory) are listed below for reference.     Microbiology: Recent Results (from the past 240 hour(s))  Respiratory Panel by RT PCR (Flu A&B, Covid) - Nasopharyngeal Swab     Status: None   Collection Time: 07/08/19  5:34 PM   Specimen: Nasopharyngeal Swab  Result Value Ref Range Status   SARS Coronavirus 2 by RT PCR NEGATIVE NEGATIVE Final    Comment: (NOTE) SARS-CoV-2 target nucleic acids are NOT DETECTED. The SARS-CoV-2 RNA is generally detectable in upper respiratoy specimens during the acute phase of infection. The lowest concentration of SARS-CoV-2 viral copies this assay can detect is 131 copies/mL. A negative result does not preclude SARS-Cov-2 infection and should not be used as the sole basis for treatment or other patient  management decisions. A negative result may occur with  improper specimen collection/handling, submission of specimen other than nasopharyngeal swab, presence of viral mutation(s) within the areas targeted by this assay, and inadequate number of viral copies (<131 copies/mL). A negative result must be combined with clinical observations, patient history, and epidemiological information. The expected result is Negative. Fact Sheet for Patients:  PinkCheek.be Fact Sheet for Healthcare Providers:  GravelBags.it This test is not yet ap proved or cleared by the [place] FDA and  has been authorized for detection and/or diagnosis of SARS-CoV-2 by FDA under an  Emergency Use Authorization (EUA). This EUA will remain  in effect (meaning this test can be used) for the duration of the COVID-19 declaration under Section 564(b)(1) of the Act, 21 U.S.C. section 360bbb-3(b)(1), unless the authorization is terminated or revoked sooner.    Influenza A by PCR NEGATIVE NEGATIVE Final   Influenza B by PCR NEGATIVE NEGATIVE Final    Comment: (NOTE) The Xpert Xpress SARS-CoV-2/FLU/RSV assay is intended as an aid in  the diagnosis of influenza from Nasopharyngeal swab specimens and  should not be used as a sole basis for treatment. Nasal washings and  aspirates are unacceptable for Xpert Xpress SARS-CoV-2/FLU/RSV  testing. Fact Sheet for Patients: PinkCheek.be Fact Sheet for Healthcare Providers: GravelBags.it This test is not yet approved or cleared by the [place] FDA and  has been authorized for detection and/or diagnosis of SARS-CoV-2 by  FDA under an Emergency Use Authorization (EUA). This EUA will remain  in effect (meaning this test can be used) for the duration of the  Covid-19 declaration under Section 564(b)(1) of the Act, 21  U.S.C. section 360bbb-3(b)(1), unless the authorization is  terminated or revoked. Performed at [facility], [address] [place], [place] [address]   Blood culture (routine x 2)     Status: None   Collection Time: 07/08/19  5:40 PM   Specimen: Right Antecubital; Blood  Result Value Ref Range Status   Specimen Description RIGHT ANTECUBITAL  Final   Special Requests   Final    BOTTLES DRAWN AEROBIC AND ANAEROBIC Blood Culture adequate volume   Culture   Final    NO GROWTH 5 DAYS Performed at [facility], [address], [place], [place] [address]    Report Status 07/13/2019 FINAL  Final  Blood culture (routine x 2)     Status: None   Collection Time: 07/08/19  6:07 PM   Specimen: BLOOD RIGHT HAND  Result Value Ref Range Status   Specimen Description  BLOOD RIGHT HAND  Final   Special Requests   Final    BOTTLES DRAWN AEROBIC ONLY Blood Culture adequate volume   Culture   Final    NO GROWTH 5 DAYS Performed at [facility], [address], [place], [place] [address]    Report Status 07/13/2019 FINAL  Final  MRSA PCR Screening     Status: Abnormal   Collection Time: 07/08/19 10:12 PM   Specimen: Nasal Mucosa; Nasopharyngeal  Result Value Ref Range Status   MRSA by PCR POSITIVE (A) NEGATIVE Final    Comment:        The GeneXpert MRSA Assay (FDA approved for NASAL specimens only), is one component of a comprehensive MRSA colonization surveillance program. It is not intended to diagnose MRSA infection nor to guide or monitor treatment for MRSA infections. RESULT CALLED TO, READ BACK BY AND VERIFIED WITH: J RHEW,RN @0004  07/09/19 M[facility] Performed at [facility], [address]  [place], [place] [address]   Culture, Urine     Status: None   Collection Time: 07/09/19  2:05 AM   Specimen: Urine, Catheterized  Result Value Ref Range Status   Specimen Description   Final    URINE, CATHETERIZED Performed at [facility], [address], [place], [place] [address]    Special Requests   Final    NONE Performed at [facility], [address] [place], [place] [address]    Culture   Final    NO GROWTH Performed at [facility], [place] [address], [place], [place] [address]    Report Status 07/10/2019 FINAL  Final     Labs: BNP (last 3 results) Recent Labs    01/15/19 1732 07/08/19 1807  BNP 71.0 30.0   Basic Metabolic Panel: Recent Labs  Lab 07/10/19 0414 07/11/19 0540 07/12/19 0632 07/13/19 0609 07/14/19 0447  NA 136 135 135 136 135  K 3.8 2.5* 2.7* 2.8* 3.2*  CL 100 100 100 100 102  CO2 23 25 26 27 26   GLUCOSE 75 169* 202* 192* 195*  BUN 45* 36* 34* 30* 29*  CREATININE 1.21* 1.08* 1.05* 0.97 0.94  CALCIUM 8.6* 8.0* 8.4* 8.2* 8.2*  MG 1.3* 1.5* 1.8 1.5* 1.8   Liver Function Tests: Recent Labs  Lab  07/09/19 0540 07/10/19 0414 07/11/19 0540 07/12/19 0632 07/13/19 0609  AST 53* 59* 53* 51* 44*  ALT 22 24 21 22 19   ALKPHOS 222* 219* 209* 208* 181*  BILITOT 1.8* 1.5* 1.5* 1.5* 1.5*  PROT 6.3* 5.8* 5.9* 6.4* 5.9*  ALBUMIN 2.6* 2.5* 2.3* 2.5* 2.3*   Recent Labs  Lab 07/08/19 1527  LIPASE 17   No results for input(s): AMMONIA in the last 168 hours. CBC: Recent Labs  Lab 07/09/19 0540 07/10/19 0414 07/11/19 0540 07/12/19 0632 07/13/19 0609  WBC 15.4* 11.9* 10.9* 11.5* 10.6*  NEUTROABS  --  9.8* 8.6* 8.9* 7.8*  HGB 8.1* 7.5* 7.6* 8.3* 8.1*  HCT 26.8* 25.0* 24.8* 28.1* 26.9*  MCV 74.7* 73.7* 74.3* 75.1* 74.5*  PLT 132* 108* 120* 121* 122*   Cardiac Enzymes: No results for input(s): CKTOTAL, CKMB, CKMBINDEX, TROPONINI in the last 168 hours. BNP: Invalid input(s): POCBNP CBG: Recent Labs  Lab 07/13/19 1058 07/13/19 1650 07/13/19 2105 07/14/19 0745 07/14/19 1109  GLUCAP 178* 182* 219* 158* 162*   D-Dimer No results for input(s): DDIMER in the last 72 hours. Hgb A1c No results for input(s): HGBA1C in the last 72 hours. Lipid Profile No results for input(s): CHOL, HDL, LDLCALC, TRIG, CHOLHDL, LDLDIRECT in the last 72 hours. Thyroid function studies No results for input(s): TSH, T4TOTAL, T3FREE, THYROIDAB in the last 72 hours.  Invalid input(s): FREET3 Anemia work up No results for input(s): VITAMINB12, FOLATE, FERRITIN, TIBC, IRON, RETICCTPCT in the last 72 hours. Urinalysis    Component Value Date/Time   COLORURINE YELLOW 07/09/2019 0205   APPEARANCEUR HAZY (A) 07/09/2019 0205   LABSPEC 1.015 07/09/2019 0205   PHURINE 5.0 07/09/2019 0205   GLUCOSEU NEGATIVE 07/09/2019 0205   HGBUR NEGATIVE 07/09/2019 0205   BILIRUBINUR NEGATIVE 07/09/2019 0205   KETONESUR NEGATIVE 07/09/2019 0205   PROTEINUR 30 (A) 07/09/2019 0205   NITRITE NEGATIVE 07/09/2019 0205   LEUKOCYTESUR NEGATIVE 07/09/2019 0205   Sepsis Labs Invalid input(s): PROCALCITONIN,  WBC,   LACTICIDVEN Microbiology Recent Results (from the past 240 hour(s))  Respiratory Panel by RT PCR (Flu A&B, Covid) - Nasopharyngeal Swab     Status: None   Collection Time:  07/08/19  5:34 PM   Specimen: Nasopharyngeal Swab  Result Value Ref Range Status   SARS Coronavirus 2 by RT PCR NEGATIVE NEGATIVE Final    Comment: (NOTE) SARS-CoV-2 target nucleic acids are NOT DETECTED. The SARS-CoV-2 RNA is generally detectable in upper respiratoy specimens during the acute phase of infection. The lowest concentration of SARS-CoV-2 viral copies this assay can detect is 131 copies/mL. A negative result does not preclude SARS-Cov-2 infection and should not be used as the sole basis for treatment or other patient management decisions. A negative result may occur with  improper specimen collection/handling, submission of specimen other than nasopharyngeal swab, presence of viral mutation(s) within the areas targeted by this assay, and inadequate number of viral copies (<131 copies/mL). A negative result must be combined with clinical observations, patient history, and epidemiological information. The expected result is Negative. Fact Sheet for Patients:  PinkCheek.be Fact Sheet for Healthcare Providers:  GravelBags.it This test is not yet ap proved or cleared by the [place] FDA and  has been authorized for detection and/or diagnosis of SARS-CoV-2 by FDA under an Emergency Use Authorization (EUA). This EUA will remain  in effect (meaning this test can be used) for the duration of the COVID-19 declaration under Section 564(b)(1) of the Act, 21 U.S.C. section 360bbb-3(b)(1), unless the authorization is terminated or revoked sooner.    Influenza A by PCR NEGATIVE NEGATIVE Final   Influenza B by PCR NEGATIVE NEGATIVE Final    Comment: (NOTE) The Xpert Xpress SARS-CoV-2/FLU/RSV assay is intended as an aid in  the diagnosis of influenza  from Nasopharyngeal swab specimens and  should not be used as a sole basis for treatment. Nasal washings and  aspirates are unacceptable for Xpert Xpress SARS-CoV-2/FLU/RSV  testing. Fact Sheet for Patients: PinkCheek.be Fact Sheet for Healthcare Providers: GravelBags.it This test is not yet approved or cleared by the [place] FDA and  has been authorized for detection and/or diagnosis of SARS-CoV-2 by  FDA under an Emergency Use Authorization (EUA). This EUA will remain  in effect (meaning this test can be used) for the duration of the  Covid-19 declaration under Section 564(b)(1) of the Act, 21  U.S.C. section 360bbb-3(b)(1), unless the authorization is  terminated or revoked. Performed at [facility], [address] [place], [place] [address]   Blood culture (routine x 2)     Status: None   Collection Time: 07/08/19  5:40 PM   Specimen: Right Antecubital; Blood  Result Value Ref Range Status   Specimen Description RIGHT ANTECUBITAL  Final   Special Requests   Final    BOTTLES DRAWN AEROBIC AND ANAEROBIC Blood Culture adequate volume   Culture   Final    NO GROWTH 5 DAYS Performed at [facility], [address] [place], [place] [address]    Report Status 07/13/2019 FINAL  Final  Blood culture (routine x 2)     Status: None   Collection Time: 07/08/19  6:07 PM   Specimen: BLOOD RIGHT HAND  Result Value Ref Range Status   Specimen Description BLOOD RIGHT HAND  Final   Special Requests   Final    BOTTLES DRAWN AEROBIC ONLY Blood Culture adequate volume   Culture   Final    NO GROWTH 5 DAYS Performed at [facility], [address], [place], [place] [address]    Report Status 07/13/2019 FINAL  Final  MRSA PCR Screening     Status: Abnormal   Collection Time: 07/08/19 10:12 PM  Specimen: Nasal Mucosa; Nasopharyngeal  Result Value Ref Range Status   MRSA by PCR POSITIVE (A) NEGATIVE Final    Comment:        The  GeneXpert MRSA Assay (FDA approved for NASAL specimens only), is one component of a comprehensive MRSA colonization surveillance program. It is not intended to diagnose MRSA infection nor to guide or monitor treatment for MRSA infections. RESULT CALLED TO, READ BACK BY AND VERIFIED WITH: J RHEW,RN @0004  07/09/19 [facility] Performed at [facility], [address], [place], [place] [address]   Culture, Urine     Status: None   Collection Time: 07/09/19  2:05 AM   Specimen: Urine, Catheterized  Result Value Ref Range Status   Specimen Description   Final    URINE, CATHETERIZED Performed at [facility], [address] [place], [place] [address]    Special Requests   Final    NONE Performed at [facility], [address] [place], [place] [address]    Culture   Final    NO GROWTH Performed at [facility], [place] [address], [place],  [address]    Report Status 07/10/2019 FINAL  Final   Time coordinating discharge: 40 minutes   SIGNED:  Irwin Brakeman, MD  [facility] 07/14/2019, 12:22 PM How to contact the [facility] Attending or Consulting provider [place] or covering provider during after hours [place], for this patient?  1. Check the care team in [facility] and look for a) attending/consulting [facility] provider listed and b) the [facility] team listed 2. Log into [email or web address] and use [place]'s universal password to access. If you do not have the password, please contact the hospital operator. 3. Locate the [facility] provider you are looking for under [facility] and page to a number that you can be directly reached. 4. If you still have difficulty reaching the provider, please page the [facility] (Director on Call) for the Hospitalists listed on amion for assistance.

## 2019-07-14 NOTE — Plan of Care (Signed)

## 2019-07-16 ENCOUNTER — Encounter: Payer: Self-pay | Admitting: *Deleted

## 2019-07-16 ENCOUNTER — Encounter: Payer: Self-pay | Admitting: Internal Medicine

## 2019-07-16 ENCOUNTER — Non-Acute Institutional Stay (SKILLED_NURSING_FACILITY): Payer: Medicare Other | Admitting: Internal Medicine

## 2019-07-16 DIAGNOSIS — E1165 Type 2 diabetes mellitus with hyperglycemia: Secondary | ICD-10-CM | POA: Diagnosis not present

## 2019-07-16 DIAGNOSIS — Z794 Long term (current) use of insulin: Secondary | ICD-10-CM

## 2019-07-16 DIAGNOSIS — K7581 Nonalcoholic steatohepatitis (NASH): Secondary | ICD-10-CM | POA: Diagnosis not present

## 2019-07-16 DIAGNOSIS — K8020 Calculus of gallbladder without cholecystitis without obstruction: Secondary | ICD-10-CM

## 2019-07-16 DIAGNOSIS — E782 Mixed hyperlipidemia: Secondary | ICD-10-CM | POA: Diagnosis not present

## 2019-07-16 DIAGNOSIS — I5033 Acute on chronic diastolic (congestive) heart failure: Secondary | ICD-10-CM

## 2019-07-16 DIAGNOSIS — R748 Abnormal levels of other serum enzymes: Secondary | ICD-10-CM

## 2019-07-16 DIAGNOSIS — I10 Essential (primary) hypertension: Secondary | ICD-10-CM | POA: Diagnosis not present

## 2019-07-16 DIAGNOSIS — K7460 Unspecified cirrhosis of liver: Secondary | ICD-10-CM

## 2019-07-16 DIAGNOSIS — L03115 Cellulitis of right lower limb: Secondary | ICD-10-CM | POA: Diagnosis not present

## 2019-07-16 DIAGNOSIS — A419 Sepsis, unspecified organism: Secondary | ICD-10-CM

## 2019-07-16 DIAGNOSIS — E876 Hypokalemia: Secondary | ICD-10-CM | POA: Diagnosis not present

## 2019-07-16 DIAGNOSIS — J9601 Acute respiratory failure with hypoxia: Secondary | ICD-10-CM

## 2019-07-16 DIAGNOSIS — R6520 Severe sepsis without septic shock: Secondary | ICD-10-CM | POA: Diagnosis not present

## 2019-07-16 DIAGNOSIS — I4891 Unspecified atrial fibrillation: Secondary | ICD-10-CM | POA: Diagnosis not present

## 2019-07-16 NOTE — Progress Notes (Signed)
: Provider:  Noah Delaine. Sheppard Coil, MD Location:  [place] Room Number: 149-W Place of Service:  SNF [phone])  PCP: [name], MD Patient Care Team: [name], MD as PCP - General (Family Medicine) Harl Bowie Alphonse Guild, MD as PCP - Cardiology (Cardiology) Mauro Kaufmann, RN as Oncology Nurse Navigator Rockwell Germany, RN as Oncology Nurse Navigator  Extended Emergency Contact Information Primary Emergency Contact: Sharol Harness, [place] [address] [address]itter of [place] Phone: [phone] Mobile Phone: [phone] Relation: Daughter     Allergies: Levaquin [levofloxacin] and Codeine  Chief Complaint  Patient presents with  . New Admit To SNF    New admission to [facility]    HPI: Patient is a 74 y.o. female with breast cancer on chemotherapy, diabetes mellitus, hypertension, atrial flutter normal presented to the ED with complaints of generalized weakness over the prior several days with some difficulty breathing especially with exertion and she thinks her legs may be more swollen than they had prior.  Patient is currently being treated for cellulitis involving both lower extremities with doxycycline.  The redness in her lower extremities has not changed significantly.  She is also having 4 episodes of vomiting today.  In the [facility], ED patient was found to be tachycardic to 102 temperature was normal, blood pressure was normal, O2 sats down to 84% on room air, creatinine was elevated to 1.4 up from 0.81, potassium millages 6.2 and lactic acidosis 4.4 portable chest x-ray showed mild cardiomegaly with vascular congestion and bilateral pleural effusions patient was treated with 1 L normal saline in the ED patient.  Patient is admitted to [facility] 0.2/2-8 where she was treated for hyperkalemia with replacement mild diastolic congestive heart failure exacerbation treated with IV Lasix and sepsis secondary to cellulitis  and cellulitis treated with IV vancomycin cefepime and Flagyl.  Patient was found to have poorly controlled diabetes.  Patient had elevated transaminases and cholelithiasis with thickened gallbladder walls.  Patient did not have abdominal pain and tolerated diet and did not require a cholecystostomy tube at the time of discharge.  Patient is admitted to skilled nursing facility for OT/PT while at skilled nursing facility patient will be followed for hypertension treated with Cardizem, atrial fibrillation treated with Cardizem and Eliquis, and diabetes treated with Metformin and insulin.  Past Medical History:  Diagnosis Date  . Anemia    in the past   . Anxiety   . Atrial fibrillation ([place])    a. diagnosed in 02/2018.  . Bronchitis    easily  develops bronchitis when she has a cold  . Cancer ([place])    L breast - CA, Feb. 4th   . Cellulitis   . Chronic kidney disease   . Diabetes mellitus without complication ([place])    type 2  . Dyspnea   . Dysrhythmia    afib, treating with Eliquis  . Family history of breast cancer   . Family history of uterine cancer   . Heart murmur   . Hyperlipidemia   . Hypertension     Past Surgical History:  Procedure Laterality Date  . BREAST LUMPECTOMY WITH RADIOACTIVE SEED AND SENTINEL LYMPH NODE BIOPSY Left 12/10/2018   Procedure: LEFT BREAST LUMPECTOMY WITH RADIOACTIVE SEED AND LEFT SENTINEL LYMPH NODE MAPPING;  Surgeon: Erroll Luna, MD;  Location: [place];  Service: General;  Laterality: Left;  . DILATION AND CURETTAGE OF UTERUS  x2 on same day  . PORTACATH PLACEMENT N/A 08/01/2018   Procedure: INSERTION PORT-A-CATH WITH ULTRASOUND;  Surgeon: Erroll Luna, MD;  Location: [place];  Service: General;  Laterality: N/A;  . TONSILLECTOMY AND ADENOIDECTOMY      Allergies as of 07/16/2019      Reactions   Levaquin [levofloxacin] Other (See Comments)   Joint "seize" and constipation   Codeine Other (See Comments)   Severe headache      Medication  List    Notice   This visit is during an admission. Changes to the med list made in this visit will be reflected in the After Visit Summary of the admission.    Current Outpatient Medications on File Prior to Visit  Medication Sig Dispense Refill  . acetaminophen (TYLENOL) 500 MG tablet Take 1,000 mg by mouth every 8 (eight) hours as needed for moderate pain or headache.     . albuterol (PROVENTIL HFA;VENTOLIN HFA) 108 (90 Base) MCG/ACT inhaler Inhale 2 puffs into the lungs every 6 (six) hours as needed for wheezing or shortness of breath.    Marland Kitchen apixaban (ELIQUIS) 5 MG TABS tablet Take 1 tablet (5 mg total) by mouth 2 (two) times daily. 60 tablet 0  . Balsam [place]-Castor Oil (VENELEX) OINT Apply 1 application topically 3 (three) times daily.    . cholestyramine (QUESTRAN) 4 g packet Take 1 packet (4 g total) by mouth daily as needed (ITCHING).    Marland Kitchen diltiazem (CARDIZEM CD) 300 MG 24 hr capsule Take 1 capsule (300 mg total) by mouth daily. 90 capsule 3  . diphenoxylate-atropine (LOMOTIL) 2.5-0.025 MG tablet TAKE 1 TABLET BY MOUTH 4 TIMES DAILY AS NEEDED FOR DIARRHEA OR  LOOSE  STOOLS 120 tablet 0  . docusate sodium (COLACE) 100 MG capsule Take 1 capsule (100 mg total) by mouth 2 (two) times daily. 10 capsule 0  . furosemide (LASIX) 40 MG tablet Take 60 mg by mouth daily. Take 1.5 tabs to = 60 mg    . insulin detemir (LEVEMIR) 100 UNIT/ML injection Inject 0.08 mLs (8 Units total) into the skin at bedtime.    Marland Kitchen LORazepam (ATIVAN) 0.5 MG tablet Take 1 tablet (0.5 mg total) by mouth 2 (two) times daily as needed for anxiety. 10 tablet 0  . Magnesium Oxide 400 MG CAPS Take 1 capsule (400 mg total) by mouth daily. 90 capsule 3  . megestrol (MEGACE) 400 MG/10ML suspension Take 10 mLs (400 mg total) by mouth 2 (two) times daily. 240 mL 0  . metFORMIN (GLUCOPHAGE) 500 MG tablet Take 1,000 mg by mouth 2 (two) times daily with a meal.     . niacin 500 MG tablet Take 500 mg by mouth daily.    . NYSTATIN  powder Apply 1 Bottle topically daily as needed (irritation).     . Omega-3 1000 MG CAPS Take 1,000 mg by mouth 2 (two) times daily.    . ondansetron (ZOFRAN) 8 MG tablet Take 1 tablet (8 mg total) by mouth 2 (two) times daily as needed (Nausea or vomiting). 30 tablet 1  . pantoprazole (PROTONIX) 40 MG tablet Take 1 tablet by mouth once daily 90 tablet 0  . potassium chloride SA (KLOR-CON) 20 MEQ tablet Take 1 tablet (20 mEq total) by mouth 2 (two) times daily. 60 tablet 2  . pravastatin (PRAVACHOL) 20 MG tablet Take 20 mg by mouth daily before breakfast.   0  . prochlorperazine (COMPAZINE) 10 MG tablet Take 1 tablet (10 mg total) by  mouth every 6 (six) hours as needed (Nausea or vomiting). 30 tablet 1  . traMADol (ULTRAM) 50 MG tablet Take 1 tablet (50 mg total) by mouth every 6 (six) hours as needed for moderate pain. 10 tablet 0   No current facility-administered medications on file prior to visit.     No orders of the defined types were placed in this encounter.   Immunization History  Administered Date(s) Administered  . Fluad Quad(high Dose 65+) 03/25/2019  . Pneumococcal Conjugate-13 01/28/2015  . Pneumococcal Polysaccharide-23 07/11/2018  . Tdap 05/22/2017    Social History   Tobacco Use  . Smoking status: Never Smoker  . Smokeless tobacco: Never Used  Substance Use Topics  . Alcohol use: Not Currently    Family history is   Family History  Problem Relation Age of Onset  . CAD Brother   . Hypertension Mother   . Hyperlipidemia Mother   . Hypertension Brother   . Uterine cancer Maternal Grandmother   . Breast cancer Cousin        dx in her 74s; mat first cousin  . Breast cancer Cousin        dx in her 5s-60s; mat first cousin      Review of Systems  GENERAL:  no fevers, fatigue, appetite changes SKIN: No itching, or rash EYES: No eye pain, redness, discharge EARS: No earache, tinnitus, change in hearing NOSE: No congestion, drainage or bleeding    MOUTH/THROAT: No mouth or tooth pain, No sore throat RESPIRATORY: No cough, wheezing, SOB CARDIAC: No chest pain, palpitations, lower extremity edema  GI: No abdominal pain, No N/V/D or constipation, No heartburn or reflux  GU: No dysuria, frequency or urgency, or incontinence  MUSCULOSKELETAL: No unrelieved bone/joint pain NEUROLOGIC: No headache, dizziness or focal weakness PSYCHIATRIC: No c/o anxiety or sadness   Vitals:   07/16/19 1251  BP: (!) 144/72  Pulse: (!) 55  Resp: 16  Temp: 98.7 F (37.1 C)    SpO2 Readings from Last 1 Encounters:  07/14/19 99%   Body mass index is 46.86 kg/m.     Physical Exam  GENERAL APPEARANCE: Alert, conversant,  No acute distress.  SKIN: No diaphoresis rash HEAD: Normocephalic, atraumatic  EYES: Conjunctiva/lids clear. Pupils round, reactive. EOMs intact.  EARS: External exam WNL, canals clear. Hearing grossly normal.  NOSE: No deformity or discharge.  MOUTH/THROAT: Lips w/o lesions  RESPIRATORY: Breathing is even, unlabored. Lung sounds are clear   CARDIOVASCULAR: Heart irregular, 4/6 systolic murmur, no rubs or gallops.  Edema right upper extremity from IV infiltration and 2+ lower extremity what I with peripheral edema.   GASTROINTESTINAL: Abdomen is soft, non-tender, not distended w/ normal bowel sounds. GENITOURINARY: Bladder non tender, not distended  MUSCULOSKELETAL: No abnormal joints or musculature NEUROLOGIC:  Cranial nerves 2-12 grossly intact. Moves all extremities  PSYCHIATRIC: Mood and affect appropriate to situation, no behavioral issues  Patient Active Problem List   Diagnosis Date Noted  . Abnormal CT scan, gallbladder   . AKI (acute kidney injury) ([place])   . Dyspnea   . Elevated liver enzymes   . Hyperkalemia   . Palliative care by specialist   . Pressure injury of skin 07/09/2019  . Liver cirrhosis secondary to NASH (nonalcoholic steatohepatitis) ([place]) 07/09/2019  . Ascites 07/09/2019  . Cholelithiasis with  gallbladder wall thickening 07/09/2019  . Chronic anticoagulation 07/09/2019  . Generalized weakness 07/08/2019  . Breast cancer ([place]) 07/08/2019  . Diastolic congestive heart failure ([place]) 07/08/2019  . Hypoglycemia  10/20/2018  . Atrial fibrillation ([place]) 10/20/2018  . Type 2 diabetes mellitus ([place]) 10/20/2018  . Genetic testing 10/14/2018  . Family history of breast cancer   . Family history of uterine cancer   . Port-A-Cath in place 08/09/2018  . Malignant neoplasm of upper-outer quadrant of left breast in female, estrogen receptor positive ([place]) 07/25/2018  . Unspecified atrial fibrillation ([place]) 02/14/2018  . Cellulitis, leg 02/13/2018  . Uncontrolled type 2 diabetes mellitus with hyperglycemia, with long-term current use of insulin ([place]) 02/13/2018  . Essential hypertension 02/13/2018  . Hyperlipidemia 02/13/2018  . Obesity, Class III, BMI 40-49.9 (morbid obesity) ([place]) 02/13/2018      Labs reviewed: Basic Metabolic Panel:    Component Value Date/Time   NA 135 07/14/2019 0447   K 3.2 (L) 07/14/2019 0447   CL 102 07/14/2019 0447   CO2 26 07/14/2019 0447   GLUCOSE 195 (H) 07/14/2019 0447   BUN 29 (H) 07/14/2019 0447   CREATININE 0.94 07/14/2019 0447   CREATININE 1.05 (H) 07/04/2019 1047   CALCIUM 8.2 (L) 07/14/2019 0447   PROT 5.9 (L) 07/13/2019 0609   ALBUMIN 2.3 (L) 07/13/2019 0609   AST 44 (H) 07/13/2019 0609   AST 27 07/04/2019 1047   ALT 19 07/13/2019 0609   ALT 15 07/04/2019 1047   ALKPHOS 181 (H) 07/13/2019 0609   BILITOT 1.5 (H) 07/13/2019 0609   BILITOT 1.3 (H) 07/04/2019 1047   GFRNONAA >60 07/14/2019 0447   GFRNONAA 53 (L) 07/04/2019 1047   GFRAA >60 07/14/2019 0447   GFRAA >60 07/04/2019 1047    Recent Labs    07/12/19 0632 07/13/19 0609 07/14/19 0447  NA 135 136 135  K 2.7* 2.8* 3.2*  CL 100 100 102  CO2 26 27 26   GLUCOSE 202* 192* 195*  BUN 34* 30* 29*  CREATININE 1.05* 0.97 0.94  CALCIUM 8.4* 8.2* 8.2*  MG 1.8 1.5* 1.8   Liver  Function Tests: Recent Labs    07/11/19 0540 07/12/19 0632 07/13/19 0609  AST 53* 51* 44*  ALT 21 22 19   ALKPHOS 209* 208* 181*  BILITOT 1.5* 1.5* 1.5*  PROT 5.9* 6.4* 5.9*  ALBUMIN 2.3* 2.5* 2.3*   Recent Labs    07/08/19 1527  LIPASE 17   No results for input(s): AMMONIA in the last 8760 hours. CBC: Recent Labs    07/11/19 0540 07/12/19 0632 07/13/19 0609  WBC 10.9* 11.5* 10.6*  NEUTROABS 8.6* 8.9* 7.8*  HGB 7.6* 8.3* 8.1*  HCT 24.8* 28.1* 26.9*  MCV 74.3* 75.1* 74.5*  PLT 120* 121* 122*   Lipid No results for input(s): CHOL, HDL, LDLCALC, TRIG in the last 8760 hours.  Cardiac Enzymes: No results for input(s): CKTOTAL, CKMB, CKMBINDEX, TROPONINI in the last 8760 hours. BNP: Recent Labs    01/15/19 1732 07/08/19 1807  BNP 71.0 78.0   No results found for: [facility] Lab Results  Component Value Date   HGBA1C 8.1 (H) 08/09/2018   Lab Results  Component Value Date   TSH 1.862 02/14/2018   Lab Results  Component Value Date   VITAMINB12 347 08/09/2018   Lab Results  Component Value Date   FOLATE 6.8 08/09/2018   Lab Results  Component Value Date   IRON 20 (L) 04/04/2019   TIBC 368 04/04/2019   FERRITIN 55 04/04/2019    Imaging and Procedures obtained prior to SNF admission: US Venous Img Upper Uni Right(DVT)  Result Date: 07/14/2019 CLINICAL DATA:  Right upper extremity edema for 3 days EXAM:  RIGHT UPPER EXTREMITY VENOUS DOPPLER ULTRASOUND TECHNIQUE: Gray-scale sonography with graded compression, as well as color Doppler and duplex ultrasound were performed to evaluate the upper extremity deep venous system from the level of the subclavian vein and including the jugular, axillary, basilic, radial, ulnar and upper cephalic vein. Spectral Doppler was utilized to evaluate flow at rest and with distal augmentation maneuvers. COMPARISON:  None. FINDINGS: Contralateral Subclavian Vein: Respiratory phasicity is normal and symmetric with the symptomatic  side. No evidence of thrombus. Normal compressibility. Internal Jugular Vein: No evidence of thrombus. Normal compressibility, respiratory phasicity and response to augmentation. Subclavian Vein: No evidence of thrombus. Normal compressibility, respiratory phasicity and response to augmentation. Axillary Vein: No evidence of thrombus. Normal compressibility, respiratory phasicity and response to augmentation. Cephalic Vein: No evidence of thrombus. Normal compressibility, respiratory phasicity and response to augmentation. Basilic Vein: No evidence of thrombus. Normal compressibility, respiratory phasicity and response to augmentation. Brachial Veins: No evidence of thrombus. Normal compressibility, respiratory phasicity and response to augmentation. Radial Veins: No evidence of thrombus. Normal compressibility, respiratory phasicity and response to augmentation. Ulnar Veins: No evidence of thrombus. Normal compressibility, respiratory phasicity and response to augmentation. Venous Reflux:  None visualized. Other Findings: Mild subcutaneous edema demonstrated throughout the right upper extremity. IMPRESSION: No evidence of DVT within the right upper extremity. Electronically Signed   By: Ilona Sorrel M.D.   On: 07/14/2019 14:21     Not all labs, radiology exams or other studies done during hospitalization come through on my EPIC note; however they are reviewed by me.    Assessment and Plan  Hypokalemia-repleted low magnesium and additional replacement SNF-admitted for OT/PT continue potassium 20 mEq twice daily  Cellulitis bilateral lower extremities/sepsis.  IV vancomycin cefepime and Flagyl; anticoagulated  Mild diastolic congestive heart failure exacerbation-treated with IV Lasix with resolution SNF-continue Lasix 60 mg p.o. daily  Right arm edema-from IV infiltration; elevation SNF-continue elevation bilateral extremity  Liver cirrhosis/elevated liver enzymes/cholelithiasis-follow-up with GI  or liver cirrhosis, cholelithiasis with thickened gallbladder walls suggestive of acute cholecystitis but patient did not have abdominal pain and was tolerating diet and did not require cholecystostomy tube and was a poor surgical candidate SNF-follow-up GI as planned  Diabetes mellitus type 2-uncontrolled, hemoglobin A1c 9.1 SNF-restart Metformin 1000 mg twice daily and Levemir insulin 8 units nightly  Hypertension SNF control; continue cardioversion 20 mg daily and Lasix 60 mg daily  Paroxysmal atrial fibrillation SNF-continue diltiazem 300 mg daily as rate control and Eliquis 5 mg twice daily as prophylaxis  Hyperlipidemia SNF-continue Pravachol 20 mg daily this is and omega 3000 mg twice daily   Time spent greater than 45 minutes;> 50% of time with patient was spent reviewing records, labs, tests and studies, counseling and developing plan of care  Hennie Duos, MD

## 2019-07-17 ENCOUNTER — Other Ambulatory Visit: Payer: Medicare Other

## 2019-07-17 ENCOUNTER — Ambulatory Visit: Payer: Medicare Other

## 2019-07-17 ENCOUNTER — Ambulatory Visit: Payer: Medicare Other | Admitting: Hematology and Oncology

## 2019-07-18 ENCOUNTER — Ambulatory Visit: Payer: Medicare Other

## 2019-07-18 ENCOUNTER — Other Ambulatory Visit: Payer: Medicare Other

## 2019-07-18 ENCOUNTER — Encounter (HOSPITAL_COMMUNITY)
Admission: AD | Admit: 2019-07-18 | Discharge: 2019-07-18 | Disposition: A | Discharge status: Home / Self Care | Payer: Medicare Other | Source: Other Acute Inpatient Hospital

## 2019-07-18 ENCOUNTER — Telehealth: Payer: Self-pay | Admitting: *Deleted

## 2019-07-18 ENCOUNTER — Ambulatory Visit: Payer: Medicare Other | Admitting: Hematology and Oncology

## 2019-07-18 LAB — HEMOGLOBIN AND HEMATOCRIT, BLOOD
HCT: 27.6 % — ABNORMAL LOW (ref 36.0–46.0)
Hemoglobin: 8.3 g/dL — ABNORMAL LOW (ref 12.0–15.0)

## 2019-07-18 NOTE — Telephone Encounter (Signed)
Received call from Destiny Nash, nurse at [facility] in [place] stating pt is experiencing bloody vaginal discharge starting yesterday.  Describes blood as clots.  States the PA at [facility] has examined pt and is requesting pt to be seen by either oncology or her gynecologist.  Per Dr. Jana Hakim (covering for Dr. Lindi Adie), pt needs to be seen by gynecologist for evaluation and treatment.  Delfina, RN verbalized understanding and will schedule pt to be seen by a gyneocologist.

## 2019-07-19 ENCOUNTER — Encounter: Payer: Self-pay | Admitting: Internal Medicine

## 2019-07-19 DIAGNOSIS — A419 Sepsis, unspecified organism: Secondary | ICD-10-CM | POA: Insufficient documentation

## 2019-07-19 DIAGNOSIS — I5033 Acute on chronic diastolic (congestive) heart failure: Secondary | ICD-10-CM | POA: Insufficient documentation

## 2019-07-19 DIAGNOSIS — E876 Hypokalemia: Secondary | ICD-10-CM | POA: Insufficient documentation

## 2019-07-21 ENCOUNTER — Ambulatory Visit (INDEPENDENT_AMBULATORY_CARE_PROVIDER_SITE_OTHER): Payer: Medicare Other | Admitting: Obstetrics & Gynecology

## 2019-07-21 ENCOUNTER — Encounter: Payer: Self-pay | Admitting: Obstetrics & Gynecology

## 2019-07-21 ENCOUNTER — Encounter: Payer: Self-pay | Admitting: Adult Health

## 2019-07-21 ENCOUNTER — Non-Acute Institutional Stay (SKILLED_NURSING_FACILITY): Payer: Medicare Other | Admitting: Adult Health

## 2019-07-21 ENCOUNTER — Other Ambulatory Visit: Payer: Self-pay

## 2019-07-21 VITALS — BP 110/58 | HR 106 | Wt 291.0 lb

## 2019-07-21 DIAGNOSIS — N950 Postmenopausal bleeding: Secondary | ICD-10-CM | POA: Diagnosis not present

## 2019-07-21 NOTE — Progress Notes (Signed)
Patient ID: Destiny Nash, female   DOB: 1945/08/30, 74 y.o.   MRN: [id number] Requested to see for vaginal bleeding, post menopausal from the [facility] Not safe to transfer patient to our bed so I will come to [facility] for exam In the meantime, ordered a sonogram to evaluate pelvic anatomy and endometrial strip Recent CT report stated appears s/p hysteretomy Pt and daughter deny a hysterectomy in the past only tubal ligation Sonogram should be definitive eval for that issue  Florian Buff, MD 07/21/2019 12:14 PM

## 2019-07-21 NOTE — Progress Notes (Signed)
Location:    [facility] Room Number: 149/W Place of Service:  SNF (31)   CODE STATUS: Full Code  Allergies  Allergen Reactions  . Levaquin [Levofloxacin] Other (See Comments)    Joint "seize" and constipation  . Codeine Other (See Comments)    Severe headache    Chief Complaint  Patient presents with  . Acute Visit    Vaginal Bleeding    HPI:  She has been experiencing nonpainful vaginal bleeding for the past several days. She denies any abdominal cramping; no back pain. She is somewhat anxious and nervous about her bleeding. She is due to go to gyn today. Her hgb is stable a this time.   Past Medical History:  Diagnosis Date  . Anemia    in the past   . Anxiety   . Atrial fibrillation ([place])    a. diagnosed in 02/2018.  . Bronchitis    easily  develops bronchitis when she has a cold  . Cancer ([place])    L breast - CA, Feb. 4th   . Cellulitis   . Chronic kidney disease   . Diabetes mellitus without complication ([place])    type 2  . Dyspnea   . Dysrhythmia    afib, treating with Eliquis  . Family history of breast cancer   . Family history of uterine cancer   . Heart murmur   . Hyperlipidemia   . Hypertension     Past Surgical History:  Procedure Laterality Date  . BREAST LUMPECTOMY WITH RADIOACTIVE SEED AND SENTINEL LYMPH NODE BIOPSY Left 12/10/2018   Procedure: LEFT BREAST LUMPECTOMY WITH RADIOACTIVE SEED AND LEFT SENTINEL LYMPH NODE MAPPING;  Surgeon: Erroll Luna, MD;  Location: [facility];  Service: General;  Laterality: Left;  . DILATION AND CURETTAGE OF UTERUS     x2 on same day  . PORTACATH PLACEMENT N/A 08/01/2018   Procedure: INSERTION PORT-A-CATH WITH ULTRASOUND;  Surgeon: Erroll Luna, MD;  Location: [facility];  Service: General;  Laterality: N/A;  . TONSILLECTOMY AND ADENOIDECTOMY      Social History   Socioeconomic History  . Marital status: Widowed    Spouse name: Not on file  . Number of children: Not on file  . Years of  education: Not on file  . Highest education level: Not on file  Occupational History  . Not on file  Tobacco Use  . Smoking status: Never Smoker  . Smokeless tobacco: Never Used  Substance and Sexual Activity  . Alcohol use: Not Currently  . Drug use: Never  . Sexual activity: Not Currently  Other Topics Concern  . Not on file  Social History Narrative   Her husband passed away on 03-Jul-2018.   Religion: Christian.   Social Determinants of Health   Financial Resource Strain:   . Difficulty of Paying Living Expenses: Not on file  Food Insecurity:   . Worried About Charity fundraiser in the Last Year: Not on file  . Ran Out of Food in the Last Year: Not on file  Transportation Needs: No Transportation Needs  . Lack of Transportation (Medical): No  . Lack of Transportation (Non-Medical): No  Physical Activity:   . Days of Exercise per Week: Not on file  . Minutes of Exercise per Session: Not on file  Stress:   . Feeling of Stress : Not on file  Social Connections:   . Frequency of Communication with Friends and Family: Not on file  . Frequency of  Social Gatherings with Friends and Family: Not on file  . Attends Religious Services: Not on file  . Active Member of Clubs or Organizations: Not on file  . Attends Archivist Meetings: Not on file  . Marital Status: Not on file  Intimate Partner Violence: Not At Risk  . Fear of Current or Ex-Partner: No  . Emotionally Abused: No  . Physically Abused: No  . Sexually Abused: No   Family History  Problem Relation Age of Onset  . CAD Brother   . Hypertension Mother   . Hyperlipidemia Mother   . Hypertension Brother   . Uterine cancer Maternal Grandmother   . Breast cancer Cousin        dx in her 61s; mat first cousin  . Breast cancer Cousin        dx in her 54s-60s; mat first cousin      VITAL SIGNS BP 109/70   Pulse 76   Temp (!) 97 F (36.1 C) (Oral)   Resp 20   Ht _0  (1.676 m)   Wt 291 lb (132 kg)    BMI 46.97 kg/m   Outpatient Encounter Medications as of 07/21/2019  Medication Sig  . acetaminophen (TYLENOL) 500 MG tablet Take 1,000 mg by mouth every 8 (eight) hours as needed for moderate pain or headache.   . albuterol (PROVENTIL HFA;VENTOLIN HFA) 108 (90 Base) MCG/ACT inhaler Inhale 2 puffs into the lungs every 6 (six) hours as needed for wheezing or shortness of breath.  . Amino Acids-Protein Hydrolys (FEEDING SUPPLEMENT, PRO-STAT SUGAR FREE 64,) LIQD Take 30 mLs by mouth 2 (two) times daily between meals.  Roseanne Kaufman [place]-Castor Oil (VENELEX) OINT Apply 1 application topically 3 (three) times daily.  . cholestyramine (QUESTRAN) 4 g packet Take 1 packet (4 g total) by mouth daily as needed (ITCHING).  Marland Kitchen diltiazem (CARDIZEM CD) 300 MG 24 hr capsule Take 1 capsule (300 mg total) by mouth daily.  . diphenoxylate-atropine (LOMOTIL) 2.5-0.025 MG tablet TAKE 1 TABLET BY MOUTH 4 TIMES DAILY AS NEEDED FOR DIARRHEA OR  LOOSE  STOOLS  . docusate sodium (COLACE) 100 MG capsule Take 1 capsule (100 mg total) by mouth 2 (two) times daily.  . furosemide (LASIX) 40 MG tablet Take 60 mg by mouth daily. Take 1.5 tabs to = 60 mg  . insulin detemir (LEVEMIR) 100 UNIT/ML injection Inject 0.08 mLs (8 Units total) into the skin at bedtime.  Marland Kitchen LORazepam (ATIVAN) 0.5 MG tablet Take 1 tablet (0.5 mg total) by mouth 2 (two) times daily as needed for anxiety.  . Magnesium Oxide 400 MG CAPS Take 1 capsule (400 mg total) by mouth daily.  . megestrol (MEGACE) 400 MG/10ML suspension Take 10 mLs (400 mg total) by mouth 2 (two) times daily.  . metFORMIN (GLUCOPHAGE) 500 MG tablet Take 1,000 mg by mouth 2 (two) times daily with a meal.   . niacin 500 MG tablet Take 500 mg by mouth daily.  . NON FORMULARY Diet: NAS CHO  . NYSTATIN powder Apply 1 Bottle topically daily as needed (irritation).   . Omega-3 1000 MG CAPS Take 1,000 mg by mouth 2 (two) times daily.  . ondansetron (ZOFRAN) 8 MG tablet Take 1 tablet (8 mg total)  by mouth 2 (two) times daily as needed (Nausea or vomiting).  . pantoprazole (PROTONIX) 40 MG tablet Take 1 tablet by mouth once daily  . potassium chloride SA (KLOR-CON) 20 MEQ tablet Take 1 tablet (20 mEq total) by mouth  2 (two) times daily.  . pravastatin (PRAVACHOL) 20 MG tablet Take 20 mg by mouth daily before breakfast.   . prochlorperazine (COMPAZINE) 10 MG tablet Take 1 tablet (10 mg total) by mouth every 6 (six) hours as needed (Nausea or vomiting).  . traMADol (ULTRAM) 50 MG tablet Take 1 tablet (50 mg total) by mouth every 6 (six) hours as needed for moderate pain.  . [DISCONTINUED] apixaban (ELIQUIS) 5 MG TABS tablet Take 1 tablet (5 mg total) by mouth 2 (two) times daily.   No facility-administered encounter medications on file as of 07/21/2019.     SIGNIFICANT DIAGNOSTIC EXAMS  LABS REVIEWED TODAY  07-13-19: wbc 10.6; hgb 8.1;hct 26.9; mcv 74.5 plt 122; glucose 192; bun 30; creat 0.97; k+ 2.8; na++ 136; ca 8.2; alk phos 181; total bili 1.5 albumin 2.3 mag 1.5 07-14-19: glucose 195; bun 29; creat 0.94; k+ 3.2; na++ 135; ca 8.2; ca 8.7  07-17-19: hgb 8.3; hct 27.6    Review of Systems  Unable to perform ROS: Medical condition (confusion)    Physical Exam Constitutional:      General: She is not in acute distress.    Appearance: She is well-developed. She is obese. She is not diaphoretic.  Neck:     Thyroid: No thyromegaly.  Cardiovascular:     Rate and Rhythm: Normal rate and regular rhythm.     Pulses: Normal pulses.     Heart sounds: Normal heart sounds.  Pulmonary:     Effort: Pulmonary effort is normal. No respiratory distress.     Breath sounds: Normal breath sounds.  Abdominal:     General: Bowel sounds are normal. There is no distension.     Palpations: Abdomen is soft.     Tenderness: There is no abdominal tenderness.  Musculoskeletal:     Cervical back: Neck supple.     Right lower leg: Edema present.     Left lower leg: Edema present.     Comments:  Bilateral lower extremity edema   Lymphadenopathy:     Cervical: No cervical adenopathy.  Skin:    General: Skin is warm and dry.     Comments: Bilateral lower extremities with redness present no warmth present.   Neurological:     Mental Status: She is alert. She is disoriented.  Psychiatric:     Comments: Is anxious        ASSESSMENT/ PLAN:  TODAY  1. Abnormal vaginal bleeding in post menopausal woman: at this time will not make changes; is awaiting GYN appointment will monitor her status.   MD is aware of resident's narcotic use and is in agreement with current plan of care. We will attempt to wean resident as appropriate.  Ok Edwards NP [facility]  Contact [phone] Monday through Friday 8am- 5pm  After hours call [phone]

## 2019-07-22 ENCOUNTER — Non-Acute Institutional Stay (SKILLED_NURSING_FACILITY): Payer: Medicare Other | Admitting: Adult Health

## 2019-07-22 ENCOUNTER — Encounter: Payer: Self-pay | Admitting: Adult Health

## 2019-07-22 ENCOUNTER — Other Ambulatory Visit: Payer: Self-pay | Admitting: *Deleted

## 2019-07-22 ENCOUNTER — Other Ambulatory Visit (HOSPITAL_COMMUNITY)
Admission: RE | Admit: 2019-07-22 | Discharge: 2019-07-22 | Disposition: A | Discharge status: Home / Self Care | Payer: Medicare Other | Source: Skilled Nursing Facility | Attending: Adult Health | Admitting: Adult Health

## 2019-07-22 DIAGNOSIS — N179 Acute kidney failure, unspecified: Secondary | ICD-10-CM | POA: Diagnosis not present

## 2019-07-22 DIAGNOSIS — I5032 Chronic diastolic (congestive) heart failure: Secondary | ICD-10-CM | POA: Insufficient documentation

## 2019-07-22 LAB — BASIC METABOLIC PANEL
Anion gap: 15 (ref 5–15)
BUN: 63 mg/dL — ABNORMAL HIGH (ref 8–23)
CO2: 15 mmol/L — ABNORMAL LOW (ref 22–32)
Calcium: 9 mg/dL (ref 8.9–10.3)
Chloride: 99 mmol/L (ref 98–111)
Creatinine, Ser: 3.3 mg/dL — ABNORMAL HIGH (ref 0.44–1.00)
GFR calc Af Amer: 15 mL/min — ABNORMAL LOW (ref >60)
GFR calc non Af Amer: 13 mL/min — ABNORMAL LOW (ref >60)
Glucose, Bld: 78 mg/dL (ref 70–99)
Potassium: 6.6 mmol/L (ref 3.5–5.1)
Sodium: 129 mmol/L — ABNORMAL LOW (ref 135–145)

## 2019-07-22 NOTE — Patient Outreach (Signed)
Screened for potential [facility] Care Management needs as a benefit of  [employer].  Destiny Nash is currently receiving skilled therapy at [facility].   Writer attended telephonic interdisciplinary team meeting to assess for disposition needs and transition plan for resident.   Facility reports member is max to dependent with therapy. She is experiencing vaginal bleeding. She is scheduled for GYN exam at facility. Per facility, member has plans for ongoing breast cancer treatment. Palliative to follow.   Will continue to follow for transition plans while member resides in SNF.   Destiny Rolling, MSN-Ed, RN,BSN [place] Acute Care Coordinator [phone] [facility]) [phone]  (Toll free office)

## 2019-07-22 NOTE — Progress Notes (Signed)
Location:    [facility] Room Number: 149/W Place of Service:  SNF (31)   CODE STATUS: Full Code  Allergies  Allergen Reactions  . Levaquin [Levofloxacin] Other (See Comments)    Joint "seize" and constipation  . Codeine Other (See Comments)    Severe headache    Chief Complaint  Patient presents with  . Follow-up    Lab Follow Up     HPI:  Her po intake has been poor. She denies any cough or shortness of breath; no nausea or vomiting. She continues to have vaginal bleeding present. Is due to be seen by GYN.    There are no reports of fevers present.   Past Medical History:  Diagnosis Date  . Anemia    in the past   . Anxiety   . Atrial fibrillation ([place])    a. diagnosed in 02/2018.  . Bronchitis    easily  develops bronchitis when she has a cold  . Cancer ([place])    L breast - CA, Feb. 4th   . Cellulitis   . Chronic kidney disease   . Diabetes mellitus without complication ([place])    type 2  . Dyspnea   . Dysrhythmia    afib, treating with Eliquis  . Family history of breast cancer   . Family history of uterine cancer   . Heart murmur   . Hyperlipidemia   . Hypertension     Past Surgical History:  Procedure Laterality Date  . BREAST LUMPECTOMY WITH RADIOACTIVE SEED AND SENTINEL LYMPH NODE BIOPSY Left 12/10/2018   Procedure: LEFT BREAST LUMPECTOMY WITH RADIOACTIVE SEED AND LEFT SENTINEL LYMPH NODE MAPPING;  Surgeon: Erroll Luna, MD;  Location: [place];  Service: General;  Laterality: Left;  . DILATION AND CURETTAGE OF UTERUS     x2 on same day  . PORTACATH PLACEMENT N/A 08/01/2018   Procedure: INSERTION PORT-A-CATH WITH ULTRASOUND;  Surgeon: Erroll Luna, MD;  Location: [place];  Service: General;  Laterality: N/A;  . TONSILLECTOMY AND ADENOIDECTOMY      Social History   Socioeconomic History  . Marital status: Widowed    Spouse name: Not on file  . Number of children: Not on file  . Years of education: Not on file  . Highest  education level: Not on file  Occupational History  . Not on file  Tobacco Use  . Smoking status: Never Smoker  . Smokeless tobacco: Never Used  Substance and Sexual Activity  . Alcohol use: Not Currently  . Drug use: Never  . Sexual activity: Not Currently  Other Topics Concern  . Not on file  Social History Narrative   Her husband passed away on 07-04-18.   Religion: Christian.   Social Determinants of Health   Financial Resource Strain:   . Difficulty of Paying Living Expenses: Not on file  Food Insecurity:   . Worried About Charity fundraiser in the Last Year: Not on file  . Ran Out of Food in the Last Year: Not on file  Transportation Needs: No Transportation Needs  . Lack of Transportation (Medical): No  . Lack of Transportation (Non-Medical): No  Physical Activity:   . Days of Exercise per Week: Not on file  . Minutes of Exercise per Session: Not on file  Stress:   . Feeling of Stress : Not on file  Social Connections:   . Frequency of Communication with Friends and Family: Not on file  . Frequency of Social  Gatherings with Friends and Family: Not on file  . Attends Religious Services: Not on file  . Active Member of Clubs or Organizations: Not on file  . Attends Archivist Meetings: Not on file  . Marital Status: Not on file  Intimate Partner Violence: Not At Risk  . Fear of Current or Ex-Partner: No  . Emotionally Abused: No  . Physically Abused: No  . Sexually Abused: No   Family History  Problem Relation Age of Onset  . CAD Brother   . Hypertension Mother   . Hyperlipidemia Mother   . Hypertension Brother   . Uterine cancer Maternal Grandmother   . Breast cancer Cousin        dx in her 4s; mat first cousin  . Breast cancer Cousin        dx in her 29s-60s; mat first cousin      VITAL SIGNS BP (!) 98/53   Pulse 66   Temp (!) 97 F (36.1 C) (Oral)   Resp 20   Ht 5' 6"  (1.676 m)   Wt 291 lb (132 kg)   BMI 46.97 kg/m    Outpatient Encounter Medications as of 07/22/2019  Medication Sig  . acetaminophen (TYLENOL) 500 MG tablet Take 1,000 mg by mouth every 8 (eight) hours as needed for moderate pain or headache.   . albuterol (PROVENTIL HFA;VENTOLIN HFA) 108 (90 Base) MCG/ACT inhaler Inhale 2 puffs into the lungs every 6 (six) hours as needed for wheezing or shortness of breath.  . Amino Acids-Protein Hydrolys (FEEDING SUPPLEMENT, PRO-STAT SUGAR FREE 64,) LIQD Take 30 mLs by mouth 2 (two) times daily between meals.  Roseanne Kaufman [place]-Castor Oil (VENELEX) OINT Apply 1 application topically 3 (three) times daily.  . cholestyramine (QUESTRAN) 4 g packet Take 1 packet (4 g total) by mouth daily as needed (ITCHING).  Marland Kitchen diltiazem (CARDIZEM CD) 300 MG 24 hr capsule Take 1 capsule (300 mg total) by mouth daily.  . diphenoxylate-atropine (LOMOTIL) 2.5-0.025 MG tablet TAKE 1 TABLET BY MOUTH 4 TIMES DAILY AS NEEDED FOR DIARRHEA OR  LOOSE  STOOLS  . docusate sodium (COLACE) 100 MG capsule Take 1 capsule (100 mg total) by mouth 2 (two) times daily.  . feeding supplement, ENSURE ENLIVE, (ENSURE ENLIVE) LIQD Take 237 mLs by mouth 2 (two) times daily between meals.  . insulin detemir (LEVEMIR) 100 UNIT/ML injection Inject 0.08 mLs (8 Units total) into the skin at bedtime.  Marland Kitchen LORazepam (ATIVAN) 0.5 MG tablet Take 1 tablet (0.5 mg total) by mouth 2 (two) times daily as needed for anxiety.  . Magnesium Oxide 400 MG CAPS Take 1 capsule (400 mg total) by mouth daily.  . megestrol (MEGACE) 400 MG/10ML suspension Take 10 mLs (400 mg total) by mouth 2 (two) times daily.  . metFORMIN (GLUCOPHAGE) 500 MG tablet Take 1,000 mg by mouth 2 (two) times daily with a meal.   . niacin 500 MG tablet Take 500 mg by mouth daily.  . NON FORMULARY Diet: NAS CHO  . Omega-3 1000 MG CAPS Take 1,000 mg by mouth 2 (two) times daily.  . ondansetron (ZOFRAN) 8 MG tablet Take 1 tablet (8 mg total) by mouth 2 (two) times daily as needed (Nausea or vomiting).  .  pantoprazole (PROTONIX) 40 MG tablet Take 1 tablet by mouth once daily  . pravastatin (PRAVACHOL) 20 MG tablet Take 20 mg by mouth daily before breakfast.   . prochlorperazine (COMPAZINE) 10 MG tablet Take 1 tablet (10 mg total) by  mouth every 6 (six) hours as needed (Nausea or vomiting).  . sodium chloride 0.9 % amt: 100 cc per hour; intravenous  Special Instructions: for acute renal failure Every Shift Day, Evening, Night  . traMADol (ULTRAM) 50 MG tablet Take 1 tablet (50 mg total) by mouth every 6 (six) hours as needed for moderate pain.  . [DISCONTINUED] furosemide (LASIX) 40 MG tablet Take 60 mg by mouth daily. Take 1.5 tabs to = 60 mg  . [DISCONTINUED] NYSTATIN powder Apply 1 Bottle topically daily as needed (irritation).   . [DISCONTINUED] potassium chloride SA (KLOR-CON) 20 MEQ tablet Take 1 tablet (20 mEq total) by mouth 2 (two) times daily.   No facility-administered encounter medications on file as of 07/22/2019.     SIGNIFICANT DIAGNOSTIC EXAMS   LABS REVIEWED PREVIOUS  07-13-19: wbc 10.6; hgb 8.1;hct 26.9; mcv 74.5 plt 122; glucose 192; bun 30; creat 0.97; k+ 2.8; na++ 136; ca 8.2; alk phos 181; total bili 1.5 albumin 2.3 mag 1.5 07-14-19: glucose 195; bun 29; creat 0.94; k+ 3.2; na++ 135; ca 8.2; ca 8.7  07-17-19: hgb 8.3; hct 27.6   TODAY  07-22-19: glucose 78; bun 63; creat 3.30 ;k+ 6.6; na++ 129; ca 9.0   Review of Systems  Constitutional: Positive for malaise/fatigue.  Respiratory: Negative for cough and shortness of breath.   Cardiovascular: Negative for chest pain, palpitations and leg swelling.  Gastrointestinal: Negative for abdominal pain, constipation and heartburn.  Genitourinary:       Has vaginal bleeding.   Musculoskeletal: Negative for back pain, joint pain and myalgias.  Skin: Negative.   Neurological: Negative for dizziness.  Psychiatric/Behavioral: The patient is not nervous/anxious.    Physical Exam Constitutional:      General: She is not in  acute distress.    Appearance: She is well-developed. She is obese. She is not diaphoretic.  Neck:     Thyroid: No thyromegaly.  Cardiovascular:     Rate and Rhythm: Normal rate. Rhythm irregular.     Pulses: Normal pulses.     Heart sounds: Normal heart sounds.  Pulmonary:     Effort: Pulmonary effort is normal. No respiratory distress.     Breath sounds: Normal breath sounds.  Abdominal:     General: Bowel sounds are normal. There is no distension.     Palpations: Abdomen is soft.     Tenderness: There is no abdominal tenderness.  Musculoskeletal:     Cervical back: Neck supple.     Right lower leg: Edema present.     Left lower leg: Edema present.     Comments: Bilateral lower extremity edema    Lymphadenopathy:     Cervical: No cervical adenopathy.  Skin:    General: Skin is warm and dry.     Comments: Bilateral lower extremities with redness present no warmth present.  Neurological:     Mental Status: She is alert and oriented to person, place, and time.  Psychiatric:        Mood and Affect: Mood normal.      ASSESSMENT/ PLAN:  TODAY;  1. Acute kidney injury: is worse: will stop lasix at this time. Will give kayexalate 60 gm now; will begin NS 100 cc per hour in the AM will check cbc; cmp.    MD is aware of resident's narcotic use and is in agreement with current plan of care. We will attempt to wean resident as appropriate.  Ok Edwards NP [facility] Adult Medicine  Contact [phone] Monday through  Friday 8am- 5pm  After hours call [phone]

## 2019-07-23 ENCOUNTER — Ambulatory Visit (HOSPITAL_COMMUNITY)
Admission: RE | Admit: 2019-07-23 | Payer: No Typology Code available for payment source | Source: Skilled Nursing Facility

## 2019-07-23 ENCOUNTER — Non-Acute Institutional Stay (SKILLED_NURSING_FACILITY): Payer: Medicare Other | Admitting: Adult Health

## 2019-07-23 ENCOUNTER — Other Ambulatory Visit (HOSPITAL_COMMUNITY)
Admission: RE | Admit: 2019-07-23 | Discharge: 2019-07-23 | Disposition: A | Discharge status: Home / Self Care | Payer: No Typology Code available for payment source | Source: Skilled Nursing Facility

## 2019-07-23 ENCOUNTER — Encounter: Payer: Self-pay | Admitting: Adult Health

## 2019-07-23 ENCOUNTER — Encounter (HOSPITAL_COMMUNITY): Payer: Self-pay

## 2019-07-23 ENCOUNTER — Ambulatory Visit (HOSPITAL_COMMUNITY)
Admission: RE | Admit: 2019-07-23 | Discharge: 2019-07-23 | Disposition: A | Discharge status: Home / Self Care | Payer: No Typology Code available for payment source | Source: Skilled Nursing Facility | Attending: Adult Health | Admitting: Adult Health

## 2019-07-23 DIAGNOSIS — I482 Chronic atrial fibrillation, unspecified: Secondary | ICD-10-CM | POA: Insufficient documentation

## 2019-07-23 DIAGNOSIS — N179 Acute kidney failure, unspecified: Secondary | ICD-10-CM

## 2019-07-23 DIAGNOSIS — L03115 Cellulitis of right lower limb: Secondary | ICD-10-CM

## 2019-07-23 DIAGNOSIS — I5032 Chronic diastolic (congestive) heart failure: Secondary | ICD-10-CM | POA: Diagnosis not present

## 2019-07-23 DIAGNOSIS — J90 Pleural effusion, not elsewhere classified: Secondary | ICD-10-CM | POA: Insufficient documentation

## 2019-07-23 LAB — CBC
HCT: 26.1 % — ABNORMAL LOW (ref 36.0–46.0)
Hemoglobin: 7.6 g/dL — ABNORMAL LOW (ref 12.0–15.0)
MCH: 23.2 pg — ABNORMAL LOW (ref 26.0–34.0)
MCHC: 29.1 g/dL — ABNORMAL LOW (ref 30.0–36.0)
MCV: 79.8 fL — ABNORMAL LOW (ref 80.0–100.0)
Platelets: 142 10*3/uL — ABNORMAL LOW (ref 150–400)
RBC: 3.27 MIL/uL — ABNORMAL LOW (ref 3.87–5.11)
RDW: 30 % — ABNORMAL HIGH (ref 11.5–15.5)
WBC: 18.9 10*3/uL — ABNORMAL HIGH (ref 4.0–10.5)
nRBC: 0.1 % (ref 0.0–0.2)

## 2019-07-23 LAB — COMPREHENSIVE METABOLIC PANEL
ALT: 22 U/L (ref 0–44)
AST: 40 U/L (ref 15–41)
Albumin: 2.4 g/dL — ABNORMAL LOW (ref 3.5–5.0)
Alkaline Phosphatase: 274 U/L — ABNORMAL HIGH (ref 38–126)
Anion gap: 20 — ABNORMAL HIGH (ref 5–15)
BUN: 66 mg/dL — ABNORMAL HIGH (ref 8–23)
CO2: 12 mmol/L — ABNORMAL LOW (ref 22–32)
Calcium: 9 mg/dL (ref 8.9–10.3)
Chloride: 100 mmol/L (ref 98–111)
Creatinine, Ser: 3.4 mg/dL — ABNORMAL HIGH (ref 0.44–1.00)
GFR calc Af Amer: 15 mL/min — ABNORMAL LOW (ref >60)
GFR calc non Af Amer: 13 mL/min — ABNORMAL LOW (ref >60)
Glucose, Bld: 82 mg/dL (ref 70–99)
Potassium: 6.3 mmol/L (ref 3.5–5.1)
Sodium: 132 mmol/L — ABNORMAL LOW (ref 135–145)
Total Bilirubin: 1.6 mg/dL — ABNORMAL HIGH (ref 0.3–1.2)
Total Protein: 6 g/dL — ABNORMAL LOW (ref 6.5–8.1)

## 2019-07-23 LAB — MAGNESIUM: Magnesium: 2.1 mg/dL (ref 1.7–2.4)

## 2019-07-23 NOTE — Progress Notes (Signed)
Location:    [facility] Room Number: 161W Place of Service:  SNF (31) Phillips Grout NP    CODE STATUS: FULL   Allergies  Allergen Reactions  . Levaquin [Levofloxacin] Other (See Comments)    Joint "seize" and constipation  . Codeine Other (See Comments)    Severe headache    Chief Complaint  Patient presents with  . Acute Visit    Care Plan Meeting     HPI:  We have come together for her care plan meeting. Unable to do BIMS mood 12/30. Family is present. She continues to have vaginal bleeding with a hgb of 7.6. she continues to have hyperkalemia; AKI. Her cellulitis in her legs is getting worse. Her family did have nursing concerns which were discussed. They have agreed to a palliative care consult. There are no reports of fevers present. Her wbc is elevated as well.   Past Medical History:  Diagnosis Date  . Anemia    in the past   . Anxiety   . Atrial fibrillation ([place])    a. diagnosed in 02/2018.  . Bronchitis    easily  develops bronchitis when she has a cold  . Cancer ([place])    L breast - CA, Feb. 4th   . Cellulitis   . Chronic kidney disease   . Diabetes mellitus without complication ([place])    type 2  . Dyspnea   . Dysrhythmia    afib, treating with Eliquis  . Family history of breast cancer   . Family history of uterine cancer   . Heart murmur   . Hyperlipidemia   . Hypertension     Past Surgical History:  Procedure Laterality Date  . BREAST LUMPECTOMY WITH RADIOACTIVE SEED AND SENTINEL LYMPH NODE BIOPSY Left 12/10/2018   Procedure: LEFT BREAST LUMPECTOMY WITH RADIOACTIVE SEED AND LEFT SENTINEL LYMPH NODE MAPPING;  Surgeon: Erroll Luna, MD;  Location: [facility];  Service: General;  Laterality: Left;  . DILATION AND CURETTAGE OF UTERUS     x2 on same day  . PORTACATH PLACEMENT N/A 08/01/2018   Procedure: INSERTION PORT-A-CATH WITH ULTRASOUND;  Surgeon: Erroll Luna, MD;  Location: [facility];  Service: General;  Laterality: N/A;  .  TONSILLECTOMY AND ADENOIDECTOMY      Social History   Socioeconomic History  . Marital status: Widowed    Spouse name: Not on file  . Number of children: Not on file  . Years of education: Not on file  . Highest education level: Not on file  Occupational History  . Not on file  Tobacco Use  . Smoking status: Never Smoker  . Smokeless tobacco: Never Used  Substance and Sexual Activity  . Alcohol use: Not Currently  . Drug use: Never  . Sexual activity: Not Currently  Other Topics Concern  . Not on file  Social History Narrative   Her husband passed away on 07-13-2018.   Religion: Christian.   Social Determinants of Health   Financial Resource Strain:   . Difficulty of Paying Living Expenses: Not on file  Food Insecurity:   . Worried About Charity fundraiser in the Last Year: Not on file  . Ran Out of Food in the Last Year: Not on file  Transportation Needs: No Transportation Needs  . Lack of Transportation (Medical): No  . Lack of Transportation (Non-Medical): No  Physical Activity:   . Days of Exercise per Week: Not on file  . Minutes of Exercise per  Session: Not on file  Stress:   . Feeling of Stress : Not on file  Social Connections:   . Frequency of Communication with Friends and Family: Not on file  . Frequency of Social Gatherings with Friends and Family: Not on file  . Attends Religious Services: Not on file  . Active Member of Clubs or Organizations: Not on file  . Attends Archivist Meetings: Not on file  . Marital Status: Not on file  Intimate Partner Violence: Not At Risk  . Fear of Current or Ex-Partner: No  . Emotionally Abused: No  . Physically Abused: No  . Sexually Abused: No   Family History  Problem Relation Age of Onset  . CAD Brother   . Hypertension Mother   . Hyperlipidemia Mother   . Hypertension Brother   . Uterine cancer Maternal Grandmother   . Breast cancer Cousin        dx in her 37s; mat first cousin  . Breast cancer  Cousin        dx in her 32s-60s; mat first cousin      VITAL SIGNS BP 109/70   Pulse 76   Temp (!) 97 F (36.1 C) (Oral)   Resp 20   Ht 5' 6"  (1.676 m)   Wt 291 lb (132 kg)   BMI 46.97 kg/m   Outpatient Encounter Medications as of 07/23/2019  Medication Sig  . acetaminophen (TYLENOL) 500 MG tablet Take 1,000 mg by mouth every 8 (eight) hours as needed for moderate pain or headache.   . albuterol (PROVENTIL HFA;VENTOLIN HFA) 108 (90 Base) MCG/ACT inhaler Inhale 2 puffs into the lungs every 6 (six) hours as needed for wheezing or shortness of breath.  . Amino Acids-Protein Hydrolys (FEEDING SUPPLEMENT, PRO-STAT SUGAR FREE 64,) LIQD Take 30 mLs by mouth 2 (two) times daily between meals.  Roseanne Kaufman [place]-Castor Oil (VENELEX) OINT Apply 1 application topically 3 (three) times daily.  . cholestyramine (QUESTRAN) 4 g packet Take 1 packet (4 g total) by mouth daily as needed (ITCHING).  Marland Kitchen diltiazem (CARDIZEM CD) 300 MG 24 hr capsule Take 1 capsule (300 mg total) by mouth daily.  . diphenoxylate-atropine (LOMOTIL) 2.5-0.025 MG tablet TAKE 1 TABLET BY MOUTH 4 TIMES DAILY AS NEEDED FOR DIARRHEA OR  LOOSE  STOOLS  . docusate sodium (COLACE) 100 MG capsule Take 100 mg by mouth 2 (two) times daily as needed for mild constipation. hold for loose stools  . feeding supplement, ENSURE ENLIVE, (ENSURE ENLIVE) LIQD Take 237 mLs by mouth 2 (two) times daily between meals.  . insulin detemir (LEVEMIR) 100 UNIT/ML injection Inject 0.08 mLs (8 Units total) into the skin at bedtime.  . Magnesium Oxide 400 MG CAPS Take 1 capsule (400 mg total) by mouth daily.  . megestrol (MEGACE) 400 MG/10ML suspension Take 10 mLs (400 mg total) by mouth 2 (two) times daily.  . metFORMIN (GLUCOPHAGE) 1000 MG tablet Take 1,000 mg by mouth in the morning and at bedtime.  . niacin 500 MG tablet Take 500 mg by mouth daily.  . NON FORMULARY Diet: NAS CHO  . nystatin (MYCOSTATIN/NYSTOP) powder Apply 1 application topically daily as  needed. Apply topically to abdominal and perineal skin folds daily prn fungal rash.  . Omega-3 1000 MG CAPS Take 1,000 mg by mouth 2 (two) times daily.  . ondansetron (ZOFRAN) 8 MG tablet Take 1 tablet (8 mg total) by mouth 2 (two) times daily as needed (Nausea or vomiting).  . pantoprazole (  PROTONIX) 40 MG tablet Take 1 tablet by mouth once daily  . pravastatin (PRAVACHOL) 20 MG tablet Take 20 mg by mouth daily before breakfast.   . prochlorperazine (COMPAZINE) 10 MG tablet Take 1 tablet (10 mg total) by mouth every 6 (six) hours as needed (Nausea or vomiting).  . [DISCONTINUED] docusate sodium (COLACE) 100 MG capsule Take 1 capsule (100 mg total) by mouth 2 (two) times daily.  . [DISCONTINUED] LORazepam (ATIVAN) 0.5 MG tablet Take 1 tablet (0.5 mg total) by mouth 2 (two) times daily as needed for anxiety.  . [DISCONTINUED] metFORMIN (GLUCOPHAGE) 500 MG tablet Take 1,000 mg by mouth 2 (two) times daily with a meal.   . [DISCONTINUED] sodium chloride 0.9 % amt: 100 cc per hour; intravenous  Special Instructions: for acute renal failure Every Shift Day, Evening, Night  . [DISCONTINUED] traMADol (ULTRAM) 50 MG tablet Take 1 tablet (50 mg total) by mouth every 6 (six) hours as needed for moderate pain.   No facility-administered encounter medications on file as of 07/23/2019.     SIGNIFICANT DIAGNOSTIC EXAMS   LABS REVIEWED PREVIOUS  07-13-19: wbc 10.6; hgb 8.1;hct 26.9; mcv 74.5 plt 122; glucose 192; bun 30; creat 0.97; k+ 2.8; na++ 136; ca 8.2; alk phos 181; total bili 1.5 albumin 2.3 mag 1.5 07-14-19: glucose 195; bun 29; creat 0.94; k+ 3.2; na++ 135; ca 8.2; ca 8.7  07-17-19: hgb 8.3; hct 27.6  07-22-19: glucose 78; bun 63; creat 3.30 ;k+ 6.6; na++ 129; ca 9.0  TODAY  07-23-19: wbc 18.9; hgb 7.6; hct 26.1; mcv 79.8 plt 142; glucose 82; bun 66; creat 3.40 ;k+ 6.3; na++ 132; ca 9.0; albumin 2.4 mag 2.1    Review of Systems  Constitutional: Positive for malaise/fatigue.  Respiratory:  Negative for cough and shortness of breath.   Cardiovascular: Negative for chest pain, palpitations and leg swelling.  Gastrointestinal: Negative for abdominal pain, constipation and heartburn.  Genitourinary:       Vaginal bleeding   Musculoskeletal: Negative for back pain, joint pain and myalgias.  Skin: Negative.   Neurological: Negative for dizziness.  Psychiatric/Behavioral: The patient is not nervous/anxious.     Physical Exam Constitutional:      General: She is not in acute distress.    Appearance: She is well-developed. She is obese. She is not diaphoretic.  Neck:     Thyroid: No thyromegaly.  Cardiovascular:     Rate and Rhythm: Normal rate. Rhythm irregular.     Pulses: Normal pulses.     Heart sounds: Normal heart sounds.  Pulmonary:     Effort: Pulmonary effort is normal. No respiratory distress.     Breath sounds: Normal breath sounds.  Abdominal:     General: Bowel sounds are normal. There is no distension.     Palpations: Abdomen is soft.     Tenderness: There is no abdominal tenderness.  Musculoskeletal:     Cervical back: Neck supple.     Right lower leg: Edema present.     Left lower leg: Edema present.     Comments: Bilateral lower extremity edema    Lymphadenopathy:     Cervical: No cervical adenopathy.  Skin:    General: Skin is warm and dry.     Comments: Bilateral lower extremities with redness present HAS warmth present.  Neurological:     Mental Status: She is alert and oriented to person, place, and time.  Psychiatric:        Mood and Affect: Mood normal.  ASSESSMENT/ PLAN:  TODAY  1. AKI 2. Cellulitis bilateral lower extremities  Will stop megace due to increase risk of clotting Will give kayexalate 60 gm now Will continue IVF NS 100 cc per hour Will begin cefepime 500 mg now then 250 mg daily through 08-14-19 Will continue to monitor her status Will check cbc bmp in the AM.   MD is aware of resident's narcotic use and is in  agreement with current plan of care. We will attempt to wean resident as appropriate.  Ok Edwards NP [facility]  Contact [phone] Monday through Friday 8am- 5pm  After hours call [phone]

## 2019-07-24 ENCOUNTER — Inpatient Hospital Stay (HOSPITAL_COMMUNITY)
Admission: EM | Admit: 2019-07-24 | Discharge: 2019-08-04 | DRG: 871 | Disposition: E | Payer: Medicare Other | Source: Skilled Nursing Facility | Attending: Internal Medicine | Admitting: Internal Medicine

## 2019-07-24 ENCOUNTER — Other Ambulatory Visit (HOSPITAL_COMMUNITY)
Admission: RE | Admit: 2019-07-24 | Discharge: 2019-07-24 | Disposition: A | Discharge status: Home / Self Care | Payer: Medicare Other | Source: Skilled Nursing Facility | Attending: Adult Health | Admitting: Adult Health

## 2019-07-24 ENCOUNTER — Encounter (HOSPITAL_COMMUNITY): Payer: Self-pay

## 2019-07-24 ENCOUNTER — Emergency Department (HOSPITAL_COMMUNITY): Payer: Medicare Other

## 2019-07-24 ENCOUNTER — Other Ambulatory Visit: Payer: Self-pay

## 2019-07-24 DIAGNOSIS — Z20822 Contact with and (suspected) exposure to covid-19: Secondary | ICD-10-CM | POA: Diagnosis present

## 2019-07-24 DIAGNOSIS — K7581 Nonalcoholic steatohepatitis (NASH): Secondary | ICD-10-CM | POA: Diagnosis present

## 2019-07-24 DIAGNOSIS — R404 Transient alteration of awareness: Secondary | ICD-10-CM | POA: Diagnosis not present

## 2019-07-24 DIAGNOSIS — A419 Sepsis, unspecified organism: Principal | ICD-10-CM | POA: Diagnosis present

## 2019-07-24 DIAGNOSIS — E1122 Type 2 diabetes mellitus with diabetic chronic kidney disease: Secondary | ICD-10-CM | POA: Diagnosis present

## 2019-07-24 DIAGNOSIS — E785 Hyperlipidemia, unspecified: Secondary | ICD-10-CM | POA: Diagnosis present

## 2019-07-24 DIAGNOSIS — Z853 Personal history of malignant neoplasm of breast: Secondary | ICD-10-CM

## 2019-07-24 DIAGNOSIS — N189 Chronic kidney disease, unspecified: Secondary | ICD-10-CM | POA: Diagnosis present

## 2019-07-24 DIAGNOSIS — E872 Acidosis: Secondary | ICD-10-CM | POA: Diagnosis present

## 2019-07-24 DIAGNOSIS — Z8049 Family history of malignant neoplasm of other genital organs: Secondary | ICD-10-CM

## 2019-07-24 DIAGNOSIS — Z7901 Long term (current) use of anticoagulants: Secondary | ICD-10-CM

## 2019-07-24 DIAGNOSIS — N179 Acute kidney failure, unspecified: Secondary | ICD-10-CM | POA: Diagnosis present

## 2019-07-24 DIAGNOSIS — Z794 Long term (current) use of insulin: Secondary | ICD-10-CM | POA: Diagnosis not present

## 2019-07-24 DIAGNOSIS — R531 Weakness: Secondary | ICD-10-CM | POA: Diagnosis not present

## 2019-07-24 DIAGNOSIS — R0989 Other specified symptoms and signs involving the circulatory and respiratory systems: Secondary | ICD-10-CM | POA: Diagnosis not present

## 2019-07-24 DIAGNOSIS — R0902 Hypoxemia: Secondary | ICD-10-CM | POA: Diagnosis not present

## 2019-07-24 DIAGNOSIS — I4891 Unspecified atrial fibrillation: Secondary | ICD-10-CM | POA: Diagnosis present

## 2019-07-24 DIAGNOSIS — Z803 Family history of malignant neoplasm of breast: Secondary | ICD-10-CM | POA: Diagnosis not present

## 2019-07-24 DIAGNOSIS — Z8249 Family history of ischemic heart disease and other diseases of the circulatory system: Secondary | ICD-10-CM

## 2019-07-24 DIAGNOSIS — Z8349 Family history of other endocrine, nutritional and metabolic diseases: Secondary | ICD-10-CM

## 2019-07-24 DIAGNOSIS — I5032 Chronic diastolic (congestive) heart failure: Secondary | ICD-10-CM | POA: Diagnosis present

## 2019-07-24 DIAGNOSIS — Z881 Allergy status to other antibiotic agents status: Secondary | ICD-10-CM

## 2019-07-24 DIAGNOSIS — Z79899 Other long term (current) drug therapy: Secondary | ICD-10-CM | POA: Diagnosis not present

## 2019-07-24 DIAGNOSIS — N950 Postmenopausal bleeding: Secondary | ICD-10-CM | POA: Insufficient documentation

## 2019-07-24 DIAGNOSIS — Z885 Allergy status to narcotic agent status: Secondary | ICD-10-CM

## 2019-07-24 DIAGNOSIS — R6521 Severe sepsis with septic shock: Secondary | ICD-10-CM | POA: Diagnosis present

## 2019-07-24 DIAGNOSIS — Z66 Do not resuscitate: Secondary | ICD-10-CM | POA: Diagnosis present

## 2019-07-24 DIAGNOSIS — Z6841 Body Mass Index (BMI) 40.0 and over, adult: Secondary | ICD-10-CM | POA: Diagnosis not present

## 2019-07-24 DIAGNOSIS — R069 Unspecified abnormalities of breathing: Secondary | ICD-10-CM | POA: Diagnosis not present

## 2019-07-24 DIAGNOSIS — Z9221 Personal history of antineoplastic chemotherapy: Secondary | ICD-10-CM | POA: Diagnosis not present

## 2019-07-24 DIAGNOSIS — R58 Hemorrhage, not elsewhere classified: Secondary | ICD-10-CM | POA: Diagnosis not present

## 2019-07-24 DIAGNOSIS — I469 Cardiac arrest, cause unspecified: Secondary | ICD-10-CM | POA: Diagnosis not present

## 2019-07-24 DIAGNOSIS — I130 Hypertensive heart and chronic kidney disease with heart failure and stage 1 through stage 4 chronic kidney disease, or unspecified chronic kidney disease: Secondary | ICD-10-CM | POA: Diagnosis present

## 2019-07-24 DIAGNOSIS — I10 Essential (primary) hypertension: Secondary | ICD-10-CM | POA: Diagnosis not present

## 2019-07-24 DIAGNOSIS — E1165 Type 2 diabetes mellitus with hyperglycemia: Secondary | ICD-10-CM | POA: Diagnosis present

## 2019-07-24 DIAGNOSIS — R4182 Altered mental status, unspecified: Secondary | ICD-10-CM | POA: Diagnosis present

## 2019-07-24 DIAGNOSIS — E875 Hyperkalemia: Secondary | ICD-10-CM | POA: Diagnosis not present

## 2019-07-24 LAB — CBC
HCT: 26.8 % — ABNORMAL LOW (ref 36.0–46.0)
Hemoglobin: 7.3 g/dL — ABNORMAL LOW (ref 12.0–15.0)
MCH: 23 pg — ABNORMAL LOW (ref 26.0–34.0)
MCHC: 27.2 g/dL — ABNORMAL LOW (ref 30.0–36.0)
MCV: 84.5 fL (ref 80.0–100.0)
Platelets: 120 10*3/uL — ABNORMAL LOW (ref 150–400)
RBC: 3.17 MIL/uL — ABNORMAL LOW (ref 3.87–5.11)
RDW: 30.2 % — ABNORMAL HIGH (ref 11.5–15.5)
WBC: 22.3 10*3/uL — ABNORMAL HIGH (ref 4.0–10.5)
nRBC: 0.2 % (ref 0.0–0.2)

## 2019-07-24 LAB — CBC WITH DIFFERENTIAL/PLATELET
Abs Immature Granulocytes: 0.19 10*3/uL — ABNORMAL HIGH (ref 0.00–0.07)
Basophils Absolute: 0 10*3/uL (ref 0.0–0.1)
Basophils Relative: 0 %
Eosinophils Absolute: 0 10*3/uL (ref 0.0–0.5)
Eosinophils Relative: 0 %
HCT: 27.1 % — ABNORMAL LOW (ref 36.0–46.0)
Hemoglobin: 7.3 g/dL — ABNORMAL LOW (ref 12.0–15.0)
Immature Granulocytes: 1 %
Lymphocytes Relative: 3 %
Lymphs Abs: 0.6 10*3/uL — ABNORMAL LOW (ref 0.7–4.0)
MCH: 23.2 pg — ABNORMAL LOW (ref 26.0–34.0)
MCHC: 26.9 g/dL — ABNORMAL LOW (ref 30.0–36.0)
MCV: 86 fL (ref 80.0–100.0)
Monocytes Absolute: 0.7 10*3/uL (ref 0.1–1.0)
Monocytes Relative: 3 %
Neutro Abs: 21.9 10*3/uL — ABNORMAL HIGH (ref 1.7–7.7)
Neutrophils Relative %: 93 %
Platelets: 129 10*3/uL — ABNORMAL LOW (ref 150–400)
RBC: 3.15 MIL/uL — ABNORMAL LOW (ref 3.87–5.11)
RDW: 30.1 % — ABNORMAL HIGH (ref 11.5–15.5)
WBC: 23.4 10*3/uL — ABNORMAL HIGH (ref 4.0–10.5)
nRBC: 0.2 % (ref 0.0–0.2)

## 2019-07-24 LAB — BLOOD GAS, ARTERIAL
Acid-base deficit: 24.2 mmol/L — ABNORMAL HIGH (ref 0.0–2.0)
Acid-base deficit: 23.4 mmol/L — ABNORMAL HIGH (ref 0.0–2.0)
Bicarbonate: 6.5 mmol/L — ABNORMAL LOW (ref 20.0–28.0)
Bicarbonate: 7 mmol/L — ABNORMAL LOW (ref 20.0–28.0)
FIO2: 36
FIO2: 28
O2 Saturation: 97.6 %
O2 Saturation: 96.7 %
Patient temperature: 31.1
Patient temperature: 33.1
pCO2 arterial: 24.1 mmHg — ABNORMAL LOW (ref 32.0–48.0)
pCO2 arterial: 33.3 mmHg (ref 32.0–48.0)
pH, Arterial: 6.962 — CL (ref 7.350–7.450)
pH, Arterial: 6.919 — CL (ref 7.350–7.450)
pO2, Arterial: 151 mmHg — ABNORMAL HIGH (ref 83.0–108.0)
pO2, Arterial: 133 mmHg — ABNORMAL HIGH (ref 83.0–108.0)

## 2019-07-24 LAB — BASIC METABOLIC PANEL
BUN: 69 mg/dL — ABNORMAL HIGH (ref 8–23)
CO2: <7 mmol/L — ABNORMAL LOW (ref 22–32)
Calcium: 8.6 mg/dL — ABNORMAL LOW (ref 8.9–10.3)
Chloride: 100 mmol/L (ref 98–111)
Creatinine, Ser: 3.55 mg/dL — ABNORMAL HIGH (ref 0.44–1.00)
GFR calc Af Amer: 14 mL/min — ABNORMAL LOW (ref >60)
GFR calc non Af Amer: 12 mL/min — ABNORMAL LOW (ref >60)
Glucose, Bld: 112 mg/dL — ABNORMAL HIGH (ref 70–99)
Potassium: 5.7 mmol/L — ABNORMAL HIGH (ref 3.5–5.1)
Sodium: 132 mmol/L — ABNORMAL LOW (ref 135–145)

## 2019-07-24 LAB — URINE CULTURE

## 2019-07-24 LAB — COMPREHENSIVE METABOLIC PANEL
ALT: 24 U/L (ref 0–44)
AST: 44 U/L — ABNORMAL HIGH (ref 15–41)
Albumin: 2.3 g/dL — ABNORMAL LOW (ref 3.5–5.0)
Alkaline Phosphatase: 257 U/L — ABNORMAL HIGH (ref 38–126)
BUN: 68 mg/dL — ABNORMAL HIGH (ref 8–23)
CO2: <7 mmol/L — ABNORMAL LOW (ref 22–32)
Calcium: 8.7 mg/dL — ABNORMAL LOW (ref 8.9–10.3)
Chloride: 99 mmol/L (ref 98–111)
Creatinine, Ser: 3.64 mg/dL — ABNORMAL HIGH (ref 0.44–1.00)
GFR calc Af Amer: 14 mL/min — ABNORMAL LOW (ref >60)
GFR calc non Af Amer: 12 mL/min — ABNORMAL LOW (ref >60)
Glucose, Bld: 123 mg/dL — ABNORMAL HIGH (ref 70–99)
Potassium: 5.7 mmol/L — ABNORMAL HIGH (ref 3.5–5.1)
Sodium: 130 mmol/L — ABNORMAL LOW (ref 135–145)
Total Bilirubin: 1.5 mg/dL — ABNORMAL HIGH (ref 0.3–1.2)
Total Protein: 5.7 g/dL — ABNORMAL LOW (ref 6.5–8.1)

## 2019-07-24 LAB — PROTIME-INR
INR: 2.5 — ABNORMAL HIGH (ref 0.8–1.2)
Prothrombin Time: 27.1 seconds — ABNORMAL HIGH (ref 11.4–15.2)

## 2019-07-24 LAB — POC SARS CORONAVIRUS 2 AG -  ED: SARS Coronavirus 2 Ag: NEGATIVE

## 2019-07-24 LAB — LACTIC ACID, PLASMA
Lactic Acid, Venous: >11 mmol/L (ref 0.5–1.9)
Lactic Acid, Venous: >11 mmol/L (ref 0.5–1.9)

## 2019-07-24 LAB — APTT: aPTT: 46 seconds — ABNORMAL HIGH (ref 24–36)

## 2019-07-24 MED ORDER — MORPHINE SULFATE (PF) 2 MG/ML IV SOLN
2.0000 mg | INTRAVENOUS | Status: DC | PRN
  Administered 2019-07-24: 20:00:00 2 mg via INTRAVENOUS
  Filled 2019-07-24: qty 1

## 2019-07-24 MED ORDER — HALOPERIDOL LACTATE 2 MG/ML PO CONC: 0.5000 mg | ORAL | Status: DC | PRN

## 2019-07-24 MED ORDER — SODIUM CHLORIDE 0.9 % IV BOLUS
1000.0000 mL | Freq: Once | INTRAVENOUS | Status: AC
  Administered 2019-07-24: 1000 mL via INTRAVENOUS

## 2019-07-24 MED ORDER — HALOPERIDOL LACTATE 5 MG/ML IJ SOLN: 0.5000 mg | INTRAMUSCULAR | Status: DC | PRN

## 2019-07-24 MED ORDER — POLYVINYL ALCOHOL 1.4 % OP SOLN: 1.0000 [drp] | Freq: Four times a day (QID) | OPHTHALMIC | Status: DC | PRN

## 2019-07-24 MED ORDER — SODIUM BICARBONATE 8.4 % IV SOLN
50.0000 meq | Freq: Once | INTRAVENOUS | Status: AC
  Administered 2019-07-24: 50 meq via INTRAVENOUS

## 2019-07-24 MED ORDER — VASOPRESSIN 20 UNIT/ML IV SOLN
0.0300 [IU]/min | INTRAVENOUS | Status: DC
  Administered 2019-07-24: 0.03 [IU]/min via INTRAVENOUS
  Filled 2019-07-24: qty 2

## 2019-07-24 MED ORDER — SODIUM CHLORIDE 0.9 % IV SOLN
2.0000 g | Freq: Once | INTRAVENOUS | Status: AC
  Administered 2019-07-24: 2 g via INTRAVENOUS
  Filled 2019-07-24: qty 2

## 2019-07-24 MED ORDER — ACETAMINOPHEN 650 MG RE SUPP: 650.0000 mg | Freq: Four times a day (QID) | RECTAL | Status: DC | PRN

## 2019-07-24 MED ORDER — GLYCOPYRROLATE 0.2 MG/ML IJ SOLN: 0.2000 mg | INTRAMUSCULAR | Status: DC | PRN

## 2019-07-24 MED ORDER — ACETAMINOPHEN 325 MG PO TABS: 650.0000 mg | ORAL_TABLET | Freq: Four times a day (QID) | ORAL | Status: DC | PRN

## 2019-07-24 MED ORDER — SODIUM CHLORIDE 0.9 % IV SOLN: 2.0000 g | INTRAVENOUS | Status: DC

## 2019-07-24 MED ORDER — VANCOMYCIN HCL 2000 MG/400ML IV SOLN
2000.0000 mg | Freq: Once | INTRAVENOUS | Status: AC
  Administered 2019-07-24: 2000 mg via INTRAVENOUS
  Filled 2019-07-24: qty 400

## 2019-07-24 MED ORDER — GLYCOPYRROLATE 1 MG PO TABS: 1.0000 mg | ORAL_TABLET | ORAL | Status: DC | PRN

## 2019-07-24 MED ORDER — NOREPINEPHRINE 4 MG/250ML-% IV SOLN
0.0000 ug/min | INTRAVENOUS | Status: DC
  Administered 2019-07-24: 5.333 ug/min via INTRAVENOUS
  Administered 2019-07-24: 5 ug/min via INTRAVENOUS
  Administered 2019-07-24: 10 ug/min via INTRAVENOUS
  Filled 2019-07-24 (×2): qty 250

## 2019-07-24 MED ORDER — BIOTENE DRY MOUTH MT LIQD: 15.0000 mL | OROMUCOSAL | Status: DC | PRN

## 2019-07-24 MED ORDER — SODIUM BICARBONATE 8.4 % IV SOLN
INTRAVENOUS | Status: AC
  Filled 2019-07-24: qty 50

## 2019-07-24 MED ORDER — METRONIDAZOLE IN NACL 5-0.79 MG/ML-% IV SOLN
500.0000 mg | Freq: Once | INTRAVENOUS | Status: AC
  Administered 2019-07-24: 500 mg via INTRAVENOUS
  Filled 2019-07-24: qty 100

## 2019-07-24 MED ORDER — VITAMIN K1 10 MG/ML IJ SOLN
10.0000 mg | Freq: Once | INTRAVENOUS | Status: AC
  Administered 2019-07-24: 10 mg via INTRAVENOUS
  Filled 2019-07-24: qty 1

## 2019-07-24 MED ORDER — SODIUM CHLORIDE 0.9 % IV BOLUS (SEPSIS)
800.0000 mL | Freq: Once | INTRAVENOUS | Status: AC
  Administered 2019-07-24: 800 mL via INTRAVENOUS

## 2019-07-24 MED ORDER — LORAZEPAM 2 MG/ML IJ SOLN
1.0000 mg | INTRAMUSCULAR | Status: DC | PRN
  Administered 2019-07-24: 1 mg via INTRAVENOUS
  Filled 2019-07-24: qty 1

## 2019-07-24 MED ORDER — ONDANSETRON HCL 4 MG/2ML IJ SOLN: 4.0000 mg | Freq: Four times a day (QID) | INTRAMUSCULAR | Status: DC | PRN

## 2019-07-24 MED ORDER — SODIUM BICARBONATE-DEXTROSE 150-5 MEQ/L-% IV SOLN
150.0000 meq | INTRAVENOUS | Status: DC
  Filled 2019-07-24 (×6): qty 1000

## 2019-07-24 MED ORDER — SODIUM CHLORIDE 0.9 % IV BOLUS (SEPSIS)
1000.0000 mL | Freq: Once | INTRAVENOUS | Status: AC
  Administered 2019-07-24: 1000 mL via INTRAVENOUS

## 2019-07-24 MED ORDER — VANCOMYCIN HCL IN DEXTROSE 1-5 GM/200ML-% IV SOLN: 1000.0000 mg | Freq: Once | INTRAVENOUS | Status: DC

## 2019-07-24 MED ORDER — VANCOMYCIN HCL 1500 MG/300ML IV SOLN: 1500.0000 mg | INTRAVENOUS | Status: DC

## 2019-07-24 MED ORDER — ONDANSETRON 4 MG PO TBDP: 4.0000 mg | ORAL_TABLET | Freq: Four times a day (QID) | ORAL | Status: DC | PRN

## 2019-07-24 MED ORDER — SODIUM BICARBONATE-DEXTROSE 150-5 MEQ/L-% IV SOLN
150.0000 meq | INTRAVENOUS | Status: DC
  Administered 2019-07-24: 150 meq via INTRAVENOUS
  Filled 2019-07-24 (×3): qty 1000

## 2019-07-24 MED ORDER — HYDROCORTISONE NA SUCCINATE PF 100 MG IJ SOLR
100.0000 mg | Freq: Once | INTRAMUSCULAR | Status: DC
  Filled 2019-07-24: qty 2

## 2019-07-24 MED ORDER — HALOPERIDOL 0.5 MG PO TABS: 0.5000 mg | ORAL_TABLET | ORAL | Status: DC | PRN

## 2019-07-25 ENCOUNTER — Inpatient Hospital Stay: Payer: Medicare Other

## 2019-07-25 ENCOUNTER — Inpatient Hospital Stay: Payer: Medicare Other | Admitting: Hematology and Oncology

## 2019-07-29 LAB — CULTURE, BLOOD (ROUTINE X 2)
Culture: NO GROWTH
Culture: NO GROWTH
Special Requests: ADEQUATE

## 2019-08-01 ENCOUNTER — Ambulatory Visit: Payer: Medicare Other | Admitting: Gastroenterology

## 2019-08-04 NOTE — Progress Notes (Signed)
[facility] night shift hospitalist note.  The admission process has been canceled. The patient was going to be admitted for comfort care to our service, but unfortunately passed away in the emergency department a few minutes ago at 2132 before I was able to evaluate her.   Tennis Must, MD

## 2019-08-04 NOTE — ED Notes (Signed)
Dressing to left posterior back removed, not well attached healing scarring noted to back and buttock with redness and Stg 2 (Skin opening) noted.

## 2019-08-04 NOTE — ED Provider Notes (Signed)
CRITICAL CARE Performed by: Lucrezia Starch   Total critical care time: 55 minutes  Critical care time was exclusive of separately billable procedures and treating other patients.  Critical care was necessary to treat or prevent imminent or life-threatening deterioration.  Critical care was time spent personally by me on the following activities: development of treatment plan with patient and/or surrogate as well as nursing, discussions with consultants, evaluation of patient's response to treatment, examination of patient, obtaining history from patient or surrogate, ordering and performing treatments and interventions, ordering and review of laboratory studies, ordering and review of radiographic studies, pulse oximetry and re-evaluation of patient's condition.    This critical care was provided after sign out and upon further worsening condition in addition to the critical care documented by Dr. Tomi Bamberger.   Lucrezia Starch, MD 08-16-19 [phone]

## 2019-08-04 NOTE — ED Notes (Signed)
Date and time results received: 08/03/2019 @ 1349  Test: Lactic Acid Critical Value: >11  Name of Provider Notified:Dr Hillard Danker  Orders Received? Or Actions Taken?:  and time results received: 03-Aug-2019 @1348 

## 2019-08-04 NOTE — ED Provider Notes (Addendum)
[facility] EMERGENCY DEPARTMENT Provider Note   CSN: [id number] Arrival date & time: 08/04/19  1002     History Chief Complaint  Patient presents with  . Hypotension    Destiny Nash is a 74 y.o. female.  HPI   Patient presents to the emergency room for evaluation of altered mental status.  Patient has a complicated medical history.  Patient has history of diabetes, hypertension, atrial fibrillation and breast cancer treated with chemotherapy.  Patient was recently in the hospital earlier this month from February 2 to February 8.  Patient was admitted for generalized weakness.  She was treated for sepsis secondary to cellulitis.  Patient was noted to have multiple other comorbidities and she was discharged to a nursing facility.  Patient has had a gradual decline in her health since her chemotherapy treatments.  Patient was noted to have vaginal bleeding recently according to the medical records.  Pt was too weak to leave the nursing facility so consultation was performed there.   According to the nursing home notes the patient has been declining significantly over the last several days.  Patient was normally able to communicate clearly.  Today the patient is only vocalizing pain but is not able to otherwise communicate.  According to the nursing home notes the patient had significant laboratory abnormalities and was noted to be hypotensive.  Past Medical History:  Diagnosis Date  . Anemia    in the past   . Anxiety   . Atrial fibrillation ([place])    a. diagnosed in 02/2018.  . Bronchitis    easily  develops bronchitis when she has a cold  . Cancer ([place])    L breast - CA, Feb. 4th   . Cellulitis   . Chronic kidney disease   . Diabetes mellitus without complication ([place])    type 2  . Dyspnea   . Dysrhythmia    afib, treating with Eliquis  . Family history of breast cancer   . Family history of uterine cancer   . Heart murmur   . Hyperlipidemia   . Hypertension      Patient Active Problem List   Diagnosis Date Noted  . Sepsis ([place]) 07/19/2019  . Acute on chronic diastolic (congestive) heart failure ([place]) 07/19/2019  . Hypokalemia 07/19/2019  . Abnormal CT scan, gallbladder   . AKI (acute kidney injury) ([place])   . Dyspnea   . Elevated liver enzymes   . Hyperkalemia   . Palliative care by specialist   . Pressure injury of skin 07/09/2019  . Liver cirrhosis secondary to NASH (nonalcoholic steatohepatitis) ([place]) 07/09/2019  . Ascites 07/09/2019  . Cholelithiasis with gallbladder wall thickening 07/09/2019  . Chronic anticoagulation 07/09/2019  . Generalized weakness 07/08/2019  . Breast cancer ([place]) 07/08/2019  . Diastolic congestive heart failure ([place]) 07/08/2019  . Hypoglycemia 10/20/2018  . Atrial fibrillation ([place]) 10/20/2018  . Type 2 diabetes mellitus ([place]) 10/20/2018  . Genetic testing 10/14/2018  . Family history of breast cancer   . Family history of uterine cancer   . Port-A-Cath in place 08/09/2018  . Malignant neoplasm of upper-outer quadrant of left breast in female, estrogen receptor positive ([place]) 07/25/2018  . Unspecified atrial fibrillation ([place]) 02/14/2018  . Cellulitis, leg 02/13/2018  . Uncontrolled type 2 diabetes mellitus with hyperglycemia, with long-term current use of insulin ([place]) 02/13/2018  . Essential hypertension 02/13/2018  . Hyperlipidemia 02/13/2018  . Obesity, Class III, BMI 40-49.9 (morbid obesity) ([place]) 02/13/2018    Past Surgical History:  Procedure Laterality Date  . BREAST LUMPECTOMY WITH RADIOACTIVE SEED AND SENTINEL LYMPH NODE BIOPSY Left 12/10/2018   Procedure: LEFT BREAST LUMPECTOMY WITH RADIOACTIVE SEED AND LEFT SENTINEL LYMPH NODE MAPPING;  Surgeon: Erroll Luna, MD;  Location: [place];  Service: General;  Laterality: Left;  . DILATION AND CURETTAGE OF UTERUS     x2 on same day  . PORTACATH PLACEMENT N/A 08/01/2018   Procedure: INSERTION PORT-A-CATH WITH ULTRASOUND;  Surgeon: Erroll Luna, MD;   Location: [place];  Service: General;  Laterality: N/A;  . TONSILLECTOMY AND ADENOIDECTOMY       OB History   No obstetric history on file.     Family History  Problem Relation Age of Onset  . CAD Brother   . Hypertension Mother   . Hyperlipidemia Mother   . Hypertension Brother   . Uterine cancer Maternal Grandmother   . Breast cancer Cousin        dx in her 10s; mat first cousin  . Breast cancer Cousin        dx in her 26s-60s; mat first cousin    Social History   Tobacco Use  . Smoking status: Never Smoker  . Smokeless tobacco: Never Used  Substance Use Topics  . Alcohol use: Not Currently  . Drug use: Never    Home Medications Prior to Admission medications   Medication Sig Start Date End Date Taking? Authorizing Provider  acetaminophen (TYLENOL) 500 MG tablet Take 1,000 mg by mouth every 8 (eight) hours as needed for moderate pain or headache.     [provider]  albuterol (PROVENTIL HFA;VENTOLIN HFA) 108 (90 Base) MCG/ACT inhaler Inhale 2 puffs into the lungs every 6 (six) hours as needed for wheezing or shortness of breath.    [provider]  Amino Acids-Protein Hydrolys (FEEDING SUPPLEMENT, PRO-STAT SUGAR FREE 64,) LIQD Take 30 mLs by mouth 2 (two) times daily between meals.    [provider]  Janne Lab Oil [facility]) OINT Apply 1 application topically 3 (three) times daily.    [provider]  cholestyramine (QUESTRAN) 4 g packet Take 1 packet (4 g total) by mouth daily as needed (ITCHING). 07/14/19   Johnson, Clanford L, MD  diltiazem (CARDIZEM CD) 300 MG 24 hr capsule Take 1 capsule (300 mg total) by mouth daily. 07/01/18   Arnoldo Lenis, MD  diphenoxylate-atropine (LOMOTIL) 2.5-0.025 MG tablet TAKE 1 TABLET BY MOUTH 4 TIMES DAILY AS NEEDED FOR DIARRHEA OR  LOOSE  STOOLS 07/07/19   Nicholas Lose, MD  docusate sodium (COLACE) 100 MG capsule Take 100 mg by mouth 2 (two) times daily as needed for mild constipation. hold  for loose stools    [provider]  feeding supplement, ENSURE ENLIVE, (ENSURE ENLIVE) LIQD Take 237 mLs by mouth 2 (two) times daily between meals.    [provider]  insulin detemir (LEVEMIR) 100 UNIT/ML injection Inject 0.08 mLs (8 Units total) into the skin at bedtime. 07/14/19   Johnson, Clanford L, MD  Magnesium Oxide 400 MG CAPS Take 1 capsule (400 mg total) by mouth daily. 04/24/19   Arnoldo Lenis, MD  megestrol (MEGACE) 400 MG/10ML suspension Take 10 mLs (400 mg total) by mouth 2 (two) times daily. 07/14/19   Johnson, Clanford L, MD  metFORMIN (GLUCOPHAGE) 1000 MG tablet Take 1,000 mg by mouth in the morning and at bedtime.    [provider]  niacin 500 MG tablet Take 500 mg by mouth daily.  [provider]  NON FORMULARY Diet: NAS CHO    [provider]  nystatin (MYCOSTATIN/NYSTOP) powder Apply 1 application topically daily as needed. Apply topically to abdominal and perineal skin folds daily prn fungal rash.    [provider]  Omega-3 1000 MG CAPS Take 1,000 mg by mouth 2 (two) times daily.    [provider]  ondansetron (ZOFRAN) 8 MG tablet Take 1 tablet (8 mg total) by mouth 2 (two) times daily as needed (Nausea or vomiting). 03/06/19   Nicholas Lose, MD  pantoprazole (PROTONIX) 40 MG tablet Take 1 tablet by mouth once daily 04/17/19   Nicholas Lose, MD  pravastatin (PRAVACHOL) 20 MG tablet Take 20 mg by mouth daily before breakfast.  01/03/18   [provider]  prochlorperazine (COMPAZINE) 10 MG tablet Take 1 tablet (10 mg total) by mouth every 6 (six) hours as needed (Nausea or vomiting). 03/06/19   Nicholas Lose, MD    Allergies    Levaquin [levofloxacin] and Codeine  Review of Systems   Review of Systems  Unable to perform ROS: Acuity of condition    Physical Exam Updated Vital Signs BP (!) 66/41   Pulse (!) 57   Resp 18   Wt 132 kg   SpO2 98%   BMI 46.97 kg/m   Physical Exam Vitals and  nursing note reviewed.  Constitutional:      General: She is in acute distress.     Appearance: She is well-developed. She is ill-appearing.     Comments: Morbidly obese  HENT:     Head: Normocephalic and atraumatic.     Right Ear: External ear normal.     Left Ear: External ear normal.  Eyes:     General: No scleral icterus.       Right eye: No discharge.        Left eye: No discharge.     Conjunctiva/sclera: Conjunctivae normal.  Neck:     Trachea: No tracheal deviation.  Cardiovascular:     Rate and Rhythm: Normal rate and regular rhythm.  Pulmonary:     Effort: Pulmonary effort is normal. No respiratory distress.     Breath sounds: Normal breath sounds. No stridor. No wheezing or rales.  Abdominal:     General: Bowel sounds are normal. There is no distension.     Palpations: Abdomen is soft.     Tenderness: There is no abdominal tenderness. There is no guarding or rebound.  Genitourinary:    Comments: Erythematous rash below the patient's pannus and in the perineum, some breakdown of the superficial tissue in the perineum and upper thighs Musculoskeletal:        General: Swelling present. No tenderness.     Cervical back: Neck supple. No rigidity.     Right lower leg: Edema present.     Left lower leg: Edema present.  Skin:    General: Skin is warm and dry.     Findings: No rash.  Neurological:     Mental Status: She is unresponsive.     GCS: GCS eye subscore is 3. GCS verbal subscore is 2. GCS motor subscore is 5.     Motor: No abnormal muscle tone or seizure activity.     Coordination: Coordination normal.     Comments: Patient is unable to comply with exam, she does not answer any questions, patient moans in response to interventions     ED Results / Procedures / Treatments   Labs (all labs ordered  are listed, but only abnormal results are displayed) Labs Reviewed  LACTIC ACID, PLASMA - Abnormal; Notable for the following components:      Result Value   Lactic  Acid, Venous >11 (*)    All other components within normal limits  COMPREHENSIVE METABOLIC PANEL - Abnormal; Notable for the following components:   Sodium 130 (*)    Potassium 5.7 (*)    CO2 <7 (*)    Glucose, Bld 123 (*)    BUN 68 (*)    Creatinine, Ser 3.64 (*)    Calcium 8.7 (*)    Total Protein 5.7 (*)    Albumin 2.3 (*)    AST 44 (*)    Alkaline Phosphatase 257 (*)    Total Bilirubin 1.5 (*)    GFR calc non Af Amer 12 (*)    GFR calc Af Amer 14 (*)    All other components within normal limits  CBC WITH DIFFERENTIAL/PLATELET - Abnormal; Notable for the following components:   WBC 23.4 (*)    RBC 3.15 (*)    Hemoglobin 7.3 (*)    HCT 27.1 (*)    MCH 23.2 (*)    MCHC 26.9 (*)    RDW 30.1 (*)    Platelets 129 (*)    Neutro Abs 21.9 (*)    Lymphs Abs 0.6 (*)    Abs Immature Granulocytes 0.19 (*)    All other components within normal limits  APTT - Abnormal; Notable for the following components:   aPTT 46 (*)    All other components within normal limits  PROTIME-INR - Abnormal; Notable for the following components:   Prothrombin Time 27.1 (*)    INR 2.5 (*)    All other components within normal limits  BLOOD GAS, ARTERIAL - Abnormal; Notable for the following components:   pH, Arterial 6.962 (*)    pCO2 arterial 24.1 (*)    pO2, Arterial 151 (*)    Bicarbonate 6.5 (*)    Acid-base deficit 24.2 (*)    All other components within normal limits  CULTURE, BLOOD (ROUTINE X 2)  CULTURE, BLOOD (ROUTINE X 2)  URINE CULTURE  LACTIC ACID, PLASMA  URINALYSIS, ROUTINE W REFLEX MICROSCOPIC  POC SARS CORONAVIRUS 2 AG -  ED    EKG None  Radiology DG Chest 2 View  Result Date: 07/23/2019 CLINICAL DATA:  Chronic diastolic heart failure. EXAM: CHEST - 2 VIEW COMPARISON:  Chest x-ray dated 07/12/2019 and chest CT dated 07/09/2019 FINDINGS: Power port in place in good position above the cavoatrial junction, unchanged. The pulmonary vascular congestion seen on the prior study  has resolved. There are small residual bilateral pleural effusions. No acute bone abnormality. IMPRESSION: Resolution of pulmonary vascular congestion. Small residual pleural effusions. Electronically Signed   By: Lorriane Shire M.D.   On: 07/23/2019 10:36   DG Chest Port 1 View  Result Date: Aug 18, 2019 CLINICAL DATA:  Weakness, altered level of consciousness EXAM: PORTABLE CHEST 1 VIEW COMPARISON:  07/23/2019 FINDINGS: Cardiomegaly, vascular congestion. Low lung volumes. No confluent opacities, effusions or edema. No acute bony abnormality. IMPRESSION: Cardiomegaly, vascular congestion. Electronically Signed   By: Rolm Baptise M.D.   On: 08-18-2019 10:52    Procedures .Critical Care Performed by: Dorie Rank, MD Authorized by: Dorie Rank, MD   Critical care provider statement:    Critical care time (minutes):  65   Critical care was time spent personally by me on the following activities:  Discussions with consultants, evaluation of  patient's response to treatment, examination of patient, ordering and performing treatments and interventions, ordering and review of laboratory studies, ordering and review of radiographic studies, pulse oximetry, re-evaluation of patient's condition, obtaining history from patient or surrogate and review of old charts   (including critical care time)  Medications Ordered in ED Medications  vancomycin (VANCOREADY) IVPB 2000 mg/400 mL (2,000 mg Intravenous New Bag/Given 07-29-19 1242)  norepinephrine (LEVOPHED) 58m in 2577mpremix infusion (5 mcg/min Intravenous New Bag/Given 2/02-23-21231)  ceFEPIme (MAXIPIME) 2 g in sodium chloride 0.9 % 100 mL IVPB (has no administration in time range)  vancomycin (VANCOREADY) IVPB 1500 mg/300 mL (has no administration in time range)  phytonadione (VITAMIN K) 10 mg in dextrose 5 % 50 mL IVPB (has no administration in time range)  sodium bicarbonate injection 50 mEq (has no administration in time range)  sodium bicarbonate 150  mEq in dextrose 5% 1000 mL infusion (has no administration in time range)  sodium chloride 0.9 % bolus 1,000 mL (0 mLs Intravenous Stopped 07/2019-02-23149)    And  sodium chloride 0.9 % bolus 800 mL (800 mLs Intravenous New Bag/Given 2/23-Feb-2021149)  ceFEPIme (MAXIPIME) 2 g in sodium chloride 0.9 % 100 mL IVPB (0 g Intravenous Stopped 07/2019/02/23217)  metroNIDAZOLE (FLAGYL) IVPB 500 mg (0 mg Intravenous Stopped 07/07/21/2021238)  sodium chloride 0.9 % bolus 1,000 mL (1,000 mLs Intravenous New Bag/Given 07/07/21/2021158)  sodium bicarbonate injection 50 mEq (50 mEq Intravenous Given 2/February 23, 2021239)    ED Course  I have reviewed the triage vital signs and the nursing notes.  Pertinent labs & imaging results that were available during my care of the patient were reviewed by me and considered in my medical decision making (see chart for details).  Clinical Course as of Jul 25 703  Thu Jul 24, 2019  1031 Patient notably hypotensive.  Symptoms concerning for possible sepsis.  Sepsis protocol initiated.  Weight based on IBW.  Pt morbidly obese with BMI >35   [JK]  1126iscussed diagnosed with daughter.  Vitals reviewed.  Patient remains hypotensive.  Despite Foley catheter, minimal urine output she is also hypothermic.  Bear hugger initiated.   [J[KG]  8185abs reviewed.  Notable for renal failure with elevated anion gap metabolic acidosis.   Could be combination of uremia   [JK]  1130 Lactic acid level severely elevated.  CXR with vascular congestion    [JK]  116314rior metabolic panel reviewed.  CR was 2.3 on 2/16.  2/8 was 0.94.   [JK]  1152 Leukocytosis also noted to be elevated over last couple of days.   [JK]  1153 LFTs chronically elevated.   [J[HF]  0263atient remains hypotensive.  Patient has received fluid boluses.  Additional fluid boluses ordered.  Sodium bicarb and Levophed infusion ordered.   [J[ZC]  5885indings discussed with daughter.  Patient remains full code.   [JK]  1213 ABG  reviewed.  Consistent with severe metabolic acidosis.  Sodium bicarb previously ordered   [JK]  12Crowleyiscussed with Dr AlEartha Inch Will continue to titrate levofed.  Will give additional bicarb and start infusion.     [JK]    Clinical Course User Index [JK] KnDorie RankMD   MDM Rules/Calculators/A&P                      Patient presents with signs of sepsis and septic shock.  Patient has acute renal failure with a severe anion  gap metabolic acidosis.  Records indicate this laboratory abnormality 2 days ago at the nursing facility.  Patient has severe lactic acidosis.  She is hypothermic.  Etiology of the infection is unclear.  She has been started on broad-spectrum antibiotics.  Patient also has been given supplemental sodium bicarbonate, and also has been started on pressors.  Patient will require ICU care.    Final Clinical Impression(s) / ED Diagnoses Final diagnoses:  Sepsis with acute renal failure and septic shock, due to unspecified organism, unspecified acute renal failure type [facility])      Dorie Rank, MD 19-Aug-2019 1253    Dorie Rank, MD 07/25/19 [phone]

## 2019-08-04 NOTE — Progress Notes (Signed)
Pharmacy Antibiotic Note  SHANYIAH CONDE is a 74 y.o. female admitted on 08-07-19 with unknown source of infection.  Pharmacy has been consulted for Vancomycin and Cefepime dosing.  Plan: Vancomycin 2000 mg IV x 1 dose. Vancomycin 1500 mg IV every 48 hours.  Goal trough 15-20 mcg/mL.  Cefepime 2000 mg IV every 24 hours. Monitor labs, c/s, and vanco level as indicated.  Weight: 291 lb 0.1 oz (132 kg)  No data recorded.  Recent Labs  Lab 07/22/19 1127 07/23/19 0758 2019-08-07 0817 2019-08-07 1042  WBC  --  18.9* 22.3* 23.4*  CREATININE 3.30* 3.40* 3.55* 3.64*  LATICACIDVEN  --   --   --  >11*    Estimated Creatinine Clearance: 19.8 mL/min (A) (by C-G formula based on SCr of 3.64 mg/dL (H)).    Allergies  Allergen Reactions  . Levaquin [Levofloxacin] Other (See Comments)    Joint "seize" and constipation  . Codeine Other (See Comments)    Severe headache    Antimicrobials this admission: Vanco 2/18 >>  Cefepime 2/18 >>   Dose adjustments this admission: Vanco/Cefepime  Microbiology results: 2/18 BCx: pending 2/18 UCx: pending    Thank you for allowing pharmacy to be a part of this patient's care.  Ramond Craver 2019/08/07 12:27 PM

## 2019-08-04 NOTE — ED Triage Notes (Signed)
Pt here from [facility]. Per staff, Creat 3.5, K 5.8, BP 71 systolic. Active breast cancer receiving treatments. CO2 7. Pt has been declining for the last few days. Pt is normally alert and oriented, but now is just vocalizing pain.

## 2019-08-04 NOTE — ED Provider Notes (Addendum)
Signout note  74 year old lady past medical history of breast cancer s/p chemotherapy, A. fib, hypertension, diabetes, recent hospitalization for sepsis secondary to cellulitis presented to ER with altered mental status.  Work-up very concerning thus far.  Appears to be in septic shock, profound lactic acidosis, metabolic acidosis, pH 6.9, leukocytosis.  Received large fluid bolus, broad-spectrum antibiotics.  Started on Levophed.  Discussed with critical care at [facility], Dr. Elsworth Soho who will accept.  Will need CRRT. Full code per family at this time.   4:00 PM received sign out from Dr. Tomi Bamberger, adding vasopressin, awaiting transport to [facility]; poor mental status but arouses to voice, some verbal response, follows some commands; bedside handoff, discussed CODE STATUS, okay for full code but may consider changing  5:00 PM reviewed repeat blood gas, worsening acidosis, not compensating appropriately, mental status declining, transport arrived, discussed with Dr. Elsworth Soho, we are in agreement patient needs to be intubated at this time prior to transport given worsening condition.  Lengthy discussion regarding goals of care, CODE STATUS with family -daughter Eustaquio Maize and son at bedside.  Reviewed concern for poor prognosis, particularly given worsening ABG, worsening mental status, no urine output after receiving large fluid boluses.  Discussed with family need for CRRT, mechanical ventilation, pressors, central line, dialysis line if family wishes to continue aggressive care.  Daughter, who is POA does not want further escalation of care and furthermore wants to transition to comfort measures only.  Plan:  DNR/DNI updated in chart Will dc pressors  Prn morphine and ativan If patient survives after taking off pressor support, then consult hosp for palliative admission   Lucrezia Starch, MD Aug 09, 2019 Bosie Helper     ADDENDUM - Time of Death 2130/08/15 - on reassessment, patient has no respirations, no palpable pulse,  no heart sounds on ascultation    Lucrezia Starch, MD 09-Aug-2019 08/16/2135

## 2019-08-04 NOTE — ED Notes (Signed)
Pt passed away at 2132. Confirmed by Dr. Roslynn Amble by heart and lung auscultation. Family at bedside.

## 2019-08-04 NOTE — ED Notes (Signed)
O2 decreased to 2L/M via n/c.

## 2019-08-04 NOTE — ED Notes (Signed)
No urine noted in foley, only trace noted in tubing.

## 2019-08-04 NOTE — Progress Notes (Signed)
Notified provider of need to order fluid bolus. Thank you Dr. Tomi Bamberger for your quick response and explanation.

## 2019-08-04 NOTE — ED Notes (Signed)
Still no urine output.

## 2019-08-04 NOTE — Progress Notes (Signed)
Notified bedside nurse of need to draw repeat lactic acid. 

## 2019-08-04 NOTE — ED Notes (Signed)
CRITICAL VALUE ALERT  Critical Value:  [facility] 6.962  Date & Time Notied:  1212, 2019-07-30  Provider Notified: Dr. Tomi Bamberger  Orders Received/Actions taken: no new orders received

## 2019-08-04 NOTE — ED Notes (Signed)
Cap refill >3  Blue tip to left great toe  2 small scars to left second toe  Distal to 3rd toe, scarring Lateral left pinky toe  Extreme breakdown to groin Left elbow dressing

## 2019-08-04 DEATH — deceased

## 2019-08-07 ENCOUNTER — Telehealth: Payer: Self-pay | Admitting: *Deleted

## 2019-08-07 NOTE — Telephone Encounter (Signed)
Destiny Nash POA left message wanting results of [place] because never received those.

## 2019-08-08 ENCOUNTER — Other Ambulatory Visit: Payer: Medicare Other

## 2019-08-08 ENCOUNTER — Ambulatory Visit: Payer: Medicare Other

## 2019-08-08 ENCOUNTER — Ambulatory Visit: Payer: Medicare Other | Admitting: Hematology and Oncology

## 2019-08-08 NOTE — Telephone Encounter (Signed)
Thank you :)

## 2019-08-08 NOTE — Telephone Encounter (Signed)
Per chart review ultrasound not performed. Patient passed prior to being done.

## 2019-08-08 NOTE — Telephone Encounter (Signed)
Called daughter and informed her that ultrasound had not been completed, per chart review.

## 2019-08-11 ENCOUNTER — Encounter: Payer: Self-pay | Admitting: *Deleted

## 2019-08-15 ENCOUNTER — Other Ambulatory Visit: Payer: Medicare Other

## 2019-08-15 ENCOUNTER — Ambulatory Visit: Payer: Medicare Other | Admitting: Hematology and Oncology

## 2019-08-15 ENCOUNTER — Ambulatory Visit: Payer: Medicare Other

## 2019-08-19 ENCOUNTER — Ambulatory Visit: Payer: Medicare Other | Admitting: Cardiology

## 2019-08-29 ENCOUNTER — Other Ambulatory Visit: Payer: Medicare Other

## 2019-08-29 ENCOUNTER — Ambulatory Visit: Payer: Medicare Other | Admitting: Hematology and Oncology

## 2019-08-29 ENCOUNTER — Ambulatory Visit: Payer: Medicare Other

## 2019-09-05 ENCOUNTER — Other Ambulatory Visit: Payer: Medicare Other

## 2019-09-05 ENCOUNTER — Ambulatory Visit: Payer: Medicare Other

## 2021-06-21 IMAGING — MG NEEDLE LOCALIZATION OF THE LEFT BREAST WITH MAMMO GUIDANCE
7 series · 7 of 7 positions shown · non-contrast
Comparison: Previous exam(s).

CLINICAL DATA: 72-year-old female presenting for radioactive seed
localization of the left breast prior to lumpectomy.

EXAM:
MAMMOGRAPHIC GUIDED RADIOACTIVE SEED LOCALIZATION OF THE LEFT BREAST

[L LM (1 of 3)]
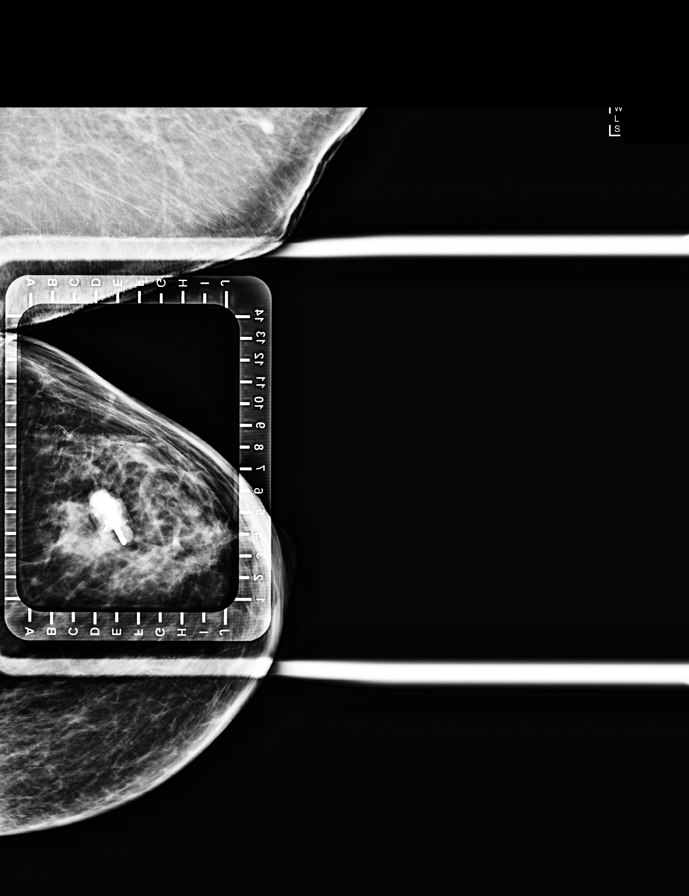

[L LM (2 of 3)]
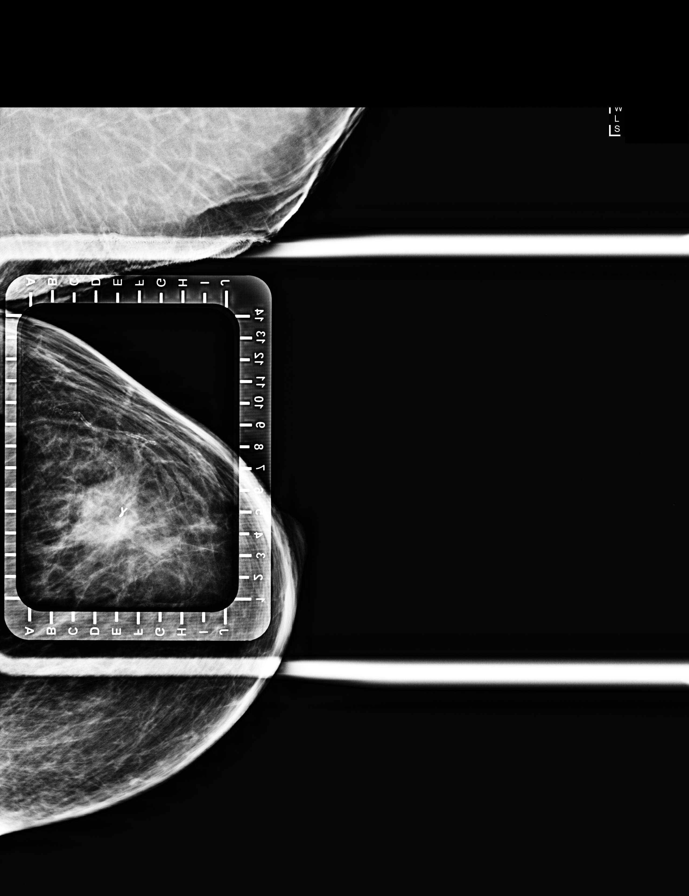

[L LM (3 of 3)]
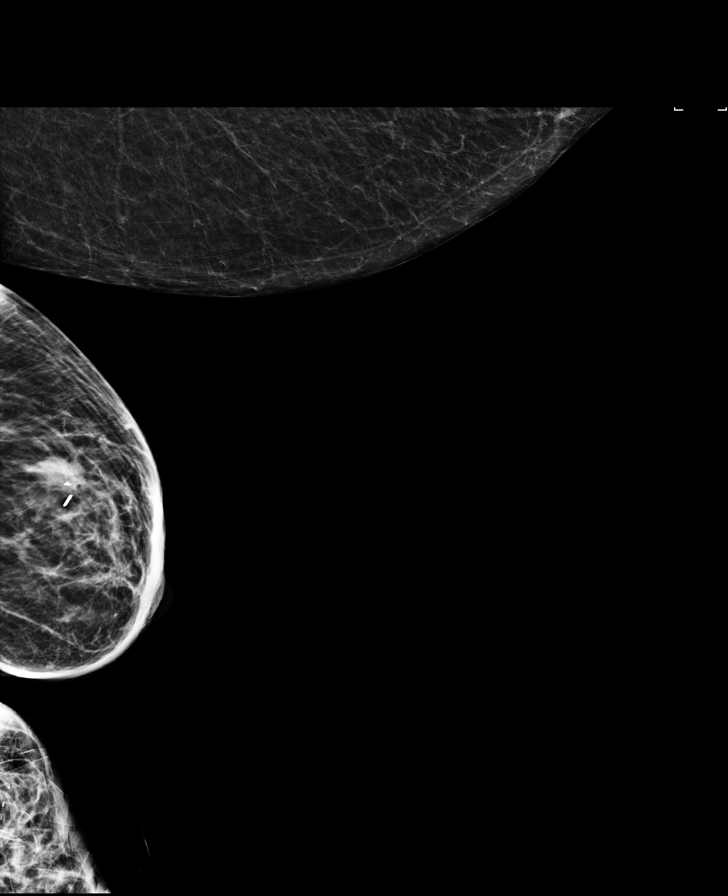

[L CC (1 of 4)]
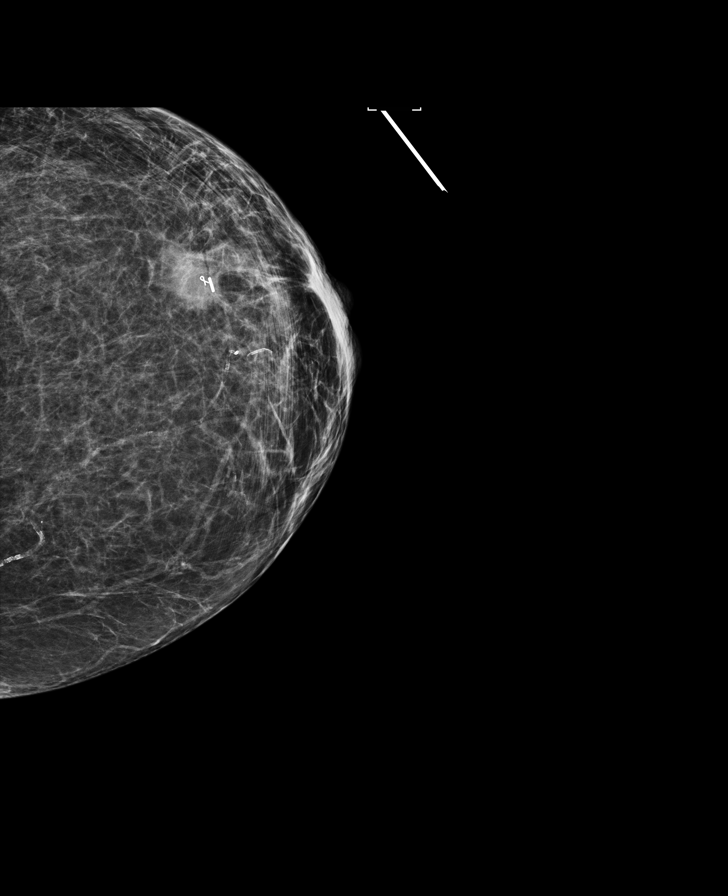

[L CC (2 of 4)]
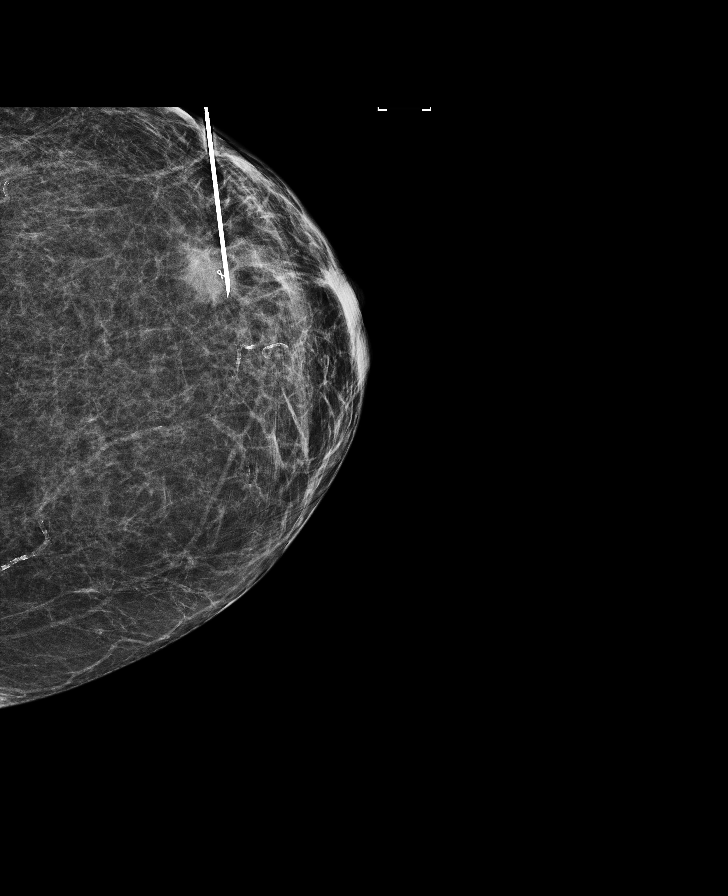

[L CC (3 of 4)]
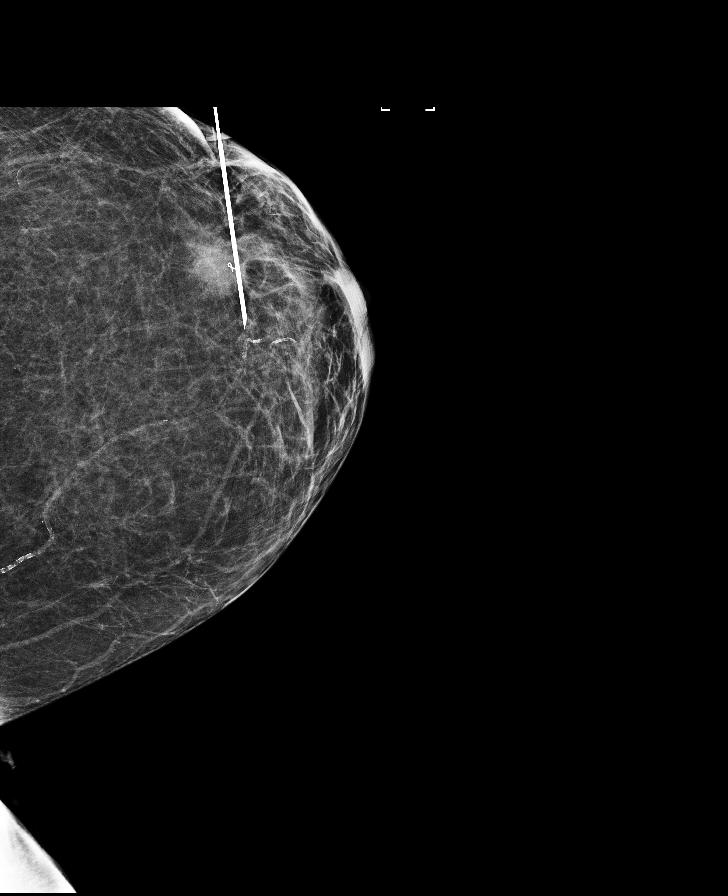

[L CC (4 of 4)]
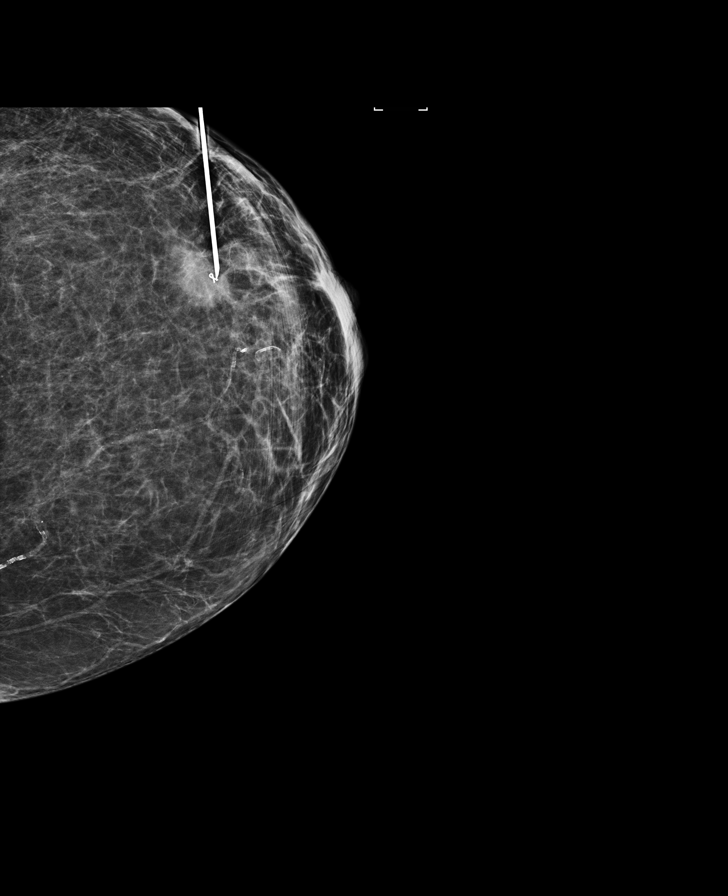

[7 of 7 positions shown; findings below may reference images not displayed]

FINDINGS: Patient presents for radioactive seed localization prior to left
breast lumpectomy. I met with the patient and we discussed the
procedure of seed localization including benefits and alternatives.
We discussed the high likelihood of a successful procedure. We
discussed the risks of the procedure including infection, bleeding,
tissue injury and further surgery. We discussed the low dose of
radioactivity involved in the procedure. Informed, written consent
was given.

The usual time-out protocol was performed immediately prior to the
procedure.

Using mammographic guidance, sterile technique, 1% lidocaine and an
U-RIP radioactive seed, the ribbon shaped biopsy marking clip with
in the left breast mass in the upper-outer quadrant was localized
using a lateral approach. The follow-up mammogram images confirm the
seed in the expected location and were marked for Dr. Sayed Hamed.

Follow-up survey of the patient confirms presence of the radioactive
seed.

Order number of U-RIP seed:  [id number].

Total activity:  0.250 millicuries reference Date: 11/19/2018

The patient tolerated the procedure well and was released from the
[REDACTED]. She was given instructions regarding seed removal.
IMPRESSION: Radioactive seed localization left breast. No apparent
complications.
